# Patient Record
Sex: Male | Born: 1940
Health system: Southern US, Community
[De-identification: ages and names within clinical notes are randomized; demographics above are authoritative.]

## PROBLEM LIST (undated history)

## (undated) DIAGNOSIS — J449 Chronic obstructive pulmonary disease, unspecified: Secondary | ICD-10-CM

## (undated) DIAGNOSIS — I472 Ventricular tachycardia: Secondary | ICD-10-CM

## (undated) DIAGNOSIS — Z95810 Presence of automatic (implantable) cardiac defibrillator: Secondary | ICD-10-CM

## (undated) DIAGNOSIS — E785 Hyperlipidemia, unspecified: Secondary | ICD-10-CM

## (undated) DIAGNOSIS — E1122 Type 2 diabetes mellitus with diabetic chronic kidney disease: Secondary | ICD-10-CM

## (undated) DIAGNOSIS — I252 Old myocardial infarction: Secondary | ICD-10-CM

## (undated) DIAGNOSIS — N529 Male erectile dysfunction, unspecified: Secondary | ICD-10-CM

## (undated) DIAGNOSIS — I10 Essential (primary) hypertension: Secondary | ICD-10-CM

## (undated) DIAGNOSIS — I639 Cerebral infarction, unspecified: Secondary | ICD-10-CM

## (undated) DIAGNOSIS — I480 Paroxysmal atrial fibrillation: Secondary | ICD-10-CM

## (undated) DIAGNOSIS — I5023 Acute on chronic systolic (congestive) heart failure: Secondary | ICD-10-CM

## (undated) DIAGNOSIS — I2510 Atherosclerotic heart disease of native coronary artery without angina pectoris: Secondary | ICD-10-CM

## (undated) DIAGNOSIS — N183 Chronic kidney disease, stage 3 unspecified: Secondary | ICD-10-CM

## (undated) HISTORY — DX: Hyperlipidemia, unspecified: E78.5

## (undated) HISTORY — DX: Presence of automatic (implantable) cardiac defibrillator: Z95.810

## (undated) HISTORY — DX: Cerebral infarction, unspecified: I63.9

## (undated) HISTORY — PX: CHOLECYSTECTOMY: SHX55

## (undated) HISTORY — DX: Chronic kidney disease, stage 3 (moderate): N18.3

## (undated) HISTORY — DX: Old myocardial infarction: I25.2

## (undated) HISTORY — PX: KNEE ARTHROSCOPY: SUR90

## (undated) HISTORY — DX: Paroxysmal atrial fibrillation: I48.0

## (undated) HISTORY — PX: EYE SURGERY: SHX253

## (undated) HISTORY — DX: Chronic obstructive pulmonary disease, unspecified: J44.9

## (undated) HISTORY — PX: BYPASS GRAFT: SHX909

## (undated) HISTORY — DX: Type 2 diabetes mellitus with diabetic chronic kidney disease: E11.22

## (undated) HISTORY — DX: Ventricular tachycardia: I47.2

## (undated) HISTORY — PX: CORONARY ARTERY BYPASS GRAFT: SHX141

## (undated) HISTORY — DX: Essential (primary) hypertension: I10

## (undated) HISTORY — PX: HERNIA REPAIR: SHX51

## (undated) HISTORY — DX: Male erectile dysfunction, unspecified: N52.9

## (undated) HISTORY — DX: Atherosclerotic heart disease of native coronary artery without angina pectoris: I25.10

## (undated) HISTORY — PX: UMBILICAL HERNIA REPAIR: SHX196

## (undated) HISTORY — DX: Acute on chronic systolic (congestive) heart failure: I50.23

## (undated) HISTORY — DX: Chronic kidney disease, stage 3 unspecified: N18.30

## (undated) HISTORY — PX: OTHER SURGICAL HISTORY: SHX169

---

## 2001-12-05 ENCOUNTER — Encounter: Payer: Self-pay | Admitting: Thoracic Surgery (Cardiothoracic Vascular Surgery)

## 2001-12-09 ENCOUNTER — Encounter: Payer: Self-pay | Admitting: Thoracic Surgery (Cardiothoracic Vascular Surgery)

## 2001-12-09 ENCOUNTER — Inpatient Hospital Stay (HOSPITAL_COMMUNITY)
Admission: RE | Admit: 2001-12-09 | Discharge: 2001-12-22 | Payer: Self-pay | Admitting: Thoracic Surgery (Cardiothoracic Vascular Surgery)

## 2001-12-10 ENCOUNTER — Encounter: Payer: Self-pay | Admitting: Thoracic Surgery (Cardiothoracic Vascular Surgery)

## 2001-12-11 ENCOUNTER — Encounter: Payer: Self-pay | Admitting: Thoracic Surgery (Cardiothoracic Vascular Surgery)

## 2001-12-15 ENCOUNTER — Encounter: Payer: Self-pay | Admitting: Thoracic Surgery (Cardiothoracic Vascular Surgery)

## 2001-12-17 ENCOUNTER — Encounter: Payer: Self-pay | Admitting: Thoracic Surgery (Cardiothoracic Vascular Surgery)

## 2002-01-12 ENCOUNTER — Encounter
Admission: RE | Admit: 2002-01-12 | Discharge: 2002-01-12 | Payer: Self-pay | Admitting: Thoracic Surgery (Cardiothoracic Vascular Surgery)

## 2002-01-12 ENCOUNTER — Encounter: Payer: Self-pay | Admitting: Thoracic Surgery (Cardiothoracic Vascular Surgery)

## 2006-01-08 HISTORY — PX: CARDIAC DEFIBRILLATOR PLACEMENT: SHX171

## 2006-01-29 ENCOUNTER — Ambulatory Visit (HOSPITAL_COMMUNITY): Admission: RE | Admit: 2006-01-29 | Discharge: 2006-01-30 | Payer: Self-pay | Admitting: Orthopedic Surgery

## 2006-12-23 HISTORY — PX: COLONOSCOPY: SHX174

## 2010-10-23 ENCOUNTER — Ambulatory Visit (INDEPENDENT_AMBULATORY_CARE_PROVIDER_SITE_OTHER): Payer: Medicare HMO | Admitting: Cardiology

## 2010-10-23 ENCOUNTER — Encounter: Payer: Self-pay | Admitting: Cardiology

## 2010-10-23 VITALS — BP 132/74 | HR 63 | Ht 70.0 in | Wt 214.8 lb

## 2010-10-23 DIAGNOSIS — I5023 Acute on chronic systolic (congestive) heart failure: Secondary | ICD-10-CM

## 2010-10-23 DIAGNOSIS — I252 Old myocardial infarction: Secondary | ICD-10-CM | POA: Insufficient documentation

## 2010-10-23 DIAGNOSIS — I2510 Atherosclerotic heart disease of native coronary artery without angina pectoris: Secondary | ICD-10-CM

## 2010-10-23 DIAGNOSIS — Z95810 Presence of automatic (implantable) cardiac defibrillator: Secondary | ICD-10-CM | POA: Insufficient documentation

## 2010-10-23 DIAGNOSIS — E1121 Type 2 diabetes mellitus with diabetic nephropathy: Secondary | ICD-10-CM | POA: Insufficient documentation

## 2010-10-23 DIAGNOSIS — I509 Heart failure, unspecified: Secondary | ICD-10-CM

## 2010-10-23 DIAGNOSIS — I10 Essential (primary) hypertension: Secondary | ICD-10-CM | POA: Insufficient documentation

## 2010-10-23 DIAGNOSIS — E119 Type 2 diabetes mellitus without complications: Secondary | ICD-10-CM

## 2010-10-23 MED ORDER — LISINOPRIL-HYDROCHLOROTHIAZIDE 10-12.5 MG PO TABS: 1.0000 | ORAL_TABLET | Freq: Every day | ORAL | Status: DC

## 2010-10-23 MED ORDER — OMEPRAZOLE 20 MG PO CPDR: 20.0000 mg | DELAYED_RELEASE_CAPSULE | Freq: Every day | ORAL | Status: DC

## 2010-10-23 MED ORDER — FUROSEMIDE 40 MG PO TABS: 40.0000 mg | ORAL_TABLET | Freq: Two times a day (BID) | ORAL | Status: DC

## 2010-10-23 MED ORDER — CARVEDILOL 12.5 MG PO TABS: 12.5000 mg | ORAL_TABLET | Freq: Two times a day (BID) | ORAL | Status: DC

## 2010-10-23 NOTE — Assessment & Plan Note (Signed)
His ICD was implanted on 12/15/2007. 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 Since he is planning to have his cardiology followup here we will establish him in our ICD clinic. It appears that this is a dual-chamber device without biventricular pacing.

## 2010-10-23 NOTE — Progress Notes (Signed)
Nathaniel Bolton Date of Birth: 1940-03-31   History of Present Illness: Nathaniel Bolton is seen at the request of Dr. Tobie Poet for evaluation of increased dyspnea. He is a 70 year old white male with a complex cardiac history. He reports a history of 2 prior myocardial infarctions in 1980 and 1990. He underwent coronary bypass surgery x5 approximately 9 years ago by Dr. Ricard Dillon. He is status post ICD implant in December of 2009 for prophylaxis. He reports an ejection fraction of 25-30%. He formerly was followed by Dr. Woody Seller in [place] but has not had regular cardiac followup. His ICD has been followed by Dr. Elonda Husky in [facility]. Over the past 4-5 months he has had symptoms of increased shortness of breath with exertion. Patient really downplays these symptoms but his wife reports significant increase in shortness of breath and lower extremity edema. He has had increased difficulty doing yard work and has to stop frequently. He denies any orthopnea or PND. He denies any chest pain or palpitations. He's had no defibrillator discharges. He has checked his oxygen levels at home and they have been anywhere from 90-95%.  Current Outpatient Prescriptions on File Prior to Visit  Medication Sig Dispense Refill  . aspirin 81 MG tablet Take 81 mg by mouth daily.        . Cholecalciferol (VITAMIN D PO) Take by mouth.        Marland Kitchen glipiZIDE (GLUCOTROL) 10 MG tablet Take 10 mg by mouth 2 (two) times daily before a meal.        . insulin aspart protamine-insulin aspart (NOVOLOG 70/30) (70-30) 100 UNIT/ML injection Inject into the skin.        Marland Kitchen isosorbide mononitrate (IMDUR) 30 MG 24 hr tablet Take 1 tablet by mouth Daily.      . Omega-3 Fatty Acids (FISH OIL PO) Take by mouth.        . Saw Palmetto, Serenoa repens, (SAW PALMETTO PO) Take by mouth.        . simvastatin (ZOCOR) 40 MG tablet Take 40 mg by mouth at bedtime.        . Vitamin D, Ergocalciferol, (DRISDOL) 50000 UNITS CAPS Take 1 tablet by mouth Daily.      Marland Kitchen  VITAMIN E PO Take by mouth.        . carvedilol (COREG) 12.5 MG tablet Take 1 tablet (12.5 mg total) by mouth 2 (two) times daily.  30 tablet  11  . lisinopril-hydrochlorothiazide (PRINZIDE,ZESTORETIC) 10-12.5 MG per tablet Take 1 tablet by mouth daily.      Marland Kitchen omeprazole (PRILOSEC) 20 MG capsule Take 1 capsule (20 mg total) by mouth daily.        Allergies  Allergen Reactions  . Adhesive (Tape)     Testosterone patch adhesive    Past Medical History  Diagnosis Date  . Heart attack     x2  . Diabetes mellitus     Type 2  . Hypertension   . CAD (coronary artery disease)   . Erectile dysfunction   . Gout   . CHF (congestive heart failure)     Past Surgical History  Procedure Date  . Bypass graft     x5  . Cardiac defibrillator placement 2008  . Cholecystectomy   . Knee arthroscopy     right  . Umbilical hernia repair   . Cataract surgery     History  Smoking status  . Former Smoker  Smokeless tobacco  . Not on file  History  Alcohol Use No    Family History  Problem Relation Age of Onset  . Heart failure Mother   . Heart attack Father   . Heart disease Sister     valve replaced  . Heart attack Brother     CABG, aortic grafting    Review of Systems: The review of systems is positive for  Increased weight gain of approximately 10 pounds. He quit smoking in 1992.  He has no history of stroke or TIA. He has no history of bleeding problems. All other systems were reviewed and are negative.  Physical Exam: BP 132/74  Pulse 63  Ht 5\' 10"  (1.778 m)  Wt 214 lb 12.8 oz (97.433 kg)  BMI 30.82 kg/m2 He is an obese white male in no acute distress. He is normocephalic, atraumatic. Pupils are equal round and reactive to light and accommodation. Sclera are clear. Oropharynx is clear. Neck is supple without adenopathy or thyromegaly. He has mild jugular venous distention. Carotid upstrokes are normal without bruits. Lungs reveal mild basilar rales on the left.  Cardiovascular exam reveals a regular rate and rhythm with normal S1 and S2. There are no gallops, murmurs, or clicks. PMI is normal. He has a median sternotomy scar. Abdomen is obese, soft, nontender. He has no masses organosplenomegaly. His femoral and pedal pulses are palpable. He has 1-2+ edema left greater than right. He has a scar on his left leg from vein graft harvest. He also has a scar in his left wrist from a radial artery graft. He is alert and oriented x3. Cranial nerves II through XII are intact. He has no focal motor or sensory deficits. LABORATORY DATA: ECG demonstrates normal sinus rhythm with evidence of old inferior infarction. There is T wave inversion in the lateral leads consistent with ischemia. Prior pulmonary function studies were reported as showing moderate obstructive lung disease. Recent lab work is pending.  Assessment / Plan:

## 2010-10-23 NOTE — Assessment & Plan Note (Signed)
He has signs and symptoms of congestive heart failure with increased weight gain, edema, and dyspnea. We will increase his Lasix to 40 mg twice daily. I've recommended sodium restriction of less than 2 g per day. We will obtain an echocardiogram. We will followup on his recent laboratory data. I will have him return in 2 weeks to assess his response to diuretics and we will repeat a basic metabolic panel and BNP level at that time. He apparently has a history of elevated potassium which would negate our use of Aldactone.

## 2010-10-23 NOTE — Assessment & Plan Note (Signed)
He is status post CABG approximately 9 years ago. We will need to obtain records from his cardiac catheterization and bypass at that time. This apparently was done at [facility]. Given his recent symptoms of increased dyspnea on exertion we need to rule out anginal equivalent symptoms and we'll schedule him for a lexiscan Myoview study.

## 2010-10-23 NOTE — Patient Instructions (Signed)
You need to restrict salt in your diet.  We will increase you Lasix to 40 mg twice a day.  Continue your other medications.  We will schedule you for an echocardiogram and a nuclear stress test.  We will see you again in 2 weeks with blood work (BMET, BNP).  We will call for the results of your lab work from Dr. Tobie Poet  We will schedule follow up in our ICD clinic.

## 2010-10-30 ENCOUNTER — Encounter: Payer: Self-pay | Admitting: *Deleted

## 2010-11-02 ENCOUNTER — Ambulatory Visit (HOSPITAL_BASED_OUTPATIENT_CLINIC_OR_DEPARTMENT_OTHER): Payer: Medicare HMO | Admitting: Radiology

## 2010-11-02 ENCOUNTER — Ambulatory Visit (INDEPENDENT_AMBULATORY_CARE_PROVIDER_SITE_OTHER): Payer: Medicare HMO | Admitting: *Deleted

## 2010-11-02 ENCOUNTER — Encounter: Payer: Self-pay | Admitting: Internal Medicine

## 2010-11-02 ENCOUNTER — Ambulatory Visit (HOSPITAL_COMMUNITY): Payer: Medicare HMO | Attending: Cardiology | Admitting: Radiology

## 2010-11-02 VITALS — Ht 70.0 in | Wt 213.0 lb

## 2010-11-02 DIAGNOSIS — J449 Chronic obstructive pulmonary disease, unspecified: Secondary | ICD-10-CM | POA: Insufficient documentation

## 2010-11-02 DIAGNOSIS — I509 Heart failure, unspecified: Secondary | ICD-10-CM | POA: Insufficient documentation

## 2010-11-02 DIAGNOSIS — I2510 Atherosclerotic heart disease of native coronary artery without angina pectoris: Secondary | ICD-10-CM | POA: Insufficient documentation

## 2010-11-02 DIAGNOSIS — R0989 Other specified symptoms and signs involving the circulatory and respiratory systems: Secondary | ICD-10-CM | POA: Insufficient documentation

## 2010-11-02 DIAGNOSIS — I252 Old myocardial infarction: Secondary | ICD-10-CM | POA: Insufficient documentation

## 2010-11-02 DIAGNOSIS — R0602 Shortness of breath: Secondary | ICD-10-CM

## 2010-11-02 DIAGNOSIS — J4489 Other specified chronic obstructive pulmonary disease: Secondary | ICD-10-CM | POA: Insufficient documentation

## 2010-11-02 DIAGNOSIS — Z95810 Presence of automatic (implantable) cardiac defibrillator: Secondary | ICD-10-CM

## 2010-11-02 DIAGNOSIS — I5023 Acute on chronic systolic (congestive) heart failure: Secondary | ICD-10-CM

## 2010-11-02 DIAGNOSIS — R0609 Other forms of dyspnea: Secondary | ICD-10-CM | POA: Insufficient documentation

## 2010-11-02 DIAGNOSIS — Z87891 Personal history of nicotine dependence: Secondary | ICD-10-CM | POA: Insufficient documentation

## 2010-11-02 DIAGNOSIS — I428 Other cardiomyopathies: Secondary | ICD-10-CM

## 2010-11-02 DIAGNOSIS — I059 Rheumatic mitral valve disease, unspecified: Secondary | ICD-10-CM | POA: Insufficient documentation

## 2010-11-02 DIAGNOSIS — E119 Type 2 diabetes mellitus without complications: Secondary | ICD-10-CM | POA: Insufficient documentation

## 2010-11-02 DIAGNOSIS — I10 Essential (primary) hypertension: Secondary | ICD-10-CM | POA: Insufficient documentation

## 2010-11-02 DIAGNOSIS — I25810 Atherosclerosis of coronary artery bypass graft(s) without angina pectoris: Secondary | ICD-10-CM

## 2010-11-02 LAB — ICD DEVICE OBSERVATION
BATTERY VOLTAGE: 3.147 V
BRDY-0002RV: 40 {beats}/min
PACEART VT: 0
TOT-0006: 20091217000000
TZAT-0002SLOWVT: NEGATIVE
TZAT-0012FASTVT: 200 ms
TZAT-0018FASTVT: NEGATIVE
TZAT-0018SLOWVT: NEGATIVE
TZAT-0019FASTVT: 8 V
TZAT-0019SLOWVT: 8 V
TZAT-0020FASTVT: 1.5 ms
TZST-0001FASTVT: 5
TZST-0001SLOWVT: 3
TZST-0001SLOWVT: 4
TZST-0001SLOWVT: 5
TZST-0001SLOWVT: 6
TZST-0002FASTVT: NEGATIVE
TZST-0002FASTVT: NEGATIVE
TZST-0002SLOWVT: NEGATIVE
TZST-0002SLOWVT: NEGATIVE
VENTRICULAR PACING ICD: 0.08 pct

## 2010-11-02 MED ORDER — TECHNETIUM TC 99M TETROFOSMIN IV KIT
33.0000 | PACK | Freq: Once | INTRAVENOUS | Status: AC | PRN
  Administered 2010-11-02: 33 via INTRAVENOUS

## 2010-11-02 MED ORDER — TECHNETIUM TC 99M TETROFOSMIN IV KIT
11.0000 | PACK | Freq: Once | INTRAVENOUS | Status: AC | PRN
  Administered 2010-11-02: 11 via INTRAVENOUS

## 2010-11-02 MED ORDER — REGADENOSON 0.4 MG/5ML IV SOLN
0.4000 mg | Freq: Once | INTRAVENOUS | Status: AC
  Administered 2010-11-02: 0.4 mg via INTRAVENOUS

## 2010-11-02 NOTE — Progress Notes (Signed)
[place] [address] [phone]  Cardiology Nuclear Med Study  ADANTE HUSSER is a 70 y.o. male [id number] February 07, 1940   Nuclear Med Background Indication for Stress Test:  Evaluation for Ischemia and Graft Patency History: 12/03 CABGx5, 12/09 Defibrillator: ICM, '03 Heart Catheterization: EF 25% Severe 2V Dz and '80, '90 Myocardial Infarction Cardiac Risk Factors: Family History - CAD, History of Smoking, Hypertension and IDDM Type 2  Symptoms:  DOE, Fatigue with Exertion and SOB   Nuclear Pre-Procedure Caffeine/Decaff Intake:  None NPO After: 8:00pm   Lungs:  clear IV 0.9% NS with Angio Cath:  22g  IV Site: R Wrist  IV Started by:  Eliezer Lofts, EMT-P  Chest Size (in):  43 Cup Size: n/a  Height: 5\' 10"  (1.778 m)  Weight:  213 lb (96.616 kg)  BMI:  Body mass index is 30.56 kg/(m^2). Tech Comments:  Coreg held this am, per patient.    Nuclear Med Study 1 or 2 day study: 1 day  Stress Test Type:  Carlton Adam  Reading MD: Jenkins Rouge, MD  Order Authorizing Provider:  P.Jordan  Resting Radionuclide: Technetium 4m Tetrofosmin  Resting Radionuclide Dose: 11.0 mCi   Stress Radionuclide:  Technetium 39m Tetrofosmin  Stress Radionuclide Dose: 33.0 mCi           Stress Protocol Rest HR: 54 Stress HR: 70  Rest BP: 93/63 Stress BP: 108/70  Exercise Time (min): n/a METS: n/a   Predicted Max HR: 152 bpm % Max HR: 46.05 bpm Rate Pressure Product: 7560   Dose of Adenosine (mg):  n/a Dose of Lexiscan: 0.4 mg  Dose of Atropine (mg): n/a Dose of Dobutamine: n/a mcg/kg/min (at max HR)  Stress Test Technologist: Perrin Maltese, EMT-P  Nuclear Technologist:  Charlton Amor, CNMT     Rest Procedure:  Myocardial perfusion imaging was performed at rest 45 minutes following the intravenous administration of Technetium 27m Tetrofosmin. Rest ECG: NSR  Stress Procedure:  The patient received IV Lexiscan 0.4 mg over  15-seconds.  Technetium 40m Tetrofosmin injected at 30-seconds.  There were no significant changes, sob, fatigue, and rare pvcs with Lexiscan.  Quantitative spect images were obtained after a 45 minute delay. Stress ECG: No significant change from baseline ECG  QPS Raw Data Images:  Patient motion noted. Stress Images:  There is decreased uptake in the inferior wall. Rest Images:  There is decreased uptake in the inferior wall. Subtraction (SDS):  There is a fixed defect that is most consistent with a previous infarction. Transient Ischemic Dilatation (Normal <1.22):  1.10 Lung/Heart Ratio (Normal <0.45):  0.40  Quantitative Gated Spect Images QGS EDV:  206 ml QGS ESV:  151 ml QGS cine images:  Inferior and lateral wall hypokinesis QGS EF: 27%  Impression Exercise Capacity:  Lexiscan with no exercise. BP Response:  Normal blood pressure response. Clinical Symptoms:  There is dyspnea. ECG Impression:  No significant ST segment change suggestive of ischemia. Comparison with Prior Nuclear Study: No images to compare  Overall Impression:  Large inferior and lateral wall infarcts from apex to base.  No ischeia  EF 27%     Jenkins Rouge

## 2010-11-02 NOTE — Progress Notes (Signed)
icd check in clinic  

## 2010-11-06 ENCOUNTER — Ambulatory Visit (INDEPENDENT_AMBULATORY_CARE_PROVIDER_SITE_OTHER): Payer: Medicare HMO | Admitting: Nurse Practitioner

## 2010-11-06 ENCOUNTER — Other Ambulatory Visit (INDEPENDENT_AMBULATORY_CARE_PROVIDER_SITE_OTHER): Payer: Medicare HMO | Admitting: *Deleted

## 2010-11-06 ENCOUNTER — Encounter: Payer: Self-pay | Admitting: Nurse Practitioner

## 2010-11-06 DIAGNOSIS — R0609 Other forms of dyspnea: Secondary | ICD-10-CM

## 2010-11-06 DIAGNOSIS — I5022 Chronic systolic (congestive) heart failure: Secondary | ICD-10-CM | POA: Insufficient documentation

## 2010-11-06 DIAGNOSIS — I2510 Atherosclerotic heart disease of native coronary artery without angina pectoris: Secondary | ICD-10-CM

## 2010-11-06 DIAGNOSIS — I5020 Unspecified systolic (congestive) heart failure: Secondary | ICD-10-CM

## 2010-11-06 DIAGNOSIS — I509 Heart failure, unspecified: Secondary | ICD-10-CM

## 2010-11-06 DIAGNOSIS — R0989 Other specified symptoms and signs involving the circulatory and respiratory systems: Secondary | ICD-10-CM

## 2010-11-06 LAB — BASIC METABOLIC PANEL
BUN: 37 mg/dL — ABNORMAL HIGH (ref 6–23)
Chloride: 97 mEq/L (ref 96–112)
Potassium: 3.8 mEq/L (ref 3.5–5.1)
Sodium: 135 mEq/L (ref 135–145)

## 2010-11-06 MED ORDER — LISINOPRIL 10 MG PO TABS: 10.0000 mg | ORAL_TABLET | Freq: Two times a day (BID) | ORAL | Status: DC

## 2010-11-06 NOTE — Assessment & Plan Note (Signed)
His Myoview shows large inferior and lateral wall infarcts from the apex to the base. No ischemia. EF is 27%. Echo shows no valvular disease but with EF of 25 to 30%. Symptoms are improved with diuresis.

## 2010-11-06 NOTE — Assessment & Plan Note (Signed)
Has an EF in the 25 to 30% range. I have stopped the Lisinopril Hct and placed him on Lisinopril 10 mg BID. We will be checking BMET and BNP today. I have left him on his Lasix BID. I will see him back in 3 months. Will try to continue with uptitration of his medicines. He has apparently had hyperkalemia in the past which may limit trying aldactone. He has also been using some potassium supplements as well. He is encouraged to weigh daily and continue with salt restriction. Patient is agreeable to this plan and will call if any problems develop in the interim.

## 2010-11-06 NOTE — Progress Notes (Signed)
Nathaniel Bolton Date of Birth: [birth year] Medical Record [id number]  History of Present Illness: Nathaniel Bolton is seen today for a follow up visit. He is seen for Dr. Martinique. He has a complex medical history with an ischemia cardiomyopathy, EF of 27%. He has his ICD in place. He has been placed in the device clinic here. He has had his myoview and echo updated. EF remains low. No ischemia on the myoview but with significant scar.   His Lasix was increased at his last visit. His wife says this has helped a lot. He tends to downplay his symptoms. She notes less shortness of breath and less edema. He has complained of some cramps and has used some potassium. He remains very active with doing yard work. No ICD shocks. No chest pain. He is trying to watch his salt.   Current Outpatient Prescriptions on File Prior to Visit  Medication Sig Dispense Refill  . aspirin 81 MG tablet Take 81 mg by mouth daily.        . carvedilol (COREG) 12.5 MG tablet Take 1 tablet (12.5 mg total) by mouth 2 (two) times daily.  30 tablet  11  . furosemide (LASIX) 40 MG tablet Take 1 tablet (40 mg total) by mouth 2 (two) times daily.  60 tablet  11  . glipiZIDE (GLUCOTROL) 10 MG tablet Take 10 mg by mouth 2 (two) times daily before a meal.        . insulin aspart protamine-insulin aspart (NOVOLOG 70/30) (70-30) 100 UNIT/ML injection Inject into the skin.        Marland Kitchen isosorbide mononitrate (IMDUR) 30 MG 24 hr tablet Take 1 tablet by mouth Daily.      . Multiple Vitamin (MULTIVITAMIN) tablet Take 1 tablet by mouth daily.        . Omega-3 Fatty Acids (FISH OIL PO) Take by mouth.        Marland Kitchen omeprazole (PRILOSEC) 20 MG capsule Take 1 capsule (20 mg total) by mouth daily.      . Potassium 99 MG TABS Take 1 tablet by mouth daily as needed.        . Saw Palmetto, Serenoa repens, (SAW PALMETTO PO) Take by mouth.        . simvastatin (ZOCOR) 80 MG tablet Take 80 mg by mouth at bedtime.        . Vitamin D, Ergocalciferol, (DRISDOL)  50000 UNITS CAPS Take 1 tablet by mouth every 7 (seven) days.       Marland Kitchen VITAMIN E PO Take by mouth.          Allergies  Allergen Reactions  . Adhesive (Tape)     Testosterone patch adhesive    Past Medical History  Diagnosis Date  . Myocardial infarction, old     x2 in [place]. S/P CABG approx 9 years ago per Dr. Ricard Dillon  . Diabetes mellitus     Type 2  . Hypertension   . CAD (coronary artery disease)   . Erectile dysfunction   . Gout   . Systolic CHF, acute on chronic     EF is 27% per myoview and echo October 2012  . ICD (implantable cardiac defibrillator) in place   . Hyperlipidemia   . Obstructive lung disease     Moderate per prior PFT's    Past Surgical History  Procedure Date  . Bypass graft     x5  . Cardiac defibrillator placement 2008  . Cholecystectomy   .  Knee arthroscopy     right  . Umbilical hernia repair   . Cataract surgery     History  Smoking status  . Former Smoker  Smokeless tobacco  . Not on file    History  Alcohol Use No    Family History  Problem Relation Age of Onset  . Heart failure Mother   . Heart attack Father   . Heart disease Sister     valve replaced  . Heart attack Brother     CABG, aortic grafting    Review of Systems: The review of systems is positive for edema but it is improving No orthopnea or PND.  He will apparently be getting an insulin pump in the near future. All other systems were reviewed and are negative.  Physical Exam: BP 122/68  Pulse 68  Ht 5\' 10"  (1.778 m)  Wt 213 lb 1.9 oz (96.671 kg)  BMI 30.58 kg/m2 Patient is very pleasant and in no acute distress. Skin is warm and dry. Color is normal.  HEENT is unremarkable. Normocephalic/atraumatic. PERRL. Sclera are nonicteric. Neck is supple. No masses. No JVD. Lungs are clear. Cardiac exam shows a regular rate and rhythm. No S3. Abdomen is obese but soft. Extremities are with just trace edema. Gait and ROM are intact. No gross neurologic deficits  noted.   LABORATORY DATA: BMET and BNP are pending.   Assessment / Plan:

## 2010-11-06 NOTE — Patient Instructions (Signed)
Stop your Lisinipril HCT.  Start Lisinopril 10 mg two times a day.  Stay on your other medicines.  Watch your salt.  Weigh every day.   We will check your labs today.  I will see you in about 3 weeks. We will check lab that day. You do not need to fast.

## 2010-11-06 NOTE — Progress Notes (Signed)
lm

## 2010-11-07 ENCOUNTER — Telehealth: Payer: Self-pay | Admitting: Cardiology

## 2010-11-07 NOTE — Telephone Encounter (Signed)
Pt and wife were notified of echo results.

## 2010-11-07 NOTE — Telephone Encounter (Signed)
Or [phone], pt's wife calling re echo results

## 2010-11-13 ENCOUNTER — Telehealth: Payer: Self-pay | Admitting: *Deleted

## 2010-11-13 NOTE — Telephone Encounter (Signed)
Notified of stress test results. Will send to Dr. Tobie Poet

## 2010-11-13 NOTE — Telephone Encounter (Signed)
Notified of Echo results. Will send copy to Dr. Tobie Poet

## 2010-11-13 NOTE — Telephone Encounter (Signed)
Notified of lab results. Will send to Dr. Tobie Poet

## 2010-11-27 ENCOUNTER — Ambulatory Visit (INDEPENDENT_AMBULATORY_CARE_PROVIDER_SITE_OTHER): Payer: Medicare HMO | Admitting: Nurse Practitioner

## 2010-11-27 ENCOUNTER — Encounter: Payer: Self-pay | Admitting: Nurse Practitioner

## 2010-11-27 DIAGNOSIS — I10 Essential (primary) hypertension: Secondary | ICD-10-CM

## 2010-11-27 DIAGNOSIS — I5020 Unspecified systolic (congestive) heart failure: Secondary | ICD-10-CM

## 2010-11-27 DIAGNOSIS — I2510 Atherosclerotic heart disease of native coronary artery without angina pectoris: Secondary | ICD-10-CM

## 2010-11-27 DIAGNOSIS — J069 Acute upper respiratory infection, unspecified: Secondary | ICD-10-CM | POA: Insufficient documentation

## 2010-11-27 LAB — BASIC METABOLIC PANEL
BUN: 40 mg/dL — ABNORMAL HIGH (ref 6–23)
CO2: 28 mEq/L (ref 19–32)
Calcium: 8.8 mg/dL (ref 8.4–10.5)
Chloride: 101 mEq/L (ref 96–112)
Creatinine, Ser: 1.5 mg/dL (ref 0.4–1.5)
GFR: 48.56 mL/min — ABNORMAL LOW (ref >60.00)
Glucose, Bld: 112 mg/dL — ABNORMAL HIGH (ref 70–99)
Potassium: 4.2 mEq/L (ref 3.5–5.1)
Sodium: 137 mEq/L (ref 135–145)

## 2010-11-27 NOTE — Assessment & Plan Note (Signed)
Blood pressure is good. No change in his medicines at this time.

## 2010-11-27 NOTE — Assessment & Plan Note (Signed)
EF is 27% per myoview. He is on ACE/beta blocker and diuretic. Encouraged him to weigh every day and to take his medicines as prescribed. We will recheck a BMET today. Will see what his potassium is today. If on the low side I think we could give him a trial of Aldactone and monitor closely. He is currently not symptomatic and looks compensated at this time. We will see him back in about 6 weeks. Patient is agreeable to this plan and will call if any problems develop in the interim.

## 2010-11-27 NOTE — Progress Notes (Signed)
Nathaniel Bolton Date of Birth: [birth year] Medical Record [id number]  History of Present Illness: [name] is seen back today for a 3 week check. He is seen for Dr. Martinique. He is here with his wife. She tells most of the history. He has an ischemic cardiomyopathy with an EF of 27%. ICD is in place. We have stopped his HCTZ and increased his Lisinopril. There has been concern in the past for hyperkalemia which may make trying to add aldactone difficult.   He has had a cold. It is improving. Some yellow sputum. Initially had chills. No fever. Says he is getting better. No chest pain. No dizziness. Wife notes that he missed several of his Lasix tablets. He has had some swelling. Weights at home are questionable. He has difficulty seeing the readout. He had lab last week and was told that his potassium was low and that he may need a prescription. He is using only the over the counter supplement at this time. He takes that if he has cramps.   Current Outpatient Prescriptions on File Prior to Visit  Medication Sig Dispense Refill  . aspirin 81 MG tablet Take 81 mg by mouth daily.        . carvedilol (COREG) 12.5 MG tablet Take 1 tablet (12.5 mg total) by mouth 2 (two) times daily.  30 tablet  11  . furosemide (LASIX) 40 MG tablet Take 1 tablet (40 mg total) by mouth 2 (two) times daily.  60 tablet  11  . glipiZIDE (GLUCOTROL) 10 MG tablet Take 10 mg by mouth 2 (two) times daily before a meal.        . insulin aspart protamine-insulin aspart (NOVOLOG 70/30) (70-30) 100 UNIT/ML injection Inject into the skin.        Marland Kitchen isosorbide mononitrate (IMDUR) 30 MG 24 hr tablet Take 1 tablet by mouth Daily.      Marland Kitchen lisinopril (PRINIVIL,ZESTRIL) 10 MG tablet Take 1 tablet (10 mg total) by mouth 2 (two) times daily.  60 tablet  11  . Multiple Vitamin (MULTIVITAMIN) tablet Take 1 tablet by mouth daily.        . Omega-3 Fatty Acids (FISH OIL PO) Take by mouth.        Marland Kitchen omeprazole (PRILOSEC) 20 MG capsule Take 1  capsule (20 mg total) by mouth daily.      . Potassium 99 MG TABS Take 1 tablet by mouth daily as needed.        . Saw Palmetto, Serenoa repens, (SAW PALMETTO PO) Take by mouth.        . simvastatin (ZOCOR) 80 MG tablet Take 80 mg by mouth at bedtime.        . Vitamin D, Ergocalciferol, (DRISDOL) 50000 UNITS CAPS Take 1 tablet by mouth every 7 (seven) days.       Marland Kitchen VITAMIN E PO Take by mouth.          Allergies  Allergen Reactions  . Adhesive (Tape)     Testosterone patch adhesive    Past Medical History  Diagnosis Date  . Myocardial infarction, old     x2 in [place]. S/P CABG approx 9 years ago per Dr. Ricard Dillon  . Diabetes mellitus     Type 2  . Hypertension   . CAD (coronary artery disease)   . Erectile dysfunction   . Gout   . Systolic CHF, acute on chronic     EF is 27% per myoview and echo  October 2012  . ICD (implantable cardiac defibrillator) in place   . Hyperlipidemia   . Obstructive lung disease     Moderate per prior PFT's    Past Surgical History  Procedure Date  . Bypass graft     x5  . Cardiac defibrillator placement 2008  . Cholecystectomy   . Knee arthroscopy     right  . Umbilical hernia repair   . Cataract surgery     History  Smoking status  . Former Smoker  Smokeless tobacco  . Not on file    History  Alcohol Use No    Family History  Problem Relation Age of Onset  . Heart failure Mother   . Heart attack Father   . Heart disease Sister     valve replaced  . Heart attack Brother     CABG, aortic grafting    Review of Systems: The review of systems is positive for URI that is improving. He is using Mucinex.  All other systems were reviewed and are negative.  Physical Exam: BP 100/52  Pulse 76  Ht 5\' 10"  (1.778 m)  Wt 215 lb 12.8 oz (97.886 kg)  BMI 30.96 kg/m2 Patient is very pleasant and in no acute distress. Skin is warm and dry. Color is normal.  HEENT is unremarkable. Normocephalic/atraumatic. PERRL. Sclera are  nonicteric. Neck is supple. No masses. No JVD. Lungs are clear. Cardiac exam shows a regular rate and rhythm. Abdomen is soft. Extremities are with just trace edema. Gait and ROM are intact. No gross neurologic deficits noted.   LABORATORY DATA: BMET is pending   Assessment / Plan:

## 2010-11-27 NOTE — Assessment & Plan Note (Signed)
This seems to be getting better. He may use plain Mucinex or Coricidin products.

## 2010-11-27 NOTE — Assessment & Plan Note (Signed)
Myoview showed large inferior and lateral wall infarcts from the apex to the base. No ischemia. EF is 27%. Currently without symptoms.

## 2010-11-27 NOTE — Patient Instructions (Signed)
You may use plain Mucinex or the Coricidin products for your cold.  Lets check your potassium level again today. That will tell us if we can start another medicine for your heart failure.  Continue with your current medicines. Weigh yourself each morning and record. Take extra dose of diuretic for weight gain of 3 pounds in 24 hours.   Limit sodium intake. Goal is to have less than 2000 mg (2gm) of salt per day.  Call the [facility] [place] office at [phone] if you have any questions, problems or concerns.

## 2010-12-05 ENCOUNTER — Encounter: Payer: Self-pay | Admitting: Cardiology

## 2011-01-03 ENCOUNTER — Telehealth: Payer: Self-pay | Admitting: Cardiology

## 2011-01-03 NOTE — Telephone Encounter (Signed)
New Problem:   PAtient's wife is calling today because he is retaining fluid, his legs are swollen, his stomach is swelling, he is blowing air out of his mouth, seems lethargic and is taking excessive naps.  He was told by Truitt Merle to take an extra fluid pill a day and he will not listen to his wife about taking his medication.  She is also concerned about his potassium pills.

## 2011-01-05 NOTE — Telephone Encounter (Signed)
Have tried multiple times on 01/03/11, 01/04/11, and today 01/05/11 to get in touch w/Nathaniel Bolton. The phone number has been "busy". Have even tried home # [phone] multiple times and line still busy.

## 2011-01-17 ENCOUNTER — Ambulatory Visit: Payer: Medicare HMO | Admitting: Cardiology

## 2011-02-08 ENCOUNTER — Encounter: Payer: Self-pay | Admitting: Internal Medicine

## 2011-02-08 ENCOUNTER — Ambulatory Visit (INDEPENDENT_AMBULATORY_CARE_PROVIDER_SITE_OTHER): Payer: Medicare HMO | Admitting: Internal Medicine

## 2011-02-08 DIAGNOSIS — I255 Ischemic cardiomyopathy: Secondary | ICD-10-CM | POA: Insufficient documentation

## 2011-02-08 DIAGNOSIS — I2510 Atherosclerotic heart disease of native coronary artery without angina pectoris: Secondary | ICD-10-CM

## 2011-02-08 DIAGNOSIS — I10 Essential (primary) hypertension: Secondary | ICD-10-CM

## 2011-02-08 DIAGNOSIS — I428 Other cardiomyopathies: Secondary | ICD-10-CM

## 2011-02-08 DIAGNOSIS — I5020 Unspecified systolic (congestive) heart failure: Secondary | ICD-10-CM

## 2011-02-08 LAB — ICD DEVICE OBSERVATION
BATTERY VOLTAGE: 3.1278 V
CHARGE TIME: 10.089 s
DEV-0020ICD: NEGATIVE
PACEART VT: 0
RV LEAD AMPLITUDE: 6 mv
RV LEAD IMPEDENCE ICD: 342 Ohm
TOT-0002: 0
TOT-0006: 20091217000000
TZAT-0001SLOWVT: 1
TZAT-0002FASTVT: NEGATIVE
TZAT-0002SLOWVT: NEGATIVE
TZAT-0012SLOWVT: 200 ms
TZAT-0019FASTVT: 8 V
TZAT-0020SLOWVT: 1.5 ms
TZON-0003SLOWVT: 360 ms
TZST-0001FASTVT: 2
TZST-0001FASTVT: 4
TZST-0001FASTVT: 5
TZST-0001FASTVT: 6
TZST-0001SLOWVT: 3
TZST-0001SLOWVT: 4
TZST-0001SLOWVT: 5
TZST-0002FASTVT: NEGATIVE
TZST-0002SLOWVT: NEGATIVE
TZST-0002SLOWVT: NEGATIVE
TZST-0002SLOWVT: NEGATIVE

## 2011-02-08 NOTE — Assessment & Plan Note (Signed)
Stable No change required today  

## 2011-02-08 NOTE — Assessment & Plan Note (Signed)
No ischemic symptoms No changes today 

## 2011-02-08 NOTE — Assessment & Plan Note (Addendum)
Normal ICD function See Claudia Desanctis Art report No changes today   Carelink transmissions every 3 months I will see again in 1 year

## 2011-02-08 NOTE — Patient Instructions (Signed)
Your physician wants you to follow-up in: 12 months with Dr Vallery Ridge will receive a reminder letter in the mail two months in advance. If you don't receive a letter, please call our office to schedule the follow-up appointment.   Remote monitoring is used to monitor your Pacemaker of ICD from home. This monitoring reduces the number of office visits required to check your device to one time per year. It allows [place] to keep an eye on the functioning of your device to ensure it is working properly. You are scheduled for a device check from home on 05/10/11. You may send your transmission at any time that day. If you have a wireless device, the transmission will be sent automatically. After your physician reviews your transmission, you will receive a postcard with your next transmission date.

## 2011-02-08 NOTE — Progress Notes (Signed)
[name], MD, MD:PCP Primary Cardiologist:  Dr [place]  Nathaniel Bolton is a 71 y.o. male with a h/o ischemic CM sp ICD (MDT) by Dr Elonda Husky at [facility] for primary prevention of sudden death  who presents today to establish care in the Electrophysiology device clinic.   The patient reports doing very well since having his ICD implanted and remains very active despite his age.  He denies every receiving ICD shock therapy.  Today, he  denies symptoms of palpitations, chest pain, shortness of breath, orthopnea, PND, lower extremity edema, dizziness, presyncope, syncope, or neurologic sequela.  The patientis tolerating medications without difficulties and is otherwise without complaint today.   Past Medical History  Diagnosis Date  . Myocardial infarction, old     x2 in [place]. S/P CABG approx 9 years ago per Dr. Ricard Dillon  . Diabetes mellitus     Type 2  . Hypertension   . CAD (coronary artery disease)   . Erectile dysfunction   . Gout   . Systolic CHF, acute on chronic     EF is 27% per myoview and echo October 2012  . ICD (implantable cardiac defibrillator) in place   . Hyperlipidemia   . Obstructive lung disease     Moderate per prior PFT's   Past Surgical History  Procedure Date  . Bypass graft     x5  . Cardiac defibrillator placement 2008    by Dr Elonda Husky at [facility]  . Cholecystectomy   . Knee arthroscopy     right  . Umbilical hernia repair   . Cataract surgery     History   Social History  . Marital Status: Married    Spouse Name: N/A    Number of Children: 4  . Years of Education: N/A   Occupational History  . heavy Radio producer    Social History Main Topics  . Smoking status: Former Research scientist (life sciences)  . Smokeless tobacco: Not on file  . Alcohol Use: No  . Drug Use: No  . Sexually Active:    Other Topics Concern  . Not on file   Social History Narrative  . No narrative on file    Family History  Problem Relation  Age of Onset  . Heart failure Mother   . Heart attack Father   . Heart disease Sister     valve replaced  . Heart attack Brother     CABG, aortic grafting    Allergies  Allergen Reactions  . Adhesive (Tape)     Testosterone patch adhesive    Current Outpatient Prescriptions  Medication Sig Dispense Refill  . Ascorbic Acid (VITAMIN C) 1000 MG tablet Take 1,000 mg by mouth daily.      Marland Kitchen aspirin 81 MG tablet Take 81 mg by mouth daily.        . carvedilol (COREG) 12.5 MG tablet Take 1 tablet (12.5 mg total) by mouth 2 (two) times daily.  30 tablet  11  . furosemide (LASIX) 40 MG tablet Take 1 tablet (40 mg total) by mouth 2 (two) times daily.  60 tablet  11  . glipiZIDE (GLUCOTROL) 10 MG tablet Take 10 mg by mouth 2 (two) times daily before a meal.        . insulin aspart protamine-insulin aspart (NOVOLOG 70/30) (70-30) 100 UNIT/ML injection Inject into the skin.        Marland Kitchen isosorbide mononitrate (IMDUR) 30 MG 24 hr tablet Take 1 tablet by  mouth Daily.      Marland Kitchen lisinopril (PRINIVIL,ZESTRIL) 10 MG tablet Take 1 tablet (10 mg total) by mouth 2 (two) times daily.  60 tablet  11  . Multiple Vitamin (MULTIVITAMIN) tablet Take 1 tablet by mouth daily.        . Omega-3 Fatty Acids (FISH OIL PO) Take 1 tablet by mouth daily.       . Potassium 99 MG TABS Take 1 tablet by mouth daily as needed.        . Saw Palmetto, Serenoa repens, (SAW PALMETTO PO) Take 1 tablet by mouth daily.       . simvastatin (ZOCOR) 80 MG tablet Take 80 mg by mouth at bedtime.        . Vitamin D, Ergocalciferol, (DRISDOL) 50000 UNITS CAPS Take 1 tablet by mouth every 7 (seven) days.       Marland Kitchen VITAMIN E PO Take 1 tablet by mouth daily.         ROS- all systems are reviewed and negative except as per HPI  Physical Exam: Filed Vitals:   02/08/11 0825  BP: 124/65  Pulse: 61  Height: 5\' 10"  (1.778 m)  Weight: 213 lb (96.616 kg)    GEN- The patient is well appearing, alert and oriented x 3 today.   Head- normocephalic,  atraumatic Eyes-  Sclera clear, conjunctiva pink Ears- hearing intact Oropharynx- clear Neck- supple, no JVP Lymph- no cervical lymphadenopathy Lungs- Clear to ausculation bilaterally, normal work of breathing Chest- ICD pocket is well healed Heart- Regular rate and rhythm, no murmurs, rubs or gallops, PMI not laterally displaced GI- soft, NT, ND, + BS Extremities- no clubbing, cyanosis, or edema MS- no significant deformity or atrophy Skin- no rash or lesion Psych- euthymic mood, full affect Neuro- strength and sensation are intact  ICD interrogation- reviewed in detail today,  See PACEART report  Assessment and Plan:

## 2011-03-06 ENCOUNTER — Ambulatory Visit: Payer: Medicare HMO | Admitting: Cardiology

## 2011-03-12 ENCOUNTER — Other Ambulatory Visit (HOSPITAL_COMMUNITY): Payer: Medicare HMO

## 2011-03-21 ENCOUNTER — Encounter: Payer: Self-pay | Admitting: Cardiology

## 2011-03-29 ENCOUNTER — Ambulatory Visit (INDEPENDENT_AMBULATORY_CARE_PROVIDER_SITE_OTHER): Payer: Medicare HMO | Admitting: Cardiology

## 2011-03-29 ENCOUNTER — Encounter: Payer: Self-pay | Admitting: Cardiology

## 2011-03-29 VITALS — BP 120/60 | HR 70 | Ht 71.0 in | Wt 210.0 lb

## 2011-03-29 DIAGNOSIS — I2510 Atherosclerotic heart disease of native coronary artery without angina pectoris: Secondary | ICD-10-CM

## 2011-03-29 DIAGNOSIS — I639 Cerebral infarction, unspecified: Secondary | ICD-10-CM | POA: Insufficient documentation

## 2011-03-29 DIAGNOSIS — I2589 Other forms of chronic ischemic heart disease: Secondary | ICD-10-CM

## 2011-03-29 DIAGNOSIS — E119 Type 2 diabetes mellitus without complications: Secondary | ICD-10-CM

## 2011-03-29 DIAGNOSIS — I509 Heart failure, unspecified: Secondary | ICD-10-CM

## 2011-03-29 DIAGNOSIS — I255 Ischemic cardiomyopathy: Secondary | ICD-10-CM

## 2011-03-29 DIAGNOSIS — I6350 Cerebral infarction due to unspecified occlusion or stenosis of unspecified cerebral artery: Secondary | ICD-10-CM

## 2011-03-29 DIAGNOSIS — I5022 Chronic systolic (congestive) heart failure: Secondary | ICD-10-CM

## 2011-03-29 NOTE — Progress Notes (Signed)
Nathaniel Bolton Date of Birth: 25-Feb-1940 Medical Record [id number]  History of Present Illness: Nathaniel Bolton is seen back today for a followup visit.  He is here with his wife. 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 He was evaluated in [place] and carotid Dopplers apparently demonstrated 50% bilateral disease in the carotids. Plavix was added to his medical regimen. He does feel that since we increased his Lasix dose his breathing has been doing much better. His lower extremity edema has resolved. He denies any chest pain. He reports that his recent blood work showed a low HDL cholesterol he was started on niacin. He did have followup ICD evaluation with Dr. Rayann Heman in January.  Current Outpatient Prescriptions on File Prior to Visit  Medication Sig Dispense Refill  . Ascorbic Acid (VITAMIN C) 1000 MG tablet Take 1,000 mg by mouth daily.      Marland Kitchen aspirin 81 MG tablet Take 81 mg by mouth daily.        . carvedilol (COREG) 12.5 MG tablet Take 1 tablet (12.5 mg total) by mouth 2 (two) times daily.  30 tablet  11  . furosemide (LASIX) 40 MG tablet Take 1 tablet (40 mg total) by mouth 2 (two) times daily.  60 tablet  11  . glipiZIDE (GLUCOTROL) 10 MG tablet Take 10 mg by mouth 2 (two) times daily before a meal.        . insulin aspart protamine-insulin aspart (NOVOLOG 70/30) (70-30) 100 UNIT/ML injection Inject into the skin.        Marland Kitchen isosorbide mononitrate (IMDUR) 30 MG 24 hr tablet Take 1 tablet by mouth Daily.      Marland Kitchen lisinopril (PRINIVIL,ZESTRIL) 10 MG tablet Take 1 tablet (10 mg total) by mouth 2 (two) times daily.  60 tablet  11  . Multiple Vitamin (MULTIVITAMIN) tablet Take 1 tablet by mouth daily.        . Omega-3 Fatty Acids (FISH OIL PO) Take 1 tablet by mouth daily.       . Potassium 99 MG TABS Take 1 tablet by mouth daily as needed.        . Saw Palmetto, Serenoa repens, (SAW PALMETTO PO) Take 1 tablet by mouth daily.       .  simvastatin (ZOCOR) 80 MG tablet Take 80 mg by mouth at bedtime.        . Vitamin D, Ergocalciferol, (DRISDOL) 50000 UNITS CAPS Take 1 tablet by mouth every 7 (seven) days.       Marland Kitchen VITAMIN E PO Take 1 tablet by mouth daily.         Allergies  Allergen Reactions  . Adhesive (Tape)     Testosterone patch adhesive    Past Medical History  Diagnosis Date  . Myocardial infarction, old     x2 in [place]. S/P CABG approx 9 years ago per Dr. Ricard Dillon  . Diabetes mellitus     Type 2  . Hypertension   . CAD (coronary artery disease)   . Erectile dysfunction   . Gout   . Systolic CHF, acute on chronic     EF is 27% per myoview and echo October 2012  . ICD (implantable cardiac defibrillator) in place   . Hyperlipidemia   . Obstructive lung disease     Moderate per prior PFT's    Past Surgical History  Procedure Date  . Bypass graft  x5  . Cardiac defibrillator placement 2008    by Dr Elonda Husky at [facility]  . Cholecystectomy   . Knee arthroscopy     right  . Umbilical hernia repair   . Cataract surgery     History  Smoking status  . Former Smoker  Smokeless tobacco  . Not on file    History  Alcohol Use No    Family History  Problem Relation Age of Onset  . Heart failure Mother   . Heart attack Father   . Heart disease Sister     valve replaced  . Heart attack Brother     CABG, aortic grafting    Review of Systems: The review of systems is positive CVA 4 weeks ago.  All other systems were reviewed and are negative.  Physical Exam: BP 120/60  Pulse 70  Ht 5\' 11"  (1.803 m)  Wt 95.255 kg (210 lb)  BMI 29.29 kg/m2 Patient is very pleasant and in no acute distress. Skin is warm and dry. Color is normal.  HEENT is unremarkable. Normocephalic/atraumatic. PERRL. Sclera are nonicteric. Neck is supple. No masses. No JVD. Lungs are clear. Cardiac exam shows a regular rate and rhythm. There is no gallop or murmur. Abdomen is soft. Extremities are without  edema. Gait and ROM are intact. No gross neurologic deficits noted.   LABORATORY DATA:    Assessment / Plan:

## 2011-03-29 NOTE — Patient Instructions (Signed)
Continue your current medications.  Avoid salt.  I will see you again in 6 months.  We will get a copy of your lab work from Dr. Tobie Poet.

## 2011-03-29 NOTE — Assessment & Plan Note (Signed)
He has no clinical symptoms of angina. His nuclear stress test in October showed no evidence of ischemia with an old infarct. We'll continue with his risk factor modification.

## 2011-03-29 NOTE — Assessment & Plan Note (Signed)
Both nuclear stress test and echocardiogram confirmed an ejection fraction of 25-30%. He has evidence of an old inferior and lateral infarction.

## 2011-03-29 NOTE — Assessment & Plan Note (Signed)
He appears to be well compensated on his current Lasix dose. I reinforced the need for sodium restriction. He will remain on carvedilol and lisinopril.

## 2011-05-10 ENCOUNTER — Encounter: Payer: Medicare HMO | Admitting: *Deleted

## 2011-05-15 ENCOUNTER — Encounter: Payer: Self-pay | Admitting: *Deleted

## 2011-06-07 ENCOUNTER — Ambulatory Visit (INDEPENDENT_AMBULATORY_CARE_PROVIDER_SITE_OTHER): Payer: Medicare HMO | Admitting: *Deleted

## 2011-06-07 ENCOUNTER — Encounter: Payer: Self-pay | Admitting: Internal Medicine

## 2011-06-07 DIAGNOSIS — I5022 Chronic systolic (congestive) heart failure: Secondary | ICD-10-CM

## 2011-06-07 DIAGNOSIS — I2589 Other forms of chronic ischemic heart disease: Secondary | ICD-10-CM

## 2011-06-07 DIAGNOSIS — I255 Ischemic cardiomyopathy: Secondary | ICD-10-CM

## 2011-06-07 LAB — ICD DEVICE OBSERVATION
BRDY-0002RV: 40 {beats}/min
DEV-0020ICD: NEGATIVE
FVT: 0
PACEART VT: 0
TOT-0002: 0
TOT-0006: 20091217000000
TZAT-0001SLOWVT: 1
TZAT-0002FASTVT: NEGATIVE
TZAT-0002SLOWVT: NEGATIVE
TZAT-0012SLOWVT: 200 ms
TZAT-0018FASTVT: NEGATIVE
TZAT-0018SLOWVT: NEGATIVE
TZAT-0019FASTVT: 8 V
TZAT-0019SLOWVT: 8 V
TZAT-0020FASTVT: 1.5 ms
TZON-0004VSLOWVT: 20
TZST-0001FASTVT: 5
TZST-0001FASTVT: 6
TZST-0001SLOWVT: 4
TZST-0001SLOWVT: 5
TZST-0001SLOWVT: 6
TZST-0002FASTVT: NEGATIVE
TZST-0002FASTVT: NEGATIVE
TZST-0002SLOWVT: NEGATIVE
TZST-0002SLOWVT: NEGATIVE
VENTRICULAR PACING ICD: 0.03 pct

## 2011-06-07 NOTE — Progress Notes (Signed)
ICD check with ICM 

## 2011-09-05 ENCOUNTER — Encounter: Payer: Self-pay | Admitting: Internal Medicine

## 2011-09-05 ENCOUNTER — Ambulatory Visit (INDEPENDENT_AMBULATORY_CARE_PROVIDER_SITE_OTHER): Payer: Medicare HMO | Admitting: *Deleted

## 2011-09-05 DIAGNOSIS — I255 Ischemic cardiomyopathy: Secondary | ICD-10-CM

## 2011-09-05 DIAGNOSIS — I5022 Chronic systolic (congestive) heart failure: Secondary | ICD-10-CM

## 2011-09-05 DIAGNOSIS — I2589 Other forms of chronic ischemic heart disease: Secondary | ICD-10-CM

## 2011-09-05 LAB — ICD DEVICE OBSERVATION
BATTERY VOLTAGE: 3.1142 V
BRDY-0002RV: 40 {beats}/min
CHARGE TIME: 10.38 s
FVT: 0
RV LEAD AMPLITUDE: 6.25 mv
RV LEAD IMPEDENCE ICD: 342 Ohm
RV LEAD THRESHOLD: 0.875 V
TZAT-0001FASTVT: 1
TZAT-0001SLOWVT: 1
TZAT-0002FASTVT: NEGATIVE
TZAT-0002SLOWVT: NEGATIVE
TZAT-0012FASTVT: 200 ms
TZAT-0020SLOWVT: 1.5 ms
TZON-0003SLOWVT: 360 ms
TZST-0001FASTVT: 4
TZST-0001FASTVT: 5
TZST-0001SLOWVT: 3
TZST-0001SLOWVT: 5
TZST-0002FASTVT: NEGATIVE
TZST-0002FASTVT: NEGATIVE
TZST-0002FASTVT: NEGATIVE
TZST-0002SLOWVT: NEGATIVE
VF: 0

## 2011-09-05 NOTE — Progress Notes (Signed)
ICD check with ICM 

## 2011-09-27 ENCOUNTER — Encounter: Payer: Self-pay | Admitting: Cardiology

## 2011-10-09 ENCOUNTER — Ambulatory Visit (INDEPENDENT_AMBULATORY_CARE_PROVIDER_SITE_OTHER): Payer: Medicare HMO | Admitting: Cardiology

## 2011-10-09 ENCOUNTER — Encounter: Payer: Self-pay | Admitting: Cardiology

## 2011-10-09 VITALS — BP 132/64 | HR 78 | Ht 69.0 in | Wt 217.8 lb

## 2011-10-09 DIAGNOSIS — I5022 Chronic systolic (congestive) heart failure: Secondary | ICD-10-CM

## 2011-10-09 DIAGNOSIS — I2589 Other forms of chronic ischemic heart disease: Secondary | ICD-10-CM

## 2011-10-09 DIAGNOSIS — I252 Old myocardial infarction: Secondary | ICD-10-CM

## 2011-10-09 DIAGNOSIS — I2510 Atherosclerotic heart disease of native coronary artery without angina pectoris: Secondary | ICD-10-CM

## 2011-10-09 DIAGNOSIS — I255 Ischemic cardiomyopathy: Secondary | ICD-10-CM

## 2011-10-09 DIAGNOSIS — Z95810 Presence of automatic (implantable) cardiac defibrillator: Secondary | ICD-10-CM

## 2011-10-09 MED ORDER — SPIRONOLACTONE 25 MG PO TABS: 25.0000 mg | ORAL_TABLET | Freq: Every day | ORAL | Status: DC

## 2011-10-09 NOTE — Patient Instructions (Signed)
Stop taking isosorbide and potassium.  Continue the reduced dose of lisinopril.  We will start aldactone 25 mg daily.  Dr. Tobie Poet needs to check your renal function and potassium in 2-4 weeks.  I will see you again in 3 months.

## 2011-10-09 NOTE — Progress Notes (Signed)
Nathaniel Bolton Date of Birth: November 11, 1940 Medical Record [id number]  History of Present Illness: Mr. Grady is seen back today for a followup visit.  He has a history of congestive heart failure with ejection fraction of 27%. He also has a history of coronary disease with remote coronary bypass surgery. Recently he complains that his blood pressure has been dropping particularly when he bends over. He has occasional dizziness. This past week he has  significant increase in lower extremity swelling and abdominal girth. Yesterday his lisinopril was reduced. He was given an extra diuretic and has had a fairly good diuresis. He denies any palpitations. He's had no orthopnea or PND.  Current Outpatient Prescriptions on File Prior to Visit  Medication Sig Dispense Refill  . Ascorbic Acid (VITAMIN C) 1000 MG tablet Take 1,000 mg by mouth daily.      Marland Kitchen aspirin 325 MG tablet Take 325 mg by mouth daily.      . carvedilol (COREG) 12.5 MG tablet Take 6.25 mg by mouth 2 (two) times daily.      . furosemide (LASIX) 40 MG tablet Take 40 mg by mouth daily.      Marland Kitchen glipiZIDE (GLUCOTROL) 10 MG tablet Take 10 mg by mouth 2 (two) times daily before a meal.        . insulin aspart protamine-insulin aspart (NOVOLOG 70/30) (70-30) 100 UNIT/ML injection Inject 40 Units into the skin 2 (two) times daily with a meal. 40units in the am 38 in the pm      . isosorbide mononitrate (IMDUR) 30 MG 24 hr tablet Take 1 tablet by mouth Daily.      Marland Kitchen lisinopril (PRINIVIL,ZESTRIL) 10 MG tablet Take 1 tablet (10 mg total) by mouth 2 (two) times daily.  60 tablet  11  . niacin (NIASPAN) 500 MG CR tablet Take 500 mg by mouth at bedtime.      . Omega-3 Fatty Acids (FISH OIL PO) Take 1 tablet by mouth 2 (two) times daily.       . Potassium 99 MG TABS Take 1 tablet by mouth daily as needed.        . Saw Palmetto, Serenoa repens, (SAW PALMETTO PO) Take 1 tablet by mouth daily.       . sertraline (ZOLOFT) 50 MG tablet Take 50 mg by  mouth daily.      . simvastatin (ZOCOR) 80 MG tablet Take 80 mg by mouth at bedtime.        . Vitamin D, Ergocalciferol, (DRISDOL) 50000 UNITS CAPS Take 1 tablet by mouth every 7 (seven) days.       Marland Kitchen VITAMIN E PO Take 1 tablet by mouth daily.       Marland Kitchen DISCONTD: aspirin 81 MG tablet Take 81 mg by mouth daily.       Marland Kitchen spironolactone (ALDACTONE) 25 MG tablet Take 1 tablet (25 mg total) by mouth daily.  90 tablet  3    Allergies  Allergen Reactions  . Adhesive (Tape)     Testosterone patch adhesive    Past Medical History  Diagnosis Date  . Myocardial infarction, old     x2 in [place]. S/P CABG approx 9 years ago per Dr. Ricard Dillon  . Diabetes mellitus     Type 2  . Hypertension   . CAD (coronary artery disease)   . Erectile dysfunction   . Gout   . Systolic CHF, acute on chronic     EF is 27% per  myoview and echo October 2012  . ICD (implantable cardiac defibrillator) in place   . Hyperlipidemia   . Obstructive lung disease     Moderate per prior PFT's    Past Surgical History  Procedure Date  . Bypass graft     x5  . Cardiac defibrillator placement 2008    by Dr Elonda Husky at [facility]  . Cholecystectomy   . Knee arthroscopy     right  . Umbilical hernia repair   . Cataract surgery     History  Smoking status  . Former Smoker  Smokeless tobacco  . Not on file    History  Alcohol Use No    Family History  Problem Relation Age of Onset  . Heart failure Mother   . Heart attack Father   . Heart disease Sister     valve replaced  . Heart attack Brother     CABG, aortic grafting    Review of Systems: The review of systems is positive for increased weight and edema. Some low blood pressure readings.  All other systems were reviewed and are negative.  Physical Exam: BP 132/64  Pulse 78  Ht 5\' 9"  (1.753 m)  Wt 217 lb 12.8 oz (98.793 kg)  BMI 32.16 kg/m2  SpO2 97% Patient is very pleasant and in no acute distress. Skin is warm and dry. Color is  normal.   Normocephalic/atraumatic. PERRL. Sclera are nonicteric. Neck is supple. No masses. Mild JVD. Lungs are clear. Cardiac exam shows a regular rate and rhythm. There is no gallop or murmur. Abdomen is soft, obese, and nontender. Extremities reveal 2+ pretibial edema. Gait and ROM are intact. No gross neurologic deficits noted.   LABORATORY DATA:  ECG today demonstrates normal sinus rhythm with an old inferior infarction. There ST-T wave changes consistent with lateral ischemia. This is unchanged from one year prior.  Assessment / Plan: 1. Acute on chronic systolic CHF. Patient has significant weight gain and increase in edema. I have recommended continuing with his Lasix. We will add Aldactone 25 mg per day. Plan on repeating a basic metabolic panel in 2-4 weeks with his primary care. We will stop his potassium supplement now. He may take an extra Lasix as needed for increased swelling. He is on a reduced dose of lisinopril. We will continue with his carvedilol dose. Continue sodium restriction.  2. Hypotension. This has resolved with reduction in his lisinopril dose. I recommended stopping his isosorbide since I do not feel this is as important in treating his congestive heart failure.  3. Status post CVA.  4. Coronary disease with remote myocardial infarctions in 1980 and 1990. Status post CABG. Patient is without anginal symptoms. Myoview study in October of 2012 showed evidence of an old infarct without ischemia.  5. Status post ICD implant.

## 2011-10-27 ENCOUNTER — Other Ambulatory Visit: Payer: Self-pay | Admitting: Cardiology

## 2011-10-31 ENCOUNTER — Ambulatory Visit: Payer: Medicare HMO | Admitting: Cardiology

## 2011-11-20 ENCOUNTER — Encounter: Payer: Self-pay | Admitting: *Deleted

## 2011-11-27 ENCOUNTER — Telehealth: Payer: Self-pay | Admitting: Cardiology

## 2011-11-27 NOTE — Telephone Encounter (Signed)
Patient called spoke to wife.She stated patient has had increase swelling in feet since last Wednesday 11/21/11.States she called PCP and lasix was increased to 40 mg twice a day for 5 days.States has not seen much improvement.No sob. States patient has appointment tomorrow 11/28/11 to get ICD checked wants to see Dr.Jordan too.Will check with Dr.Jordan and call her back.

## 2011-11-27 NOTE — Telephone Encounter (Signed)
Patient's wife called was told spoke with Dr.Jordan he advised schedule appointment tomorrow 11/28/11 with him.Appointment scheduled with Dr.Jordan 11/28/11 at 4:15 pm.

## 2011-11-27 NOTE — Telephone Encounter (Signed)
Pt having leg swelling, has defib ck tomorrow, wants to see [place] while here, pls call

## 2011-11-28 ENCOUNTER — Ambulatory Visit (INDEPENDENT_AMBULATORY_CARE_PROVIDER_SITE_OTHER): Payer: Medicare HMO | Admitting: *Deleted

## 2011-11-28 ENCOUNTER — Ambulatory Visit (INDEPENDENT_AMBULATORY_CARE_PROVIDER_SITE_OTHER): Payer: Medicare HMO | Admitting: Cardiology

## 2011-11-28 ENCOUNTER — Encounter: Payer: Self-pay | Admitting: Cardiology

## 2011-11-28 ENCOUNTER — Ambulatory Visit: Payer: Medicare HMO | Admitting: Cardiology

## 2011-11-28 VITALS — BP 126/62 | HR 58 | Ht 70.0 in | Wt 221.0 lb

## 2011-11-28 DIAGNOSIS — I2589 Other forms of chronic ischemic heart disease: Secondary | ICD-10-CM

## 2011-11-28 DIAGNOSIS — I255 Ischemic cardiomyopathy: Secondary | ICD-10-CM

## 2011-11-28 DIAGNOSIS — I5022 Chronic systolic (congestive) heart failure: Secondary | ICD-10-CM

## 2011-11-28 DIAGNOSIS — I2510 Atherosclerotic heart disease of native coronary artery without angina pectoris: Secondary | ICD-10-CM

## 2011-11-28 DIAGNOSIS — I5023 Acute on chronic systolic (congestive) heart failure: Secondary | ICD-10-CM

## 2011-11-28 LAB — ICD DEVICE OBSERVATION
CHARGE TIME: 10.32 s
DEV-0020ICD: NEGATIVE
RV LEAD AMPLITUDE: 8 mv
RV LEAD IMPEDENCE ICD: 342 Ohm
RV LEAD THRESHOLD: 1.25 V
TOT-0001: 1
TOT-0002: 0
TOT-0006: 20091217000000
TZAT-0001SLOWVT: 1
TZAT-0002FASTVT: NEGATIVE
TZAT-0002SLOWVT: NEGATIVE
TZAT-0012FASTVT: 200 ms
TZAT-0019FASTVT: 8 V
TZAT-0019SLOWVT: 8 V
TZAT-0020FASTVT: 1.5 ms
TZON-0004VSLOWVT: 32
TZON-0005SLOWVT: 12
TZST-0001FASTVT: 3
TZST-0001FASTVT: 5
TZST-0001SLOWVT: 3
TZST-0001SLOWVT: 4
TZST-0001SLOWVT: 5
TZST-0002FASTVT: NEGATIVE
TZST-0002FASTVT: NEGATIVE
TZST-0002FASTVT: NEGATIVE
TZST-0002FASTVT: NEGATIVE
TZST-0002SLOWVT: NEGATIVE
TZST-0002SLOWVT: NEGATIVE

## 2011-11-28 MED ORDER — FUROSEMIDE 40 MG PO TABS: 40.0000 mg | ORAL_TABLET | Freq: Two times a day (BID) | ORAL | Status: DC

## 2011-11-28 NOTE — Patient Instructions (Signed)
Increase lasix to 40 mg twice a day.  Continue your other medication  Restrict your salt intake.  I will see you back in 3 weeks with lab work

## 2011-11-28 NOTE — Progress Notes (Signed)
ICD check with ICM 

## 2011-11-28 NOTE — Progress Notes (Signed)
Nathaniel Bolton Date of Birth: [birth year] Medical Record [id number]  History of Present Illness: Nathaniel Bolton is seen back today for followup of congestive heart failure.  He has a history of congestive heart failure with ejection fraction of 27%. He also has a history of coronary disease with remote coronary bypass surgery. On his last visit we added Aldactone to his medical regimen. He has continued to have lower extremity swelling and abdominal swelling. His weight at home has fluctuated between 214 and 216 pounds. He took extra Lasix at 80 mg daily for 5 days. He notes a good diuresis after taking his medication but then really doesn't diurese much the rest of the day. He has no increase shortness or breath or chest pain. His wife states he is following a low-sodium diet. He also report recent lab work with his primary care showed normal potassium level and renal function.  Current Outpatient Prescriptions on File Prior to Visit  Medication Sig Dispense Refill  . Ascorbic Acid (VITAMIN C) 1000 MG tablet Take 1,000 mg by mouth daily.      Marland Kitchen aspirin 325 MG tablet Take 325 mg by mouth daily.      . carvedilol (COREG) 12.5 MG tablet Take 6.25 mg by mouth 2 (two) times daily.      Marland Kitchen glipiZIDE (GLUCOTROL) 10 MG tablet Take 10 mg by mouth 2 (two) times daily before a meal.        . insulin aspart protamine-insulin aspart (NOVOLOG 70/30) (70-30) 100 UNIT/ML injection Inject 40 Units into the skin 2 (two) times daily with a meal. 40units in the am 38 in the pm      . isosorbide mononitrate (IMDUR) 30 MG 24 hr tablet Take 1 tablet by mouth Daily.      . niacin (NIASPAN) 500 MG CR tablet Take 500 mg by mouth at bedtime.      . NON FORMULARY Needles, syringes with needles, glucose reagent test strips      . Omega-3 Fatty Acids (FISH OIL PO) Take 1 tablet by mouth 2 (two) times daily.       Marland Kitchen omeprazole (PRILOSEC) 20 MG capsule Take 20 mg by mouth daily.      . Potassium 99 MG TABS Take 1 tablet by  mouth daily as needed. ( ON HOLD )      . Saw Palmetto, Serenoa repens, (SAW PALMETTO PO) Take 1 tablet by mouth daily.       . sertraline (ZOLOFT) 50 MG tablet Take 25 mg by mouth daily.       . simvastatin (ZOCOR) 80 MG tablet Take 80 mg by mouth at bedtime.        Marland Kitchen spironolactone (ALDACTONE) 25 MG tablet Take 1 tablet (25 mg total) by mouth daily.  90 tablet  3  . Vitamin D, Ergocalciferol, (DRISDOL) 50000 UNITS CAPS Take 1 tablet by mouth every 7 (seven) days.       Marland Kitchen VITAMIN E PO Take 1 tablet by mouth daily.       . [DISCONTINUED] furosemide (LASIX) 40 MG tablet Take 40 mg by mouth daily.      . [DISCONTINUED] furosemide (LASIX) 40 MG tablet Take 1 tablet (40 mg total) by mouth as directed.  60 tablet  3  . [DISCONTINUED] lisinopril (PRINIVIL,ZESTRIL) 10 MG tablet Take 1 tablet (10 mg total) by mouth 2 (two) times daily.  60 tablet  11    Allergies  Allergen Reactions  . Adhesive (Tape)  Testosterone patch adhesive    Past Medical History  Diagnosis Date  . Myocardial infarction, old     x2 in [place]. S/P CABG approx 9 years ago per Dr. Ricard Dillon  . Diabetes mellitus     Type 2  . Hypertension   . CAD (coronary artery disease)   . Erectile dysfunction   . Gout   . Systolic CHF, acute on chronic     EF is 27% per myoview and echo October 2012  . ICD (implantable cardiac defibrillator) in place   . Hyperlipidemia   . Obstructive lung disease     Moderate per prior PFT's    Past Surgical History  Procedure Date  . Bypass graft     x5  . Cardiac defibrillator placement 2008    by Dr Elonda Husky at [facility]  . Cholecystectomy   . Knee arthroscopy     right  . Umbilical hernia repair   . Cataract surgery     History  Smoking status  . Former Smoker  Smokeless tobacco  . Not on file    History  Alcohol Use No    Family History  Problem Relation Age of Onset  . Heart failure Mother   . Heart attack Father   . Heart disease Sister     valve  replaced  . Heart attack Brother     CABG, aortic grafting    Review of Systems: The review of systems is positive for increased weight and edema. Since stopping his isosorbide and reducing his lisinopril dose his episodes of hypotension have been less. All other systems were reviewed and are negative.  Physical Exam: BP 126/62  Pulse 58  Ht 5\' 10"  (1.778 m)  Wt 221 lb (100.245 kg)  BMI 31.71 kg/m2 Patient is very pleasant and in no acute distress. Skin is warm and dry. Color is normal.   Normocephalic/atraumatic. PERRL. Sclera are nonicteric. Neck is supple. No masses. Mild JVD. Lungs are clear. Cardiac exam shows a regular rate and rhythm. There is no gallop or murmur. Abdomen is soft, obese, and nontender. Extremities reveal 2+ pretibial edema. Gait and ROM are intact. No gross neurologic deficits noted.   LABORATORY DATA:    Assessment / Plan: 1. Acute on chronic systolic CHF.  He really has not responded much to the addition of Aldactone. I recommended increasing his Lasix to 40 mg twice a day. We will have him followup in 3 weeks and check a basic metabolic panel and BNP level at that time. He needs to continue with sodium and fluid restriction.  2. Hypotension. Improved.  3. Status post CVA.  4. Coronary disease with remote myocardial infarctions in 1980 and 1990. Status post CABG. Patient is without anginal symptoms. Myoview study in October of 2012 showed evidence of an old infarct without ischemia.  5. Status post ICD implant.

## 2011-12-04 ENCOUNTER — Encounter: Payer: Self-pay | Admitting: *Deleted

## 2011-12-19 ENCOUNTER — Other Ambulatory Visit: Payer: Self-pay | Admitting: *Deleted

## 2011-12-19 DIAGNOSIS — I509 Heart failure, unspecified: Secondary | ICD-10-CM

## 2011-12-20 ENCOUNTER — Other Ambulatory Visit (INDEPENDENT_AMBULATORY_CARE_PROVIDER_SITE_OTHER): Payer: Medicare HMO

## 2011-12-20 DIAGNOSIS — I509 Heart failure, unspecified: Secondary | ICD-10-CM

## 2011-12-20 LAB — BASIC METABOLIC PANEL
CO2: 27 mEq/L (ref 19–32)
Chloride: 99 mEq/L (ref 96–112)
Potassium: 4.6 mEq/L (ref 3.5–5.1)
Sodium: 134 mEq/L — ABNORMAL LOW (ref 135–145)

## 2011-12-20 LAB — BRAIN NATRIURETIC PEPTIDE: Pro B Natriuretic peptide (BNP): 63 pg/mL (ref 0.0–100.0)

## 2011-12-21 ENCOUNTER — Encounter: Payer: Self-pay | Admitting: Internal Medicine

## 2012-01-15 ENCOUNTER — Encounter: Payer: Self-pay | Admitting: Cardiology

## 2012-01-15 ENCOUNTER — Ambulatory Visit (INDEPENDENT_AMBULATORY_CARE_PROVIDER_SITE_OTHER): Payer: Medicare HMO | Admitting: Cardiology

## 2012-01-15 VITALS — BP 132/70 | HR 69 | Ht 70.0 in | Wt 211.0 lb

## 2012-01-15 DIAGNOSIS — R0602 Shortness of breath: Secondary | ICD-10-CM

## 2012-01-15 DIAGNOSIS — I5023 Acute on chronic systolic (congestive) heart failure: Secondary | ICD-10-CM

## 2012-01-15 DIAGNOSIS — I2589 Other forms of chronic ischemic heart disease: Secondary | ICD-10-CM

## 2012-01-15 DIAGNOSIS — I2510 Atherosclerotic heart disease of native coronary artery without angina pectoris: Secondary | ICD-10-CM

## 2012-01-15 DIAGNOSIS — I255 Ischemic cardiomyopathy: Secondary | ICD-10-CM

## 2012-01-15 DIAGNOSIS — I10 Essential (primary) hypertension: Secondary | ICD-10-CM

## 2012-01-15 LAB — BASIC METABOLIC PANEL
CO2: 28 mEq/L (ref 19–32)
Calcium: 9.2 mg/dL (ref 8.4–10.5)
Chloride: 101 mEq/L (ref 96–112)
Glucose, Bld: 203 mg/dL — ABNORMAL HIGH (ref 70–99)
Potassium: 4.2 mEq/L (ref 3.5–5.1)
Sodium: 138 mEq/L (ref 135–145)

## 2012-01-15 LAB — BRAIN NATRIURETIC PEPTIDE: Pro B Natriuretic peptide (BNP): 73 pg/mL (ref 0.0–100.0)

## 2012-01-15 NOTE — Progress Notes (Signed)
[name] Date of Birth: 08-28-1940 Medical Record [id number]  History of Present Illness: Mr. Gronau is seen back today for followup of congestive heart failure.  He has a history of congestive heart failure with ejection fraction of 27%. He also has a history of coronary disease with remote coronary bypass surgery. His last visit we increased his Lasix to 40 mg twice a day. He did diurese very well with this and his weight today is down 10 pounds. He does report that on his last lab work with Dr. Tobie Poet his renal function was worse and his lisinopril was discontinued. He is back to taking 40 mg of Lasix a day now.  Current Outpatient Prescriptions on File Prior to Visit  Medication Sig Dispense Refill  . Ascorbic Acid (VITAMIN C) 1000 MG tablet Take 1,000 mg by mouth daily.      Marland Kitchen aspirin 325 MG tablet Take 325 mg by mouth daily.      . carvedilol (COREG) 12.5 MG tablet Take 6.25 mg by mouth 2 (two) times daily.      . furosemide (LASIX) 40 MG tablet Take 1 tablet (40 mg total) by mouth 2 (two) times daily.  60 tablet  11  . glipiZIDE (GLUCOTROL) 10 MG tablet Take 10 mg by mouth 2 (two) times daily before a meal.        . insulin aspart protamine-insulin aspart (NOVOLOG 70/30) (70-30) 100 UNIT/ML injection Inject 40 Units into the skin 2 (two) times daily with a meal. 40units in the am 38 in the pm      . isosorbide mononitrate (IMDUR) 30 MG 24 hr tablet Take 1 tablet by mouth Daily.      . niacin (NIASPAN) 500 MG CR tablet Take 500 mg by mouth at bedtime.      . NON FORMULARY Needles, syringes with needles, glucose reagent test strips      . Omega-3 Fatty Acids (FISH OIL PO) Take 1 tablet by mouth 2 (two) times daily.       Marland Kitchen omeprazole (PRILOSEC) 20 MG capsule Take 20 mg by mouth daily.      . Potassium 99 MG TABS Take 1 tablet by mouth daily as needed. ( ON HOLD )      . Saw Palmetto, Serenoa repens, (SAW PALMETTO PO) Take 1 tablet by mouth daily.       . sertraline (ZOLOFT) 50  MG tablet Take 25 mg by mouth daily.       . simvastatin (ZOCOR) 80 MG tablet Take 80 mg by mouth at bedtime.        Marland Kitchen spironolactone (ALDACTONE) 25 MG tablet Take 1 tablet (25 mg total) by mouth daily.  90 tablet  3  . Vitamin D, Ergocalciferol, (DRISDOL) 50000 UNITS CAPS Take 1 tablet by mouth every 7 (seven) days.       Marland Kitchen VITAMIN E PO Take 1 tablet by mouth daily.       Marland Kitchen lisinopril (PRINIVIL,ZESTRIL) 10 MG tablet Take 10 mg by mouth 2 (two) times daily.        Allergies  Allergen Reactions  . Adhesive (Tape)     Testosterone patch adhesive    Past Medical History  Diagnosis Date  . Myocardial infarction, old     x2 in [place]. S/P CABG approx 9 years ago per Dr. Ricard Dillon  . Diabetes mellitus     Type 2  . Hypertension   . CAD (coronary artery disease)   .  Erectile dysfunction   . Gout   . Systolic CHF, acute on chronic     EF is 27% per myoview and echo October 2012  . ICD (implantable cardiac defibrillator) in place   . Hyperlipidemia   . Obstructive lung disease     Moderate per prior PFT's    Past Surgical History  Procedure Date  . Bypass graft     x5  . Cardiac defibrillator placement 2008    by Dr Elonda Husky at [facility]  . Cholecystectomy   . Knee arthroscopy     right  . Umbilical hernia repair   . Cataract surgery     History  Smoking status  . Former Smoker  Smokeless tobacco  . Not on file    History  Alcohol Use No    Family History  Problem Relation Age of Onset  . Heart failure Mother   . Heart attack Father   . Heart disease Sister     valve replaced  . Heart attack Brother     CABG, aortic grafting    Review of Systems: The review of systems is as noted in history of present illness. All other systems were reviewed and are negative.  Physical Exam: BP 132/70  Pulse 69  Ht 5\' 10"  (1.778 m)  Wt 211 lb (95.709 kg)  BMI 30.28 kg/m2  SpO2 91% Patient is very pleasant and in no acute distress. Skin is warm and dry.  Color is normal.   Normocephalic/atraumatic. PERRL. Sclera are nonicteric. Neck is supple. No masses. Mild JVD. Lungs are clear. Cardiac exam shows a regular rate and rhythm. There is no gallop or murmur. Abdomen is soft, obese, and nontender. Extremities reveal trace pretibial edema. Gait and ROM are intact. No gross neurologic deficits noted.   LABORATORY DATA:    Assessment / Plan: 1. Acute on chronic systolic CHF.  He had an excellent response to increased diuretics. His edema has resolved. He is close to being euvolemic today. We discussed taking an extra Lasix if his weight goes over 210 pounds. I will obtain a copy of his most recent lab work from Dr. Tobie Poet. We will reassess his renal function and BNP levels today. If his renal function is an issue I would favor continuing with lisinopril instead of Aldactone but we may need to start back at a lower dose depending on his creatinine.  2. Hypotension. Improved.  3. Status post CVA.  4. Coronary disease with remote myocardial infarctions in 1980 and 1990. Status post CABG. Patient is without anginal symptoms. Myoview study in October of 2012 showed evidence of an old infarct without ischemia.  5. Status post ICD implant.

## 2012-01-15 NOTE — Patient Instructions (Signed)
We will check blood work today and then make recommendations about your lisinopril and aldactone.  Continue sodium restriction.   Continue lasix 40 mg daily with an extra dose as needed for weight greater than 210 lbs.  I will see you in 2 months.

## 2012-01-16 ENCOUNTER — Other Ambulatory Visit: Payer: Self-pay

## 2012-01-16 ENCOUNTER — Telehealth: Payer: Self-pay | Admitting: Cardiology

## 2012-01-16 DIAGNOSIS — I10 Essential (primary) hypertension: Secondary | ICD-10-CM

## 2012-01-16 MED ORDER — LISINOPRIL 10 MG PO TABS: 10.0000 mg | ORAL_TABLET | Freq: Every day | ORAL | Status: DC

## 2012-01-16 NOTE — Telephone Encounter (Signed)
Pt's wife rtn call to cheryl re test results

## 2012-01-16 NOTE — Telephone Encounter (Signed)
Patient called spoke to wife lab results given.

## 2012-01-16 NOTE — Telephone Encounter (Signed)
F/U   Returning call back to nurse.   

## 2012-02-07 ENCOUNTER — Encounter: Payer: Medicare HMO | Admitting: Internal Medicine

## 2012-02-18 ENCOUNTER — Other Ambulatory Visit: Payer: Medicare HMO

## 2012-02-29 ENCOUNTER — Encounter: Payer: Self-pay | Admitting: Internal Medicine

## 2012-02-29 ENCOUNTER — Ambulatory Visit (INDEPENDENT_AMBULATORY_CARE_PROVIDER_SITE_OTHER): Payer: Medicare HMO | Admitting: Internal Medicine

## 2012-02-29 VITALS — BP 120/69 | HR 57 | Ht 70.0 in | Wt 214.8 lb

## 2012-02-29 DIAGNOSIS — I5023 Acute on chronic systolic (congestive) heart failure: Secondary | ICD-10-CM

## 2012-02-29 DIAGNOSIS — I10 Essential (primary) hypertension: Secondary | ICD-10-CM

## 2012-02-29 DIAGNOSIS — I2589 Other forms of chronic ischemic heart disease: Secondary | ICD-10-CM

## 2012-02-29 DIAGNOSIS — I255 Ischemic cardiomyopathy: Secondary | ICD-10-CM

## 2012-02-29 DIAGNOSIS — Z95810 Presence of automatic (implantable) cardiac defibrillator: Secondary | ICD-10-CM

## 2012-02-29 DIAGNOSIS — I2510 Atherosclerotic heart disease of native coronary artery without angina pectoris: Secondary | ICD-10-CM

## 2012-02-29 LAB — ICD DEVICE OBSERVATION
RV LEAD AMPLITUDE: 7.8 mv
RV LEAD THRESHOLD: 1.125 V
TZAT-0001FASTVT: 1
TZAT-0012FASTVT: 200 ms
TZAT-0019SLOWVT: 8 V
TZAT-0020SLOWVT: 1.5 ms
TZON-0003SLOWVT: 360 ms
TZON-0003VSLOWVT: 400 ms
TZON-0004SLOWVT: 32
TZON-0005SLOWVT: 12
TZST-0001FASTVT: 2
TZST-0001FASTVT: 3
TZST-0001FASTVT: 4
TZST-0001SLOWVT: 2
TZST-0001SLOWVT: 3
TZST-0002FASTVT: NEGATIVE
TZST-0002FASTVT: NEGATIVE
TZST-0002FASTVT: NEGATIVE
TZST-0002SLOWVT: NEGATIVE
TZST-0002SLOWVT: NEGATIVE
TZST-0002SLOWVT: NEGATIVE

## 2012-02-29 NOTE — Assessment & Plan Note (Signed)
Doing well without symptoms of ischemia or CHF Normal ICD function See Pace Art report No changes today

## 2012-02-29 NOTE — Patient Instructions (Addendum)
Your physician wants you to follow-up in: 12 months with Dr Vallery Ridge will receive a reminder letter in the mail two months in advance. If you don't receive a letter, please call our office to schedule the follow-up appointment.    Remote monitoring is used to monitor your Pacemaker of ICD from home. This monitoring reduces the number of office visits required to check your device to one time per year. It allows [place] to keep an eye on the functioning of your device to ensure it is working properly. You are scheduled for a device check from home on 05/26/2012. You may send your transmission at any time that day. If you have a wireless device, the transmission will be sent automatically. After your physician reviews your transmission, you will receive a postcard with your next transmission date.

## 2012-02-29 NOTE — Assessment & Plan Note (Signed)
Stable No change required today  

## 2012-02-29 NOTE — Progress Notes (Signed)
Nathaniel Brome, MD:PCP Primary Cardiologist:  Dr [place]  Nathaniel Bolton is a 72 y.o. male with a h/o ischemic CM sp ICD (MDT) by Dr Elonda Husky at [facility] for primary prevention of sudden death  who presents today for follow-up in the Electrophysiology device clinic.   The patient reports doing very well since having his ICD implanted and remains very active despite his age. Today, he  denies symptoms of palpitations, chest pain, shortness of breath, orthopnea, PND, lower extremity edema, dizziness, presyncope, syncope, or neurologic sequela.  The patientis tolerating medications without difficulties and is otherwise without complaint today.   Past Medical History  Diagnosis Date  . Myocardial infarction, old     x2 in [place]. S/P CABG approx 9 years ago per Dr. Ricard Dillon  . Diabetes mellitus     Type 2  . Hypertension   . CAD (coronary artery disease)   . Erectile dysfunction   . Gout   . Systolic CHF, acute on chronic     EF is 27% per myoview and echo October 2012  . ICD (implantable cardiac defibrillator) in place   . Hyperlipidemia   . Obstructive lung disease     Moderate per prior PFT's   Past Surgical History  Procedure Laterality Date  . Bypass graft      x5  . Cardiac defibrillator placement  2008    by Dr Elonda Husky at [facility]  . Cholecystectomy    . Knee arthroscopy      right  . Umbilical hernia repair    . Cataract surgery      History   Social History  . Marital Status: Married    Spouse Name: N/A    Number of Children: 4  . Years of Education: N/A   Occupational History  . heavy Radio producer    Social History Main Topics  . Smoking status: Former Research scientist (life sciences)  . Smokeless tobacco: Not on file  . Alcohol Use: No  . Drug Use: No  . Sexually Active:    Other Topics Concern  . Not on file   Social History Narrative  . No narrative on file    Family History  Problem Relation Age of Onset  . Heart failure Mother    . Heart attack Father   . Heart disease Sister     valve replaced  . Heart attack Brother     CABG, aortic grafting    Allergies  Allergen Reactions  . Adhesive (Tape)     Testosterone patch adhesive    Current Outpatient Prescriptions  Medication Sig Dispense Refill  . Ascorbic Acid (VITAMIN C) 1000 MG tablet Take 1,000 mg by mouth daily.      Marland Kitchen aspirin 325 MG tablet Take 325 mg by mouth daily.      . carvedilol (COREG) 12.5 MG tablet Take 6.25 mg by mouth 2 (two) times daily.      . furosemide (LASIX) 40 MG tablet Take 40 mg by mouth daily as needed.      Marland Kitchen glipiZIDE (GLUCOTROL) 10 MG tablet Take 10 mg by mouth 2 (two) times daily before a meal.        . insulin aspart protamine-insulin aspart (NOVOLOG 70/30) (70-30) 100 UNIT/ML injection Inject into the skin. 25 units only as needed      . lisinopril (PRINIVIL,ZESTRIL) 10 MG tablet Take 1 tablet (10 mg total) by mouth daily.  30 tablet  6  . NON FORMULARY Needles,  syringes with needles, glucose reagent test strips      . Omega-3 Fatty Acids (FISH OIL PO) Take 1 tablet by mouth 2 (two) times daily.       Marland Kitchen omeprazole (PRILOSEC) 20 MG capsule Take 20 mg by mouth daily.      . Saw Palmetto, Serenoa repens, (SAW PALMETTO PO) Take 1 tablet by mouth daily.       . sertraline (ZOLOFT) 50 MG tablet Take 50 mg by mouth daily.       . simvastatin (ZOCOR) 80 MG tablet Take 80 mg by mouth at bedtime.        . Vitamin D, Ergocalciferol, (DRISDOL) 50000 UNITS CAPS Take 1 tablet by mouth every 7 (seven) days.       Marland Kitchen VITAMIN E PO Take 1 tablet by mouth daily.       . Potassium 99 MG TABS Take 1 tablet by mouth daily as needed. ( ON HOLD )       No current facility-administered medications for this visit.    Physical Exam: Filed Vitals:   02/29/12 1025  BP: 120/69  Pulse: 57  Height: 5\' 10"  (1.778 m)  Weight: 214 lb 12.8 oz (97.433 kg)    GEN- The patient is well appearing, alert and oriented x 3 today.   Head- normocephalic,  atraumatic Eyes-  Sclera clear, conjunctiva pink Ears- hearing intact Oropharynx- clear Neck- supple, no JVP Lymph- no cervical lymphadenopathy Lungs- Clear to ausculation bilaterally, normal work of breathing Chest- ICD pocket is well healed Heart- Regular rate and rhythm, no murmurs, rubs or gallops, PMI not laterally displaced GI- soft, NT, ND, + BS Extremities- no clubbing, cyanosis, or edema  ICD interrogation- reviewed in detail today,  See PACEART report  Assessment and Plan:

## 2012-03-24 ENCOUNTER — Ambulatory Visit: Payer: Medicare HMO | Admitting: Cardiology

## 2012-05-26 ENCOUNTER — Encounter: Payer: Medicare HMO | Admitting: *Deleted

## 2012-05-29 ENCOUNTER — Encounter: Payer: Self-pay | Admitting: Cardiology

## 2012-05-29 ENCOUNTER — Ambulatory Visit (INDEPENDENT_AMBULATORY_CARE_PROVIDER_SITE_OTHER): Payer: Medicare HMO | Admitting: Cardiology

## 2012-05-29 VITALS — BP 124/62 | HR 71 | Ht 70.0 in | Wt 213.4 lb

## 2012-05-29 DIAGNOSIS — I255 Ischemic cardiomyopathy: Secondary | ICD-10-CM

## 2012-05-29 DIAGNOSIS — I2589 Other forms of chronic ischemic heart disease: Secondary | ICD-10-CM

## 2012-05-29 DIAGNOSIS — I5022 Chronic systolic (congestive) heart failure: Secondary | ICD-10-CM

## 2012-05-29 DIAGNOSIS — I10 Essential (primary) hypertension: Secondary | ICD-10-CM

## 2012-05-29 DIAGNOSIS — I2510 Atherosclerotic heart disease of native coronary artery without angina pectoris: Secondary | ICD-10-CM

## 2012-05-29 NOTE — Patient Instructions (Addendum)
We will get a copy of your lab work from Dr. Tobie Poet.  Continue your current therapy and keep up with exercise.  I will see you in 4 months.

## 2012-05-29 NOTE — Progress Notes (Signed)
Nathaniel Bolton Date of Birth: 04-26-1940 Medical Record [id number]  History of Present Illness: [name] is seen back today for followup of congestive heart failure.  He has a history of congestive heart failure with ejection fraction of 27%. He also has a history of coronary disease with remote coronary bypass surgery. He reports that he had a severe gouty attack 4 weeks ago. Along with this he had increased fluid retention and his Lasix was increased to 80 mg daily. His swelling has gone down. His weight is actually down a pound compared to January. He denies any current shortness of breath or chest pain. His gouty symptoms have resolved. He reports that he had lab work in March and his renal function was okay.  Current Outpatient Prescriptions on File Prior to Visit  Medication Sig Dispense Refill  . Ascorbic Acid (VITAMIN C) 1000 MG tablet Take 1,000 mg by mouth daily.      Marland Kitchen aspirin 325 MG tablet Take 325 mg by mouth daily.      . carvedilol (COREG) 12.5 MG tablet Take 6.25 mg by mouth 2 (two) times daily.      . furosemide (LASIX) 40 MG tablet Take 80 mg by mouth daily.       Marland Kitchen glipiZIDE (GLUCOTROL) 10 MG tablet Take 10 mg by mouth 2 (two) times daily before a meal.        . insulin aspart protamine-insulin aspart (NOVOLOG 70/30) (70-30) 100 UNIT/ML injection Inject into the skin. 25 units only as needed      . NON FORMULARY Needles, syringes with needles, glucose reagent test strips      . Omega-3 Fatty Acids (FISH OIL PO) Take 1 tablet by mouth 2 (two) times daily.       Marland Kitchen omeprazole (PRILOSEC) 20 MG capsule Take 20 mg by mouth daily.      . Saw Palmetto, Serenoa repens, (SAW PALMETTO PO) Take 1 tablet by mouth daily.       . sertraline (ZOLOFT) 50 MG tablet Take 50 mg by mouth daily.       . simvastatin (ZOCOR) 80 MG tablet Take 80 mg by mouth at bedtime.        Marland Kitchen VITAMIN E PO Take 1 tablet by mouth daily.        No current facility-administered medications on file prior to  visit.    Allergies  Allergen Reactions  . Adhesive (Tape)     Testosterone patch adhesive    Past Medical History  Diagnosis Date  . Myocardial infarction, old     x2 in [place]. S/P CABG approx 9 years ago per Dr. Ricard Dillon  . Diabetes mellitus     Type 2  . Hypertension   . CAD (coronary artery disease)   . Erectile dysfunction   . Gout   . Systolic CHF, acute on chronic     EF is 27% per myoview and echo October 2012  . ICD (implantable cardiac defibrillator) in place   . Hyperlipidemia   . Obstructive lung disease     Moderate per prior PFT's    Past Surgical History  Procedure Laterality Date  . Bypass graft      x5  . Cardiac defibrillator placement  2008    by Dr Elonda Husky at [facility]  . Cholecystectomy    . Knee arthroscopy      right  . Umbilical hernia repair    . Cataract surgery  History  Smoking status  . Former Smoker  Smokeless tobacco  . Not on file    History  Alcohol Use No    Family History  Problem Relation Age of Onset  . Heart failure Mother   . Heart attack Father   . Heart disease Sister     valve replaced  . Heart attack Brother     CABG, aortic grafting    Review of Systems: The review of systems is as noted in history of present illness. All other systems were reviewed and are negative.  Physical Exam: BP 124/62  Pulse 71  Ht 5\' 10"  (1.778 m)  Wt 213 lb 6.4 oz (96.798 kg)  BMI 30.62 kg/m2  SpO2 95% Patient is very pleasant and in no acute distress. Skin is warm and dry. Color is normal.   Normocephalic/atraumatic. PERRL. Sclera are nonicteric. Neck is supple. No masses. Mild JVD. Lungs are clear. Cardiac exam shows a regular rate and rhythm. There is no gallop or murmur. Abdomen is soft, obese, and nontender. Extremities reveal trace pretibial edema. Gait and ROM are intact. No gross neurologic deficits noted.   LABORATORY DATA:    Assessment / Plan: 1. Acute on chronic systolic CHF.  He is doing  well on his current diuretic dose. He is  euvolemic today. . I will obtain a copy of his most recent lab work from Dr. Tobie Poet. Continue carvedilol, lisinopril, and Aldactone.  2. Hypotension. Improved.  3. Status post CVA.  4. Coronary disease with remote myocardial infarctions in 1980 and 1990. Status post CABG. Patient is without anginal symptoms. Myoview study in October of 2012 showed evidence of an old infarct without ischemia.  5. Status post ICD implant. ICD check in February of this year was satisfactory.

## 2012-05-30 ENCOUNTER — Encounter: Payer: Self-pay | Admitting: *Deleted

## 2012-12-12 ENCOUNTER — Other Ambulatory Visit: Payer: Self-pay

## 2012-12-12 MED ORDER — FUROSEMIDE 40 MG PO TABS: 80.0000 mg | ORAL_TABLET | Freq: Every day | ORAL | Status: DC

## 2013-01-27 ENCOUNTER — Encounter: Payer: Self-pay | Admitting: Internal Medicine

## 2013-02-19 ENCOUNTER — Ambulatory Visit (INDEPENDENT_AMBULATORY_CARE_PROVIDER_SITE_OTHER): Payer: Medicare HMO | Admitting: Cardiology

## 2013-02-19 ENCOUNTER — Encounter: Payer: Self-pay | Admitting: Cardiology

## 2013-02-19 VITALS — BP 148/76 | HR 77 | Ht 70.0 in | Wt 197.4 lb

## 2013-02-19 DIAGNOSIS — I5022 Chronic systolic (congestive) heart failure: Secondary | ICD-10-CM

## 2013-02-19 DIAGNOSIS — I255 Ischemic cardiomyopathy: Secondary | ICD-10-CM

## 2013-02-19 DIAGNOSIS — I2589 Other forms of chronic ischemic heart disease: Secondary | ICD-10-CM

## 2013-02-19 DIAGNOSIS — I2510 Atherosclerotic heart disease of native coronary artery without angina pectoris: Secondary | ICD-10-CM

## 2013-02-19 DIAGNOSIS — I10 Essential (primary) hypertension: Secondary | ICD-10-CM

## 2013-02-19 MED ORDER — LISINOPRIL 10 MG PO TABS: 10.0000 mg | ORAL_TABLET | Freq: Every day | ORAL | Status: DC

## 2013-02-19 NOTE — Patient Instructions (Addendum)
We will refill lisinopril  Continue your current therapy  I will see you in 6 months

## 2013-02-19 NOTE — Progress Notes (Signed)
Nathaniel Bolton Date of Birth: 03-10-1940 Medical Record [id number]  History of Present Illness: Nathaniel Bolton is seen back today for followup of congestive heart failure.  He has a history of congestive heart failure with ejection fraction of 27%. He also has a history of coronary disease with remote coronary bypass surgery. Since his last visit he has done well. No chest pain, SOB, or edema. Weight is down. 16 lbs. He states he changed his diet and was started on metformin. Sugars are better. Still having gouty attacks. Apparently intolerant of allopurinol due to rash. Aldactone was discontinued due to CKD. His last ICD check was one year ago.  Current Outpatient Prescriptions on File Prior to Visit  Medication Sig Dispense Refill  . Ascorbic Acid (VITAMIN C) 1000 MG tablet Take 1,000 mg by mouth daily.      Marland Kitchen aspirin 325 MG tablet Take 325 mg by mouth daily.      . carvedilol (COREG) 12.5 MG tablet Take 6.25 mg by mouth 2 (two) times daily.      . furosemide (LASIX) 40 MG tablet Take 2 tablets (80 mg total) by mouth daily.  30 tablet  3  . glipiZIDE (GLUCOTROL) 10 MG tablet Take 10 mg by mouth 2 (two) times daily before a meal.        . insulin aspart protamine-insulin aspart (NOVOLOG 70/30) (70-30) 100 UNIT/ML injection Inject into the skin. 25 units only as needed      . NON FORMULARY Needles, syringes with needles, glucose reagent test strips      . Omega-3 Fatty Acids (FISH OIL PO) Take 1 tablet by mouth 2 (two) times daily.       Marland Kitchen omeprazole (PRILOSEC) 20 MG capsule Take 40 mg by mouth daily.       . Saw Palmetto, Serenoa repens, (SAW PALMETTO PO) Take 1 tablet by mouth daily.       . sertraline (ZOLOFT) 50 MG tablet Take 50 mg by mouth daily.       . simvastatin (ZOCOR) 80 MG tablet Take 80 mg by mouth at bedtime.        Marland Kitchen VITAMIN E PO Take 1 tablet by mouth daily.        No current facility-administered medications on file prior to visit.    Allergies  Allergen Reactions   . Adhesive [Tape]     Testosterone patch adhesive    Past Medical History  Diagnosis Date  . Myocardial infarction, old     x2 in [place]. S/P CABG approx 9 years ago per Dr. Ricard Dillon  . Diabetes mellitus     Type 2  . Hypertension   . CAD (coronary artery disease)   . Erectile dysfunction   . Gout   . Systolic CHF, acute on chronic     EF is 27% per myoview and echo October 2012  . ICD (implantable cardiac defibrillator) in place   . Hyperlipidemia   . Obstructive lung disease     Moderate per prior PFT's    Past Surgical History  Procedure Laterality Date  . Bypass graft      x5  . Cardiac defibrillator placement  2008    by Dr Elonda Husky at [facility]  . Cholecystectomy    . Knee arthroscopy      right  . Umbilical hernia repair    . Cataract surgery      History  Smoking status  . Former Smoker  Smokeless  tobacco  . Not on file    History  Alcohol Use No    Family History  Problem Relation Age of Onset  . Heart failure Mother   . Heart attack Father   . Heart disease Sister     valve replaced  . Heart attack Brother     CABG, aortic grafting    Review of Systems: The review of systems is as noted in history of present illness. All other systems were reviewed and are negative.  Physical Exam: BP 148/76  Pulse 77  Ht 5\' 10"  (1.778 m)  Wt 197 lb 6.4 oz (89.54 kg)  BMI 28.32 kg/m2 Patient is very pleasant and in no acute distress. Skin is warm and dry. Color is normal.   Normocephalic/atraumatic. PERRL. Sclera are nonicteric. Neck is supple. No masses. Mild JVD. Lungs are clear. Cardiac exam shows a regular rate and rhythm. There is no gallop or murmur. Abdomen is soft, obese, and nontender. Extremities reveal trace pretibial edema. Gait and ROM are intact. No gross neurologic deficits noted.   LABORATORY DATA:  Ecg: NSR NSIVCD, old inferior infarct.  Assessment / Plan: 1. Chronic systolic CHF.  He is doing well on his current diuretic  dose. He is  euvolemic today. . I will obtain a copy of his most recent lab work from Dr. Tobie Poet. Continue carvedilol, lisinopril. Aldactone held to to renal issues.  2.  Status post CVA.  3. Coronary disease with remote myocardial infarctions in 1980 and 1990. Status post CABG. Patient is without anginal symptoms. Myoview study in October of 2012 showed evidence of an old infarct without ischemia. He is asymptomatic.  4. Status post ICD implant. ICD check due in February. No remote check since last year. Does not have a land phone line. Follow up will need to be addressed by device clinic.

## 2013-03-02 ENCOUNTER — Encounter: Payer: Commercial Managed Care - HMO | Admitting: Internal Medicine

## 2013-03-04 ENCOUNTER — Ambulatory Visit (INDEPENDENT_AMBULATORY_CARE_PROVIDER_SITE_OTHER): Payer: Commercial Managed Care - HMO | Admitting: Cardiology

## 2013-03-04 ENCOUNTER — Encounter: Payer: Self-pay | Admitting: Internal Medicine

## 2013-03-04 ENCOUNTER — Encounter: Payer: Self-pay | Admitting: Cardiology

## 2013-03-04 VITALS — BP 110/68 | HR 60 | Ht 70.0 in | Wt 199.8 lb

## 2013-03-04 DIAGNOSIS — Z95810 Presence of automatic (implantable) cardiac defibrillator: Secondary | ICD-10-CM

## 2013-03-04 DIAGNOSIS — I255 Ischemic cardiomyopathy: Secondary | ICD-10-CM

## 2013-03-04 DIAGNOSIS — I2589 Other forms of chronic ischemic heart disease: Secondary | ICD-10-CM

## 2013-03-04 DIAGNOSIS — I5022 Chronic systolic (congestive) heart failure: Secondary | ICD-10-CM

## 2013-03-04 DIAGNOSIS — I472 Ventricular tachycardia: Secondary | ICD-10-CM

## 2013-03-04 DIAGNOSIS — I4729 Other ventricular tachycardia: Secondary | ICD-10-CM

## 2013-03-04 LAB — MDC_IDC_ENUM_SESS_TYPE_INCLINIC
Battery Voltage: 3.06 V
Brady Statistic RV Percent Paced: 0.2 %
Date Time Interrogation Session: 20150225140955
HIGH POWER IMPEDANCE MEASURED VALUE: 47 Ohm
HighPow Impedance: 66 Ohm
Lead Channel Sensing Intrinsic Amplitude: 7.4 mV
Lead Channel Setting Pacing Amplitude: 2.5 V
Lead Channel Setting Sensing Sensitivity: 0.3 mV
MDC IDC MSMT LEADCHNL RV IMPEDANCE VALUE: 418 Ohm
MDC IDC MSMT LEADCHNL RV PACING THRESHOLD AMPLITUDE: 1 V
MDC IDC MSMT LEADCHNL RV PACING THRESHOLD PULSEWIDTH: 0.4 ms
MDC IDC SET LEADCHNL RV PACING PULSEWIDTH: 0.4 ms
MDC IDC SET ZONE DETECTION INTERVAL: 300 ms
Zone Setting Detection Interval: 360 ms
Zone Setting Detection Interval: 400 ms

## 2013-03-04 NOTE — Patient Instructions (Signed)
Your physician recommends that you schedule a follow-up appointment in: [place]  Your physician recommends that you continue on your current medications as directed. Please refer to the Current Medication list given to you today.

## 2013-03-04 NOTE — Progress Notes (Signed)
Patient ID: Nathaniel Bolton MRN: [id number], DOB/AGE: Feb 19, 1940   Date of Visit: 03/04/2013  Primary Physician: Nathaniel Brome, MD Primary Cardiologist: [place], MD Reason for Visit: EP/device follow-up  History of Present Illness  Nathaniel Bolton is a 73 y.o. male with an ischemic CM s/p ICD implant for primary prevention of SCD who presents today for routine electrophysiology followup. Since last being seen in our clinic, he reports he is doing well and has no complaints. He is feeling well as he is being more active and losing weight with diet and exercise / walking dogs daily. He denies chest pain or shortness of breath. He denies palpitations, dizziness, near syncope or syncope. He denies LE swelling, orthopnea, PND or recent weight gain. He is compliant and tolerating medications without difficulty.  Past Medical History Past Medical History  Diagnosis Date  . Myocardial infarction, old     x2 in [place]. S/P CABG approx 9 years ago per Nathaniel Bolton  . Diabetes mellitus     Type 2  . Hypertension   . CAD (coronary artery disease)   . Erectile dysfunction   . Gout   . Systolic CHF, acute on chronic     EF is 27% per myoview and echo October 2012  . ICD (implantable cardiac defibrillator) in place   . Hyperlipidemia   . Obstructive lung disease     Moderate per prior PFT's    Past Surgical History Past Surgical History  Procedure Laterality Date  . Bypass graft      x5  . Cardiac defibrillator placement  2008    by Nathaniel Bolton at [facility]  . Cholecystectomy    . Knee arthroscopy      right  . Umbilical hernia repair    . Cataract surgery      Allergies/Intolerances Allergies  Allergen Reactions  . Adhesive [Tape]     Testosterone patch adhesive    Current Home Medications Current Outpatient Prescriptions  Medication Sig Dispense Refill  . Ascorbic Acid (VITAMIN C) 1000 MG tablet Take 1,000 mg by mouth daily.      Marland Kitchen aspirin 325 MG tablet Take 325  mg by mouth daily.      . carvedilol (COREG) 12.5 MG tablet Take 6.25 mg by mouth 2 (two) times daily.      . furosemide (LASIX) 40 MG tablet Take 2 tablets (80 mg total) by mouth daily.  30 tablet  3  . glipiZIDE (GLUCOTROL) 10 MG tablet Take 10 mg by mouth 2 (two) times daily before a meal.        . insulin aspart protamine-insulin aspart (NOVOLOG 70/30) (70-30) 100 UNIT/ML injection Inject into the skin. 25 units only as needed      . lisinopril (PRINIVIL,ZESTRIL) 10 MG tablet Take 1 tablet (10 mg total) by mouth daily.  30 tablet  6  . metFORMIN (GLUCOPHAGE) 500 MG tablet 1,000 mg.       . NON FORMULARY Needles, syringes with needles, glucose reagent test strips      . Omega-3 Fatty Acids (FISH OIL PO) Take 2 tablets by mouth 2 (two) times daily.       Marland Kitchen omeprazole (PRILOSEC) 20 MG capsule Take 40 mg by mouth daily.       . Saw Palmetto, Serenoa repens, (SAW PALMETTO PO) Take 1 tablet by mouth daily.       . sertraline (ZOLOFT) 50 MG tablet Take 50 mg by mouth daily.       Marland Kitchen  simvastatin (ZOCOR) 80 MG tablet Take 80 mg by mouth at bedtime.        Marland Kitchen VITAMIN E PO Take 1 tablet by mouth daily.        No current facility-administered medications for this visit.    Social History History   Social History  . Marital Status: Married    Spouse Name: N/A    Number of Children: 4  . Years of Education: N/A   Occupational History  . heavy Radio producer    Social History Main Topics  . Smoking status: Former Research scientist (life sciences)  . Smokeless tobacco: Not on file  . Alcohol Use: No  . Drug Use: No  . Sexual Activity:    Other Topics Concern  . Not on file   Social History Narrative  . No narrative on file     Review of Systems General: No chills, fever, night sweats or weight changes Cardiovascular: No chest pain, dyspnea on exertion, edema, orthopnea, palpitations, paroxysmal nocturnal dyspnea Dermatological: No rash, lesions or masses Respiratory: No cough, dyspnea Urologic: No  hematuria, dysuria Abdominal: No nausea, vomiting, diarrhea, bright red blood per rectum, melena, or hematemesis Neurologic: No visual changes, weakness, changes in mental status All other systems reviewed and are otherwise negative except as noted above.  Physical Exam Vitals: Blood pressure 110/68, pulse 60, height 5\' 10"  (1.778 m), weight 199 lb 12.8 oz (90.629 kg).  General: Well developed, well appearing 73 y.o. male in no acute distress. HEENT: Normocephalic, atraumatic. EOMs intact. Sclera nonicteric. Oropharynx clear.  Neck: Supple without bruits. No JVD. Lungs: Respirations regular and unlabored, CTA bilaterally. No wheezes, rales or rhonchi. Heart: RRR. S1, S2 present. No murmurs, rub, S3 or S4. Abdomen: Soft, non-distended.  Extremities: No clubbing, cyanosis or edema. PT/Radials 2+ and equal bilaterally. Psych: Normal affect. Neuro: Alert and oriented X 3. Moves all extremities spontaneously.   Diagnostics Device interrogation today - Normal device function. Threshold and sensing consistent with previous device measurements. Impedance trends stable over time. 1 VT-monitored episode recorded 06/17/2012, 2 minutes in duration, EGM reviewed and consistent with monomorphic VT with CL 350 msec, asymptomatic. Reviewed with Nathaniel Bolton in clinic. Histogram distribution appropriate for patient and level of activity. No changes made this session. Device programmed at appropriate safety margins. Device programmed to optimize intrinsic conduction. Pt re-enrolled in remote follow-up and ICM monitoring.   Assessment and Plan  1. Ischemic cardiomyopathy s/p ICD implant - normal device function - 1 VT-monitored episode as described above; asymptomatic; reviewed with Nathaniel Bolton in clinic today who recommended no programming changes at this time - continue BB - re-enrolled for remote ICD follow-up every 3 months in addition to [facility] clinic - return for follow-up with Nathaniel Bolton in one year  2.  Chronic systolic HF - stable; euvolemic  - OptiVol reviewed and stable - continue medical therapy - enrolled for remote ICM clinic  Signed, Ileene Hutchinson, PA-C 03/04/2013, 2:12 PM

## 2013-06-05 ENCOUNTER — Ambulatory Visit (INDEPENDENT_AMBULATORY_CARE_PROVIDER_SITE_OTHER): Payer: Commercial Managed Care - HMO | Admitting: *Deleted

## 2013-06-05 DIAGNOSIS — I2589 Other forms of chronic ischemic heart disease: Secondary | ICD-10-CM | POA: Diagnosis not present

## 2013-06-05 DIAGNOSIS — I255 Ischemic cardiomyopathy: Secondary | ICD-10-CM

## 2013-06-05 DIAGNOSIS — I5022 Chronic systolic (congestive) heart failure: Secondary | ICD-10-CM | POA: Diagnosis not present

## 2013-06-05 LAB — MDC_IDC_ENUM_SESS_TYPE_INCLINIC
Battery Voltage: 3.01 V
Brady Statistic RV Percent Paced: 0.22 %
HIGH POWER IMPEDANCE MEASURED VALUE: 47 Ohm
HighPow Impedance: 67 Ohm
Lead Channel Impedance Value: 342 Ohm
Lead Channel Pacing Threshold Amplitude: 1 V
Lead Channel Sensing Intrinsic Amplitude: 3.75 mV
Lead Channel Setting Pacing Amplitude: 2.5 V
Lead Channel Setting Pacing Pulse Width: 0.4 ms
Lead Channel Setting Sensing Sensitivity: 0.3 mV
MDC IDC MSMT LEADCHNL RV PACING THRESHOLD PULSEWIDTH: 0.4 ms
MDC IDC MSMT LEADCHNL RV SENSING INTR AMPL: 4.5 mV
MDC IDC SESS DTM: 20150529121619
Zone Setting Detection Interval: 300 ms
Zone Setting Detection Interval: 360 ms
Zone Setting Detection Interval: 400 ms

## 2013-06-05 NOTE — Progress Notes (Signed)
ICD check in clinic. Normal device function. Thresholds and sensing consistent with previous device measurements. Impedance trends stable over time. No evidence of any ventricular arrhythmias.  Histogram distribution appropriate for patient and level of activity. No changes made this session.  Optivol and thoracic impedance abnormal 4/21-4/25.   Device programmed at appropriate safety margins. Device programmed to optimize intrinsic conduction.  Pt enrolled in remote follow-up. Plan to check device every 3 months remotely and in office annually. Patient education completed including shock plan. Alert tones/vibration demonstrated for patient.  [facility] 09/07/13.

## 2013-06-24 ENCOUNTER — Encounter: Payer: Self-pay | Admitting: Internal Medicine

## 2013-08-26 ENCOUNTER — Encounter: Payer: Self-pay | Admitting: Cardiology

## 2013-08-26 ENCOUNTER — Ambulatory Visit (INDEPENDENT_AMBULATORY_CARE_PROVIDER_SITE_OTHER): Payer: Commercial Managed Care - HMO | Admitting: Cardiology

## 2013-08-26 VITALS — BP 114/56 | HR 65 | Ht 70.0 in | Wt 204.0 lb

## 2013-08-26 DIAGNOSIS — I2510 Atherosclerotic heart disease of native coronary artery without angina pectoris: Secondary | ICD-10-CM

## 2013-08-26 DIAGNOSIS — Z95810 Presence of automatic (implantable) cardiac defibrillator: Secondary | ICD-10-CM | POA: Diagnosis not present

## 2013-08-26 DIAGNOSIS — I252 Old myocardial infarction: Secondary | ICD-10-CM

## 2013-08-26 DIAGNOSIS — I2589 Other forms of chronic ischemic heart disease: Secondary | ICD-10-CM

## 2013-08-26 DIAGNOSIS — I5022 Chronic systolic (congestive) heart failure: Secondary | ICD-10-CM | POA: Diagnosis not present

## 2013-08-26 DIAGNOSIS — I255 Ischemic cardiomyopathy: Secondary | ICD-10-CM

## 2013-08-26 NOTE — Patient Instructions (Signed)
Reduce salt intake.  Limit carbohydrates- Sweets, potatoes, rice, pasta, and bread.  Take an extra furosemide 40 mg in the afternoon for 3-4 days then resume 80 mg daily.  I will get a copy of your lab work from Dr. Manson Allan.  I will see you in 6 months.

## 2013-08-27 NOTE — Progress Notes (Signed)
Nathaniel Bolton Date of Birth: [birth year] Medical Record [id number]  History of Present Illness: [name] is seen back today for followup of congestive heart failure.  He has a history of congestive heart failure with ejection fraction of 27%. He also has a history of coronary disease with remote coronary bypass surgery. Since his last he reports his kidney function has worsened. He complains of increased leg edema and weight gain of 7 lbs noted.  Aldactone was discontinued due to CKD. Metformin was stopped due to renal function and he is now on insulin.  Current Outpatient Prescriptions on File Prior to Visit  Medication Sig Dispense Refill  . Ascorbic Acid (VITAMIN C) 1000 MG tablet Take 1,000 mg by mouth daily.      Marland Kitchen aspirin 325 MG tablet Take 325 mg by mouth daily.      . carvedilol (COREG) 12.5 MG tablet Take 6.25 mg by mouth 2 (two) times daily.      . furosemide (LASIX) 40 MG tablet Take 2 tablets (80 mg total) by mouth daily.  30 tablet  3  . glipiZIDE (GLUCOTROL) 10 MG tablet Take 10 mg by mouth 2 (two) times daily before a meal.        . insulin aspart protamine-insulin aspart (NOVOLOG 70/30) (70-30) 100 UNIT/ML injection Inject into the skin. 25 units only as needed      . lisinopril (PRINIVIL,ZESTRIL) 10 MG tablet Take 1 tablet (10 mg total) by mouth daily.  30 tablet  6  . NON FORMULARY Needles, syringes with needles, glucose reagent test strips      . Omega-3 Fatty Acids (FISH OIL PO) Take 2 tablets by mouth 2 (two) times daily.       Marland Kitchen omeprazole (PRILOSEC) 20 MG capsule Take 40 mg by mouth daily.       . Saw Palmetto, Serenoa repens, (SAW PALMETTO PO) Take 1 tablet by mouth daily.       . sertraline (ZOLOFT) 50 MG tablet Take 50 mg by mouth daily.       . simvastatin (ZOCOR) 80 MG tablet Take 80 mg by mouth at bedtime.        Marland Kitchen VITAMIN E PO Take 1 tablet by mouth daily.        No current facility-administered medications on file prior to visit.    Allergies   Allergen Reactions  . Adhesive [Tape]     Testosterone patch adhesive    Past Medical History  Diagnosis Date  . Myocardial infarction, old     x2 in [place]. S/P CABG approx 9 years ago per Dr. Ricard Dillon  . Diabetes mellitus     Type 2  . Hypertension   . CAD (coronary artery disease)   . Erectile dysfunction   . Gout   . Systolic CHF, acute on chronic     EF is 27% per myoview and echo October 2012  . ICD (implantable cardiac defibrillator) in place   . Hyperlipidemia   . Obstructive lung disease     Moderate per prior PFT's    Past Surgical History  Procedure Laterality Date  . Bypass graft      x5  . Cardiac defibrillator placement  2008    by Dr Elonda Husky at [facility]  . Cholecystectomy    . Knee arthroscopy      right  . Umbilical hernia repair    . Cataract surgery      History  Smoking status  .  Former Smoker  Smokeless tobacco  . Not on file    History  Alcohol Use No    Family History  Problem Relation Age of Onset  . Heart failure Mother   . Heart attack Father   . Heart disease Sister     valve replaced  . Heart attack Brother     CABG, aortic grafting    Review of Systems: The review of systems is as noted in history of present illness. All other systems were reviewed and are negative.  Physical Exam: BP 114/56  Pulse 65  Ht 5\' 10"  (1.778 m)  Wt 204 lb (92.534 kg)  BMI 29.27 kg/m2 Patient is very pleasant and in no acute distress. Skin is warm and dry. Color is normal.   Normocephalic/atraumatic. PERRL. Sclera are nonicteric. Neck is supple. No masses. Mild JVD. Lungs are clear. Cardiac exam shows a regular rate and rhythm. There is no gallop or murmur. Abdomen is soft, obese, and nontender. Extremities reveal 1+ pretibial edema. Gait and ROM are intact. No gross neurologic deficits noted.   LABORATORY DATA:    Assessment / Plan: 1. Chronic systolic CHF.  Weight is up and he has increased edema. I will obtain a copy of  his most recent lab work from Dr. Manson Allan. Continue carvedilol, lisinopril. Aldactone held to to renal issues. Will increase lasix to 80 mg in the morning and 40 mg in the afternoon until he returns to dry weight then 80 mg daily. Stressed importance of sodium restriction.  2.  Status post CVA.  3. Coronary disease with remote myocardial infarctions in 1980 and 1990. Status post CABG. Patient is without anginal symptoms. Myoview study in October of 2012 showed evidence of an old infarct without ischemia. He is asymptomatic.  4. Status post ICD implant. ICD check in February satisfactory.  5. CKD.  6. DM now on insulin- reviewed recommendations for low Carb diet.

## 2013-09-07 ENCOUNTER — Encounter: Payer: Commercial Managed Care - HMO | Admitting: *Deleted

## 2013-09-07 ENCOUNTER — Telehealth: Payer: Self-pay | Admitting: Cardiology

## 2013-09-07 NOTE — Telephone Encounter (Signed)
Spoke with pt wife and she stated that due to some health issues for her she would rather her husband be checked in office. Pt wife agreed to 9-10 at 11:30 appt with device clinic.

## 2013-09-17 ENCOUNTER — Ambulatory Visit (INDEPENDENT_AMBULATORY_CARE_PROVIDER_SITE_OTHER): Payer: Commercial Managed Care - HMO | Admitting: *Deleted

## 2013-09-17 DIAGNOSIS — I255 Ischemic cardiomyopathy: Secondary | ICD-10-CM

## 2013-09-17 DIAGNOSIS — I2589 Other forms of chronic ischemic heart disease: Secondary | ICD-10-CM

## 2013-09-17 DIAGNOSIS — I5022 Chronic systolic (congestive) heart failure: Secondary | ICD-10-CM

## 2013-09-17 LAB — MDC_IDC_ENUM_SESS_TYPE_INCLINIC
HIGH POWER IMPEDANCE MEASURED VALUE: 42 Ohm
HIGH POWER IMPEDANCE MEASURED VALUE: 59 Ohm
Lead Channel Impedance Value: 380 Ohm
Lead Channel Pacing Threshold Amplitude: 1.125 V
Lead Channel Pacing Threshold Pulse Width: 0.4 ms
Lead Channel Sensing Intrinsic Amplitude: 4.625 mV
Lead Channel Setting Pacing Amplitude: 2.5 V
Lead Channel Setting Sensing Sensitivity: 0.3 mV
MDC IDC MSMT BATTERY VOLTAGE: 3.03 V
MDC IDC MSMT LEADCHNL RV SENSING INTR AMPL: 6.5 mV
MDC IDC SESS DTM: 20150910113502
MDC IDC SET LEADCHNL RV PACING PULSEWIDTH: 0.4 ms
MDC IDC SET ZONE DETECTION INTERVAL: 360 ms
MDC IDC SET ZONE DETECTION INTERVAL: 400 ms
MDC IDC STAT BRADY RV PERCENT PACED: 0.22 %
Zone Setting Detection Interval: 300 ms

## 2013-09-17 NOTE — Progress Notes (Signed)
ICD check in clinic. Normal device function. Thresholds and sensing consistent with previous device measurements. Impedance trends stable over time. No evidence of any ventricular arrhythmias. Optivol and thoracic impedance abnormal 8/18 ongoing.  The patient has been seen by Laverna Peace, NP for medication management of his diuretics.   Histogram distribution appropriate for patient and level of activity. No changes made this session. Device programmed at appropriate safety margins. Device programmed to optimize intrinsic conduction. Patient education completed including shock plan. Alert tones/vibration demonstrated for patient.  ROV in [place]

## 2013-09-23 ENCOUNTER — Encounter: Payer: Self-pay | Admitting: Internal Medicine

## 2013-12-17 ENCOUNTER — Ambulatory Visit (INDEPENDENT_AMBULATORY_CARE_PROVIDER_SITE_OTHER): Payer: Commercial Managed Care - HMO | Admitting: *Deleted

## 2013-12-17 DIAGNOSIS — I5022 Chronic systolic (congestive) heart failure: Secondary | ICD-10-CM

## 2013-12-17 DIAGNOSIS — Z95810 Presence of automatic (implantable) cardiac defibrillator: Secondary | ICD-10-CM

## 2013-12-17 DIAGNOSIS — I255 Ischemic cardiomyopathy: Secondary | ICD-10-CM

## 2013-12-17 LAB — MDC_IDC_ENUM_SESS_TYPE_INCLINIC
Battery Voltage: 2.98 V
Date Time Interrogation Session: 20151210093129
HIGH POWER IMPEDANCE MEASURED VALUE: 59 Ohm
HighPow Impedance: 47 Ohm
Lead Channel Impedance Value: 342 Ohm
Lead Channel Pacing Threshold Amplitude: 1 V
Lead Channel Pacing Threshold Pulse Width: 0.4 ms
Lead Channel Setting Pacing Amplitude: 2.5 V
Lead Channel Setting Pacing Pulse Width: 0.4 ms
Lead Channel Setting Sensing Sensitivity: 0.3 mV
MDC IDC MSMT LEADCHNL RV SENSING INTR AMPL: 3.75 mV
MDC IDC MSMT LEADCHNL RV SENSING INTR AMPL: 4.875 mV
MDC IDC STAT BRADY RV PERCENT PACED: 0.44 %
Zone Setting Detection Interval: 300 ms
Zone Setting Detection Interval: 360 ms
Zone Setting Detection Interval: 400 ms

## 2013-12-17 NOTE — Progress Notes (Signed)
ICD check in clinic. Normal device function. Threshold and sensing consistent with previous device measurements. Impedance trends stable over time. No evidence of any ventricular arrhythmias. Histogram distribution appropriate for patient and level of activity. OptiVol up since 10/28/13. No changes made this session. Device programmed at appropriate safety margins. Device programmed to optimize intrinsic conduction. Battery @2 .98V (ERI=2.63V). Alert tones demonstrated for patient, pt knows to call clinic if heard. ROV w/ Dr. Rayann Heman 03/22/14.

## 2014-01-18 DIAGNOSIS — M7021 Olecranon bursitis, right elbow: Secondary | ICD-10-CM | POA: Diagnosis not present

## 2014-02-01 DIAGNOSIS — I509 Heart failure, unspecified: Secondary | ICD-10-CM | POA: Diagnosis not present

## 2014-02-01 DIAGNOSIS — I10 Essential (primary) hypertension: Secondary | ICD-10-CM | POA: Diagnosis not present

## 2014-02-01 DIAGNOSIS — E1129 Type 2 diabetes mellitus with other diabetic kidney complication: Secondary | ICD-10-CM | POA: Diagnosis not present

## 2014-02-01 DIAGNOSIS — M1000 Idiopathic gout, unspecified site: Secondary | ICD-10-CM | POA: Diagnosis not present

## 2014-02-01 DIAGNOSIS — N189 Chronic kidney disease, unspecified: Secondary | ICD-10-CM | POA: Diagnosis not present

## 2014-02-01 DIAGNOSIS — Z6828 Body mass index (BMI) 28.0-28.9, adult: Secondary | ICD-10-CM | POA: Diagnosis not present

## 2014-02-01 DIAGNOSIS — E785 Hyperlipidemia, unspecified: Secondary | ICD-10-CM | POA: Diagnosis not present

## 2014-02-12 DIAGNOSIS — E119 Type 2 diabetes mellitus without complications: Secondary | ICD-10-CM | POA: Diagnosis not present

## 2014-02-12 DIAGNOSIS — E780 Pure hypercholesterolemia: Secondary | ICD-10-CM | POA: Diagnosis not present

## 2014-02-12 DIAGNOSIS — Z0189 Encounter for other specified special examinations: Secondary | ICD-10-CM | POA: Diagnosis not present

## 2014-02-12 DIAGNOSIS — Z7982 Long term (current) use of aspirin: Secondary | ICD-10-CM | POA: Diagnosis not present

## 2014-02-12 DIAGNOSIS — Z01818 Encounter for other preprocedural examination: Secondary | ICD-10-CM | POA: Diagnosis not present

## 2014-02-12 DIAGNOSIS — I10 Essential (primary) hypertension: Secondary | ICD-10-CM | POA: Diagnosis not present

## 2014-02-12 DIAGNOSIS — R079 Chest pain, unspecified: Secondary | ICD-10-CM | POA: Diagnosis not present

## 2014-02-12 DIAGNOSIS — Z0000 Encounter for general adult medical examination without abnormal findings: Secondary | ICD-10-CM | POA: Diagnosis not present

## 2014-02-12 DIAGNOSIS — Z794 Long term (current) use of insulin: Secondary | ICD-10-CM | POA: Diagnosis not present

## 2014-02-16 ENCOUNTER — Encounter: Payer: Self-pay | Admitting: Internal Medicine

## 2014-02-17 ENCOUNTER — Encounter: Payer: Self-pay | Admitting: Cardiology

## 2014-03-19 ENCOUNTER — Ambulatory Visit: Payer: Commercial Managed Care - HMO | Admitting: Cardiology

## 2014-03-22 ENCOUNTER — Ambulatory Visit (INDEPENDENT_AMBULATORY_CARE_PROVIDER_SITE_OTHER): Payer: Commercial Managed Care - HMO | Admitting: Internal Medicine

## 2014-03-22 ENCOUNTER — Other Ambulatory Visit: Payer: Self-pay

## 2014-03-22 ENCOUNTER — Encounter: Payer: Self-pay | Admitting: Internal Medicine

## 2014-03-22 VITALS — BP 124/62 | HR 63 | Ht 70.0 in | Wt 204.8 lb

## 2014-03-22 DIAGNOSIS — I5022 Chronic systolic (congestive) heart failure: Secondary | ICD-10-CM

## 2014-03-22 DIAGNOSIS — Z95810 Presence of automatic (implantable) cardiac defibrillator: Secondary | ICD-10-CM

## 2014-03-22 DIAGNOSIS — I255 Ischemic cardiomyopathy: Secondary | ICD-10-CM | POA: Diagnosis not present

## 2014-03-22 LAB — MDC_IDC_ENUM_SESS_TYPE_INCLINIC
Brady Statistic RV Percent Paced: 0.4 %
Date Time Interrogation Session: 20160314104435
HIGH POWER IMPEDANCE MEASURED VALUE: 43 Ohm
HighPow Impedance: 60 Ohm
Lead Channel Pacing Threshold Amplitude: 1 V
Lead Channel Sensing Intrinsic Amplitude: 3.375 mV
Lead Channel Sensing Intrinsic Amplitude: 6.75 mV
Lead Channel Setting Pacing Pulse Width: 0.4 ms
Lead Channel Setting Sensing Sensitivity: 0.3 mV
MDC IDC MSMT BATTERY VOLTAGE: 3.01 V
MDC IDC MSMT LEADCHNL RV IMPEDANCE VALUE: 342 Ohm
MDC IDC MSMT LEADCHNL RV PACING THRESHOLD PULSEWIDTH: 0.4 ms
MDC IDC SET LEADCHNL RV PACING AMPLITUDE: 2.5 V
MDC IDC SET ZONE DETECTION INTERVAL: 400 ms
Zone Setting Detection Interval: 300 ms
Zone Setting Detection Interval: 360 ms

## 2014-03-22 LAB — BASIC METABOLIC PANEL
BUN: 33 mg/dL — AB (ref 6–23)
CO2: 33 mEq/L — ABNORMAL HIGH (ref 19–32)
CREATININE: 1.56 mg/dL — AB (ref 0.40–1.50)
Calcium: 9.1 mg/dL (ref 8.4–10.5)
Chloride: 99 mEq/L (ref 96–112)
GFR: 46.68 mL/min — ABNORMAL LOW (ref >60.00)
Glucose, Bld: 322 mg/dL — ABNORMAL HIGH (ref 70–99)
Potassium: 4.5 mEq/L (ref 3.5–5.1)
Sodium: 135 mEq/L (ref 135–145)

## 2014-03-22 LAB — BRAIN NATRIURETIC PEPTIDE: Pro B Natriuretic peptide (BNP): 79 pg/mL (ref 0.0–100.0)

## 2014-03-22 NOTE — Progress Notes (Signed)
Electrophysiology Office Note   Date:  03/22/2014   ID:  Nathaniel Bolton, DOB 1940-01-31, MRN [id number]  PCP:  Laverna Peace, NP  Cardiologist:  Dr Martinique Primary Electrophysiologist: Thompson Grayer, MD    Chief Complaint  Patient presents with  . Follow-up    shortness of breath     History of Present Illness: Nathaniel Bolton is a 74 y.o. male who presents today for electrophysiology evaluation.   He is doing reasonably well.  He remains active in his yard without limitation.  He has SOB with moderate activity but feels that this is mostly controlled.  He denies CP or edema.  Today, he denies symptoms of palpitations, claudication, dizziness, presyncope, syncope, bleeding, or neurologic sequela. The patient is tolerating medications without difficulties and is otherwise without complaint today.    Past Medical History  Diagnosis Date  . Myocardial infarction, old     x2 in [place]. S/P CABG approx 9 years ago per Dr. Ricard Dillon  . Diabetes mellitus     Type 2  . Hypertension   . CAD (coronary artery disease)   . Erectile dysfunction   . Gout   . Systolic CHF, acute on chronic     EF is 27% per myoview and echo October 2012  . ICD (implantable cardiac defibrillator) in place   . Hyperlipidemia   . Obstructive lung disease     Moderate per prior PFT's   Past Surgical History  Procedure Laterality Date  . Bypass graft      x5  . Cardiac defibrillator placement  2008    by Dr Elonda Husky at [facility]  . Cholecystectomy    . Knee arthroscopy      right  . Umbilical hernia repair    . Cataract surgery       Current Outpatient Prescriptions  Medication Sig Dispense Refill  . Ascorbic Acid (VITAMIN C) 1000 MG tablet Take 1,000 mg by mouth daily.    Marland Kitchen aspirin 325 MG tablet Take 325 mg by mouth daily.    . carvedilol (COREG) 12.5 MG tablet Take 6.25 mg by mouth 2 (two) times daily.    . furosemide (LASIX) 40 MG tablet Take 80 mg by mouth daily. Can take 1 EXTRA  tablet by mouth daily as needed for swelling in his feet    . glipiZIDE (GLUCOTROL) 10 MG tablet Take 10 mg by mouth 2 (two) times daily before a meal.      . insulin aspart protamine-insulin aspart (NOVOLOG 70/30) (70-30) 100 UNIT/ML injection Inject 20 Units into the skin 2 (two) times daily with a meal.     . lisinopril (PRINIVIL,ZESTRIL) 10 MG tablet Take 1 tablet (10 mg total) by mouth daily. 30 tablet 6  . NITROSTAT 0.4 MG SL tablet Place 0.4 mg under the tongue every 5 (five) minutes as needed for chest pain (MAX 3 TABLETS).     . NON FORMULARY Needles, syringes with needles, glucose reagent test strips    . Omega-3 Fatty Acids (FISH OIL PO) Take 2 tablets by mouth 2 (two) times daily.     Marland Kitchen omeprazole (PRILOSEC) 20 MG capsule Take 40 mg by mouth daily.     . Saw Palmetto, Serenoa repens, (SAW PALMETTO PO) Take 1 tablet by mouth daily.     . sertraline (ZOLOFT) 50 MG tablet Take 50 mg by mouth daily.     . simvastatin (ZOCOR) 80 MG tablet Take 80 mg by mouth at bedtime.      Marland Kitchen  VITAMIN E PO Take 1 tablet by mouth daily.      No current facility-administered medications for this visit.    Allergies:   Adhesive   Social History:  The patient  reports that he has quit smoking. He does not have any smokeless tobacco history on file. He reports that he does not drink alcohol or use illicit drugs.   Family History:  The patient's family history includes Heart attack in his brother and father; Heart disease in his sister; Heart failure in his mother.    ROS:  Please see the history of present illness.   All other systems are reviewed and negative.    PHYSICAL EXAM: VS:  BP 124/62 mmHg  Pulse 63  Ht 5\' 10"  (1.778 m)  Wt 204 lb 12.8 oz (92.897 kg)  BMI 29.39 kg/m2 , BMI Body mass index is 29.39 kg/(m^2). GEN: Well nourished, well developed, in no acute distress HEENT: normal Neck: no JVD, carotid bruits, or masses Cardiac: RRR; no murmurs, rubs, or gallops,no edema  Respiratory:   clear to auscultation bilaterally, normal work of breathing GI: soft, nontender, nondistended, + BS MS: no deformity or atrophy Skin: warm and dry, device pocket is well healed Neuro:  Strength and sensation are intact Psych: euthymic mood, full affect  EKG:  EKG is ordered today. The ekg ordered today shows sinus rhythm, IVCD (QRS 128 msec), nonspecific St/T changes are similar to 2/15  Device interrogation is reviewed today in detail.  See PaceArt for details.   Recent Labs: No results found for requested labs within last 365 days.    Lipid Panel  No results found for: CHOL, TRIG, HDL, CHOLHDL, VLDL, LDLCALC, LDLDIRECT   Wt Readings from Last 3 Encounters:  03/22/14 204 lb 12.8 oz (92.897 kg)  08/26/13 204 lb (92.534 kg)  03/04/13 199 lb 12.8 oz (90.629 kg)      Other studies Reviewed: Additional studies/ records that were reviewed today include: Dr Morrison Old notes   ASSESSMENT AND PLAN:  1.  Ischemic CM/ chronic systolic dysfunction Normal ICD function See Pace Art report No changes today optivol is elevated Will enroll in ICM clinic with [place] BMET, BNP today which will be helpful for optivol management going forward 2 gram sodium diet advised  2. CRI No recent labs here Will obtain bmet today  3. HTN Stable No change required today  4. Obesity Weight loss is advised/ regular exercise encouraged    Current medicines are reviewed at length with the patient today.   The patient does not have concerns regarding his medicines.  The following changes were made today:  none   Follow-up: carelink, follow-up with Dr Martinique as scheduled, return to see EP NP in 1 year  Signed, Thompson Grayer, MD  03/22/2014 10:12 AM     [facility] [place] [address] [phone] (office) [phone] (fax)

## 2014-03-22 NOTE — Addendum Note (Signed)
Addended by: Janan Halter F on: 03/22/2014 10:53 AM   Modules accepted: Orders

## 2014-03-22 NOTE — Patient Instructions (Signed)
Remote monitoring is used to monitor your Pacemaker or ICD from home. This monitoring reduces the number of office visits required to check your device to one time per year. It allows [place] to keep an eye on the functioning of your device to ensure it is working properly. You are scheduled for a device check from home on 06/21/14. You may send your transmission at any time that day. If you have a wireless device, the transmission will be sent automatically. After your physician reviews your transmission, you will receive a postcard with your next transmission date.  Your physician wants you to follow-up in: 12 months with Chanetta Marshall, NP You will receive a reminder letter in the mail two months in advance. If you don't receive a letter, please call our office to schedule the follow-up appointment.  Your physician recommends that you return for lab work today: BMP/BNP  Low-Sodium Eating Plan Sodium raises blood pressure and causes water to be held in the body. Getting less sodium from food will help lower your blood pressure, reduce any swelling, and protect your heart, liver, and kidneys. We get sodium by adding salt (sodium chloride) to food. Most of our sodium comes from canned, boxed, and frozen foods. Restaurant foods, fast foods, and pizza are also very high in sodium. Even if you take medicine to lower your blood pressure or to reduce fluid in your body, getting less sodium from your food is important. WHAT IS MY PLAN? Most people should limit their sodium intake to 2,300 mg a day. Your health care provider recommends that you limit your sodium intake to 2 grams a day.  WHAT DO I NEED TO KNOW ABOUT THIS EATING PLAN? For the low-sodium eating plan, you will follow these general guidelines:  Choose foods with a % Daily Value for sodium of less than 5% (as listed on the food label).   Use salt-free seasonings or herbs instead of table salt or sea salt.   Check with your health care provider or  pharmacist before using salt substitutes.   Eat fresh foods.  Eat more vegetables and fruits.  Limit canned vegetables. If you do use them, rinse them well to decrease the sodium.   Limit cheese to 1 oz (28 g) per day.   Eat lower-sodium products, often labeled as "lower sodium" or "no salt added."  Avoid foods that contain monosodium glutamate (MSG). MSG is sometimes added to [place] food and some canned foods.  Check food labels (Nutrition Facts labels) on foods to learn how much sodium is in one serving.  Eat more home-cooked food and less restaurant, buffet, and fast food.  When eating at a restaurant, ask that your food be prepared with less salt or none, if possible.  HOW DO I READ FOOD LABELS FOR SODIUM INFORMATION? The Nutrition Facts label lists the amount of sodium in one serving of the food. If you eat more than one serving, you must multiply the listed amount of sodium by the number of servings. Food labels may also identify foods as:  Sodium free--Less than 5 mg in a serving.  Very low sodium--35 mg or less in a serving.  Low sodium--140 mg or less in a serving.  Light in sodium--50% less sodium in a serving. For example, if a food that usually has 300 mg of sodium is changed to become light in sodium, it will have 150 mg of sodium.  Reduced sodium--25% less sodium in a serving. For example, if a food that  usually has 400 mg of sodium is changed to reduced sodium, it will have 300 mg of sodium. WHAT FOODS CAN I EAT? Grains Low-sodium cereals, including oats, puffed wheat and rice, and shredded wheat cereals. Low-sodium crackers. Unsalted rice and pasta. Lower-sodium bread.  Vegetables Frozen or fresh vegetables. Low-sodium or reduced-sodium canned vegetables. Low-sodium or reduced-sodium tomato sauce and paste. Low-sodium or reduced-sodium tomato and vegetable juices.  Fruits Fresh, frozen, and canned fruit. Fruit juice.  Meat and Other Protein  Products Low-sodium canned tuna and salmon. Fresh or frozen meat, poultry, seafood, and fish. Lamb. Unsalted nuts. Dried beans, peas, and lentils without added salt. Unsalted canned beans. Homemade soups without salt. Eggs.  Dairy Milk. Soy milk. Ricotta cheese. Low-sodium or reduced-sodium cheeses. Yogurt.  Condiments Fresh and dried herbs and spices. Salt-free seasonings. Onion and garlic powders. Low-sodium varieties of mustard and ketchup. Lemon juice.  Fats and Oils Reduced-sodium salad dressings. Unsalted butter.  Other Unsalted popcorn and pretzels.  The items listed above may not be a complete list of recommended foods or beverages. Contact your dietitian for more options. WHAT FOODS ARE NOT RECOMMENDED? Grains Instant hot cereals. Bread stuffing, pancake, and biscuit mixes. Croutons. Seasoned rice or pasta mixes. Noodle soup cups. Boxed or frozen macaroni and cheese. Self-rising flour. Regular salted crackers. Vegetables Regular canned vegetables. Regular canned tomato sauce and paste. Regular tomato and vegetable juices. Frozen vegetables in sauces. Salted french fries. Olives. Angie Fava. Relishes. Sauerkraut. Salsa. Meat and Other Protein Products Salted, canned, smoked, spiced, or pickled meats, seafood, or fish. Bacon, ham, sausage, hot dogs, corned beef, chipped beef, and packaged luncheon meats. Salt pork. Jerky. Pickled herring. Anchovies, regular canned tuna, and sardines. Salted nuts. Dairy Processed cheese and cheese spreads. Cheese curds. Blue cheese and cottage cheese. Buttermilk.  Condiments Onion and garlic salt, seasoned salt, table salt, and sea salt. Canned and packaged gravies. Worcestershire sauce. Tartar sauce. Barbecue sauce. Teriyaki sauce. Soy sauce, including reduced sodium. Steak sauce. Fish sauce. Oyster sauce. Cocktail sauce. Horseradish. Regular ketchup and mustard. Meat flavorings and tenderizers. Bouillon cubes. Hot sauce. Tabasco sauce.  Marinades. Taco seasonings. Relishes. Fats and Oils Regular salad dressings. Salted butter. Margarine. Ghee. Bacon fat.  Other Potato and tortilla chips. Corn chips and puffs. Salted popcorn and pretzels. Canned or dried soups. Pizza. Frozen entrees and pot pies.  The items listed above may not be a complete list of foods and beverages to avoid. Contact your dietitian for more information. Document Released: 06/16/2001 Document Revised: 12/30/2012 Document Reviewed: 10/29/2012 [facility] Patient Information 2015 [place], [place]. This information is not intended to replace advice given to you by your health care provider. Make sure you discuss any questions you have with your health care provider.

## 2014-03-23 DIAGNOSIS — N189 Chronic kidney disease, unspecified: Secondary | ICD-10-CM | POA: Diagnosis not present

## 2014-03-23 DIAGNOSIS — E1129 Type 2 diabetes mellitus with other diabetic kidney complication: Secondary | ICD-10-CM | POA: Diagnosis not present

## 2014-03-23 DIAGNOSIS — I509 Heart failure, unspecified: Secondary | ICD-10-CM | POA: Diagnosis not present

## 2014-03-29 DIAGNOSIS — M109 Gout, unspecified: Secondary | ICD-10-CM | POA: Diagnosis not present

## 2014-03-29 DIAGNOSIS — Z6830 Body mass index (BMI) 30.0-30.9, adult: Secondary | ICD-10-CM | POA: Diagnosis not present

## 2014-03-30 ENCOUNTER — Encounter: Payer: Self-pay | Admitting: Internal Medicine

## 2014-04-19 DIAGNOSIS — L03119 Cellulitis of unspecified part of limb: Secondary | ICD-10-CM | POA: Diagnosis not present

## 2014-04-19 DIAGNOSIS — M109 Gout, unspecified: Secondary | ICD-10-CM | POA: Diagnosis not present

## 2014-04-19 DIAGNOSIS — N189 Chronic kidney disease, unspecified: Secondary | ICD-10-CM | POA: Diagnosis not present

## 2014-04-19 DIAGNOSIS — Z6829 Body mass index (BMI) 29.0-29.9, adult: Secondary | ICD-10-CM | POA: Diagnosis not present

## 2014-04-20 ENCOUNTER — Encounter: Payer: Self-pay | Admitting: Cardiology

## 2014-04-20 ENCOUNTER — Ambulatory Visit (INDEPENDENT_AMBULATORY_CARE_PROVIDER_SITE_OTHER): Payer: Commercial Managed Care - HMO | Admitting: Cardiology

## 2014-04-20 VITALS — BP 120/54 | HR 62 | Ht 70.0 in | Wt 207.6 lb

## 2014-04-20 DIAGNOSIS — Z95810 Presence of automatic (implantable) cardiac defibrillator: Secondary | ICD-10-CM

## 2014-04-20 DIAGNOSIS — E1122 Type 2 diabetes mellitus with diabetic chronic kidney disease: Secondary | ICD-10-CM | POA: Diagnosis not present

## 2014-04-20 DIAGNOSIS — I25810 Atherosclerosis of coronary artery bypass graft(s) without angina pectoris: Secondary | ICD-10-CM

## 2014-04-20 DIAGNOSIS — I252 Old myocardial infarction: Secondary | ICD-10-CM

## 2014-04-20 DIAGNOSIS — N189 Chronic kidney disease, unspecified: Secondary | ICD-10-CM

## 2014-04-20 DIAGNOSIS — I5022 Chronic systolic (congestive) heart failure: Secondary | ICD-10-CM | POA: Diagnosis not present

## 2014-04-20 NOTE — Patient Instructions (Signed)
Continue your current therapy  You are clear for your eye surgery.  I will see you in 6 months

## 2014-04-20 NOTE — Progress Notes (Signed)
Nathaniel Bolton Date of Birth: 12-25-1940 Medical Record [id number]  History of Present Illness: Nathaniel Bolton is seen back today for followup of congestive heart failure.  He has a history of congestive heart failure with ejection fraction of 27%. He also has a history of coronary disease with remote coronary bypass surgery. He reports he is doing very well from a cardiac standpoint. No increase edema and weight is stable. No chest pain, dyspnea, or palpitations. Recent severe gout flair with infection. Now on colchicine, steroids, and doxycycline. Since last visit he did have cataract surgery and is planning to have eyebrow surgery. Recently seen in device clinic and ICD showed no therapies delivered.   Current Outpatient Prescriptions on File Prior to Visit  Medication Sig Dispense Refill  . Ascorbic Acid (VITAMIN C) 1000 MG tablet Take 1,000 mg by mouth daily.    Marland Kitchen aspirin 325 MG tablet Take 325 mg by mouth daily.    . carvedilol (COREG) 12.5 MG tablet Take 6.25 mg by mouth 2 (two) times daily.    . furosemide (LASIX) 40 MG tablet Take 80 mg by mouth daily. Can take 1 EXTRA tablet by mouth daily as needed for swelling in his feet    . glipiZIDE (GLUCOTROL) 10 MG tablet Take 10 mg by mouth 2 (two) times daily before a meal.      . insulin aspart protamine-insulin aspart (NOVOLOG 70/30) (70-30) 100 UNIT/ML injection Inject 20 Units into the skin 2 (two) times daily with a meal.     . lisinopril (PRINIVIL,ZESTRIL) 10 MG tablet Take 1 tablet (10 mg total) by mouth daily. 30 tablet 6  . NITROSTAT 0.4 MG SL tablet Place 0.4 mg under the tongue every 5 (five) minutes as needed for chest pain (MAX 3 TABLETS).     . NON FORMULARY Needles, syringes with needles, glucose reagent test strips    . Omega-3 Fatty Acids (FISH OIL PO) Take 2 tablets by mouth 2 (two) times daily.     Marland Kitchen omeprazole (PRILOSEC) 20 MG capsule Take 40 mg by mouth daily.     . Saw Palmetto, Serenoa repens, (SAW PALMETTO PO) Take  1 tablet by mouth daily.     . sertraline (ZOLOFT) 50 MG tablet Take 50 mg by mouth daily.     . simvastatin (ZOCOR) 80 MG tablet Take 80 mg by mouth at bedtime.      Marland Kitchen VITAMIN E PO Take 1 tablet by mouth daily.      No current facility-administered medications on file prior to visit.    Allergies  Allergen Reactions  . Adhesive [Tape] Itching and Rash    Testosterone patch adhesive    Past Medical History  Diagnosis Date  . Myocardial infarction, old     x2 in [place]. S/P CABG approx 9 years ago per Dr. Ricard Dillon  . Diabetes mellitus     Type 2  . Hypertension   . CAD (coronary artery disease)   . Erectile dysfunction   . Gout   . Systolic CHF, acute on chronic     EF is 27% per myoview and echo October 2012  . ICD (implantable cardiac defibrillator) in place   . Hyperlipidemia   . Obstructive lung disease     Moderate per prior PFT's  . CKD stage 3 due to type 2 diabetes mellitus     Past Surgical History  Procedure Laterality Date  . Bypass graft      x5  .  Cardiac defibrillator placement  2008    by Dr Elonda [facility]ky at [facility]  . Cholecystectomy    . Knee arthroscopy      right  . Umbilical hernia repair    . Cataract surgery      History  Smoking status  . Former Smoker  Smokeless tobacco  . Not on file    History  Alcohol Use No    Family History  Problem Relation Age of Onset  . Heart failure Mother   . Heart attack Father   . Heart disease Sister     valve replaced  . Heart attack Brother     CABG, aortic grafting    Review of Systems: The review of systems is as noted in history of present illness. All other systems were reviewed and are negative.  Physical Exam: BP 120/54 mmHg  Pulse 62  Ht 5\' 10"  (1.778 m)  Wt 207 lb 9.6 oz (94.167 kg)  BMI 29.79 kg/m2 Patient is very pleasant and in no acute distress. Skin is warm and dry. Color is normal.   Normocephalic/atraumatic. PERRL. Sclera are nonicteric. Neck is supple. No masses.  Mild JVD. Lungs are clear. Cardiac exam shows a regular rate and rhythm. There is no gallop or murmur. Abdomen is soft, obese, and nontender. Extremities reveal tr pretibial edema. Gait and ROM are intact. No gross neurologic deficits noted.   LABORATORY DATA:  Lab Results  Component Value Date   GLUCOSE 322* 03/22/2014   NA 135 03/22/2014   K 4.5 03/22/2014   CL 99 03/22/2014   CREATININE 1.56* 03/22/2014   BUN 33* 03/22/2014   CO2 33* 03/22/2014   BNP_ 79  Assessment / Plan: 1. Chronic systolic CHF.  Well compensated on current therapy. Continue carvedilol, lisinopril. Aldactone held to to renal issues. Continue current lasix dose. Recent BNP normal.  Stressed importance of sodium restriction.  2.  Status post CVA.  3. Coronary disease with remote myocardial infarctions in 1980 and 1990. Status post CABG. Patient is without anginal symptoms. Myoview study in October of 2012 showed evidence of an old infarct without ischemia. He is asymptomatic.  4. Status post ICD implant. ICD check in March satisfactory.  5. CKD stage 3.  6. DM now on insulin- reviewed recommendations for low Carb diet.  7. Gout per primary care.

## 2014-04-26 DIAGNOSIS — Z6829 Body mass index (BMI) 29.0-29.9, adult: Secondary | ICD-10-CM | POA: Diagnosis not present

## 2014-04-26 DIAGNOSIS — L03119 Cellulitis of unspecified part of limb: Secondary | ICD-10-CM | POA: Diagnosis not present

## 2014-04-26 DIAGNOSIS — N189 Chronic kidney disease, unspecified: Secondary | ICD-10-CM | POA: Diagnosis not present

## 2014-04-26 DIAGNOSIS — M109 Gout, unspecified: Secondary | ICD-10-CM | POA: Diagnosis not present

## 2014-05-06 DIAGNOSIS — M109 Gout, unspecified: Secondary | ICD-10-CM | POA: Diagnosis not present

## 2014-05-06 DIAGNOSIS — Z9181 History of falling: Secondary | ICD-10-CM | POA: Diagnosis not present

## 2014-05-06 DIAGNOSIS — L03119 Cellulitis of unspecified part of limb: Secondary | ICD-10-CM | POA: Diagnosis not present

## 2014-05-06 DIAGNOSIS — Z6829 Body mass index (BMI) 29.0-29.9, adult: Secondary | ICD-10-CM | POA: Diagnosis not present

## 2014-06-04 DIAGNOSIS — E785 Hyperlipidemia, unspecified: Secondary | ICD-10-CM | POA: Diagnosis not present

## 2014-06-04 DIAGNOSIS — M1000 Idiopathic gout, unspecified site: Secondary | ICD-10-CM | POA: Diagnosis not present

## 2014-06-04 DIAGNOSIS — N189 Chronic kidney disease, unspecified: Secondary | ICD-10-CM | POA: Diagnosis not present

## 2014-06-04 DIAGNOSIS — I509 Heart failure, unspecified: Secondary | ICD-10-CM | POA: Diagnosis not present

## 2014-06-04 DIAGNOSIS — E1129 Type 2 diabetes mellitus with other diabetic kidney complication: Secondary | ICD-10-CM | POA: Diagnosis not present

## 2014-06-04 DIAGNOSIS — Z6828 Body mass index (BMI) 28.0-28.9, adult: Secondary | ICD-10-CM | POA: Diagnosis not present

## 2014-06-04 DIAGNOSIS — I10 Essential (primary) hypertension: Secondary | ICD-10-CM | POA: Diagnosis not present

## 2014-06-04 DIAGNOSIS — M7021 Olecranon bursitis, right elbow: Secondary | ICD-10-CM | POA: Diagnosis not present

## 2014-06-15 DIAGNOSIS — M7021 Olecranon bursitis, right elbow: Secondary | ICD-10-CM | POA: Diagnosis not present

## 2014-06-21 ENCOUNTER — Telehealth: Payer: Self-pay | Admitting: Cardiology

## 2014-06-21 ENCOUNTER — Encounter: Payer: Commercial Managed Care - HMO | Admitting: *Deleted

## 2014-06-21 NOTE — Telephone Encounter (Signed)
Confirmed remote transmission w/ pt wife.   

## 2014-06-22 ENCOUNTER — Encounter: Payer: Self-pay | Admitting: Cardiology

## 2014-07-13 DIAGNOSIS — Z6829 Body mass index (BMI) 29.0-29.9, adult: Secondary | ICD-10-CM | POA: Diagnosis not present

## 2014-07-13 DIAGNOSIS — M109 Gout, unspecified: Secondary | ICD-10-CM | POA: Diagnosis not present

## 2014-07-22 ENCOUNTER — Telehealth: Payer: Self-pay | Admitting: Cardiology

## 2014-07-22 ENCOUNTER — Ambulatory Visit (INDEPENDENT_AMBULATORY_CARE_PROVIDER_SITE_OTHER): Payer: Commercial Managed Care - HMO | Admitting: *Deleted

## 2014-07-22 ENCOUNTER — Encounter: Payer: Self-pay | Admitting: *Deleted

## 2014-07-22 DIAGNOSIS — I5022 Chronic systolic (congestive) heart failure: Secondary | ICD-10-CM

## 2014-07-22 DIAGNOSIS — I255 Ischemic cardiomyopathy: Secondary | ICD-10-CM | POA: Diagnosis not present

## 2014-07-22 DIAGNOSIS — Z95810 Presence of automatic (implantable) cardiac defibrillator: Secondary | ICD-10-CM

## 2014-07-22 NOTE — Progress Notes (Signed)
EPIC Encounter for ICM Monitoring  Patient Name: Nathaniel Bolton is a 74 y.o. male Date: 07/22/2014 Primary Care Physican: Laverna Peace, NP Primary Cardiologist: [place] Electrophysiologist: Allred Dry Weight: 188 lbs       In the past month, have you:  1. Gained more than 2 pounds in a day or more than 5 pounds in a week? no  2. Had changes in your medications (with verification of current medications)? no  3. Had more shortness of breath than is usual for you? no  4. Limited your activity because of shortness of breath? no  5. Not been able to sleep because of shortness of breath? no  6. Had increased swelling in your feet or ankles? no  7. Had symptoms of dehydration (dizziness, dry mouth, increased thirst, decreased urine output) no  8. Had changes in sodium restriction? no  9. Been compliant with medication? Yes   ICM trend:   Follow-up plan: ICM clinic phone appointment: 08/23/14. This is my first ICM encounter with the patient. I spoke with his wife today as he was unavailable. She reports that he is doing well from a cardiac status. Impedence is at baseline for the patient. No changes made today.   Copy of note sent to patient's primary care physician, primary cardiologist, and device following physician.  Alvis Lemmings, RN, BSN 07/22/2014 4:30 PM

## 2014-07-22 NOTE — Telephone Encounter (Signed)
Confirmed remote transmission w/ pt wife.   

## 2014-07-22 NOTE — Addendum Note (Signed)
Addended by: Alvis Lemmings C on: 07/22/2014 04:33 PM   Modules accepted: Level of Service

## 2014-07-22 NOTE — Progress Notes (Signed)
Remote ICD transmission.   

## 2014-07-26 LAB — CUP PACEART REMOTE DEVICE CHECK
Brady Statistic RV Percent Paced: 0.19 %
Date Time Interrogation Session: 20160714175752
HIGH POWER IMPEDANCE MEASURED VALUE: 49 Ohm
HighPow Impedance: 65 Ohm
Lead Channel Pacing Threshold Amplitude: 1 V
Lead Channel Pacing Threshold Pulse Width: 0.4 ms
Lead Channel Sensing Intrinsic Amplitude: 4.5 mV
Lead Channel Sensing Intrinsic Amplitude: 4.5 mV
Lead Channel Setting Pacing Pulse Width: 0.4 ms
MDC IDC MSMT BATTERY VOLTAGE: 2.96 V
MDC IDC MSMT LEADCHNL RV IMPEDANCE VALUE: 342 Ohm
MDC IDC SET LEADCHNL RV PACING AMPLITUDE: 2.5 V
MDC IDC SET LEADCHNL RV SENSING SENSITIVITY: 0.3 mV
MDC IDC SET ZONE DETECTION INTERVAL: 300 ms
MDC IDC SET ZONE DETECTION INTERVAL: 360 ms
MDC IDC SET ZONE DETECTION INTERVAL: 400 ms

## 2014-08-09 DIAGNOSIS — I472 Ventricular tachycardia, unspecified: Secondary | ICD-10-CM

## 2014-08-09 HISTORY — DX: Ventricular tachycardia: I47.2

## 2014-08-09 HISTORY — DX: Ventricular tachycardia, unspecified: I47.20

## 2014-08-13 ENCOUNTER — Encounter: Payer: Self-pay | Admitting: Cardiology

## 2014-08-13 ENCOUNTER — Encounter: Payer: Self-pay | Admitting: Internal Medicine

## 2014-08-23 ENCOUNTER — Ambulatory Visit (INDEPENDENT_AMBULATORY_CARE_PROVIDER_SITE_OTHER): Payer: Commercial Managed Care - HMO | Admitting: *Deleted

## 2014-08-23 ENCOUNTER — Telehealth: Payer: Self-pay | Admitting: Cardiology

## 2014-08-23 DIAGNOSIS — I5022 Chronic systolic (congestive) heart failure: Secondary | ICD-10-CM | POA: Diagnosis not present

## 2014-08-23 DIAGNOSIS — Z95810 Presence of automatic (implantable) cardiac defibrillator: Secondary | ICD-10-CM

## 2014-08-23 NOTE — Telephone Encounter (Signed)
Attempted to confirm remote transmission with pt. No answer and was unable to leave a message.   

## 2014-08-25 NOTE — Progress Notes (Signed)
EPIC Encounter for ICM Monitoring  Patient Name: Nathaniel Bolton is a 74 y.o. male Date: 08/25/2014 Primary Care Physican: Laverna Peace, NP Primary Cardiologist: [place] Electrophysiologist: Allred Dry Weight: 185 lbs       In the past month, have you:  1. Gained more than 2 pounds in a day or more than 5 pounds in a week? no  2. Had changes in your medications (with verification of current medications)? no  3. Had more shortness of breath than is usual for you? no  4. Limited your activity because of shortness of breath? no  5. Not been able to sleep because of shortness of breath? no  6. Had increased swelling in your feet or ankles? no  7. Had symptoms of dehydration (dizziness, dry mouth, increased thirst, decreased urine output) no  8. Had changes in sodium restriction? no  9. Been compliant with medication? Yes   ICM trend:   Follow-up plan: ICM clinic phone appointment 09/27/2014.  Patient gave verbal permission to speak with wife.  Wife reported she completed DPR and should be on file.   No changes today.  Copy of note sent to patient's primary care physician, primary cardiologist, and device following physician.  [name], RN, CCM 08/25/2014 12:07 PM

## 2014-09-01 ENCOUNTER — Telehealth: Payer: Self-pay | Admitting: Internal Medicine

## 2014-09-01 DIAGNOSIS — I472 Ventricular tachycardia: Secondary | ICD-10-CM | POA: Diagnosis not present

## 2014-09-01 DIAGNOSIS — I252 Old myocardial infarction: Secondary | ICD-10-CM | POA: Diagnosis not present

## 2014-09-01 DIAGNOSIS — R079 Chest pain, unspecified: Secondary | ICD-10-CM | POA: Diagnosis not present

## 2014-09-01 DIAGNOSIS — I10 Essential (primary) hypertension: Secondary | ICD-10-CM | POA: Diagnosis not present

## 2014-09-01 DIAGNOSIS — E119 Type 2 diabetes mellitus without complications: Secondary | ICD-10-CM | POA: Diagnosis not present

## 2014-09-01 DIAGNOSIS — Z794 Long term (current) use of insulin: Secondary | ICD-10-CM | POA: Diagnosis not present

## 2014-09-01 DIAGNOSIS — E780 Pure hypercholesterolemia: Secondary | ICD-10-CM | POA: Diagnosis not present

## 2014-09-01 DIAGNOSIS — Z95810 Presence of automatic (implantable) cardiac defibrillator: Secondary | ICD-10-CM | POA: Diagnosis not present

## 2014-09-01 DIAGNOSIS — R072 Precordial pain: Secondary | ICD-10-CM | POA: Diagnosis not present

## 2014-09-01 NOTE — Telephone Encounter (Signed)
Called about patient receiving an ICD shock this evening. Spoke to [employer] with [employer]. Patient had VT for 27 minutes before accelerating from 175/min into the VF zone at 250. ATP did not terminate the arrhythmia and a single ICD shock restored NSR. I have recommended that Mrs. Hayes reprogram the patient's device to include a VT zone at 170/min and include 4 burst and 4 ramp pacing attempts before delivering a shock. He has been instructed to call the office for a followup.  Mikle Bosworth.D.

## 2014-09-07 ENCOUNTER — Ambulatory Visit (INDEPENDENT_AMBULATORY_CARE_PROVIDER_SITE_OTHER): Payer: Commercial Managed Care - HMO | Admitting: Internal Medicine

## 2014-09-07 ENCOUNTER — Encounter: Payer: Self-pay | Admitting: Internal Medicine

## 2014-09-07 VITALS — BP 110/60 | HR 60 | Ht 70.0 in | Wt 197.0 lb

## 2014-09-07 DIAGNOSIS — Z95810 Presence of automatic (implantable) cardiac defibrillator: Secondary | ICD-10-CM

## 2014-09-07 DIAGNOSIS — I5022 Chronic systolic (congestive) heart failure: Secondary | ICD-10-CM

## 2014-09-07 DIAGNOSIS — I472 Ventricular tachycardia, unspecified: Secondary | ICD-10-CM

## 2014-09-07 LAB — CUP PACEART INCLINIC DEVICE CHECK
Battery Voltage: 2.88 V
Brady Statistic RV Percent Paced: 0.22 %
Date Time Interrogation Session: 20160830153845
HIGH POWER IMPEDANCE MEASURED VALUE: 54 Ohm
HighPow Impedance: 42 Ohm
Lead Channel Impedance Value: 380 Ohm
Lead Channel Setting Pacing Pulse Width: 0.4 ms
MDC IDC MSMT LEADCHNL RV PACING THRESHOLD AMPLITUDE: 1.25 V
MDC IDC MSMT LEADCHNL RV PACING THRESHOLD PULSEWIDTH: 0.4 ms
MDC IDC MSMT LEADCHNL RV SENSING INTR AMPL: 4.75 mV
MDC IDC SET LEADCHNL RV PACING AMPLITUDE: 2.5 V
MDC IDC SET LEADCHNL RV SENSING SENSITIVITY: 0.3 mV
MDC IDC SET ZONE DETECTION INTERVAL: 270 ms
MDC IDC SET ZONE DETECTION INTERVAL: 350 ms
Zone Setting Detection Interval: 400 ms

## 2014-09-07 NOTE — Assessment & Plan Note (Signed)
He has had no recurrent VT. He denies other symptoms.

## 2014-09-07 NOTE — Progress Notes (Signed)
HPI Mr. Scarcella returns today for followup. He is a pleasant 74 yo man with an ICM, chronic systolic heart failure and VT. He was seen in the ED several weeks ago with VT at 175/min. He was initially below his rate detection cutoff and no therapy was delivered. The patient then accelerated into the VF zone and was treated with appropriate therapy. He feels well now. He has not had any anginal sysmptoms. No palpitations since. Today, he is stable.  Allergies  Allergen Reactions  . Adhesive [Tape] Itching and Rash    Testosterone patch adhesive     Current Outpatient Prescriptions  Medication Sig Dispense Refill  . allopurinol (ZYLOPRIM) 100 MG tablet Take 100 mg by mouth 2 (two) times daily. And as needed    . Ascorbic Acid (VITAMIN C) 1000 MG tablet Take 1,000 mg by mouth daily.    Marland Kitchen aspirin 325 MG tablet Take 325 mg by mouth daily.    . carvedilol (COREG) 12.5 MG tablet Take 6.25 mg by mouth 2 (two) times daily.    . furosemide (LASIX) 40 MG tablet Take 80 mg by mouth daily. Can take 1 EXTRA tablet by mouth daily as needed for swelling in his feet    . glipiZIDE (GLUCOTROL) 10 MG tablet Take 10 mg by mouth 2 (two) times daily before a meal.      . insulin aspart protamine-insulin aspart (NOVOLOG 70/30) (70-30) 100 UNIT/ML injection Inject 20 Units into the skin 2 (two) times daily with a meal.     . lisinopril (PRINIVIL,ZESTRIL) 10 MG tablet Take 1 tablet (10 mg total) by mouth daily. 30 tablet 6  . NITROSTAT 0.4 MG SL tablet Place 0.4 mg under the tongue every 5 (five) minutes as needed for chest pain (MAX 3 TABLETS).     . Omega-3 Fatty Acids (FISH OIL PO) Take 2 tablets by mouth 2 (two) times daily.     Marland Kitchen omeprazole (PRILOSEC) 20 MG capsule Take 40 mg by mouth daily.     . Saw Palmetto, Serenoa repens, (SAW PALMETTO PO) Take 1 tablet by mouth daily.     . sertraline (ZOLOFT) 50 MG tablet Take 50 mg by mouth daily.     . simvastatin (ZOCOR) 80 MG tablet Take 80 mg by mouth at  bedtime.      Marland Kitchen VITAMIN E PO Take 1 tablet by mouth daily.     . colchicine 0.6 MG tablet Take 0.6 mg by mouth daily.    . NON FORMULARY Needles, syringes with needles, glucose reagent test strips     No current facility-administered medications for this visit.     Past Medical History  Diagnosis Date  . Myocardial infarction, old     x2 in [place]. S/P CABG approx 9 years ago per Dr. Ricard Dillon  . Diabetes mellitus     Type 2  . Hypertension   . CAD (coronary artery disease)   . Erectile dysfunction   . Gout   . Systolic CHF, acute on chronic     EF is 27% per myoview and echo October 2012  . ICD (implantable cardiac defibrillator) in place   . Hyperlipidemia   . Obstructive lung disease     Moderate per prior PFT's  . CKD stage 3 due to type 2 diabetes mellitus     ROS:   All systems reviewed and negative except as noted in the HPI.   Past Surgical History  Procedure Laterality Date  .  Bypass graft      x5  . Cardiac defibrillator placement  2008    by Dr Elonda Husky at [facility]  . Cholecystectomy    . Knee arthroscopy      right  . Umbilical hernia repair    . Cataract surgery       Family History  Problem Relation Age of Onset  . Heart failure Mother   . Heart attack Father   . Heart disease Sister     valve replaced  . Heart attack Brother     CABG, aortic grafting     Social History   Social History  . Marital Status: Married    Spouse Name: N/A  . Number of Children: 4  . Years of Education: N/A   Occupational History  . heavy Radio producer    Social History Main Topics  . Smoking status: Former Research scientist (life sciences)  . Smokeless tobacco: Not on file  . Alcohol Use: No  . Drug Use: No  . Sexual Activity: Not on file   Other Topics Concern  . Not on file   Social History Narrative     BP 110/60 mmHg  Pulse 60  Ht 5\' 10"  (1.778 m)  Wt 197 lb (89.359 kg)  BMI 28.27 kg/m2  Physical Exam:  Well appearing 74 yo man, NAD HEENT:  Unremarkable Neck:  6 cm JVD, no thyromegally Lymphatics:  No adenopathy Back:  No CVA tenderness Lungs:  Clear with no wheezes, rales, or rhonchi.  HEART:  Regular rate r hythm, no murmurs, no rubs, no clicks Abd:  soft, positive bowel sounds, no organomegally, no rebound, no guarding Ext:  2 plus pulses, no edema, no cyanosis, no clubbing Skin:  No rashes no nodules Neuro:  CN II through XII intact, motor grossly intact  EKG -  NSR with IVCD  DEVICE  Normal device function.  See PaceArt for details.   Assess/Plan:

## 2014-09-07 NOTE — Patient Instructions (Signed)
Medication Instructions:  Your physician recommends that you continue on your current medications as directed. Please refer to the Current Medication list given to you today.   Labwork: None ordered  Testing/Procedures: None ordered  Follow-Up: Your physician wants you to follow-up in: 6 months with Dr Knox Saliva will receive a reminder letter in the mail two months in advance. If you don't receive a letter, please call our office to schedule the follow-up appointment.  Remote monitoring is used to monitor your  ICD from home. This monitoring reduces the number of office visits required to check your device to one time per year. It allows [place] to keep an eye on the functioning of your device to ensure it is working properly. You are scheduled for a device check from home on 12/07/14. You may send your transmission at any time that day. If you have a wireless device, the transmission will be sent automatically. After your physician reviews your transmission, you will receive a postcard with your next transmission date.     Any Other Special Instructions Will Be Listed Below (If Applicable).

## 2014-09-07 NOTE — Assessment & Plan Note (Signed)
His device has been reprogrammed to allow for a monitor zone. His VF zone has been increased as well.

## 2014-09-27 ENCOUNTER — Ambulatory Visit (INDEPENDENT_AMBULATORY_CARE_PROVIDER_SITE_OTHER): Payer: Commercial Managed Care - HMO

## 2014-09-27 ENCOUNTER — Telehealth: Payer: Self-pay | Admitting: Cardiology

## 2014-09-27 DIAGNOSIS — I5022 Chronic systolic (congestive) heart failure: Secondary | ICD-10-CM | POA: Diagnosis not present

## 2014-09-27 DIAGNOSIS — Z95810 Presence of automatic (implantable) cardiac defibrillator: Secondary | ICD-10-CM | POA: Diagnosis not present

## 2014-09-27 NOTE — Telephone Encounter (Signed)
Confirmed remote transmission w/ pt wife.   

## 2014-09-30 NOTE — Progress Notes (Addendum)
EPIC Encounter for ICM Monitoring  Patient Name: Nathaniel Bolton is a 74 y.o. male Date: 09/30/2014 Primary Care Physican: Laverna Peace, NP Primary Cardiologist: [place] Electrophysiologist: Allred Dry Weight: 191 lbs (185 on 08/25/2014)       In the past month, have you:  1. Gained more than 2 pounds in a day or more than 5 pounds in a week? Yes, gain of 6 pounds in the last couple of weeks.   2. Had changes in your medications (with verification of current medications)? no  3. Had more shortness of breath than is usual for you? no  4. Limited your activity because of shortness of breath? no  5. Not been able to sleep because of shortness of breath? no  6. Had increased swelling in your feet or ankles? No, but has swelling in his stomach.   7. Had symptoms of dehydration (dizziness, dry mouth, increased thirst, decreased urine output) no  8. Had changes in sodium restriction? no  9. Been compliant with medication? Yes   ICM trend: 09/28/2014   Follow-up plan: ICM clinic phone appointment on 10/07/2014.  Optivol impedance below baseline~08/25/2014 to 09/17/2014 and 09/19/2014 to 09/25/2014 back to baseline and dropping below on 09/28/2014.  Patient has been sick with cold x 3 weeks and he is starting to feel better.  Encouraged her to have him call PCP if there is no improvement over the weekend.         Patient having stomach swelling and 6 lb weight gain.  Patient's regular Furosemide dosage is 40mg  - 2 tablets (80mg ) every morning.          Advised to take additional dosage of Lasix 40mg  - 1 tablet x 2 days (9/22 & 9/23) and then resume his regular prescribed Lasix dosage of 40mg  - 2 tablets (80mg ) every morning.  Repeat Optivol 10/07/2014.         Advised would forward the information to Dr [place] and Dr Rayann Heman for review.  I will call back with any recommendations.           Copy of note sent to patient's primary care physician, primary cardiologist, and device following  physician.  [name], RN, CCM 09/30/2014 8:54 AM   Would potentially have increased lasix x 3 days. Lets see how he does when you check him again.         Thanks!            ----- Message -----     From: [name], RN     Sent: 09/30/2014  9:13 AM      To: Thompson Grayer, MD

## 2014-10-07 ENCOUNTER — Ambulatory Visit (INDEPENDENT_AMBULATORY_CARE_PROVIDER_SITE_OTHER): Payer: Commercial Managed Care - HMO

## 2014-10-07 DIAGNOSIS — I5022 Chronic systolic (congestive) heart failure: Secondary | ICD-10-CM

## 2014-10-07 DIAGNOSIS — Z95810 Presence of automatic (implantable) cardiac defibrillator: Secondary | ICD-10-CM

## 2014-10-08 ENCOUNTER — Telehealth: Payer: Self-pay

## 2014-10-08 NOTE — Telephone Encounter (Signed)
ICM transmission received 10/07/2014.  Attempted call to patient, no answer and no answering machine.  Repeat ICM transmission showing Optivol impedance slightly below baseline.

## 2014-10-12 NOTE — Progress Notes (Signed)
EPIC Encounter for ICM Monitoring  Patient Name: Nathaniel Bolton is a 74 y.o. male Date: 10/12/2014 Primary Care Physican: Laverna Peace, NP Primary Cardiologist: [place] Electrophysiologist: Allred Dry Weight: 195 lbs (was 191 lb on 9/19 and 185 lbs on 8/17)       In the past month, have you:  1. Gained more than 2 pounds in a day or more than 5 pounds in a week? Yes, continues weight gain since 8/17, which was 185 lbs, and has gained 4 pounds in last week.   2. Had changes in your medications (with verification of current medications)? no  3. Had more shortness of breath than is usual for you? no  4. Limited your activity because of shortness of breath? no  5. Not been able to sleep because of shortness of breath? no  6. Had increased swelling in your feet or ankles? No, but has swelling in stomach area  7. Had symptoms of dehydration (dizziness, dry mouth, increased thirst, decreased urine output) no  8. Had changes in sodium restriction? no  9. Been compliant with medication? Yes   ICM trend: 10/12/2014   Follow-up plan: ICM clinic phone appointment on 10/26/2014.  Spoke with wife, verbal permission given by patient.  She reported she has given patient 1 extra fluid pill for 3 days, starting 10/10/2014 through today.  He is symptomatic and experiencing stomach swelling, weight gain and fatigue since last ICM call on 09/27/2014.  Repeat transmission today (last one was 10/08/2014) since he has been taking extra Furosemide since 10/10/2014.  Wife stated urine output remains the same even with the extra Lasix 40 mg x 3 days.  Today's Optivol transmission revealed thoracic impedance continues below baseline indicating possibility of fluid retention which started 08/25/2014 and continued for most of September and today's transmission.    Last BMET on 03/22/2014 - BUN = 33 and Creatinine 1.56        Appointment scheduled by Rollene Fare at [facility] with Tenny Craw, [place] on 10/14/2014 at 1:3PM since  patient is symptomatic for weight gain of 4 pounds in last week (10 pounds in last month), stomach swelling, fatigue and has normal        urine output after taking extra dosage of Lasix for 3 days this week and 2 days last week.        Wife notified of appointment and agreed. Recommended to take prescribed amount of Lasix and can discuss with Tenny Craw, PA on Thursday if additional doses are needed.  She verbalized understanding.             Copy of note sent to patient's primary care physician, primary cardiologist, and device following physician.  [name], RN, CCM 10/12/2014 12:21 PM

## 2014-10-13 NOTE — Telephone Encounter (Signed)
Spoke with patient on 10/12/2014

## 2014-10-14 ENCOUNTER — Ambulatory Visit (INDEPENDENT_AMBULATORY_CARE_PROVIDER_SITE_OTHER): Payer: Commercial Managed Care - HMO | Admitting: Physician Assistant

## 2014-10-14 ENCOUNTER — Encounter: Payer: Self-pay | Admitting: Physician Assistant

## 2014-10-14 VITALS — BP 114/66 | HR 70 | Ht 70.0 in | Wt 201.0 lb

## 2014-10-14 DIAGNOSIS — I10 Essential (primary) hypertension: Secondary | ICD-10-CM

## 2014-10-14 DIAGNOSIS — Z95810 Presence of automatic (implantable) cardiac defibrillator: Secondary | ICD-10-CM

## 2014-10-14 DIAGNOSIS — I255 Ischemic cardiomyopathy: Secondary | ICD-10-CM

## 2014-10-14 DIAGNOSIS — Z79899 Other long term (current) drug therapy: Secondary | ICD-10-CM | POA: Diagnosis not present

## 2014-10-14 DIAGNOSIS — I5022 Chronic systolic (congestive) heart failure: Secondary | ICD-10-CM

## 2014-10-14 DIAGNOSIS — I2583 Coronary atherosclerosis due to lipid rich plaque: Secondary | ICD-10-CM

## 2014-10-14 DIAGNOSIS — I2510 Atherosclerotic heart disease of native coronary artery without angina pectoris: Secondary | ICD-10-CM

## 2014-10-14 DIAGNOSIS — I472 Ventricular tachycardia, unspecified: Secondary | ICD-10-CM

## 2014-10-14 LAB — BASIC METABOLIC PANEL
BUN: 35 mg/dL — ABNORMAL HIGH (ref 7–25)
CO2: 27 mmol/L (ref 20–31)
Calcium: 8.8 mg/dL (ref 8.6–10.3)
Chloride: 99 mmol/L (ref 98–110)
Creat: 1.39 mg/dL — ABNORMAL HIGH (ref 0.70–1.18)
GLUCOSE: 223 mg/dL — AB (ref 65–99)
Potassium: 5.2 mmol/L (ref 3.5–5.3)
Sodium: 135 mmol/L (ref 135–146)

## 2014-10-14 NOTE — Patient Instructions (Signed)
TAKE AN ADDITIONAL FUROSEMIDE (LASIX) FOR 2 MORE DAYS, THEN TAKE THE EXTRA TABLET AS NEEDED FOR INCREASED SWELLING.  Your physician recommends that you return for lab work in: [place].  Your physician recommends that you schedule a follow-up appointment in: [place], [place].

## 2014-10-14 NOTE — Progress Notes (Signed)
Patient ID: Nathaniel Bolton, male   DOB: 1940/06/09, 74 y.o.   MRN: [id number]    Date:  10/14/2014   ID:  Nathaniel Bolton, DOB Nov 22, 1940, MRN [id number]  PCP:  Laverna Peace, NP  Primary Cardiologist:  Jordan/Taylor  Chief Complaint  Patient presents with  . Follow-up    CHF Sx.--weight gain, fatigue, fluid in abdomen (2 weeks ago)  . Edema    left leg/ankle/foot off and on     History of Present Illness: Nathaniel Bolton is a 74 y.o. male with an ICM, chronic systolic heart failure and VT. He was seen in the ED several weeks ago with VT at 175/min. He was initially below his rate detection cutoff and no therapy was delivered. The patient then accelerated into the VF zone and was treated with appropriate therapy.  His last echocardiogram was October 2012 and his ejection fraction was 123XX123, grade 1 diastolic dysfunction. Left atrium is mildly to moderately dilated.    Patient is here with concerns of weight gain and lower extremity edema. He says his weight has gone up from 185 pounds to 192. Lumbar scales he was 197 and August 30 is now 201.  He's been taking extra Lasix in the afternoon/evening.  He says his urine seems more concentrated and he doesn't seem to respond as quickly to the Lasix.  He denies shortness of breath orthopnea. He does get some shortness of breath of the bends over completely and also dizziness if he stands up very quickly.  The patient currently denies nausea, vomiting, fever, chest pain, cough, congestion, abdominal pain, hematochezia, melena, claudication.  Wt Readings from Last 3 Encounters:  10/14/14 91.173 kg (201 lb)  09/07/14 89.359 kg (197 lb)  04/20/14 94.167 kg (207 lb 9.6 oz)     Past Medical History  Diagnosis Date  . Myocardial infarction, old     x2 in [place]. S/P CABG approx 9 years ago per Dr. Ricard Dillon  . Diabetes mellitus     Type 2  . Hypertension   . CAD (coronary artery disease)   . Erectile dysfunction   . Gout   . Systolic CHF,  acute on chronic (HCC)     EF is 27% per myoview and echo October 2012  . ICD (implantable cardiac defibrillator) in place   . Hyperlipidemia   . Obstructive lung disease (HCC)     Moderate per prior PFT's  . CKD stage 3 due to type 2 diabetes mellitus [facility])     Current Outpatient Prescriptions  Medication Sig Dispense Refill  . allopurinol (ZYLOPRIM) 100 MG tablet Take 100 mg by mouth daily. And as needed    . Ascorbic Acid (VITAMIN C) 1000 MG tablet Take 1,000 mg by mouth daily.    Marland Kitchen aspirin 325 MG tablet Take 325 mg by mouth daily.    . carvedilol (COREG) 12.5 MG tablet Take 6.25 mg by mouth 2 (two) times daily.    . colchicine 0.6 MG tablet Take 0.6 mg by mouth daily. Pt. Taking PRN.    . furosemide (LASIX) 40 MG tablet Take 80 mg by mouth daily. Can take 1 EXTRA tablet by mouth daily as needed for swelling in his feet    . glipiZIDE (GLUCOTROL) 10 MG tablet Take 10 mg by mouth 2 (two) times daily before a meal.      . insulin aspart protamine-insulin aspart (NOVOLOG 70/30) (70-30) 100 UNIT/ML injection Inject 20 Units into the skin 2 (two) times daily with  a meal.     . lisinopril (PRINIVIL,ZESTRIL) 10 MG tablet Take 1 tablet (10 mg total) by mouth daily. 30 tablet 6  . NITROSTAT 0.4 MG SL tablet Place 0.4 mg under the tongue every 5 (five) minutes as needed for chest pain (MAX 3 TABLETS).     . NON FORMULARY Needles, syringes with needles, glucose reagent test strips    . Omega-3 Fatty Acids (FISH OIL PO) Take 2 tablets by mouth 2 (two) times daily.     Marland Kitchen omeprazole (PRILOSEC) 20 MG capsule Take 40 mg by mouth daily.     . Saw Palmetto, Serenoa repens, (SAW PALMETTO PO) Take 1 tablet by mouth daily.     . sertraline (ZOLOFT) 50 MG tablet Take 50 mg by mouth daily.     . simvastatin (ZOCOR) 80 MG tablet Take 80 mg by mouth at bedtime.      Marland Kitchen VITAMIN E PO Take 1 tablet by mouth daily.      No current facility-administered medications for this visit.    Allergies:    Allergies    Allergen Reactions  . Adhesive [Tape] Itching and Rash    Testosterone patch adhesive    Social History:  The patient  reports that he has quit smoking. He does not have any smokeless tobacco history on file. He reports that he does not drink alcohol or use illicit drugs.   Family history:   Family History  Problem Relation Age of Onset  . Heart failure Mother   . Heart attack Father   . Heart disease Sister     valve replaced  . Heart attack Brother     CABG, aortic grafting    ROS:  Please see the history of present illness.  All other systems reviewed and negative.   PHYSICAL EXAM: VS:  BP 114/66 mmHg  Pulse 70  Ht 5\' 10"  (1.778 m)  Wt 91.173 kg (201 lb)  BMI 28.84 kg/m2 Well nourished, well developed, in no acute distress HEENT: Pupils are equal round react to light accommodation extraocular movements are intact.  Neck: no JVD.  Positive hepatojugular refluxNo cervical lymphadenopathy. Cardiac: Regular rate and rhythm without murmurs rubs or gallops. Lungs:  Mild bilateral crackles no wheezing Abd: soft, nontender, positive bowel sounds all quadrants, no hepatosplenomegaly Ext: no lower extremity edema.  2+ radial and dorsalis pedis pulses. Skin: warm and dry Neuro:  Grossly normal   ASSESSMENT AND PLAN:  Problem List Items Addressed This Visit    Ventricular tachycardia ([place])   Ischemic cardiomyopathy   Hypertension   Chronic systolic heart failure (HCC)   CAD (coronary artery disease)   Automatic implantable cardioverter-defibrillator in situ    Other Visit Diagnoses    Medication management    -  Primary    Relevant Orders    Basic metabolic panel      Chronic systolic heart failure Patient does not appear to be grossly volume overloaded the edema in his lower extremities seems to have resolved. He denies any orthopnea.  I recommended he take the extra Lasix for the next 2 days and then continue once daily dosing. We'll check a basic metabolic panel  today.  Continue his daily weight monitoring and watching his sodium has been doing. I'll see him back in a month see how he is doing. Further plan pending bmet.  Continue lisinopril  Ischemic cardiomyopathy  Last echo was 2012 EF 25-30% AICD device  Essential hypertension: Blood pressures well-controlled. No changes in medications.  Coronary artery disease No complaints of angina.  Continue aspirin and beta blocker

## 2014-10-26 ENCOUNTER — Ambulatory Visit (INDEPENDENT_AMBULATORY_CARE_PROVIDER_SITE_OTHER): Payer: Commercial Managed Care - HMO | Admitting: *Deleted

## 2014-10-26 DIAGNOSIS — I5022 Chronic systolic (congestive) heart failure: Secondary | ICD-10-CM

## 2014-10-26 DIAGNOSIS — Z95810 Presence of automatic (implantable) cardiac defibrillator: Secondary | ICD-10-CM

## 2014-10-26 NOTE — Progress Notes (Addendum)
EPIC Encounter for ICM Monitoring  Patient Name: Nathaniel Bolton is a 74 y.o. male Date: 10/26/2014 Primary Care Physican: Laverna Peace, NP Primary Cardiologist: [place] Electrophysiologist: Allred Dry Weight: 192 lbs       In the past month, have you:  1. Gained more than 2 pounds in a day or more than 5 pounds in a week? no  2. Had changes in your medications (with verification of current medications)? no  3. Had more shortness of breath than is usual for you? no  4. Limited your activity because of shortness of breath? no  5. Not been able to sleep because of shortness of breath? no  6. Had increased swelling in your feet or ankles? no  7. Had symptoms of dehydration (dizziness, dry mouth, increased thirst, decreased urine output) no  8. Had changes in sodium restriction? no  9. Been compliant with medication? Yes   ICM trend:   Follow-up plan: ICM clinic phone appointment in 12/07/2014.  Optivol impedance trending closer to baseline since last transmission.  Patient followed up with Tenny Craw, PA on 10/14/2014.  He has decreased from 201 lbs to 192 lbs.  He denied any HF symptoms at this time.  No changes today and advised to call office for any HF symptoms.    Copy of note sent to patient's primary care physician, primary cardiologist, and device following physician.  [name], RN, CCM 10/26/2014 2:43 PM

## 2014-11-19 ENCOUNTER — Encounter: Payer: Self-pay | Admitting: Cardiology

## 2014-11-22 ENCOUNTER — Encounter: Payer: Self-pay | Admitting: Physician Assistant

## 2014-11-22 ENCOUNTER — Ambulatory Visit (INDEPENDENT_AMBULATORY_CARE_PROVIDER_SITE_OTHER): Payer: Commercial Managed Care - HMO | Admitting: Physician Assistant

## 2014-11-22 VITALS — BP 124/60 | HR 61 | Ht 70.0 in | Wt 201.0 lb

## 2014-11-22 DIAGNOSIS — I5022 Chronic systolic (congestive) heart failure: Secondary | ICD-10-CM | POA: Diagnosis not present

## 2014-11-22 DIAGNOSIS — I10 Essential (primary) hypertension: Secondary | ICD-10-CM

## 2014-11-22 DIAGNOSIS — I472 Ventricular tachycardia, unspecified: Secondary | ICD-10-CM

## 2014-11-22 DIAGNOSIS — I2510 Atherosclerotic heart disease of native coronary artery without angina pectoris: Secondary | ICD-10-CM | POA: Diagnosis not present

## 2014-11-22 DIAGNOSIS — Z95810 Presence of automatic (implantable) cardiac defibrillator: Secondary | ICD-10-CM | POA: Diagnosis not present

## 2014-11-22 DIAGNOSIS — I255 Ischemic cardiomyopathy: Secondary | ICD-10-CM

## 2014-11-22 DIAGNOSIS — I2583 Coronary atherosclerosis due to lipid rich plaque: Principal | ICD-10-CM

## 2014-11-22 LAB — BASIC METABOLIC PANEL
BUN: 44 mg/dL — ABNORMAL HIGH (ref 7–25)
CHLORIDE: 99 mmol/L (ref 98–110)
CO2: 27 mmol/L (ref 20–31)
Calcium: 9 mg/dL (ref 8.6–10.3)
Creat: 1.62 mg/dL — ABNORMAL HIGH (ref 0.70–1.18)
GLUCOSE: 325 mg/dL — AB (ref 65–99)
Potassium: 4.7 mmol/L (ref 3.5–5.3)
Sodium: 137 mmol/L (ref 135–146)

## 2014-11-22 NOTE — Progress Notes (Signed)
Patient ID: Nathaniel Bolton, male   DOB: [birth year], 74 y.o.   MRN: [id number]    Date:  11/22/2014   ID:  Nathaniel Bolton, DOB 1940-09-10, MRN [id number]  PCP:  Laverna Peace, NP  Primary Cardiologist:  Jordan/Taylor   Chief Complaint  Patient presents with  . Follow-up     History of Present Illness: Nathaniel Bolton is a 74 y.o. male with an ICD, chronic systolic heart failure and VT. He was seen in the ED several weeks ago with VT at 175/min. He was initially below his rate detection cutoff and no therapy was delivered. The patient then accelerated into the VF zone and was treated with appropriate therapy. His last echocardiogram was October 2012 and his ejection fraction was 123XX123, grade 1 diastolic dysfunction. Left atrium is mildly to moderately dilated.  I saw the patient back in early October with concerns of weight gain and lower extremity edema. At that time he said his weight had gone up from 185 pounds to 192.  Today his weight is 201 pounds which is exactly what he was one month ago. He denies shortness of breath, orthopnea, lower extremity edema, PND. He took an extra Lasix 3 times last week. He otherwise denies nausea, vomiting, fever, chest pain, dizziness, cough, congestion, abdominal pain, hematochezia, melena, claudication.  Wt Readings from Last 3 Encounters:  11/22/14 201 lb (91.173 kg)  10/14/14 201 lb (91.173 kg)  09/07/14 197 lb (89.359 kg)     Past Medical History  Diagnosis Date  . Myocardial infarction, old     x2 in [place]. S/P CABG approx 9 years ago per Dr. Ricard Dillon  . Diabetes mellitus     Type 2  . Hypertension   . CAD (coronary artery disease)   . Erectile dysfunction   . Gout   . Systolic CHF, acute on chronic (HCC)     EF is 27% per myoview and echo October 2012  . ICD (implantable cardiac defibrillator) in place   . Hyperlipidemia   . Obstructive lung disease (HCC)     Moderate per prior PFT's  . CKD stage 3 due to type 2  diabetes mellitus [facility])     Current Outpatient Prescriptions  Medication Sig Dispense Refill  . allopurinol (ZYLOPRIM) 300 MG tablet Take 1 tablet by mouth daily.    . Ascorbic Acid (VITAMIN C) 1000 MG tablet Take 1,000 mg by mouth daily.    Marland Kitchen aspirin 325 MG tablet Take 325 mg by mouth daily.    . carvedilol (COREG) 12.5 MG tablet Take 6.25 mg by mouth 2 (two) times daily.    . colchicine 0.6 MG tablet Take 0.6 mg by mouth daily. Pt. Taking PRN.    . furosemide (LASIX) 40 MG tablet Take 80 mg by mouth daily. Can take 1 EXTRA tablet by mouth daily as needed for swelling in his feet    . glipiZIDE (GLUCOTROL) 10 MG tablet Take 10 mg by mouth 2 (two) times daily before a meal.      . insulin aspart protamine-insulin aspart (NOVOLOG 70/30) (70-30) 100 UNIT/ML injection Inject 20 Units into the skin 2 (two) times daily with a meal.     . lisinopril (PRINIVIL,ZESTRIL) 10 MG tablet Take 1 tablet (10 mg total) by mouth daily. 30 tablet 6  . NITROSTAT 0.4 MG SL tablet Place 0.4 mg under the tongue every 5 (five) minutes as needed for chest pain (MAX 3 TABLETS).     . NON FORMULARY  Needles, syringes with needles, glucose reagent test strips    . Omega-3 Fatty Acids (FISH OIL PO) Take 2 tablets by mouth 2 (two) times daily.     Marland Kitchen omeprazole (PRILOSEC) 20 MG capsule Take 40 mg by mouth daily.     . Saw Palmetto, Serenoa repens, (SAW PALMETTO PO) Take 1 tablet by mouth daily.     . sertraline (ZOLOFT) 50 MG tablet Take 50 mg by mouth daily.     . simvastatin (ZOCOR) 80 MG tablet Take 80 mg by mouth at bedtime.      Marland Kitchen VITAMIN E PO Take 1 tablet by mouth daily.      No current facility-administered medications for this visit.    Allergies:    Allergies  Allergen Reactions  . Adhesive [Tape] Itching and Rash    Testosterone patch adhesive    Social History:  The patient  reports that he has quit smoking. He does not have any smokeless tobacco history on file. He reports that he does not drink  alcohol or use illicit drugs.   Family history:   Family History  Problem Relation Age of Onset  . Heart failure Mother   . Heart attack Father   . Heart disease Sister     valve replaced  . Heart attack Brother     CABG, aortic grafting    ROS:  Please see the history of present illness.  All other systems reviewed and negative.   PHYSICAL EXAM: VS:  BP 124/60 mmHg  Pulse 61  Ht 5\' 10"  (1.778 m)  Wt 201 lb (91.173 kg)  BMI 28.84 kg/m2 Overweight, well developed, in no acute distress HEENT: Pupils are equal round react to light accommodation extraocular movements are intact.  Neck: no JVDNo cervical lymphadenopathy. Cardiac: Regular rate and rhythm without murmurs rubs or gallops. Lungs:  clear to auscultation bilaterally, no wheezing, rhonchi or rales Abd: soft, nontender, positive bowel sounds all quadrants,  Ext: Trace lower extremity edema.  2+ radial and dorsalis pedis pulses. Skin: warm and dry Neuro:  Grossly normal  EKG:  Sinus rhythm rate she still beats per minute inferior lateral T-wave inversions which are not new.   ASSESSMENT AND PLAN:  Problem List Items Addressed This Visit    Ventricular tachycardia ([place])   Ischemic cardiomyopathy   Hypertension   Chronic systolic heart failure (HCC)   CAD (coronary artery disease) - Primary   Automatic implantable cardioverter-defibrillator in situ     Chronic systolic heart failure: Patient appears euvolemic and seems to be managing his heart failure well. Continue Lasix 80 mg daily with extra when necessary.  Continue daily weight monitoring low-sodium diet.  Check basic metabolic panel today  Ischemic cardiomyopathy  Last 2-D echocardiogram was October 2012 EF was 25-30%.  No reason to recheck it today.  AICD followed by Dr. Lovena Le  Essential hypertension Blood pressure well controlled continue current medications.  Coronary artery disease No complaints of angina. On full dose aspirin, beta blocker and  statin.  Overweight: We discussed eating a lower carb diet.  Follow-up in 6 months with Dr. Martinique or sooner if needed.

## 2014-11-22 NOTE — Patient Instructions (Addendum)
Your physician wants you to follow-up in: 6 Months with Dr Martinique You will receive a reminder letter in the mail two months in advance. If you don't receive a letter, please call our office to schedule the follow-up appointment.  Your physician recommends that you return for lab work in: Today  BMP

## 2014-12-07 ENCOUNTER — Ambulatory Visit (INDEPENDENT_AMBULATORY_CARE_PROVIDER_SITE_OTHER): Payer: Commercial Managed Care - HMO | Admitting: *Deleted

## 2014-12-07 ENCOUNTER — Telehealth: Payer: Self-pay | Admitting: Cardiology

## 2014-12-07 DIAGNOSIS — I5022 Chronic systolic (congestive) heart failure: Secondary | ICD-10-CM

## 2014-12-07 DIAGNOSIS — Z95810 Presence of automatic (implantable) cardiac defibrillator: Secondary | ICD-10-CM

## 2014-12-07 DIAGNOSIS — I255 Ischemic cardiomyopathy: Secondary | ICD-10-CM

## 2014-12-07 NOTE — Telephone Encounter (Signed)
Confirmed remote transmission w/ pt wife.   

## 2014-12-09 NOTE — Progress Notes (Addendum)
EPIC Encounter for ICM Monitoring  Patient Name: Nathaniel Bolton is a 74 y.o. male Date: 12/09/2014 Primary Care Physican: Laverna Peace, NP Primary Cardiologist: [place] Electrophysiologist: Allred Dry Weight: 196 lb       In the past month, have you:  1. Gained more than 2 pounds in a day or more than 5 pounds in a week? Yes, in the last week, gain of 4-5 lbs  2. Had changes in your medications (with verification of current medications)? no  3. Had more shortness of breath than is usual for you? no  4. Limited your activity because of shortness of breath? no  5. Not been able to sleep because of shortness of breath? no  6. Had increased swelling in your feet or ankles? no  7. Had symptoms of dehydration (dizziness, dry mouth, increased thirst, decreased urine output) no  8. Had changes in sodium restriction? no  9. Been compliant with medication? Yes   ICM trend: 12/08/2014    Follow-up plan: ICM clinic phone appointment on 01/13/2015.  Spoke with wife.  Optivol thoracic impedance below baseline 11/15/2014 to 12/07/2014 suggesting fluid retention.  She reported his symptoms were weight gain, stomach swelling and SOB.  She stated he has taken extra Furosemide with physician approval for about 2 days this week and 2-3 days last week and symptoms resolved.  Advised to notify physician if he has to take extra Furosemide dosage on routine basis such as 4 to 5 times every week so his labs can be monitored closely.  No changes today.    Last lab results on 11/22/2014 showed Creatinine 1.62 and BUN 44.  Copy of note sent to patient's primary care physician, primary cardiologist, and device following physician.  [name], RN, CCM 12/09/2014 3:12 PM   Thompson Grayer, MD   Sent: Thu December 09, 2014 4:36 PM    To: [name], RN        Message     Repeat in a week or two?   Will repeat ICM transmission on 12/23/2014 for follow up.

## 2014-12-09 NOTE — Progress Notes (Signed)
Remote ICD transmission.   

## 2014-12-16 LAB — CUP PACEART REMOTE DEVICE CHECK
Battery Voltage: 2.88 V
Brady Statistic RV Percent Paced: 0.19 %
Date Time Interrogation Session: 20161130143437
HIGH POWER IMPEDANCE MEASURED VALUE: 43 Ohm
HighPow Impedance: 58 Ohm
Implantable Lead Implant Date: 20091217
Implantable Lead Model: 6947
Lead Channel Impedance Value: 342 Ohm
Lead Channel Pacing Threshold Pulse Width: 0.4 ms
Lead Channel Sensing Intrinsic Amplitude: 5.125 mV
Lead Channel Sensing Intrinsic Amplitude: 5.125 mV
Lead Channel Setting Pacing Amplitude: 2.5 V
Lead Channel Setting Pacing Pulse Width: 0.4 ms
Lead Channel Setting Sensing Sensitivity: 0.3 mV
MDC IDC LEAD LOCATION: 753860
MDC IDC MSMT LEADCHNL RV PACING THRESHOLD AMPLITUDE: 1 V

## 2014-12-17 ENCOUNTER — Encounter: Payer: Self-pay | Admitting: Cardiology

## 2014-12-23 ENCOUNTER — Ambulatory Visit (INDEPENDENT_AMBULATORY_CARE_PROVIDER_SITE_OTHER): Payer: Commercial Managed Care - HMO

## 2014-12-23 ENCOUNTER — Telehealth: Payer: Self-pay | Admitting: Cardiology

## 2014-12-23 DIAGNOSIS — I5022 Chronic systolic (congestive) heart failure: Secondary | ICD-10-CM | POA: Diagnosis not present

## 2014-12-23 DIAGNOSIS — Z95810 Presence of automatic (implantable) cardiac defibrillator: Secondary | ICD-10-CM | POA: Diagnosis not present

## 2014-12-23 NOTE — Progress Notes (Addendum)
EPIC Encounter for ICM Monitoring  Patient Name: Nathaniel Bolton is a 74 y.o. male Date: 12/23/2014 Primary Care Physican: Laverna Peace, NP Primary Cardiologist: [place] Electrophysiologist: Allred Dry Weight: 195 lb       In the past month, have you:  1. Gained more than 2 pounds in a day or more than 5 pounds in a week? no  2. Had changes in your medications (with verification of current medications)? no  3. Had more shortness of breath than is usual for you? no  4. Limited your activity because of shortness of breath? no  5. Not been able to sleep because of shortness of breath? no  6. Had increased swelling in your feet or ankles? Yes, ankles & stomach  7. Had symptoms of dehydration (dizziness, dry mouth, increased thirst, decreased urine output) no  8. Had changes in sodium restriction? no  9. Been compliant with medication? Yes   ICM trend: 12/23/2014   Follow-up plan: ICM clinic phone appointment on 01/06/2015.  Spoke with wife.  Optivol thoracic impedance continues to be below baseline since 11/18/2014 suggesting fluid retention.  She stated he is having stomach swelling and ankle swelling.  She stated the physician has told her that he can take extra 40 mg Lasix for a couple of days and she will start this evening.  She stated she understands that Lasix can effect the kidneys and she is aware to be cautious with how much he should take.  Advised if symptoms do not improve to call back and she stated she would do so.      Advised would send for Dr Martinique and Dr Rayann Heman to review and call back with any recommendations.   Repeat transmission 01/06/2015.  Last lab results on 11/22/2014 showed Creatinine 1.62 and BUN 44  Copy of note sent to patient's primary care physician, primary cardiologist, and device following physician.  [name], RN, CCM 12/23/2014 3:36 PM   Thompson Grayer, MD   Sent: Thu December 23, 2014 4:21 PM    To: [name], RN         Message     Agree        ----- Message -----     From: [name], RN     Sent: 12/23/2014  3:56 PM      To: Thompson Grayer, MD

## 2014-12-23 NOTE — Telephone Encounter (Signed)
Confirmed remote transmission w/ pt wife.   

## 2015-01-06 ENCOUNTER — Ambulatory Visit (INDEPENDENT_AMBULATORY_CARE_PROVIDER_SITE_OTHER): Payer: Commercial Managed Care - HMO

## 2015-01-06 DIAGNOSIS — I5022 Chronic systolic (congestive) heart failure: Secondary | ICD-10-CM

## 2015-01-06 DIAGNOSIS — Z95810 Presence of automatic (implantable) cardiac defibrillator: Secondary | ICD-10-CM

## 2015-01-07 NOTE — Progress Notes (Signed)
EPIC Encounter for ICM Monitoring  Patient Name: Nathaniel Bolton is a 74 y.o. male Date: 01/07/2015 Primary Care Physican: Laverna Peace, NP Primary Cardiologist: [place] Electrophysiologist: Allred Dry Weight: 196 lb       In the past month, have you:  1. Gained more than 2 pounds in a day or more than 5 pounds in a week? no  2. Had changes in your medications (with verification of current medications)? no  3. Had more shortness of breath than is usual for you? no  4. Limited your activity because of shortness of breath? no  5. Not been able to sleep because of shortness of breath? no  6. Had increased swelling in your feet or ankles? no  7. Had symptoms of dehydration (dizziness, dry mouth, increased thirst, decreased urine output) no  8. Had changes in sodium restriction? no  9. Been compliant with medication? Yes   ICM trend: 01/06/2015 - 1 year view   ICM Trend: 3 month view   Follow-up plan: ICM clinic phone appointment 02/08/2015.  Spoke with wife.  Repeat Optivol to recheck fluid levels.  Optivol thoracic impedance trending along baseline after patient took 2 days of extra Furosemide on 12/24/2014.  She stated he is feeling well and denied any HF symptoms.  Encouraged her to call should he have any fluid symptoms. Reminded her to have patient follow low sodium diet to decrease risk of fluid retention.  No changes today.    Copy of note sent to patient's primary care physician, primary cardiologist, and device following physician.  [name], RN, CCM 01/07/2015 12:26 PM

## 2015-02-08 ENCOUNTER — Telehealth: Payer: Self-pay

## 2015-02-08 NOTE — Telephone Encounter (Signed)
Remote ICM transmission received.  Attempted call to wife, [name] and left message for return call.

## 2015-02-18 NOTE — Telephone Encounter (Signed)
Unable to reach patient for remote monthly ICM follow up.  Next remote ICM transmission scheduled for 03/09/2015 and patient letter sent with new date and to call if experiencing any symptoms.    Optivol thoracic impedance below reference line from 01/24/2015 to 02/01/2015 suggesting fluid retention.  Impedance returned to reference line 02/04/2015.

## 2015-02-21 ENCOUNTER — Ambulatory Visit (INDEPENDENT_AMBULATORY_CARE_PROVIDER_SITE_OTHER): Payer: Commercial Managed Care - HMO

## 2015-02-21 DIAGNOSIS — Z95810 Presence of automatic (implantable) cardiac defibrillator: Secondary | ICD-10-CM

## 2015-02-21 DIAGNOSIS — I5022 Chronic systolic (congestive) heart failure: Secondary | ICD-10-CM

## 2015-02-21 NOTE — Progress Notes (Signed)
EPIC Encounter for ICM Monitoring  Patient Name: Nathaniel Bolton is a 75 y.o. male Date: 02/21/2015 Primary Care Physican: Laverna Peace, NP Primary Cardiologist: [place] Electrophysiologist: Allred Dry Weight: 195 lbs       In the past month, have you:  1. Gained more than 2 pounds in a day or more than 5 pounds in a week? no  2. Had changes in your medications (with verification of current medications)? no  3. Had more shortness of breath than is usual for you? no  4. Limited your activity because of shortness of breath? no  5. Not been able to sleep because of shortness of breath? no  6. Had increased swelling in your feet or ankles? no  7. Had symptoms of dehydration (dizziness, dry mouth, increased thirst, decreased urine output) no  8. Had changes in sodium restriction? no  9. Been compliant with medication? Yes   ICM trend: 3 month view for 02/21/2015   ICM trend: 1 year view for 02/21/2015   Follow-up plan: ICM clinic phone appointment 03/09/2015.  Spoke with wife, she called to inform patient's BP dropped to 94/52 on 02/19/2015 while he was bending over to pick up something and he felt faint but did not pass out.  He felt better after sitting.   BP: 2/11 - 94/52 2/12 - 106/54  2/13 was 139/64 He has not had any further episodes of feeling faint and has not had any dizziness.  Confirmed he is taking meds as prescribed and not taking any new meds.     Optivol today showed no episodes but thoracic impedance below reference line since 02/13/2015 suggesting fluid.  He denied any fluid symptoms at this time.    Advised would send to Dr Martinique and Dr Rayann Heman for review and recommendations regarding low BP with feeling faint and possible fluid but asymptomatic for fluid retention.  Advised will call back with any recommendations.   Copy of note sent to patient's primary care physician, primary cardiologist, and device following physician.  [name], RN, CCM 02/21/2015 9:07  AM

## 2015-02-22 NOTE — Progress Notes (Signed)
Call to patient's wife. Advised Dr Martinique would like for patient to continue current medication and dosages.  She verbalized understanding.  She stated he has not had any further episodes of feeling faint.  She stated his feet are a little swollen today and she will give him an extra fluid pill today and tomorrow if needed to resolve the swelling and advised to call if the extra Furosemide does not resolve the swelling.  Repeat transmission on 03/09/2015.

## 2015-02-22 NOTE — Progress Notes (Signed)
[name], LPN  [name], RN            [name] Dr.Jordan reviewed pt's B/P readings.No change continue same medications.

## 2015-03-01 NOTE — Progress Notes (Signed)
Cardiology Office Note Date:  03/02/2015  Patient ID:  Nathaniel Bolton, Nathaniel Bolton 03/30/1940, MRN [id number] PCP:  Laverna Peace, NP  Cardiologist:  Dr. Martinique Electrophysiologist: Dr. Rayann Heman   Chief Complaint: planned 6 month f/u visit  History of Present Illness: Nathaniel Bolton is a 75 y.o. male with history of CAD/CABG, ICM with ICD, VT, DM, HTN, HLD, CRI, COPD, and CVA he states about 3 years ago last seen by cardiology service [name] Nov 2016, at that time reported using some PRN lasix for weight gain and swelling though doing well.    He comes to the office today for a planned 6 month visit, last seen by Dr. Lovena Le in August after receiving ICD therapy for VT, his device had been reprogrammed.  He is being seen today for Dr. Rayann Heman, feeling pretty well.  He denies any CP, palpitations, no dizziness, near syncope or syncope, no further shocks from his device since August.  He has some baseline SOB/DOE he attributes to  his COPD unchanged for a few years, and feels that is at his baseline.  His wife mentions that she will give him extra lasix about 2x week for ankle swelling.  The patient denies symptoms of PND or orthopnea.  He takes his lasix 40mg  BID and 2x week on average is taking 80mg  BID.    VT history: 09/01/14:   VT at 175/min. He was initially below his rate detection cutoff and no therapy was delivered. The patient then accelerated into the VF zone and was treated with appropriate therapy.ATP did not terminate the arrhythmia and a single ICD shock restored NSR. Device was re-programmed to include a VT zone at 170/min and include 4 burst and 4 ramp pacing attempts before delivering a shock.    Past Medical History  Diagnosis Date  . Myocardial infarction, old     x2 in [place]. S/P CABG approx 9 years ago per Dr. Ricard Dillon  . Diabetes mellitus     Type 2  . Hypertension   . CAD (coronary artery disease)   . Erectile dysfunction   . Gout   . Systolic CHF, acute on  chronic (HCC)     EF is 27% per myoview and echo October 2012  . ICD (implantable cardiac defibrillator) in place   . Hyperlipidemia   . Obstructive lung disease (HCC)     Moderate per prior PFT's  . CKD stage 3 due to type 2 diabetes mellitus ([place])   . Stroke [facility])     2013    Past Surgical History  Procedure Laterality Date  . Bypass graft      x5  . Cardiac defibrillator placement  2008    by Dr Elonda Husky at [facility]  . Cholecystectomy    . Knee arthroscopy      right  . Umbilical hernia repair    . Cataract surgery      Current Outpatient Prescriptions  Medication Sig Dispense Refill  . allopurinol (ZYLOPRIM) 300 MG tablet Take 1 tablet by mouth daily.    . Ascorbic Acid (VITAMIN C) 1000 MG tablet Take 1,000 mg by mouth daily.    Marland Kitchen aspirin 325 MG tablet Take 325 mg by mouth daily.    . carvedilol (COREG) 12.5 MG tablet Take 6.25 mg by mouth 2 (two) times daily.    . colchicine 0.6 MG tablet Take 0.6 mg by mouth daily. Pt. Taking PRN.    . furosemide (LASIX) 40 MG tablet Take  80 mg by mouth daily. Can take 1 EXTRA tablet by mouth daily as needed for swelling in his feet    . glipiZIDE (GLUCOTROL) 10 MG tablet Take 10 mg by mouth 2 (two) times daily before a meal.      . insulin aspart protamine-insulin aspart (NOVOLOG 70/30) (70-30) 100 UNIT/ML injection Inject 20 Units into the skin 2 (two) times daily with a meal.     . lisinopril (PRINIVIL,ZESTRIL) 10 MG tablet Take 1 tablet (10 mg total) by mouth daily. 30 tablet 6  . NITROSTAT 0.4 MG SL tablet Place 0.4 mg under the tongue every 5 (five) minutes as needed for chest pain (MAX 3 TABLETS).     . NON FORMULARY Needles, syringes with needles, glucose reagent test strips    . Omega-3 Fatty Acids (FISH OIL PO) Take 2 tablets by mouth 2 (two) times daily.     Marland Kitchen omeprazole (PRILOSEC) 20 MG capsule Take 40 mg by mouth daily.     . Saw Palmetto, Serenoa repens, (SAW PALMETTO PO) Take 1 tablet by mouth daily.     .  sertraline (ZOLOFT) 50 MG tablet Take 50 mg by mouth daily.     . simvastatin (ZOCOR) 80 MG tablet Take 80 mg by mouth at bedtime.      Marland Kitchen VITAMIN E PO Take 1 tablet by mouth daily.      No current facility-administered medications for this visit.    Allergies:   Adhesive   Social History:  The patient  reports that he has quit smoking. He does not have any smokeless tobacco history on file. He reports that he does not drink alcohol or use illicit drugs.   Family History:  The patient's family history includes Heart attack in his brother and father; Heart disease in his sister; Heart failure in his mother.  ROS:  Please see the history of present illness.   All other systems are reviewed and otherwise negative.   PHYSICAL EXAM:  VS:  BP 120/62 mmHg  Pulse 64  Ht 5\' 10"  (1.778 m)  Wt 201 lb (91.173 kg)  BMI 28.84 kg/m2 BMI: Body mass index is 28.84 kg/(m^2). Well nourished, well developed, in no acute distress HEENT: normocephalic, atraumatic Neck: no JVD, carotid bruits or masses Cardiac:  normal S1, S2; RRR; no significant murmurs, no rubs, or gallops Lungs:  clear to auscultation bilaterally, no wheezing, rhonchi or rales Abd: soft, nontender MS: no deformity or atrophy Ext: trace edema Skin: warm and dry, no rash Neuro:  No gross deficits appreciated Psych: euthymic mood, full affect  ICD site is stable, no tethering or discomfort   EKG:  Done today shows SR, APCs, IVCD ICD interrogation today: battery status is OK, normal device function, there are 4 episodes in VT monitor zone that appear irregular ?AF  11/02/15: Echocardiogram Study Conclusions  Left ventricle: Diffuse hypokinesis with inferior wall akinesis The cavity size was moderately dilated. Wall thickness was normal. Systolic function was severely reduced. The estimated ejection fraction was in the range of 25% to 30%. Doppler parameters are consistent with abnormal left ventricular relaxation  (grade 1 diastolic dysfunction). - Left atrium: The atrium was mildly to moderately dilated. - Atrial septum: No defect or patent foramen ovale was identified.  Recent Labs: 03/22/2014: Pro B Natriuretic peptide (BNP) 79.0 11/22/2014: BUN 44*; Creat 1.62*; Potassium 4.7; Sodium 137  No results found for requested labs within last 365 days.   CrCl cannot be calculated (Patient has no serum creatinine  result on file.).   Wt Readings from Last 3 Encounters:  03/02/15 201 lb (91.173 kg)  11/22/14 201 lb (91.173 kg)  10/14/14 201 lb (91.173 kg)     Other studies reviewed: Additional studies/records reviewed today include: summarized above  DEVICE information: MDT single chamber ICD implanted 12/25/07, Dr. Rayann Heman  ASSESSMENT AND PLAN:  1. ICM/ICD/VT     On BB/ACE, diuretic tx     no recurrent VT     appears compensated     continue q 82mo [facility] and ICM clinic     Using 2x week additional lasix,this regime appears to keep him compensated, his weight is stable his exam appears compensated today  2. CAD     On ASA, statin, BB     no angina  3. HTN     Appears well controlled  4. ? New PAF     No symptoms     Will f/u further  Disposition: F/u with 30 day event monitor and 6 week f/u to re-evaluate possible PAFib, his CHA2DS2Vasc score is at least 8 and if he has true PAF will need to get him on a/c, we discussed this today they are agreeable to the plan.  We will get a BMET today given routine use of extra lasix.  Current medicines are reviewed at length with the patient today.  The patient did not have any concerns regarding medicines.  Haywood Lasso, PA-C 03/02/2015 8:52 AM     [facility] [place] [address] [phone] (office)  [phone] (fax)

## 2015-03-02 ENCOUNTER — Encounter: Payer: Self-pay | Admitting: Physician Assistant

## 2015-03-02 ENCOUNTER — Ambulatory Visit (INDEPENDENT_AMBULATORY_CARE_PROVIDER_SITE_OTHER): Payer: Commercial Managed Care - HMO | Admitting: Physician Assistant

## 2015-03-02 ENCOUNTER — Ambulatory Visit (INDEPENDENT_AMBULATORY_CARE_PROVIDER_SITE_OTHER): Payer: Commercial Managed Care - HMO

## 2015-03-02 ENCOUNTER — Other Ambulatory Visit: Payer: Self-pay | Admitting: Physician Assistant

## 2015-03-02 VITALS — BP 120/62 | HR 64 | Ht 70.0 in | Wt 201.0 lb

## 2015-03-02 DIAGNOSIS — I472 Ventricular tachycardia, unspecified: Secondary | ICD-10-CM

## 2015-03-02 DIAGNOSIS — I4891 Unspecified atrial fibrillation: Secondary | ICD-10-CM | POA: Diagnosis not present

## 2015-03-02 DIAGNOSIS — I10 Essential (primary) hypertension: Secondary | ICD-10-CM | POA: Diagnosis not present

## 2015-03-02 DIAGNOSIS — N189 Chronic kidney disease, unspecified: Secondary | ICD-10-CM | POA: Diagnosis not present

## 2015-03-02 DIAGNOSIS — I255 Ischemic cardiomyopathy: Secondary | ICD-10-CM

## 2015-03-02 DIAGNOSIS — I2510 Atherosclerotic heart disease of native coronary artery without angina pectoris: Secondary | ICD-10-CM

## 2015-03-02 LAB — BASIC METABOLIC PANEL
BUN: 34 mg/dL — ABNORMAL HIGH (ref 7–25)
CALCIUM: 8.9 mg/dL (ref 8.6–10.3)
CHLORIDE: 98 mmol/L (ref 98–110)
CO2: 26 mmol/L (ref 20–31)
Creat: 1.58 mg/dL — ABNORMAL HIGH (ref 0.70–1.18)
GLUCOSE: 297 mg/dL — AB (ref 65–99)
Potassium: 4.5 mmol/L (ref 3.5–5.3)
SODIUM: 135 mmol/L (ref 135–146)

## 2015-03-02 NOTE — Progress Notes (Signed)
Spoke with patient during office visit with Tommye Standard, NP today.  Rescheduled to 04/04/2015 due to transmission was completed during office visit.   Letter sent with new date.

## 2015-03-02 NOTE — Patient Instructions (Addendum)
Medication Instructions:   Your physician recommends that you continue on your current medications as directed. Please refer to the Current Medication list given to you today.   If you need a refill on your cardiac medications before your next appointment, please call your pharmacy.  Labwork;  BMET      Testing/Procedures:  Your physician has recommended that you wear an event monitor. Event monitors are medical devices that record the heart's electrical activity. Doctors most often [place] these monitors to diagnose arrhythmias. Arrhythmias are problems with the speed or rhythm of the heartbeat. The monitor is a small, portable device. You can wear one while you do your normal daily activities. This is usually used to diagnose what is causing palpitations/syncope (passing out).   Follow-Up:  6 - 8 WEEKS WITH DR ALLRED OR WITH RENEE ON A DAY DR Rayann Heman IN THE OFFICE   Any Other Special Instructions Will Be Listed Below (If Applicable).

## 2015-03-03 ENCOUNTER — Telehealth: Payer: Self-pay | Admitting: *Deleted

## 2015-03-03 NOTE — Telephone Encounter (Signed)
-----   Message from [facility], [place] sent at 03/02/2015  4:20 PM EST ----- Please let the patient know his lab, renal function, look stable, no changes to his current medicines.  Thanks [employer]

## 2015-03-07 ENCOUNTER — Encounter: Payer: Self-pay | Admitting: Physician Assistant

## 2015-03-09 DIAGNOSIS — I480 Paroxysmal atrial fibrillation: Secondary | ICD-10-CM

## 2015-03-09 HISTORY — DX: Paroxysmal atrial fibrillation: I48.0

## 2015-03-15 ENCOUNTER — Ambulatory Visit (INDEPENDENT_AMBULATORY_CARE_PROVIDER_SITE_OTHER): Payer: Commercial Managed Care - HMO | Admitting: Cardiology

## 2015-03-15 ENCOUNTER — Encounter: Payer: Self-pay | Admitting: Cardiology

## 2015-03-15 VITALS — BP 120/62 | HR 70 | Ht 71.0 in | Wt 202.2 lb

## 2015-03-15 DIAGNOSIS — I10 Essential (primary) hypertension: Secondary | ICD-10-CM | POA: Diagnosis not present

## 2015-03-15 DIAGNOSIS — I5022 Chronic systolic (congestive) heart failure: Secondary | ICD-10-CM | POA: Diagnosis not present

## 2015-03-15 DIAGNOSIS — Z95810 Presence of automatic (implantable) cardiac defibrillator: Secondary | ICD-10-CM

## 2015-03-15 DIAGNOSIS — I472 Ventricular tachycardia, unspecified: Secondary | ICD-10-CM

## 2015-03-15 DIAGNOSIS — I2583 Coronary atherosclerosis due to lipid rich plaque: Secondary | ICD-10-CM

## 2015-03-15 DIAGNOSIS — I2510 Atherosclerotic heart disease of native coronary artery without angina pectoris: Secondary | ICD-10-CM

## 2015-03-15 NOTE — Patient Instructions (Signed)
Continue your current therapy  Take extra lasix as needed.  Restrict your salt intake.  I will see you in 6 months.

## 2015-03-15 NOTE — Progress Notes (Signed)
Nathaniel Bolton Date of Birth: 1940-06-22 Medical Record [id number]  History of Present Illness: Mr. Swierk is seen back today for followup of congestive heart failure.  He has a history of congestive heart failure with ejection fraction of 27%. He also has a history of coronary disease with remote coronary bypass surgery. He has a history of ventricular tachycardia.  On follow up today he reports he is doing  well from a cardiac standpoint. Weight is stable. He does have occasional edema and takes an extra lasix 1-2 days/week. No chest pain, dyspnea, or palpitations. In August 2016 he had a sustained episode of VT at 175 bpm. This did not exceed his rate limit and this was later adjusted. No recurrence.   Current Outpatient Prescriptions on File Prior to Visit  Medication Sig Dispense Refill  . allopurinol (ZYLOPRIM) 300 MG tablet Take 1 tablet by mouth daily.    . Ascorbic Acid (VITAMIN C) 1000 MG tablet Take 1,000 mg by mouth daily.    Marland Kitchen aspirin 325 MG tablet Take 325 mg by mouth daily.    . carvedilol (COREG) 12.5 MG tablet Take 6.25 mg by mouth 2 (two) times daily.    . colchicine 0.6 MG tablet Take 0.6 mg by mouth daily. Pt. Taking PRN.    . furosemide (LASIX) 40 MG tablet Take 80 mg by mouth daily. Can take 1 EXTRA tablet by mouth daily as needed for swelling in his feet    . glipiZIDE (GLUCOTROL) 10 MG tablet Take 10 mg by mouth 2 (two) times daily before a meal.      . insulin aspart protamine-insulin aspart (NOVOLOG 70/30) (70-30) 100 UNIT/ML injection Inject 20 Units into the skin 2 (two) times daily with a meal.     . lisinopril (PRINIVIL,ZESTRIL) 10 MG tablet Take 1 tablet (10 mg total) by mouth daily. 30 tablet 6  . NITROSTAT 0.4 MG SL tablet Place 0.4 mg under the tongue every 5 (five) minutes as needed for chest pain (MAX 3 TABLETS).     . NON FORMULARY Needles, syringes with needles, glucose reagent test strips    . Omega-3 Fatty Acids (FISH OIL PO) Take 2 tablets by  mouth 2 (two) times daily.     Marland Kitchen omeprazole (PRILOSEC) 20 MG capsule Take 40 mg by mouth daily.     . Saw Palmetto, Serenoa repens, (SAW PALMETTO PO) Take 1 tablet by mouth daily.     . sertraline (ZOLOFT) 50 MG tablet Take 50 mg by mouth daily.     . simvastatin (ZOCOR) 80 MG tablet Take 80 mg by mouth at bedtime.      Marland Kitchen VITAMIN E PO Take 1 tablet by mouth daily.      No current facility-administered medications on file prior to visit.    Allergies  Allergen Reactions  . Adhesive [Tape] Itching and Rash    Testosterone patch adhesive    Past Medical History  Diagnosis Date  . Myocardial infarction, old     x2 in [place]. S/P CABG approx 9 years ago per Dr. Ricard Dillon  . Diabetes mellitus     Type 2  . Hypertension   . CAD (coronary artery disease)   . Erectile dysfunction   . Gout   . Systolic CHF, acute on chronic (HCC)     EF is 27% per myoview and echo October 2012  . ICD (implantable cardiac defibrillator) in place   . Hyperlipidemia   . Obstructive lung disease (  HCC)     Moderate per prior PFT's  . CKD stage 3 due to type 2 diabetes mellitus ([place])   . Stroke [facility]     2013    Past Surgical History  Procedure Laterality Date  . Bypass graft      x5  . Cardiac defibrillator placement  2008    by Dr Elonda Husky at [facility]  . Cholecystectomy    . Knee arthroscopy      right  . Umbilical hernia repair    . Cataract surgery      History  Smoking status  . Former Smoker  Smokeless tobacco  . Not on file    History  Alcohol Use No    Family History  Problem Relation Age of Onset  . Heart failure Mother   . Heart attack Father   . Heart disease Sister     valve replaced  . Heart attack Brother     CABG, aortic grafting    Review of Systems: The review of systems is as noted in history of present illness. All other systems were reviewed and are negative.  Physical Exam: BP 120/62 mmHg  Pulse 70  Ht 5\' 11"  (1.803 m)  Wt 91.717 kg (202 lb  3.2 oz)  BMI 28.21 kg/m2 Patient is very pleasant and in no acute distress. Skin is warm and dry. Color is normal.   Normocephalic/atraumatic. PERRL. Sclera are nonicteric. Neck is supple. No masses. Mild JVD. Lungs are clear. Cardiac exam shows a regular rate and rhythm. There is no gallop or murmur. Abdomen is soft, obese, and nontender. Extremities reveal tr pretibial edema. Gait and ROM are intact. No gross neurologic deficits noted.   LABORATORY DATA:  Lab Results  Component Value Date   GLUCOSE 297* 03/02/2015   NA 135 03/02/2015   K 4.5 03/02/2015   CL 98 03/02/2015   CREATININE 1.58* 03/02/2015   BUN 34* 03/02/2015   CO2 26 03/02/2015     Assessment / Plan: 1. Chronic systolic CHF.  Well compensated on current therapy. Continue carvedilol, lisinopril. Not a candidate for aldactone due to renal issues. Continue current lasix dose with prn extra as needed.  Stressed importance of sodium restriction.  2.  Status post CVA.  3. Coronary disease with remote myocardial infarctions in 1980 and 1990. Status post CABG. Patient is without anginal symptoms. Myoview study in October of 2012 showed evidence of an old infarct without ischemia. He is asymptomatic.  4. Status post ICD implant. ICD check Mar 02, 1015 was satisfactory. History of VT in August without recurrence. Now wearing an event monitor per EP to rule out afib.   5. CKD stage 3.  6. DM  on insulin- reviewed recommendations for low Carb diet.

## 2015-03-24 ENCOUNTER — Encounter: Payer: Self-pay | Admitting: Internal Medicine

## 2015-04-04 ENCOUNTER — Ambulatory Visit (INDEPENDENT_AMBULATORY_CARE_PROVIDER_SITE_OTHER): Payer: Commercial Managed Care - HMO

## 2015-04-04 DIAGNOSIS — Z95810 Presence of automatic (implantable) cardiac defibrillator: Secondary | ICD-10-CM | POA: Diagnosis not present

## 2015-04-04 DIAGNOSIS — I5022 Chronic systolic (congestive) heart failure: Secondary | ICD-10-CM | POA: Diagnosis not present

## 2015-04-04 NOTE — Progress Notes (Signed)
EPIC Encounter for ICM Monitoring  Patient Name: Nathaniel Bolton is a 75 y.o. male Date: 04/04/2015 Primary Care Physican: Laverna Peace, NP Primary Cardiologist: [place] Electrophysiologist: Allred Dry Weight: unknown    In the past month, have you:  1. Gained more than 2 pounds in a day or more than 5 pounds in a week? no  2. Had changes in your medications (with verification of current medications)? no  3. Had more shortness of breath than is usual for you? no  4. Limited your activity because of shortness of breath? no  5. Not been able to sleep because of shortness of breath? no  6. Had increased swelling in your feet or ankles? no  7. Had symptoms of dehydration (dizziness, dry mouth, increased thirst, decreased urine output) no  8. Had changes in sodium restriction? no  9. Been compliant with medication? Yes   ICM trend: 3 month view for 04/04/2015   ICM trend: 1 year view for 04/04/2015   Follow-up plan: ICM clinic phone appointment on 05/05/2015.  Spoke with wife.  Thoracic impedance trending along reference line.   Wife reported patient has had a little swelling in the feet and he took extra Furosemide dosage as prescribed on 04/03/2015 and 04/04/2015.  He is doing fine now.  Education given to limit sodium intake to < 2000 mg and fluid intake to 64 oz daily.  Wife stated it is difficult to get him to stop eating salt and has put away the salt shaker.  She stated sometimes he gets up in the middle of the night to eat.   Encouraged to call for any fluid symptoms if extra Furosemide does not alleviate fluid symptoms.  No changes today.    Copy of note sent to patient's primary care physician, primary cardiologist, and device following physician.  [name], RN, CCM 04/04/2015 1:13 PM

## 2015-04-13 ENCOUNTER — Ambulatory Visit: Payer: Self-pay | Admitting: Internal Medicine

## 2015-04-13 DIAGNOSIS — J208 Acute bronchitis due to other specified organisms: Secondary | ICD-10-CM | POA: Diagnosis not present

## 2015-04-13 DIAGNOSIS — Z6828 Body mass index (BMI) 28.0-28.9, adult: Secondary | ICD-10-CM | POA: Diagnosis not present

## 2015-04-15 ENCOUNTER — Encounter: Payer: Self-pay | Admitting: Internal Medicine

## 2015-04-15 ENCOUNTER — Ambulatory Visit (INDEPENDENT_AMBULATORY_CARE_PROVIDER_SITE_OTHER): Payer: Commercial Managed Care - HMO | Admitting: Internal Medicine

## 2015-04-15 VITALS — BP 150/72 | HR 78 | Ht 71.0 in | Wt 202.6 lb

## 2015-04-15 DIAGNOSIS — I5022 Chronic systolic (congestive) heart failure: Secondary | ICD-10-CM

## 2015-04-15 DIAGNOSIS — I2583 Coronary atherosclerosis due to lipid rich plaque: Secondary | ICD-10-CM

## 2015-04-15 DIAGNOSIS — I480 Paroxysmal atrial fibrillation: Secondary | ICD-10-CM

## 2015-04-15 DIAGNOSIS — I2510 Atherosclerotic heart disease of native coronary artery without angina pectoris: Secondary | ICD-10-CM

## 2015-04-15 DIAGNOSIS — I472 Ventricular tachycardia, unspecified: Secondary | ICD-10-CM

## 2015-04-15 MED ORDER — APIXABAN 5 MG PO TABS: 5.0000 mg | ORAL_TABLET | Freq: Two times a day (BID) | ORAL | Status: DC

## 2015-04-15 NOTE — Patient Instructions (Signed)
Medication Instructions:  Your physician has recommended you make the following change in your medication:  1) Stop aspirin 2) Start Eliquis 5 mg twice daily   Labwork: None ordered   Testing/Procedures: None ordered   Follow-Up:  Your physician wants you to follow-up in: 12 months with Dr Vallery Ridge will receive a reminder letter in the mail two months in advance. If you don't receive a letter, please call our office to schedule the follow-up appointment.  Remote monitoring is used to monitor your  ICD from home. This monitoring reduces the number of office visits required to check your device to one time per year. It allows [place] to keep an eye on the functioning of your device to ensure it is working properly. You are scheduled for a device check from home on 07/15/15. You may send your transmission at any time that day. If you have a wireless device, the transmission will be sent automatically. After your physician reviews your transmission, you will receive a postcard with your next transmission date.       Any Other Special Instructions Will Be Listed Below (If Applicable).     If you need a refill on your cardiac medications before your next appointment, please call your pharmacy.

## 2015-04-16 ENCOUNTER — Encounter: Payer: Self-pay | Admitting: Internal Medicine

## 2015-04-16 DIAGNOSIS — I4891 Unspecified atrial fibrillation: Secondary | ICD-10-CM | POA: Insufficient documentation

## 2015-04-16 NOTE — Progress Notes (Signed)
Electrophysiology Office Note   Date:  04/16/2015   ID:  Nathaniel Bolton, DOB 1940-05-13, MRN [id number]  PCP:  Laverna Peace, NP  Cardiologist:  Dr Martinique Primary Electrophysiologist: Thompson Grayer, MD    Chief Complaint  Patient presents with  . Follow-up    no chest pain no SOB     History of Present Illness: Nathaniel Bolton is a 75 y.o. male who presents today for electrophysiology evaluation.   He is doing reasonably well.  He remains active in his yard without limitation.  He has SOB with moderate activity but feels that this is mostly controlled.  He denies CP or edema.  He was recently seen by Nathaniel Standard PA-C and had an event monitor placed.  This has documented atrial fibrillation.  He was asymptomatic.  He presents today for further assessment.  Today, he denies symptoms of palpitations, claudication, dizziness, presyncope, syncope, bleeding, or neurologic sequela. The patient is tolerating medications without difficulties and is otherwise without complaint today.    Past Medical History  Diagnosis Date  . Myocardial infarction, old     x2 in [place]. S/P CABG approx 9 years ago per Dr. Ricard Dillon  . Diabetes mellitus     Type 2  . Hypertension   . CAD (coronary artery disease)   . Erectile dysfunction   . Gout   . Systolic CHF, acute on chronic (HCC)     EF is 27% per myoview and echo October 2012  . ICD (implantable cardiac defibrillator) in place   . Hyperlipidemia   . Obstructive lung disease (HCC)     Moderate per prior PFT's  . CKD stage 3 due to type 2 diabetes mellitus ([place])   . Stroke [facility])     2013  . Paroxysmal atrial fibrillation ([place]) 3/17    discovered on event monitor  . Ventricular tachycardia ([place]) 08/2014    175 bpm   Past Surgical History  Procedure Laterality Date  . Bypass graft      x5  . Cardiac defibrillator placement  2008    [facility] by Dr Elonda Husky at [facility]  . Cholecystectomy    . Knee arthroscopy      right  .  Umbilical hernia repair    . Cataract surgery       Current Outpatient Prescriptions  Medication Sig Dispense Refill  . allopurinol (ZYLOPRIM) 300 MG tablet Take 1 tablet by mouth daily.    . Ascorbic Acid (VITAMIN C) 1000 MG tablet Take 1,000 mg by mouth daily.    . carvedilol (COREG) 12.5 MG tablet Take 6.25 mg by mouth 2 (two) times daily.    . colchicine 0.6 MG tablet Take 0.6 mg by mouth daily. Pt. Taking PRN.    Marland Kitchen doxycycline (VIBRAMYCIN) 100 MG capsule Take 1 capsule by mouth as directed.    . furosemide (LASIX) 40 MG tablet Take 80 mg by mouth daily. Can take 1 EXTRA tablet by mouth daily as needed for swelling in his feet    . glipiZIDE (GLUCOTROL) 10 MG tablet Take 10 mg by mouth 2 (two) times daily before a meal.      . insulin aspart protamine-insulin aspart (NOVOLOG 70/30) (70-30) 100 UNIT/ML injection Inject 20 Units into the skin 2 (two) times daily with a meal.     . lisinopril (PRINIVIL,ZESTRIL) 10 MG tablet Take 1 tablet (10 mg total) by mouth daily. 30 tablet 6  . NITROSTAT 0.4 MG SL tablet Place 0.4  mg under the tongue every 5 (five) minutes as needed for chest pain (MAX 3 TABLETS).     . NON FORMULARY Needles, syringes with needles, glucose reagent test strips    . Omega-3 Fatty Acids (FISH OIL PO) Take 2 tablets by mouth 2 (two) times daily.     Marland Kitchen omeprazole (PRILOSEC) 20 MG capsule Take 40 mg by mouth daily.     . predniSONE (DELTASONE) 10 MG tablet Take 1 tablet by mouth as directed.    . Saw Palmetto, Serenoa repens, (SAW PALMETTO PO) Take 1 tablet by mouth daily.     . sertraline (ZOLOFT) 50 MG tablet Take 50 mg by mouth daily.     . simvastatin (ZOCOR) 80 MG tablet Take 80 mg by mouth at bedtime.      Marland Kitchen VITAMIN E PO Take 1 tablet by mouth daily.     Marland Kitchen apixaban (ELIQUIS) 5 MG TABS tablet Take 1 tablet (5 mg total) by mouth 2 (two) times daily. 60 tablet 11   No current facility-administered medications for this visit.    Allergies:   Adhesive   Social  History:  The patient  reports that he has quit smoking. He does not have any smokeless tobacco history on file. He reports that he does not drink alcohol or use illicit drugs.   Family History:  The patient's family history includes Heart attack in his brother and father; Heart disease in his sister; Heart failure in his mother.    ROS:  Please see the history of present illness.   All other systems are reviewed and negative.    PHYSICAL EXAM: VS:  BP 150/72 mmHg  Pulse 78  Ht 5\' 11"  (1.803 m)  Wt 202 lb 9.6 oz (91.899 kg)  BMI 28.27 kg/m2 , BMI Body mass index is 28.27 kg/(m^2). GEN: Well nourished, well developed, in no acute distress HEENT: normal Neck: no JVD, carotid bruits, or masses Cardiac: RRR; no murmurs, rubs, or gallops,no edema  Respiratory:  clear to auscultation bilaterally, normal work of breathing GI: soft, nontender, nondistended, + BS MS: no deformity or atrophy Skin: warm and dry, device pocket is well healed Neuro:  Strength and sensation are intact Psych: euthymic mood, full affect  EKG:  EKG is ordered today. The ekg ordered today shows sinus rhythm, IVCD   Device interrogation is reviewed today in detail.  See PaceArt for details.   Recent Labs: 03/02/2015: BUN 34*; Creat 1.58*; Potassium 4.5; Sodium 135    Lipid Panel  No results found for: CHOL, TRIG, HDL, CHOLHDL, VLDL, LDLCALC, LDLDIRECT   Wt Readings from Last 3 Encounters:  04/15/15 202 lb 9.6 oz (91.899 kg)  03/15/15 202 lb 3.2 oz (91.717 kg)  03/02/15 201 lb (91.173 kg)      Other studies Reviewed: Additional studies/ records that were reviewed today include: Dr Morrison Old notes   ASSESSMENT AND PLAN:  1.  Ischemic CM/ chronic systolic dysfunction Normal ICD function See Pace Art report No changes today 2 gram sodium diet advised  2. CRI Stable No change required today  3. HTN Stable No change required today  4. Obesity Weight loss is advised/ regular exercise  encouraged  5. VT No recent episodes  6. Afib This is a new diagnosis Asymptomatic  This patients CHA2DS2-VASc Score and unadjusted Ischemic Stroke Rate (% per year) is equal to 11.2 % stroke rate/year from a score of 7  Today, I discussed Coumadin as well as novel anticoagulants including Pradaxa, Xarelto, Savaysa,  and Eliquis today as indicated for risk reduction in stroke and systemic emboli with nonvalvular atrial fibrillation.  Risks, benefits, and alternatives to each of these drugs were discussed at length today. He would like to start eliquis.  I will therefore stop asa and start eliquis 5mg  BID. He will follow-up with primary cardiologist.  Current medicines are reviewed at length with the patient today.   The patient does not have concerns regarding his medicines.  The following changes were made today:  none   Follow-up: carelink, follow-up with Dr Martinique as scheduled, return to see EP NP in 1 year  Signed, Thompson Grayer, MD    [facility] [address] [place] [address] [phone] (office) [phone] (fax)

## 2015-04-28 DIAGNOSIS — D539 Nutritional anemia, unspecified: Secondary | ICD-10-CM | POA: Diagnosis not present

## 2015-04-28 DIAGNOSIS — Z1389 Encounter for screening for other disorder: Secondary | ICD-10-CM | POA: Diagnosis not present

## 2015-04-28 DIAGNOSIS — Z139 Encounter for screening, unspecified: Secondary | ICD-10-CM | POA: Diagnosis not present

## 2015-04-28 DIAGNOSIS — Z6828 Body mass index (BMI) 28.0-28.9, adult: Secondary | ICD-10-CM | POA: Diagnosis not present

## 2015-04-28 DIAGNOSIS — M109 Gout, unspecified: Secondary | ICD-10-CM | POA: Diagnosis not present

## 2015-05-05 ENCOUNTER — Ambulatory Visit (INDEPENDENT_AMBULATORY_CARE_PROVIDER_SITE_OTHER): Payer: Commercial Managed Care - HMO

## 2015-05-05 DIAGNOSIS — I5022 Chronic systolic (congestive) heart failure: Secondary | ICD-10-CM

## 2015-05-05 DIAGNOSIS — Z95810 Presence of automatic (implantable) cardiac defibrillator: Secondary | ICD-10-CM | POA: Diagnosis not present

## 2015-05-05 NOTE — Progress Notes (Signed)
EPIC Encounter for ICM Monitoring  Patient Name: Nathaniel Bolton is a 75 y.o. male Date: 05/05/2015 Primary Care Physican: Laverna Peace, NP Primary Cardiologist: Martinique Electrophysiologist: Allred Dry Weight: unknown      In the past month, have you:  1. Gained more than 2 pounds in a day or more than 5 pounds in a week? no  2. Had changes in your medications (with verification of current medications)? no  3. Had more shortness of breath than is usual for you? no  4. Limited your activity because of shortness of breath? no  5. Not been able to sleep because of shortness of breath? no  6. Had increased swelling in your feet or ankles? no  7. Had symptoms of dehydration (dizziness, dry mouth, increased thirst, decreased urine output) no  8. Had changes in sodium restriction? no  9. Been compliant with medication? Yes  ICM trend: 3 month view for 05/05/2015  ICM trend: 1 year view for 05/05/2015   Follow-up plan: ICM clinic phone appointment 06/08/2015.  Spoke with wife, [name] (DPR)  FLUID LEVELS: Optivol thoracic impedance trending along reference line.     SYMPTOMS: None.  She stated patient is doing very well at this time.   Encouraged to call for any fluid symptoms.  EDUCATION: Limit sodium intake to < 2000 mg and fluid intake to 64 oz daily.   No changes today.     [name], RN, CCM 05/05/2015 2:16 PM

## 2015-05-17 DIAGNOSIS — D539 Nutritional anemia, unspecified: Secondary | ICD-10-CM | POA: Diagnosis not present

## 2015-05-17 DIAGNOSIS — Z1211 Encounter for screening for malignant neoplasm of colon: Secondary | ICD-10-CM | POA: Diagnosis not present

## 2015-05-24 DIAGNOSIS — D539 Nutritional anemia, unspecified: Secondary | ICD-10-CM | POA: Diagnosis not present

## 2015-06-08 ENCOUNTER — Ambulatory Visit (INDEPENDENT_AMBULATORY_CARE_PROVIDER_SITE_OTHER): Payer: Commercial Managed Care - HMO | Admitting: *Deleted

## 2015-06-08 DIAGNOSIS — Z95810 Presence of automatic (implantable) cardiac defibrillator: Secondary | ICD-10-CM | POA: Diagnosis not present

## 2015-06-08 DIAGNOSIS — I5022 Chronic systolic (congestive) heart failure: Secondary | ICD-10-CM | POA: Diagnosis not present

## 2015-06-08 DIAGNOSIS — I255 Ischemic cardiomyopathy: Secondary | ICD-10-CM | POA: Diagnosis not present

## 2015-06-08 NOTE — Progress Notes (Signed)
Remote ICD transmission.   

## 2015-06-08 NOTE — Progress Notes (Signed)
EPIC Encounter for ICM Monitoring  Patient Name: Nathaniel Bolton is a 75 y.o. male Date: 06/08/2015 Primary Care Physican: Laverna Peace, NP Primary Cardiologist: Martinique Electrophysiologist: Allred Dry Weight: unknown      In the past month, have you:  1. Gained more than 2 pounds in a day or more than 5 pounds in a week? unknown  2. Had changes in your medications (with verification of current medications)? no  3. Had more shortness of breath than is usual for you? no  4. Limited your activity because of shortness of breath? no  5. Not been able to sleep because of shortness of breath? no  6. Had increased swelling in your feet, ankles, legs or stomach area? no  7. Had symptoms of dehydration (dizziness, dry mouth, increased thirst, decreased urine output) no  8. Had changes in sodium restriction? no  9. Been compliant with medication? Yes  ICM trend: 3 month view for 06/08/2015   ICM trend: 1 year view for 06/08/2015         Follow-up plan: ICM clinic phone appointment 06/10/2015.  Spoke with wife [name] (DPR).   FLUID LEVELS:  Optivol thoracic impedance decreased 06/03/2015 to 06/08/2015 suggesting fluid accumulation.    SYMPTOMS:  Slight swelling in the feet.   Wife reported Furosemide is currently on hold x 5 days due to eye surgery is scheduled for 06/13/2015.  Advised for patient to follow strict low sodium diet and will recheck fluid levels again on 06/10/2015.    RECOMMENDATIONS:  Advised to follow strict low sodium diet and limit fluids to 2 liters a day since he is unable to take Furosemide until after eye surgery.  Will recheck fluid levels 06/10/2015.    Advised will send to PCP, Dr. Jyl Heinz and Dr. Rayann Heman for review slight leg swelling and decreased thoracic impedance.   Patient has been instructed to hold Furosemide x 5 days until eye surgery on 06/13/2015.      [name], RN, CCM 06/08/2015 12:36 PM

## 2015-06-10 ENCOUNTER — Telehealth: Payer: Self-pay

## 2015-06-10 ENCOUNTER — Ambulatory Visit (INDEPENDENT_AMBULATORY_CARE_PROVIDER_SITE_OTHER): Payer: Commercial Managed Care - HMO

## 2015-06-10 DIAGNOSIS — J208 Acute bronchitis due to other specified organisms: Secondary | ICD-10-CM | POA: Diagnosis not present

## 2015-06-10 DIAGNOSIS — Z95810 Presence of automatic (implantable) cardiac defibrillator: Secondary | ICD-10-CM

## 2015-06-10 DIAGNOSIS — J019 Acute sinusitis, unspecified: Secondary | ICD-10-CM | POA: Diagnosis not present

## 2015-06-10 DIAGNOSIS — I5022 Chronic systolic (congestive) heart failure: Secondary | ICD-10-CM

## 2015-06-10 DIAGNOSIS — Z6829 Body mass index (BMI) 29.0-29.9, adult: Secondary | ICD-10-CM | POA: Diagnosis not present

## 2015-06-10 NOTE — Progress Notes (Signed)
EPIC Encounter for ICM Monitoring  Patient Name: Nathaniel Bolton is a 75 y.o. male Date: 06/10/2015 Primary Care Physican: Laverna Peace, NP Primary Cardiologist: Martinique Electrophysiologist: Allred Dry Weight: unknown      In the past month, have you:  1. Gained more than 2 pounds in a day or more than 5 pounds in a week? N/A  2. Had changes in your medications (with verification of current medications)?  Furosemide on hold starting 06/08/2015 x 5 days due to eye surgery scheduled for 06/13/2015  3. Had more shortness of breath than is usual for you? no  4. Limited your activity because of shortness of breath? no  5. Not been able to sleep because of shortness of breath? no  6. Had increased swelling in your feet, ankles, legs or stomach area? no  7. Had symptoms of dehydration (dizziness, dry mouth, increased thirst, decreased urine output) no  8. Had changes in sodium restriction? no  9. Been compliant with medication? Yes  ICM trend: 3 month view for 06/10/2015   ICM trend: 1 year view for 06/28/2015   Follow-up plan: ICM clinic phone appointment 06/21/2015.  Attempted 3 calls to wife for recheck of fluid levels.   FLUID LEVELS:  Since 06/08/2015 remote transmission, Optivol thoracic impedance continues to be below reference line since 06/03/2015 suggesting fluid accumulation.    Labs: 03/02/2015 Creatinine 1.58, BUN 34, K+4.5, Na 135 11/22/2014 Creatinine 1.62, BUN 44, K+ 4.7, Na 137 10/14/2014 Creatinine 1.39, BUN 35, K+ 5.2, Na 135 03/22/2014 Creatinine 1.56 , BUN 33, K+4.5, NA 135  RECOMMENDATIONS:    Unable to make any changes due to could not reach today.  Copy of note sent Dr. Martinique and Dr. Rayann Heman for review of decreased thoracic impedance and Furosemide is on hold x 5 days starting 06/08/2015 due to eye surgery scheduled on 06/13/2015.    [name], RN, CCM 06/10/2015 12:09 PM

## 2015-06-10 NOTE — Telephone Encounter (Signed)
Remote ICM transmission received.  Attempted 3 calls to wife/patient and left messages for return call.

## 2015-06-21 ENCOUNTER — Telehealth: Payer: Self-pay

## 2015-06-21 ENCOUNTER — Ambulatory Visit (INDEPENDENT_AMBULATORY_CARE_PROVIDER_SITE_OTHER): Payer: Commercial Managed Care - HMO

## 2015-06-21 DIAGNOSIS — Z95810 Presence of automatic (implantable) cardiac defibrillator: Secondary | ICD-10-CM

## 2015-06-21 DIAGNOSIS — I5022 Chronic systolic (congestive) heart failure: Secondary | ICD-10-CM

## 2015-06-21 NOTE — Telephone Encounter (Signed)
Remote ICM transmission received.  Attempted call to spouse and left message for return call.

## 2015-06-21 NOTE — Progress Notes (Signed)
EPIC Encounter for ICM Monitoring  Patient Name: Nathaniel Bolton is a 75 y.o. male Date: 06/21/2015 Primary Care Physican: Laverna Peace, NP Primary Cardiologist: Martinique Electrophysiologist: Allred Dry Weight: unknown      In the past month, have you:  1. Gained more than 2 pounds in a day or more than 5 pounds in a week? N/A  2. Had changes in your medications (with verification of current medications)? N/A  3. Had more shortness of breath than is usual for you? N/A  4. Limited your activity because of shortness of breath? N/A  5. Not been able to sleep because of shortness of breath? N/A  6. Had increased swelling in your feet, ankles, legs or stomach area? N/A  7. Had symptoms of dehydration (dizziness, dry mouth, increased thirst, decreased urine output) N/A  8. Had changes in sodium restriction? N/A  9. Been compliant with medication? N/A  ICM trend: 3 month view for 06/21/2015   ICM trend: 1 year view for 06/21/2015   Follow-up plan: ICM clinic phone appointment 07/26/2015.  Attempted call to spouse, [name] and unable to reach.  Transmission reviewed.    FLUID LEVELS:  Since last ICM transmission 06/10/2015, Optivol thoracic impedance improved and trending closer to baseline which correlates with patient resuming Furosemide after having eye surygery.  Fluid index remains slightly above threshold.    [name], RN, CCM 06/21/2015 1:12 PM

## 2015-06-23 LAB — CUP PACEART REMOTE DEVICE CHECK
Battery Voltage: 2.76 V
Battery Voltage: 2.75 V
Date Time Interrogation Session: 20170531052307
Date Time Interrogation Session: 20170613082408
HighPow Impedance: 44 Ohm
HighPow Impedance: 56 Ohm
HighPow Impedance: 42 Ohm
HighPow Impedance: 53 Ohm
Implantable Lead Implant Date: 20091217
Implantable Lead Implant Date: 20091217
Implantable Lead Location: 753860
Implantable Lead Location: 753860
Implantable Lead Model: 6947
Implantable Lead Model: 6947
Lead Channel Pacing Threshold Amplitude: 1.125 V
Lead Channel Sensing Intrinsic Amplitude: 3.125 mV
Lead Channel Sensing Intrinsic Amplitude: 3.875 mV
Lead Channel Setting Pacing Pulse Width: 0.4 ms
Lead Channel Setting Pacing Pulse Width: 0.4 ms
Lead Channel Setting Sensing Sensitivity: 0.3 mV
MDC IDC MSMT LEADCHNL RV IMPEDANCE VALUE: 342 Ohm
MDC IDC MSMT LEADCHNL RV IMPEDANCE VALUE: 342 Ohm
MDC IDC MSMT LEADCHNL RV PACING THRESHOLD AMPLITUDE: 1 V
MDC IDC MSMT LEADCHNL RV PACING THRESHOLD PULSEWIDTH: 0.4 ms
MDC IDC MSMT LEADCHNL RV PACING THRESHOLD PULSEWIDTH: 0.4 ms
MDC IDC MSMT LEADCHNL RV SENSING INTR AMPL: 3.125 mV
MDC IDC MSMT LEADCHNL RV SENSING INTR AMPL: 3.875 mV
MDC IDC SET LEADCHNL RV PACING AMPLITUDE: 2.5 V
MDC IDC SET LEADCHNL RV PACING AMPLITUDE: 2.5 V
MDC IDC SET LEADCHNL RV SENSING SENSITIVITY: 0.3 mV
MDC IDC STAT BRADY RV PERCENT PACED: 0.03 %
MDC IDC STAT BRADY RV PERCENT PACED: 0.02 %

## 2015-06-29 ENCOUNTER — Encounter: Payer: Self-pay | Admitting: Cardiology

## 2015-07-14 DIAGNOSIS — S90425A Blister (nonthermal), left lesser toe(s), initial encounter: Secondary | ICD-10-CM | POA: Diagnosis not present

## 2015-07-14 DIAGNOSIS — Z6829 Body mass index (BMI) 29.0-29.9, adult: Secondary | ICD-10-CM | POA: Diagnosis not present

## 2015-07-14 DIAGNOSIS — Z9181 History of falling: Secondary | ICD-10-CM | POA: Diagnosis not present

## 2015-07-14 DIAGNOSIS — E663 Overweight: Secondary | ICD-10-CM | POA: Diagnosis not present

## 2015-07-14 DIAGNOSIS — S90424A Blister (nonthermal), right lesser toe(s), initial encounter: Secondary | ICD-10-CM | POA: Diagnosis not present

## 2015-07-15 DIAGNOSIS — E1165 Type 2 diabetes mellitus with hyperglycemia: Secondary | ICD-10-CM | POA: Diagnosis not present

## 2015-07-15 DIAGNOSIS — I081 Rheumatic disorders of both mitral and tricuspid valves: Secondary | ICD-10-CM | POA: Diagnosis not present

## 2015-07-15 DIAGNOSIS — I480 Paroxysmal atrial fibrillation: Secondary | ICD-10-CM | POA: Diagnosis not present

## 2015-07-15 DIAGNOSIS — I25118 Atherosclerotic heart disease of native coronary artery with other forms of angina pectoris: Secondary | ICD-10-CM | POA: Diagnosis not present

## 2015-07-15 DIAGNOSIS — L120 Bullous pemphigoid: Secondary | ICD-10-CM | POA: Diagnosis not present

## 2015-07-15 DIAGNOSIS — N189 Chronic kidney disease, unspecified: Secondary | ICD-10-CM | POA: Diagnosis not present

## 2015-07-15 DIAGNOSIS — I130 Hypertensive heart and chronic kidney disease with heart failure and stage 1 through stage 4 chronic kidney disease, or unspecified chronic kidney disease: Secondary | ICD-10-CM | POA: Diagnosis not present

## 2015-07-15 DIAGNOSIS — I25810 Atherosclerosis of coronary artery bypass graft(s) without angina pectoris: Secondary | ICD-10-CM | POA: Diagnosis not present

## 2015-07-15 DIAGNOSIS — I493 Ventricular premature depolarization: Secondary | ICD-10-CM | POA: Diagnosis not present

## 2015-07-15 DIAGNOSIS — R55 Syncope and collapse: Secondary | ICD-10-CM | POA: Diagnosis not present

## 2015-07-15 DIAGNOSIS — E1140 Type 2 diabetes mellitus with diabetic neuropathy, unspecified: Secondary | ICD-10-CM | POA: Diagnosis not present

## 2015-07-15 DIAGNOSIS — I472 Ventricular tachycardia, unspecified: Secondary | ICD-10-CM | POA: Insufficient documentation

## 2015-07-15 DIAGNOSIS — Z951 Presence of aortocoronary bypass graft: Secondary | ICD-10-CM | POA: Insufficient documentation

## 2015-07-15 DIAGNOSIS — I272 Other secondary pulmonary hypertension: Secondary | ICD-10-CM | POA: Diagnosis not present

## 2015-07-15 DIAGNOSIS — E1142 Type 2 diabetes mellitus with diabetic polyneuropathy: Secondary | ICD-10-CM | POA: Diagnosis not present

## 2015-07-15 DIAGNOSIS — Z95810 Presence of automatic (implantable) cardiac defibrillator: Secondary | ICD-10-CM | POA: Insufficient documentation

## 2015-07-15 DIAGNOSIS — E871 Hypo-osmolality and hyponatremia: Secondary | ICD-10-CM | POA: Diagnosis not present

## 2015-07-15 DIAGNOSIS — I2510 Atherosclerotic heart disease of native coronary artery without angina pectoris: Secondary | ICD-10-CM | POA: Diagnosis not present

## 2015-07-15 DIAGNOSIS — I499 Cardiac arrhythmia, unspecified: Secondary | ICD-10-CM | POA: Diagnosis not present

## 2015-07-15 DIAGNOSIS — E119 Type 2 diabetes mellitus without complications: Secondary | ICD-10-CM | POA: Diagnosis not present

## 2015-07-15 DIAGNOSIS — I4729 Other ventricular tachycardia: Secondary | ICD-10-CM | POA: Insufficient documentation

## 2015-07-15 DIAGNOSIS — Z794 Long term (current) use of insulin: Secondary | ICD-10-CM | POA: Diagnosis not present

## 2015-07-15 DIAGNOSIS — E13622 Other specified diabetes mellitus with other skin ulcer: Secondary | ICD-10-CM | POA: Diagnosis not present

## 2015-07-15 DIAGNOSIS — I252 Old myocardial infarction: Secondary | ICD-10-CM | POA: Diagnosis not present

## 2015-07-15 DIAGNOSIS — Z7901 Long term (current) use of anticoagulants: Secondary | ICD-10-CM | POA: Diagnosis not present

## 2015-07-15 DIAGNOSIS — M7989 Other specified soft tissue disorders: Secondary | ICD-10-CM | POA: Diagnosis not present

## 2015-07-15 DIAGNOSIS — E1169 Type 2 diabetes mellitus with other specified complication: Secondary | ICD-10-CM | POA: Diagnosis not present

## 2015-07-15 DIAGNOSIS — L0390 Cellulitis, unspecified: Secondary | ICD-10-CM | POA: Diagnosis not present

## 2015-07-15 DIAGNOSIS — I501 Left ventricular failure: Secondary | ICD-10-CM | POA: Diagnosis not present

## 2015-07-15 DIAGNOSIS — I495 Sick sinus syndrome: Secondary | ICD-10-CM | POA: Diagnosis not present

## 2015-07-15 DIAGNOSIS — E1159 Type 2 diabetes mellitus with other circulatory complications: Secondary | ICD-10-CM | POA: Diagnosis not present

## 2015-07-15 DIAGNOSIS — L97523 Non-pressure chronic ulcer of other part of left foot with necrosis of muscle: Secondary | ICD-10-CM | POA: Diagnosis not present

## 2015-07-15 DIAGNOSIS — N179 Acute kidney failure, unspecified: Secondary | ICD-10-CM | POA: Diagnosis not present

## 2015-07-15 DIAGNOSIS — T82897A Other specified complication of cardiac prosthetic devices, implants and grafts, initial encounter: Secondary | ICD-10-CM | POA: Diagnosis not present

## 2015-07-15 DIAGNOSIS — I10 Essential (primary) hypertension: Secondary | ICD-10-CM | POA: Insufficient documentation

## 2015-07-15 DIAGNOSIS — E1122 Type 2 diabetes mellitus with diabetic chronic kidney disease: Secondary | ICD-10-CM | POA: Diagnosis not present

## 2015-07-15 DIAGNOSIS — I255 Ischemic cardiomyopathy: Secondary | ICD-10-CM | POA: Diagnosis not present

## 2015-07-15 DIAGNOSIS — I25718 Atherosclerosis of autologous vein coronary artery bypass graft(s) with other forms of angina pectoris: Secondary | ICD-10-CM | POA: Diagnosis not present

## 2015-07-15 DIAGNOSIS — I214 Non-ST elevation (NSTEMI) myocardial infarction: Secondary | ICD-10-CM | POA: Diagnosis not present

## 2015-07-15 DIAGNOSIS — I5022 Chronic systolic (congestive) heart failure: Secondary | ICD-10-CM | POA: Diagnosis not present

## 2015-07-15 DIAGNOSIS — I1310 Hypertensive heart and chronic kidney disease without heart failure, with stage 1 through stage 4 chronic kidney disease, or unspecified chronic kidney disease: Secondary | ICD-10-CM | POA: Diagnosis not present

## 2015-07-15 DIAGNOSIS — L97521 Non-pressure chronic ulcer of other part of left foot limited to breakdown of skin: Secondary | ICD-10-CM | POA: Diagnosis not present

## 2015-07-15 DIAGNOSIS — D72829 Elevated white blood cell count, unspecified: Secondary | ICD-10-CM | POA: Diagnosis not present

## 2015-07-15 DIAGNOSIS — E11621 Type 2 diabetes mellitus with foot ulcer: Secondary | ICD-10-CM | POA: Diagnosis not present

## 2015-07-15 DIAGNOSIS — L97529 Non-pressure chronic ulcer of other part of left foot with unspecified severity: Secondary | ICD-10-CM | POA: Diagnosis not present

## 2015-07-15 DIAGNOSIS — E785 Hyperlipidemia, unspecified: Secondary | ICD-10-CM | POA: Insufficient documentation

## 2015-07-15 DIAGNOSIS — L03115 Cellulitis of right lower limb: Secondary | ICD-10-CM | POA: Diagnosis not present

## 2015-07-26 ENCOUNTER — Telehealth: Payer: Self-pay | Admitting: Cardiology

## 2015-07-26 NOTE — Telephone Encounter (Signed)
Attempted to confirm remote transmission with pt. No answer and was unable to leave a message.   

## 2015-07-28 DIAGNOSIS — I2510 Atherosclerotic heart disease of native coronary artery without angina pectoris: Secondary | ICD-10-CM | POA: Diagnosis not present

## 2015-07-28 DIAGNOSIS — E11622 Type 2 diabetes mellitus with other skin ulcer: Secondary | ICD-10-CM | POA: Diagnosis not present

## 2015-07-28 DIAGNOSIS — N179 Acute kidney failure, unspecified: Secondary | ICD-10-CM | POA: Diagnosis not present

## 2015-07-28 DIAGNOSIS — I255 Ischemic cardiomyopathy: Secondary | ICD-10-CM | POA: Diagnosis not present

## 2015-07-28 DIAGNOSIS — Z79899 Other long term (current) drug therapy: Secondary | ICD-10-CM | POA: Diagnosis not present

## 2015-07-28 DIAGNOSIS — E1129 Type 2 diabetes mellitus with other diabetic kidney complication: Secondary | ICD-10-CM | POA: Diagnosis not present

## 2015-07-28 DIAGNOSIS — I10 Essential (primary) hypertension: Secondary | ICD-10-CM | POA: Diagnosis not present

## 2015-07-28 DIAGNOSIS — Z6829 Body mass index (BMI) 29.0-29.9, adult: Secondary | ICD-10-CM | POA: Diagnosis not present

## 2015-07-28 DIAGNOSIS — I472 Ventricular tachycardia: Secondary | ICD-10-CM | POA: Diagnosis not present

## 2015-07-28 NOTE — Progress Notes (Signed)
No ICM remote transmission received for scheduled date 07/26/2015 and next transmission 08/05/2015.

## 2015-08-05 ENCOUNTER — Telehealth: Payer: Self-pay | Admitting: Cardiology

## 2015-08-05 DIAGNOSIS — R001 Bradycardia, unspecified: Secondary | ICD-10-CM | POA: Diagnosis not present

## 2015-08-05 NOTE — Telephone Encounter (Signed)
Confirmed remote transmission w/ pt wife.   

## 2015-08-08 NOTE — Progress Notes (Signed)
No ICM remote transmission received for 08/05/2015.  Next ICM remote transmission scheduled 09/07/2015.

## 2015-08-09 DIAGNOSIS — E1142 Type 2 diabetes mellitus with diabetic polyneuropathy: Secondary | ICD-10-CM | POA: Diagnosis not present

## 2015-08-09 DIAGNOSIS — L97521 Non-pressure chronic ulcer of other part of left foot limited to breakdown of skin: Secondary | ICD-10-CM | POA: Diagnosis not present

## 2015-08-12 DIAGNOSIS — L97521 Non-pressure chronic ulcer of other part of left foot limited to breakdown of skin: Secondary | ICD-10-CM | POA: Insufficient documentation

## 2015-08-12 DIAGNOSIS — E1142 Type 2 diabetes mellitus with diabetic polyneuropathy: Secondary | ICD-10-CM | POA: Insufficient documentation

## 2015-08-23 DIAGNOSIS — L97521 Non-pressure chronic ulcer of other part of left foot limited to breakdown of skin: Secondary | ICD-10-CM | POA: Diagnosis not present

## 2015-08-23 DIAGNOSIS — E1142 Type 2 diabetes mellitus with diabetic polyneuropathy: Secondary | ICD-10-CM | POA: Diagnosis not present

## 2015-08-29 DIAGNOSIS — E1129 Type 2 diabetes mellitus with other diabetic kidney complication: Secondary | ICD-10-CM | POA: Diagnosis not present

## 2015-08-29 DIAGNOSIS — N189 Chronic kidney disease, unspecified: Secondary | ICD-10-CM | POA: Diagnosis not present

## 2015-08-29 DIAGNOSIS — I10 Essential (primary) hypertension: Secondary | ICD-10-CM | POA: Diagnosis not present

## 2015-09-06 DIAGNOSIS — L97521 Non-pressure chronic ulcer of other part of left foot limited to breakdown of skin: Secondary | ICD-10-CM | POA: Diagnosis not present

## 2015-09-06 DIAGNOSIS — E1142 Type 2 diabetes mellitus with diabetic polyneuropathy: Secondary | ICD-10-CM | POA: Diagnosis not present

## 2015-09-07 ENCOUNTER — Ambulatory Visit: Payer: Commercial Managed Care - HMO

## 2015-09-07 ENCOUNTER — Telehealth: Payer: Self-pay

## 2015-09-07 ENCOUNTER — Ambulatory Visit (INDEPENDENT_AMBULATORY_CARE_PROVIDER_SITE_OTHER): Payer: Commercial Managed Care - HMO | Admitting: *Deleted

## 2015-09-07 DIAGNOSIS — Z95810 Presence of automatic (implantable) cardiac defibrillator: Secondary | ICD-10-CM | POA: Diagnosis not present

## 2015-09-07 DIAGNOSIS — I5022 Chronic systolic (congestive) heart failure: Secondary | ICD-10-CM

## 2015-09-07 DIAGNOSIS — I255 Ischemic cardiomyopathy: Secondary | ICD-10-CM

## 2015-09-07 LAB — CUP PACEART REMOTE DEVICE CHECK
Battery Voltage: 2.65 V
Brady Statistic RV Percent Paced: 0.25 %
HIGH POWER IMPEDANCE MEASURED VALUE: 40 Ohm
HIGH POWER IMPEDANCE MEASURED VALUE: 49 Ohm
Implantable Lead Implant Date: 20091217
Lead Channel Pacing Threshold Amplitude: 1.125 V
Lead Channel Sensing Intrinsic Amplitude: 4.625 mV
Lead Channel Sensing Intrinsic Amplitude: 4.625 mV
Lead Channel Setting Pacing Amplitude: 2.5 V
Lead Channel Setting Pacing Pulse Width: 0.4 ms
MDC IDC LEAD LOCATION: 753860
MDC IDC MSMT LEADCHNL RV IMPEDANCE VALUE: 323 Ohm
MDC IDC MSMT LEADCHNL RV PACING THRESHOLD PULSEWIDTH: 0.4 ms
MDC IDC SESS DTM: 20170830083828
MDC IDC SET LEADCHNL RV SENSING SENSITIVITY: 0.3 mV

## 2015-09-07 NOTE — Progress Notes (Signed)
EPIC Encounter for ICM Monitoring  Patient Name: Nathaniel Bolton is a 75 y.o. male Date: 09/07/2015 Primary Care Physican: Laverna Peace, NP Primary Cardiologist: Martinique Electrophysiologist: Allred Dry Weight: unknown     Spoke with wife. HF questions reviewed and patient asymptomatic.  She stated patient had hospitalization for heart attack mid July.  Med list updated.  Furosemide was decreased from 80 mg daily to 20 mg daily at time of hospital discharge.  He has followed up with Laverna Peace NP and will making appointment with Dr Martinique.  Thoracic impedance abnormal suggesting fluid accumulation since 06/08/2015 with exception of a few days at baseline.  Labs: 08/29/2015 Creatinine 1.31, BUN 19, Potassium 4.5, Sodium 141, GFR 54-62 - obtained from [name]'s office  08/05/2015 Creatinine 1.61, BUN 24, Potassium 4.5, Sodium 139, GFR 42-48 07/24/2015 Creatinine 3.31, BUN 45, Potassium 4.7, Sodium 139, GFR 18-22 07/23/2015 Creatinine 4.05, BUN 54, Potassium 4.2, Sodium 140, GFR 15-18 07/22/2015 Creatinine 4.87, BUN 58, Potassium 4.2, Sodium 136, GFR 12-14 07/21/2015 Creatinine 4.58, BUN 52, Potassium 4.4, Sodium 133, GFR 13-15 07/20/2015 Creatinine 3.45, BUN 42, Potassium 3.9, Sodium 135, GFR 18-21  07/19/2015 Creatinine 1.71, BUN 28, Potassium 3.9, Sodium 138, GFR 39-48 07/18/2015 Creatinine 1.44, BUN 26, Potassium 3.9, Sodium 140, GFR 48-58 07/17/2015 Creatinine 1.28, BUN 23, Potassium 3.5, Sodium 141, GFR 55->60  07/16/2015 Creatinine 1.35, BUN 24, Potassium 3.9, Sodium 137, GFR 55->60 07/15/2015 Creatinine 1.13, BUN 25, Potassium 3.5, Sodium 141, GFR >60 03/02/2015 Creatinine 1.58, BUN 34, Potassium 4.5, Sodium 135 11/22/2014 Creatinine 1.62, BUN 44, Potassium 4.7, Sodium 137 10/14/2014 Creatinine 1.39, BUN 35, Potassium, 5.2, Sodium 135 03/22/2014 Creatinine 1.56 , BUN 33, Potassium 4.5, Sodium 135   Recommendations:   Copy of ICM check sent to Dr Martinique and Dr Rayann Heman for review and  recommendations.   Follow-up plan: ICM clinic phone appointment on 09/14/2015.    ICM trend: 09/07/2015       [name], RN 09/07/2015 9:36 AM

## 2015-09-07 NOTE — Telephone Encounter (Signed)
Remote ICM transmission received.  Attempted call to wife and left message for return call.

## 2015-09-07 NOTE — Progress Notes (Signed)
Remote ICD transmission.   

## 2015-09-09 ENCOUNTER — Encounter: Payer: Self-pay | Admitting: Cardiology

## 2015-09-09 DIAGNOSIS — N189 Chronic kidney disease, unspecified: Secondary | ICD-10-CM | POA: Diagnosis not present

## 2015-09-09 DIAGNOSIS — I509 Heart failure, unspecified: Secondary | ICD-10-CM | POA: Diagnosis not present

## 2015-09-09 DIAGNOSIS — E1169 Type 2 diabetes mellitus with other specified complication: Secondary | ICD-10-CM | POA: Diagnosis not present

## 2015-09-09 DIAGNOSIS — E1140 Type 2 diabetes mellitus with diabetic neuropathy, unspecified: Secondary | ICD-10-CM | POA: Diagnosis not present

## 2015-09-09 DIAGNOSIS — E782 Mixed hyperlipidemia: Secondary | ICD-10-CM | POA: Diagnosis not present

## 2015-09-09 DIAGNOSIS — I10 Essential (primary) hypertension: Secondary | ICD-10-CM | POA: Diagnosis not present

## 2015-09-09 DIAGNOSIS — L97523 Non-pressure chronic ulcer of other part of left foot with necrosis of muscle: Secondary | ICD-10-CM | POA: Diagnosis not present

## 2015-09-09 DIAGNOSIS — R809 Proteinuria, unspecified: Secondary | ICD-10-CM | POA: Diagnosis not present

## 2015-09-09 DIAGNOSIS — E309 Disorder of puberty, unspecified: Secondary | ICD-10-CM | POA: Diagnosis not present

## 2015-09-09 NOTE — Progress Notes (Signed)
Reviewed with Chanetta Marshall, NP in office.  Recommended to take Furosemide 40 mg daily x 3 days only and return to prescribed dosage of 20 mg daily.  BMET and BNP in a week.

## 2015-09-09 NOTE — Progress Notes (Signed)
Spoke with wife.  Advised of recommendation from Chanetta Marshall, NP in the office.  Recommended to take Furosemide 40 mg daily x 3 days only and return to prescribed dosage of 20 mg daily.  She reported she patient can start the Furosemide increase tomorrow because he has 2 appointments this afternoon.  BMET and BNP in a week and she stated she can come to the office on 09/15/2015.  Will recheck fluid levels and next remote transmission will be 09/14/2015.   She verbalized understanding.

## 2015-09-09 NOTE — Progress Notes (Signed)
Letter  

## 2015-09-14 ENCOUNTER — Telehealth: Payer: Self-pay | Admitting: Nurse Practitioner

## 2015-09-14 ENCOUNTER — Ambulatory Visit (INDEPENDENT_AMBULATORY_CARE_PROVIDER_SITE_OTHER): Payer: Commercial Managed Care - HMO

## 2015-09-14 DIAGNOSIS — Z95810 Presence of automatic (implantable) cardiac defibrillator: Secondary | ICD-10-CM

## 2015-09-14 DIAGNOSIS — I5022 Chronic systolic (congestive) heart failure: Secondary | ICD-10-CM | POA: Diagnosis not present

## 2015-09-14 NOTE — Telephone Encounter (Signed)
Received records from [facility] for appointment on 09/29/15 with Ignacia Bayley, NP.  Records given to [employer] (medical records) for Chris's schedule on 09/29/15. lp

## 2015-09-14 NOTE — Progress Notes (Signed)
EPIC Encounter for ICM Monitoring  Patient Name: Nathaniel Bolton is a 75 y.o. male Date: 09/14/2015 Primary Care Physican: Laverna Peace, NP Primary [place] Electrophysiologist: Allred Dry Weight: 189 lb         Spoke with wife.  Heart Failure questions reviewed, pt asymptomatic.  She reported the cancer physician told patient last week he had fluid in lungs and is now resolved.     After increase in Furosemide on 09/07/2015 for 3 days, thoracic impedance returned to normal.  Labs: 08/29/2015 Creatinine 1.31, BUN 19, Potassium 4.5, Sodium 141, GFR 54-62 - obtained from [name]'s office  08/05/2015 Creatinine 1.61, BUN 24, Potassium 4.5, Sodium 139, GFR 42-48 07/24/2015 Creatinine 3.31, BUN 45, Potassium 4.7, Sodium 139, GFR 18-22 07/23/2015 Creatinine 4.05, BUN 54, Potassium 4.2, Sodium 140, GFR 15-18 07/22/2015 Creatinine 4.87, BUN 58, Potassium 4.2, Sodium 136, GFR 12-14 07/21/2015 Creatinine 4.58, BUN 52, Potassium 4.4, Sodium 133, GFR 13-15 07/20/2015 Creatinine 3.45, BUN 42, Potassium 3.9, Sodium 135, GFR 18-21  07/19/2015 Creatinine 1.71, BUN 28, Potassium 3.9, Sodium 138, GFR 39-48 07/18/2015 Creatinine 1.44, BUN 26, Potassium 3.9, Sodium 140, GFR 48-58 07/17/2015 Creatinine 1.28, BUN 23, Potassium 3.5, Sodium 141, GFR 55->60  07/16/2015 Creatinine 1.35, BUN 24, Potassium 3.9, Sodium 137, GFR 55->60 07/15/2015 Creatinine 1.13, BUN 25, Potassium 3.5, Sodium 141, GFR >60 03/02/2015 Creatinine 1.58, BUN 34, Potassium 4.5, Sodium 135 11/22/2014 Creatinine 1.62, BUN 44, Potassium 4.7, Sodium 137 10/14/2014 Creatinine 1.39, BUN 35, Potassium, 5.2, Sodium 135 03/22/2014 Creatinine 1.56 , BUN 33, Potassium 4.5, Sodium 135   Recommendations:  Labs scheduled for 08/15/2015    Follow-up plan: ICM clinic phone appointment on 09/28/2015.  Office appointment with Ignacia Bayley, PA on 09/29/2015.  Copy of ICM check sent to PA, primary cardiologist and device physician.   ICM trend:  09/14/2015       [name], RN 09/14/2015 10:17 AM

## 2015-09-15 ENCOUNTER — Other Ambulatory Visit: Payer: Commercial Managed Care - HMO | Admitting: *Deleted

## 2015-09-15 ENCOUNTER — Encounter (INDEPENDENT_AMBULATORY_CARE_PROVIDER_SITE_OTHER): Payer: Self-pay

## 2015-09-15 DIAGNOSIS — I5022 Chronic systolic (congestive) heart failure: Secondary | ICD-10-CM

## 2015-09-15 LAB — BASIC METABOLIC PANEL
BUN: 24 mg/dL (ref 7–25)
CALCIUM: 8.7 mg/dL (ref 8.6–10.3)
CO2: 27 mmol/L (ref 20–31)
Chloride: 103 mmol/L (ref 98–110)
Creat: 1.28 mg/dL — ABNORMAL HIGH (ref 0.70–1.18)
Glucose, Bld: 100 mg/dL — ABNORMAL HIGH (ref 65–99)
Potassium: 4.2 mmol/L (ref 3.5–5.3)
SODIUM: 139 mmol/L (ref 135–146)

## 2015-09-16 LAB — BRAIN NATRIURETIC PEPTIDE: BRAIN NATRIURETIC PEPTIDE: 98.3 pg/mL (ref <100)

## 2015-09-19 ENCOUNTER — Encounter: Payer: Self-pay | Admitting: Physician Assistant

## 2015-09-19 ENCOUNTER — Ambulatory Visit (INDEPENDENT_AMBULATORY_CARE_PROVIDER_SITE_OTHER): Payer: Commercial Managed Care - HMO | Admitting: Physician Assistant

## 2015-09-19 ENCOUNTER — Telehealth: Payer: Self-pay | Admitting: *Deleted

## 2015-09-19 VITALS — BP 147/74 | HR 55 | Ht 71.0 in

## 2015-09-19 DIAGNOSIS — I5022 Chronic systolic (congestive) heart failure: Secondary | ICD-10-CM

## 2015-09-19 DIAGNOSIS — I480 Paroxysmal atrial fibrillation: Secondary | ICD-10-CM

## 2015-09-19 DIAGNOSIS — I25810 Atherosclerosis of coronary artery bypass graft(s) without angina pectoris: Secondary | ICD-10-CM | POA: Diagnosis not present

## 2015-09-19 DIAGNOSIS — Z7901 Long term (current) use of anticoagulants: Secondary | ICD-10-CM | POA: Diagnosis not present

## 2015-09-19 MED ORDER — APIXABAN 5 MG PO TABS: 5.0000 mg | ORAL_TABLET | Freq: Two times a day (BID) | ORAL | 0 refills | Status: DC

## 2015-09-19 MED ORDER — APIXABAN 5 MG PO TABS: 5.0000 mg | ORAL_TABLET | Freq: Two times a day (BID) | ORAL | 6 refills | Status: DC

## 2015-09-19 NOTE — Telephone Encounter (Signed)
Left msg w/ wife on her phone requesting return call. As noted on DPR, acceptable to leave message w/ wife, no other connectable phone number on file.

## 2015-09-19 NOTE — Progress Notes (Signed)
Cardiology Office Note   Date:  09/19/2015   ID:  Nathaniel Bolton, DOB [birth year], MRN [id number]  PCP:  Nathaniel Peace, NP  Cardiologist:  Dr Nathaniel Bolton, Dr Nathaniel  Lachae Hohler, PA-C   Chief Complaint  Patient presents with  . Follow-up    Pt states edema in feet occasionally, Pt states no other Sx or concerns.    History of Present Illness: Nathaniel Bolton is a 75 y.o. male with a history of DM, CKD III, HTN, HLD, ED, OLD, atrial fib (CHA2DS2-VASc=7, DM, HTN, CAD, CHF, age x 1, CVA x 2) on Eliquis, CABG, VT, MDT ICD, ICM w/ EF 27% by MV 2012 (25-30% by echo 2012)  Recent ICD tracings w/ ?increased volume, pt called to come in.  Nathaniel Bolton presents for Management of his multiple cardiac issues.  He has not had any chest pain. He has been compliant with his medications. He is following his weight. He has noticed some lower extremity edema, but not much weight change. He does not know why the operative all would have thought he was so volume overloaded. He has had some mild lower extremity edema and a small amount of increased abdominal girth, but his weight is only up to-3 pounds.  He is trying to watch his salt and stick tightly to a diabetic diet. He is doing better with the diabetic low-sodium diet now and this is helping him feel better. In general, he feels that he is stable from a cardiac standpoint, but his weight is up a little.   Past Medical History:  Diagnosis Date  . CAD (coronary artery disease)   . CKD stage 3 due to type 2 diabetes mellitus ([place])   . Diabetes mellitus    Type 2  . Erectile dysfunction   . Gout   . Hyperlipidemia   . Hypertension   . ICD (implantable cardiac defibrillator) in place   . Myocardial infarction, old    x2 in [place]. S/P CABG 2003, Dr Roxy Manns  . Obstructive lung disease (HCC)    Moderate per prior PFT's  . Paroxysmal atrial fibrillation ([place]) 3/17   discovered on event monitor  . Stroke [facility])    2013  . Systolic CHF,  acute on chronic (HCC)    EF is 27% per myoview and echo October 2012  . Ventricular tachycardia ([place]) 08/2014   175 bpm    Past Surgical History:  Procedure Laterality Date  . BYPASS GRAFT     x5  . CARDIAC DEFIBRILLATOR PLACEMENT  2008   [facility] by Dr Elonda Husky at [facility]  . cataract surgery    . CHOLECYSTECTOMY    . KNEE ARTHROSCOPY     right  . UMBILICAL HERNIA REPAIR      Current Outpatient Prescriptions  Medication Sig Dispense Refill  . allopurinol (ZYLOPRIM) 100 MG tablet Take 100 mg by mouth.    Marland Kitchen amiodarone (PACERONE) 200 MG tablet Take 200 mg by mouth.    . Ascorbic Acid (VITAMIN C) 1000 MG tablet Take 1,000 mg by mouth daily.    Marland Kitchen aspirin 81 MG chewable tablet Chew 81 mg by mouth.    Marland Kitchen atorvastatin (LIPITOR) 40 MG tablet Take 40 mg by mouth.    . carvedilol (COREG) 12.5 MG tablet Take 6.25 mg by mouth.    . furosemide (LASIX) 40 MG tablet Take 20 mg by mouth.    Marland Kitchen glipiZIDE (GLUCOTROL) 5 MG tablet Take 2.5 mg by mouth.    Marland Kitchen  insulin aspart protamine- aspart (NOVOLOG MIX 70/30) (70-30) 100 UNIT/ML injection Inject into the skin.    Marland Kitchen lisinopril (PRINIVIL,ZESTRIL) 10 MG tablet Take 1 tablet (10 mg total) by mouth daily. 30 tablet 6  . NITROSTAT 0.4 MG SL tablet Place 0.4 mg under the tongue every 5 (five) minutes as needed for chest pain (MAX 3 TABLETS).     . NON FORMULARY Needles, syringes with needles, glucose reagent test strips    . Omega-3 Fatty Acids (FISH OIL PO) Take 2 tablets by mouth 2 (two) times daily.     Marland Kitchen omeprazole (PRILOSEC) 20 MG capsule Take 40 mg by mouth.    . Saw Palmetto, Serenoa repens, (SAW PALMETTO PO) Take 1 tablet by mouth daily.     . sertraline (ZOLOFT) 50 MG tablet Take 50 mg by mouth daily.     . simvastatin (ZOCOR) 80 MG tablet Take 80 mg by mouth at bedtime.      Marland Kitchen VITAMIN E PO Take 1 tablet by mouth daily.      No current facility-administered medications for this visit.     Allergies:   Adhesive [tape]    Social  History:  The patient  reports that he has quit smoking. He does not have any smokeless tobacco history on file. He reports that he does not drink alcohol or use drugs.   Family History:  The patient's family history includes Heart attack in his brother and father; Heart disease in his sister; Heart failure in his mother.    ROS:  Please see the history of present illness. All other systems are reviewed and negative.    PHYSICAL EXAM: VS:  BP (!) 147/74   Pulse (!) 55   Ht 5\' 11"  (1.803 m)  , BMI There is no height or weight on file to calculate BMI. GEN: Well nourished, well developed, male in no acute distress  HEENT: normal for age  Neck: no JVD, mildly positive hepatojugular reflux, no carotid bruit, no masses Cardiac: RRR; soft murmur, no rubs, or gallops Respiratory: rales L base, normal work of breathing GI: soft, nontender, nondistended, + BS; abd protuberant MS: no deformity or atrophy; trace lower extremity edema; distal pulses are 2+ in all 4 extremities   Skin: warm and dry, no rash Neuro:  Strength and sensation are intact Psych: euthymic mood, full affect   EKG:  EKG is ordered today. The ECG demonstrates sinus bradycardia, heart rate 55 bpm, left bundle branch block is noted  Device interrogation: His operative all tracing has returned to baseline. He is to be ventricular sensing 99.8% of the time and ventricular pacing 0.2% of the time. There have been no episodes of VT/VF since last interrogation.  Recent Labs: 09/15/2015: Brain Natriuretic Peptide 98.3; BUN 24; Creat 1.28; Potassium 4.2; Sodium 139    Lipid Panel No results found for: CHOL, TRIG, HDL, CHOLHDL, VLDL, LDLCALC, LDLDIRECT   Wt Readings from Last 3 Encounters:  04/15/15 202 lb 9.6 oz (91.9 kg)  03/15/15 202 lb 3.2 oz (91.7 kg)  03/02/15 201 lb (91.2 kg)     Other studies Reviewed: Additional studies/ records that were reviewed today include: Hospital records, office notes and  testing.  ASSESSMENT AND PLAN:  1.  Chronic systolic CHF: His weight has not changed significantly. His compliance with dietary restrictions and medications is good. His weight is up about 3 pounds on his home scales. It is about the same on hours, I feel that he has lost some weight  and put on a small amount of fluid. The Optivol was reviewed with Dr. Sallyanne Kuster, it has normalized.  He has some very mild volume overload by exam, so we will get him to take Lasix 40 mg tomorrow instead of 20 mg. Continue to track weights and otherwise continue current dosing.  2. Atrial fibrillation: This was a new diagnosis by Dr. Rayann Bolton in April. Dr. Jackalyn Lombard note reflect his elevated CHA2DS2VASc score of 7, Eliquis planned. The patient and his wife state that they never got the prescription and they ask about this today. We will start Eliquis today. We will stop his baby aspirin since he is being started on full anticoagulation. He is not having any palpitations or other symptoms. He is on carvedilol and is tolerating this well, no dose increased because of resting bradycardia. He is to continue amiodarone as well.  3. Anticoagulation: CHA2DS2VASc score of 7, add Eliquis.   4. CAD: He is having no ongoing ischemic symptoms. He is on good medical therapy with statin, beta blocker and ACE inhibitor. We will clarify his statin. Lipids are followed by his primary care physician.  5. Hypertension: His blood pressure is up a bit in the office today, but the patient states his systolic blood pressure normally runs in the 120s at home. No med changes   Current medicines are reviewed at length with the patient today.  The patient does not have concerns regarding medicines.  The following changes have been made:  Add Eliquis, DC aspirin, clarify statin  Labs/ tests ordered today include:  No orders of the defined types were placed in this encounter.    Disposition:   FU with Dr. Martinique and Dr.  Rayann Bolton  Signed, Nathaniel Bolton, Suanne Marker, PA-C  09/19/2015 2:13 PM    [facility] Phone: [phone]; Fax: [phone]  This note was written with the assistance of speech recognition software. Please excuse any transcriptional errors.

## 2015-09-19 NOTE — Telephone Encounter (Signed)
-----   Message from Rogelia Mire, NP sent at 09/19/2015  8:27 AM EDT ----- Regarding: CHF follow-up Orie Fisherman,  Would you mind reaching out to [name] and see if he could be seen this week.  He is scheduled to see me late next week but it sounds like his ICD tracings are showing that he is putting on volume now.  It looks like Suanne Marker has a 2pm slot today.  Thanks,  Gerald Stabs ----- Message ----- From: Thompson Grayer, MD Sent: 09/19/2015   7:57 AM To: Rogelia Mire, NP, Peter M Martinique, MD, #  ICM tracing is worrisome for worsening CHF.   Has appointment with Angelica Ran 9/21.  ----- Message ----- From: [name], RN Sent: 09/09/2015  11:14 AM To: Thompson Grayer, MD

## 2015-09-19 NOTE — Patient Instructions (Addendum)
Medications:  STOP Aspirin 81 mg   START Eliquis 5 mg (1 tab) twice daily.  OK to take 1 extra Furosemide tablet tomorrow.  ---You can take 1 extra tablet daily as needed for weight gain or edema.   Follow-Up:  Please call to schedule your follow-up appointment with Dr. Rayann Heman. Should be scheduled in April 2018.  Your physician wants you to follow-up in: 6 months with Dr. Martinique. You will receive a reminder letter in the mail two months in advance. If you don't receive a letter, please call our office to schedule the follow-up appointment.  If you need a refill on your cardiac medications before your next appointment, please call your pharmacy.

## 2015-09-19 NOTE — Telephone Encounter (Signed)
As requested, patient scheduled for 2pm today w/ Rhonda Barrett to address CHF concerns. I have notified wife who acknowledge appt time/location/provider information and confirmed patient's availability for visit today.

## 2015-09-19 NOTE — Telephone Encounter (Signed)
F/u message  Pt wife requesting RN call. Please call back to discuss

## 2015-09-20 DIAGNOSIS — E1142 Type 2 diabetes mellitus with diabetic polyneuropathy: Secondary | ICD-10-CM | POA: Diagnosis not present

## 2015-09-20 DIAGNOSIS — L97521 Non-pressure chronic ulcer of other part of left foot limited to breakdown of skin: Secondary | ICD-10-CM | POA: Diagnosis not present

## 2015-09-21 NOTE — Progress Notes (Signed)
Message  Received: 2 days ago  Message Contents  Thompson Grayer, MD  [name], RN; Peter M Martinique, MD  Cc: Rogelia Mire, NP        ICM tracing is worrisome for worsening CHF.  Has appointment with C Sharolyn Douglas 9/21.

## 2015-09-26 ENCOUNTER — Encounter: Payer: Self-pay | Admitting: Nurse Practitioner

## 2015-09-28 ENCOUNTER — Telehealth: Payer: Self-pay

## 2015-09-28 NOTE — Telephone Encounter (Signed)
Call to wife for ICM remote transmission which was scheduled day before office visit.  She stated his office visit got moved up and saw the physician last week.  Advised would reschedule his next remote transmission for 10/19/2015.  She stated that would be fine and he is doing ok at this time.  Encouraged to call for any fluid symptoms and confirmed she has ICM number.

## 2015-09-29 ENCOUNTER — Ambulatory Visit: Payer: Commercial Managed Care - HMO | Admitting: Nurse Practitioner

## 2015-10-12 ENCOUNTER — Encounter: Payer: Self-pay | Admitting: Nurse Practitioner

## 2015-10-12 DIAGNOSIS — E211 Secondary hyperparathyroidism, not elsewhere classified: Secondary | ICD-10-CM | POA: Diagnosis not present

## 2015-10-12 DIAGNOSIS — E559 Vitamin D deficiency, unspecified: Secondary | ICD-10-CM | POA: Diagnosis not present

## 2015-10-12 DIAGNOSIS — D649 Anemia, unspecified: Secondary | ICD-10-CM | POA: Diagnosis not present

## 2015-10-12 DIAGNOSIS — N250 Renal osteodystrophy: Secondary | ICD-10-CM | POA: Diagnosis not present

## 2015-10-12 DIAGNOSIS — N189 Chronic kidney disease, unspecified: Secondary | ICD-10-CM | POA: Diagnosis not present

## 2015-10-12 DIAGNOSIS — E039 Hypothyroidism, unspecified: Secondary | ICD-10-CM | POA: Diagnosis not present

## 2015-10-19 ENCOUNTER — Telehealth: Payer: Self-pay | Admitting: Cardiology

## 2015-10-19 ENCOUNTER — Ambulatory Visit (INDEPENDENT_AMBULATORY_CARE_PROVIDER_SITE_OTHER): Payer: Commercial Managed Care - HMO

## 2015-10-19 DIAGNOSIS — I5022 Chronic systolic (congestive) heart failure: Secondary | ICD-10-CM

## 2015-10-19 DIAGNOSIS — Z95810 Presence of automatic (implantable) cardiac defibrillator: Secondary | ICD-10-CM

## 2015-10-19 NOTE — Progress Notes (Signed)
Obtained labs from nephrologist office and instructions with labs said patient should increase Lasix to 20 mg 1 tablet a day.    Call back to wife and explained the instructions from Nephrologist office state the Lasix should be increase to 20 mg a day instead of the 40 mg she told me.   Explained I would contact the nephrologist office for clarification of Lasix dosage.  She confirmed she has 40 mg tablets and patient has been taking 1/2 tablet = 20 mg daily.

## 2015-10-19 NOTE — Progress Notes (Signed)
EPIC Encounter for ICM Monitoring  Patient Name: Nathaniel Bolton is a 75 y.o. male Date: 10/19/2015 Primary Care Physican: Laverna Peace, NP Primary [place] Electrophysiologist: Allred Nephrologist: Dr Gifford Shave  [phone] Dry Weight: 182 lb        Spoke with wife. Heart Failure questions reviewed, pt asymptomatic   Thoracic impedance abnormal suggesting fluid accumulation.  Wife reported Nephrologist has increased Furosemide to 40 mg daily and added Vitamin D3 2000 mg daily after results of lab that was completed at that office this past week.  Labs: 10/12/2015 Creatinine 1.37, BUN 26, Potassium 4.7, Sodium 143 09/15/2015 Creatinine 1.28, BUN 24, Potassium 4.2, Sodium 139 08/29/2015 Creatinine 1.31, BUN 19, Potassium 4.5, Sodium 141, GFR 54-62 - obtained from [name]'s office  08/05/2015 Creatinine 1.61, BUN 24, Potassium 4.5, Sodium 139, GFR 42-48 07/24/2015 Creatinine 3.31, BUN 45, Potassium 4.7, Sodium 139, GFR 18-22 07/23/2015 Creatinine 4.05, BUN 54, Potassium 4.2, Sodium 140, GFR 15-18 07/22/2015 Creatinine 4.87, BUN 58, Potassium 4.2, Sodium 136, GFR 12-14 07/21/2015 Creatinine 4.58, BUN 52, Potassium 4.4, Sodium 133, GFR 13-15 07/20/2015 Creatinine 3.45, BUN 42, Potassium 3.9, Sodium 135, GFR 18-21  07/19/2015 Creatinine 1.71, BUN 28, Potassium 3.9, Sodium 138, GFR 39-48 07/18/2015 Creatinine 1.44, BUN 26, Potassium 3.9, Sodium 140, GFR 48-58 07/17/2015 Creatinine 1.28, BUN 23, Potassium 3.5, Sodium 141, GFR 55->60  07/16/2015 Creatinine 1.35, BUN 24, Potassium 3.9, Sodium 137, GFR 55->60 07/15/2015 Creatinine 1.13, BUN 25, Potassium 3.5, Sodium 141, GFR >60 03/02/2015 Creatinine 1.58, BUN 34, Potassium 4.5, Sodium135 11/22/2014 Creatinine 1.62, BUN 44, Potassium 4.7, Sodium137 10/14/2014 Creatinine 1.39, BUN 35, Potassium, 5.2, Sodium135 03/22/2014 Creatinine 1.56 , BUN 33, Potassium 4.5, Sodium135   Recommendations:  Nephrologist has made  recommendation.    Follow-up plan: ICM clinic phone appointment on 10/21/2015.  Copy of ICM check sent to primary cardiologist and device physician.   ICM trend: 10/19/2015       [name], RN 10/19/2015 2:16 PM

## 2015-10-19 NOTE — Progress Notes (Signed)
Call back to patient's wife and explained Nephrologist office did not know patient has 40 mg tablet and already taking 20 mg.  Explained nephrologist recommended he stay on the 20 mg daily.   Advised dosage can be increased to 40 mg 1 tablet a day for 2-3 days if patient is overloaded.  Patient has taken 40 mg yesterday and today which correlates with the impedance returning toward baseline today.  Advised he should take 40 mg tomorrow and then return to 20 mg tablet once a day on 10/21/2015 and this should be his permanent dosage.   Will recheck fluid levels 10/21/2015 and to follow low salt diet.  Wife unaware of the hidden salt in foods and advised to check food labels for content.  He has been eating sandwiches with deli meat.    Wife verbalized understanding of Lasix dosage.

## 2015-10-19 NOTE — Progress Notes (Signed)
Call to Dr Adegoroye's office (Nephrologist) and spoke with his nurse.  Explained my role and patient has been on Lasix 20 mg daily and the wife understood that the dosage should be increased to 40 mg daily. Nurse, Amy, discussed with physician and advised patient should be only be taking Lasix 20 mg daily.  She stated it can be adjusted to 40 mg for a few days if patient becomes fluid overloaded.  Advised I would call wife and tell her patient should remain on 20 mg a day.

## 2015-10-19 NOTE — Telephone Encounter (Signed)
Confirmed remote transmission w/ pt wife.   

## 2015-10-20 DIAGNOSIS — L97521 Non-pressure chronic ulcer of other part of left foot limited to breakdown of skin: Secondary | ICD-10-CM | POA: Diagnosis not present

## 2015-10-20 DIAGNOSIS — E1142 Type 2 diabetes mellitus with diabetic polyneuropathy: Secondary | ICD-10-CM | POA: Diagnosis not present

## 2015-10-21 ENCOUNTER — Ambulatory Visit (INDEPENDENT_AMBULATORY_CARE_PROVIDER_SITE_OTHER): Payer: Commercial Managed Care - HMO

## 2015-10-21 ENCOUNTER — Telehealth: Payer: Self-pay | Admitting: Cardiology

## 2015-10-21 DIAGNOSIS — I5022 Chronic systolic (congestive) heart failure: Secondary | ICD-10-CM

## 2015-10-21 DIAGNOSIS — Z95810 Presence of automatic (implantable) cardiac defibrillator: Secondary | ICD-10-CM

## 2015-10-21 NOTE — Telephone Encounter (Signed)
Confirmed remote transmission w/ pt wife.   

## 2015-10-21 NOTE — Progress Notes (Signed)
EPIC Encounter for ICM Monitoring  Patient Name: Nathaniel Bolton is a 75 y.o. male Date: 10/21/2015 Primary Care Physican: Laverna Peace, NP Primary [place] Electrophysiologist: Allred Nephrologist: Dr Gifford Shave  [phone] Dry Weight:    182 lb        Spoke with wife.  Heart Failure questions reviewed, pt had weight gain of 3 pounds 2 days ago but has decreased by 2 lbs  Thoracic impedance showed no change after taking 3 days of Furosemide 40 mg and continues to be abnormal suggesting fluid accumulation. Dosage has returned to Furosemide 20 mg as instructed by Nephrologist.     Labs: 10/12/2015 Creatinine 1.37, BUN 26, Potassium 4.7, Sodium 143 09/15/2015 Creatinine 1.28, BUN 24, Potassium 4.2, Sodium 139 08/29/2015 Creatinine 1.31, BUN 19, Potassium 4.5, Sodium 141, GFR 54-62 - obtained from [name]'s office  08/05/2015 Creatinine 1.61, BUN 24, Potassium 4.5, Sodium 139, GFR 42-48 07/24/2015 Creatinine 3.31, BUN 45, Potassium 4.7, Sodium 139, GFR 18-22 07/23/2015 Creatinine 4.05, BUN 54, Potassium 4.2, Sodium 140, GFR 15-18 07/22/2015 Creatinine 4.87, BUN 58, Potassium 4.2, Sodium 136, GFR 12-14 07/21/2015 Creatinine 4.58, BUN 52, Potassium 4.4, Sodium 133, GFR 13-15 07/20/2015 Creatinine 3.45, BUN 42, Potassium 3.9, Sodium 135, GFR 18-21  07/19/2015 Creatinine 1.71, BUN 28, Potassium 3.9, Sodium 138, GFR 39-48 07/18/2015 Creatinine 1.44, BUN 26, Potassium 3.9, Sodium 140, GFR 48-58 07/17/2015 Creatinine 1.28, BUN 23, Potassium 3.5, Sodium 141, GFR 55->60  07/16/2015 Creatinine 1.35, BUN 24, Potassium 3.9, Sodium 137, GFR 55->60 07/15/2015 Creatinine 1.13, BUN 25, Potassium 3.5, Sodium 141, GFR >60 03/02/2015 Creatinine 1.58, BUN 34, Potassium 4.5, Sodium135 11/22/2014 Creatinine 1.62, BUN 44, Potassium 4.7, Sodium137 10/14/2014 Creatinine 1.39, BUN 35, Potassium, 5.2, Sodium135 03/22/2014 Creatinine 1.56 , BUN 33, Potassium 4.5,  Sodium135   Recommendations:  Continue to monitor. Advised to limit salt intake to 2000 mg daily.  Encouraged to call for any additional weight gain or other fluid symptoms.    Follow-up plan: ICM clinic phone appointment on 10/26/2015 to recheck fluid levels.  Copy of ICM check sent to primary cardiologist and device physician for review and will call back for any recommendations.   ICM trend: 10/21/2015       [name], RN 10/21/2015 1:37 PM

## 2015-10-26 ENCOUNTER — Telehealth: Payer: Self-pay | Admitting: Cardiology

## 2015-10-26 ENCOUNTER — Telehealth: Payer: Self-pay

## 2015-10-26 ENCOUNTER — Ambulatory Visit (INDEPENDENT_AMBULATORY_CARE_PROVIDER_SITE_OTHER): Payer: Commercial Managed Care - HMO

## 2015-10-26 DIAGNOSIS — I5022 Chronic systolic (congestive) heart failure: Secondary | ICD-10-CM

## 2015-10-26 DIAGNOSIS — Z95810 Presence of automatic (implantable) cardiac defibrillator: Secondary | ICD-10-CM

## 2015-10-26 NOTE — Telephone Encounter (Signed)
LMOVM reminding pt to send remote transmission.   

## 2015-10-26 NOTE — Progress Notes (Signed)
Call to patients wife.  Advised I would call her tomorrow with any recommendations or an appointment if needed.  Advised if patient's condition worsens to go to the ER if needed.

## 2015-10-26 NOTE — Progress Notes (Signed)
Wife returned call.  Reviewed transmission and advised the fluid accumulation appears to have worsened since transmission on 10/21/2015.  She reported he is symptomatic because he had 3 lb weight gain, increase in shortness of breath and fatigue/tiredness have worsened.    Advised will check if Dr Martinique has recommendation or if appointment with NP/PA will be needed.    Increasing  Furosemide 40 mg to 1 whole tablet from  10/10 to 10/13 did not improve Optivol and he returned to prescribed dosage of Furosemide 40 mg 1/2 tablet daily.  Worsening of Optivol and now patient is symptomatic after resuming prescribed dosage of Furosemide 40 mg 1/2 tablet (20 mg total) daily.   Patient was asymptomatic on 10/10 and is now symptomatic today.

## 2015-10-26 NOTE — Progress Notes (Signed)
EPIC Encounter for ICM Monitoring  Patient Name: Nathaniel Bolton is a 75 y.o. male Date: 10/26/2015 Primary Care Physican: Laverna Peace, NP Primary [place] Electrophysiologist: Allred Nephrologist: Dr Gifford Shave [phone] Dry Weight:unknown        Attempted ICM call and unable to reach. Transmission reviewed.   Thoracic impedance continues to be abnormal suggesting fluid accumulation even after increase of Furosemide to 40 mg x 3 days on 10/18/2015 to 10/21/2015.  Labs: 10/12/2015 Creatinine 1.37, BUN 26, Potassium 4.7, Sodium 143 09/15/2015 Creatinine 1.28, BUN 24, Potassium 4.2, Sodium 139 08/21/2017Creatinine 1.31, BUN 19, Potassium 4.5, Sodium 141, GFR 54-62 - obtained from [name]'s office  08/05/2015 Creatinine 1.61, BUN 24, Potassium 4.5, Sodium 139, GFR 42-48 07/24/2015 Creatinine 3.31, BUN 45, Potassium 4.7, Sodium 139, GFR 18-22 07/23/2015 Creatinine 4.05, BUN 54, Potassium 4.2, Sodium 140, GFR 15-18 07/22/2015 Creatinine 4.87, BUN 58, Potassium 4.2, Sodium 136, GFR 12-14 07/21/2015 Creatinine 4.58, BUN 52, Potassium 4.4, Sodium 133, GFR 13-15 07/20/2015 Creatinine 3.45, BUN 42, Potassium 3.9, Sodium 135, GFR 18-21  07/19/2015 Creatinine 1.71, BUN 28, Potassium 3.9, Sodium 138, GFR 39-48 07/18/2015 Creatinine 1.44, BUN 26, Potassium 3.9, Sodium 140, GFR 48-58 07/17/2015 Creatinine 1.28, BUN 23, Potassium 3.5, Sodium 141, GFR 55->60  07/16/2015 Creatinine 1.35, BUN 24, Potassium 3.9, Sodium 137, GFR 55->60 07/15/2015 Creatinine 1.13, BUN 25, Potassium 3.5, Sodium 141, GFR >60 03/02/2015 Creatinine 1.58, BUN 34, Potassium 4.5, Sodium135 11/22/2014 Creatinine 1.62, BUN 44, Potassium 4.7, Sodium137 10/14/2014 Creatinine 1.39, BUN 35, Potassium, 5.2, Sodium135 03/22/2014 Creatinine 1.56 , BUN 33, Potassium 4.5, Sodium135  Recommendations:   None today due to unable to reach wife.    Follow-up plan: ICM clinic phone appointment on 11/02/2015 to  recheck fluid levels.  Copy of ICM check sent to Dr Martinique and Dr Rayann Heman.   ICM trend: 10/26/2015       [name], RN 10/26/2015 12:52 PM

## 2015-10-26 NOTE — Progress Notes (Signed)
Call to scheduler and appointment is open for Dr Martinique on 10/20/2017at 10:45 am.    Call to wife and advised Dr Martinique has appointment at [facility] on 10/20/2017at 10:45 am and she agreed to appointment.

## 2015-10-26 NOTE — Telephone Encounter (Signed)
Remote ICM transmission received.  Attempted patient call and left message to return call.   

## 2015-10-27 ENCOUNTER — Encounter: Payer: Self-pay | Admitting: Cardiology

## 2015-10-27 NOTE — Progress Notes (Signed)
Received: Yesterday  Message Contents  Nathaniel M Martinique, MD  [name], RN        Yes he should see an extender in the next couple of days. He should increase lasix to 40 mg bid until seen.   Nathaniel Martinique MD, [facility]    Thompson Grayer, MD  [name], RN        If he is symptomatic, he should probably go ahead and get in to see cardiology APP.  IF asymptomatic, ok to repeat 10/25    Call to wife and advised Dr Bolton recommended he take Furosemide 40 mg today and tomorrow and she verbalized understanding.  Advised Dr Bolton will give further recommendations tomorrow at the appointment with him.

## 2015-10-27 NOTE — Progress Notes (Signed)
Nathaniel Bolton Date of Birth: [birth year] Medical Record #[id number]  History of Present Illness: Nathaniel Bolton is seen back today for followup of congestive heart failure.  He has a history of congestive heart failure with ejection fraction of 27%. He also has a history of coronary disease with remote coronary bypass surgery. He has a history of ventricular tachycardia and is s/p ICD implant.  In August 2016 he had a sustained episode of VT at 175 bpm. This did not exceed his rate limit and this was later adjusted. No recurrence. He also has a history of DM, CKD III, HTN, HLD, ED, OLD, atrial fib (CHA2DS2-VASc=7, DM, HTN, CAD, CHF, age x 1, CVA x 2) on Eliquis, CABG, VT, MDT ICD, ICM w/ EF 27% by MV 2012 (25-30% by echo 2012).  He was seen last month with some increase in volume overload primarily by Optivol measurement. His diuretic was increased to 40 mg daily for a few days. More recently again developed increase volume overload by Optivol. He did note some increased dyspnea but denies increased edema or abdominal girth. Optivol consistent with fluid retention. He is seen today for management. By his scales weight has been stable. By our scales he has gained 2-3 lbs. He denies any chest pain. Notes his nephrologist stopped lisinopril in July.    Current Outpatient Prescriptions on File Prior to Visit  Medication Sig Dispense Refill  . allopurinol (ZYLOPRIM) 100 MG tablet Take 100 mg by mouth.    Marland Kitchen amiodarone (PACERONE) 200 MG tablet Take 200 mg by mouth.    Marland Kitchen apixaban (ELIQUIS) 5 MG TABS tablet Take 1 tablet (5 mg total) by mouth 2 (two) times daily. 60 tablet 0  . Ascorbic Acid (VITAMIN C) 1000 MG tablet Take 1,000 mg by mouth daily.    Marland Kitchen atorvastatin (LIPITOR) 40 MG tablet Take 40 mg by mouth.    . carvedilol (COREG) 12.5 MG tablet Take 6.25 mg by mouth.    Marland Kitchen glipiZIDE (GLUCOTROL) 5 MG tablet Take 2.5 mg by mouth.    . insulin aspart protamine- aspart (NOVOLOG MIX 70/30) (70-30) 100  UNIT/ML injection Inject into the skin.    Marland Kitchen NITROSTAT 0.4 MG SL tablet Place 0.4 mg under the tongue every 5 (five) minutes as needed for chest pain (MAX 3 TABLETS).     . NON FORMULARY Needles, syringes with needles, glucose reagent test strips    . Omega-3 Fatty Acids (FISH OIL PO) Take 2 tablets by mouth 2 (two) times daily.     Marland Kitchen omeprazole (PRILOSEC) 20 MG capsule Take 40 mg by mouth.    . Saw Palmetto, Serenoa repens, (SAW PALMETTO PO) Take 1 tablet by mouth daily.     . sertraline (ZOLOFT) 50 MG tablet Take 50 mg by mouth daily.     Marland Kitchen VITAMIN E PO Take 1 tablet by mouth daily.      No current facility-administered medications on file prior to visit.     Allergies  Allergen Reactions  . Adhesive [Tape] Itching and Rash    Testosterone patch adhesive    Past Medical History:  Diagnosis Date  . CAD (coronary artery disease)   . CKD stage 3 due to type 2 diabetes mellitus ([place])   . Diabetes mellitus    Type 2  . Erectile dysfunction   . Gout   . Hyperlipidemia   . Hypertension   . ICD (implantable cardiac defibrillator) in place   . Myocardial infarction, old  x2 in [place]. S/P CABG 2003, Dr Roxy Manns  . Obstructive lung disease (HCC)    Moderate per prior PFT's  . Paroxysmal atrial fibrillation ([place]) 3/17   discovered on event monitor  . Stroke [facility])    2013  . Systolic CHF, acute on chronic (HCC)    EF is 27% per myoview and echo October 2012  . Ventricular tachycardia ([place]) 08/2014   175 bpm    Past Surgical History:  Procedure Laterality Date  . BYPASS GRAFT     x5  . CARDIAC DEFIBRILLATOR PLACEMENT  2008   [facility] by Dr Elonda Husky [facility]  . cataract surgery    . CHOLECYSTECTOMY    . KNEE ARTHROSCOPY     right  . UMBILICAL HERNIA REPAIR      History  Smoking Status  . Former Smoker  Smokeless Tobacco  . Never Used    History  Alcohol Use No    Family History  Problem Relation Age of Onset  . Heart failure Mother   . Heart  attack Father   . Heart disease Sister     valve replaced  . Heart attack Brother     CABG, aortic grafting    Review of Systems: The review of systems is as noted in history of present illness. All other systems were reviewed and are negative.  Physical Exam: BP 138/70 (BP Location: Left Arm, Patient Position: Sitting, Cuff Size: Normal)   Pulse (!) 52   Ht 5\' 11"  (1.803 m)   Wt 188 lb 4.8 oz (85.4 kg)   SpO2 97%   BMI 26.26 kg/m  Patient is very pleasant and in no acute distress. Skin is warm and dry. Color is normal.   Normocephalic/atraumatic. PERRL. Sclera are nonicteric. Neck is supple. No masses. Mild JVD. Lungs are clear. Cardiac exam shows a regular rate and rhythm. There is no gallop or murmur. Abdomen is soft, obese, and nontender. Extremities reveal tr pretibial edema. Gait and ROM are intact. No gross neurologic deficits noted.   LABORATORY DATA:  Lab Results  Component Value Date   GLUCOSE 100 (H) 09/15/2015   NA 139 09/15/2015   K 4.2 09/15/2015   CL 103 09/15/2015   CREATININE 1.28 (H) 09/15/2015   BUN 24 09/15/2015   CO2 27 09/15/2015   Labs from [facility] nephrologist: dated 10/13/15: Hgb 12.7. BUN 26, creatinine 1.37, other chemistries normal.   BNP 09/15/15: 98.3  Assessment / Plan: 1. Acute on Chronic systolic CHF. Continue carvedilol. Not a candidate for aldactone due to renal issues. Will increase lasix dose to 40 mg daily.  Stressed importance of sodium restriction. [facility] but call never returned.   I wonder if increased Optivol measurements over the past 2 months are related to stopping his ACEi. We will have Pharm D to see to initiate Entresto and monitor renal function closely.  2.  Status post CVA.  3. Coronary disease with remote myocardial infarctions in 1980 and 1990. Status post CABG. Patient is without anginal symptoms. Myoview study in October of 2012 showed evidence of an old  infarct without ischemia. He is asymptomatic.  4. Status post ICD implant. ICD check Mar 02, 1015 was satisfactory. History of VT in August 2016 without recurrence.   5. CKD stage 3.  6. DM  on insulin- reviewed recommendations for low Carb diet.  7. Atrial fibrillation, paroxysmal. Diagnosed on EP follow up in April.  Now on Eliquis. Continue amiodarone. Rate controlled.   8. HTN

## 2015-10-28 ENCOUNTER — Ambulatory Visit (INDEPENDENT_AMBULATORY_CARE_PROVIDER_SITE_OTHER): Payer: Commercial Managed Care - HMO | Admitting: Cardiology

## 2015-10-28 ENCOUNTER — Encounter: Payer: Self-pay | Admitting: Cardiology

## 2015-10-28 VITALS — BP 138/70 | HR 52 | Ht 71.0 in | Wt 188.3 lb

## 2015-10-28 DIAGNOSIS — I10 Essential (primary) hypertension: Secondary | ICD-10-CM

## 2015-10-28 DIAGNOSIS — E1159 Type 2 diabetes mellitus with other circulatory complications: Secondary | ICD-10-CM | POA: Insufficient documentation

## 2015-10-28 DIAGNOSIS — Z951 Presence of aortocoronary bypass graft: Secondary | ICD-10-CM

## 2015-10-28 DIAGNOSIS — I2583 Coronary atherosclerosis due to lipid rich plaque: Secondary | ICD-10-CM

## 2015-10-28 DIAGNOSIS — I480 Paroxysmal atrial fibrillation: Secondary | ICD-10-CM | POA: Diagnosis not present

## 2015-10-28 DIAGNOSIS — I2510 Atherosclerotic heart disease of native coronary artery without angina pectoris: Secondary | ICD-10-CM

## 2015-10-28 DIAGNOSIS — E782 Mixed hyperlipidemia: Secondary | ICD-10-CM | POA: Insufficient documentation

## 2015-10-28 DIAGNOSIS — I5022 Chronic systolic (congestive) heart failure: Secondary | ICD-10-CM | POA: Diagnosis not present

## 2015-10-28 DIAGNOSIS — I472 Ventricular tachycardia, unspecified: Secondary | ICD-10-CM

## 2015-10-28 DIAGNOSIS — I255 Ischemic cardiomyopathy: Secondary | ICD-10-CM

## 2015-10-28 MED ORDER — FUROSEMIDE 40 MG PO TABS: 40.0000 mg | ORAL_TABLET | Freq: Every day | ORAL | 3 refills | Status: DC

## 2015-10-28 NOTE — Patient Instructions (Signed)
Increase lasix to 40 mg daily  Continue to monitor weight and swelling  Continue sodium restriction  I will touch base with your nephrologist to see if Delene Loll is an option.

## 2015-11-02 ENCOUNTER — Ambulatory Visit (INDEPENDENT_AMBULATORY_CARE_PROVIDER_SITE_OTHER): Payer: Commercial Managed Care - HMO

## 2015-11-02 DIAGNOSIS — Z95810 Presence of automatic (implantable) cardiac defibrillator: Secondary | ICD-10-CM

## 2015-11-02 DIAGNOSIS — I5022 Chronic systolic (congestive) heart failure: Secondary | ICD-10-CM | POA: Diagnosis not present

## 2015-11-02 NOTE — Progress Notes (Signed)
Received: Today  Message Contents  Peter M Martinique, MD  [name], RN        Just check him in a month   Peter Martinique MD, [facility]   Previous Messages    ----- Message -----  From: [name], RN  Sent: 11/02/2015 12:05 PM  To: Peter M Martinique, MD, [name], LPN  Subject: Optivol fluid accumulation            Hi Dr Martinique,   Leanne Lovely is still showing some fluid accumulation (see ICM note) even after you increased Furosemide 40 mg 1 tablet daily on 10/18. He has a little stomach bloating but no other symptoms.    They have an appt tomorrow to initiate Entresto.   Any further recommendations or should I just check him in a month after he starts Entresto?   Thanks,  Sharman Cheek

## 2015-11-02 NOTE — Progress Notes (Signed)
Call to wife.  Advised Dr Martinique would like to have remote transmission scheduled for a month to check fluid levels.  Advised her to call ICM number if patient has any fluid symptoms.  No changes today.

## 2015-11-02 NOTE — Progress Notes (Signed)
EPIC Encounter for ICM Monitoring  Patient Name: Nathaniel Bolton is a 75 y.o. male Date: 11/02/2015 Primary Care Physican: Laverna Peace, NP Primary Cardiologist: Martinique Electrophysiologist: Allred Nephrologist: Dr Gifford Shave [phone] Dry Weight: 182 lb       Heart Failure questions reviewed, pt has a little stomach bloating.  Thoracic impedance abnormal suggesting fluid accumulation even after increasing Furosemide to 40 mg daily by Dr Martinique on 10/26/2015.  Labs: 10/12/2015 Creatinine 1.37, BUN 26, Potassium 4.7, Sodium 143 09/15/2015 Creatinine 1.28, BUN 24, Potassium 4.2, Sodium 139 08/21/2017Creatinine 1.31, BUN 19, Potassium 4.5, Sodium 141, GFR 54-62 - obtained from [name]'s office  08/05/2015 Creatinine 1.61, BUN 24, Potassium 4.5, Sodium 139, GFR 42-48 07/24/2015 Creatinine 3.31, BUN 45, Potassium 4.7, Sodium 139, GFR 18-22 07/23/2015 Creatinine 4.05, BUN 54, Potassium 4.2, Sodium 140, GFR 15-18 07/22/2015 Creatinine 4.87, BUN 58, Potassium 4.2, Sodium 136, GFR 12-14 07/21/2015 Creatinine 4.58, BUN 52, Potassium 4.4, Sodium 133, GFR 13-15 07/20/2015 Creatinine 3.45, BUN 42, Potassium 3.9, Sodium 135, GFR 18-21  07/19/2015 Creatinine 1.71, BUN 28, Potassium 3.9, Sodium 138, GFR 39-48 07/18/2015 Creatinine 1.44, BUN 26, Potassium 3.9, Sodium 140, GFR 48-58 07/17/2015 Creatinine 1.28, BUN 23, Potassium 3.5, Sodium 141, GFR 55->60  07/16/2015 Creatinine 1.35, BUN 24, Potassium 3.9, Sodium 137, GFR 55->60 07/15/2015 Creatinine 1.13, BUN 25, Potassium 3.5, Sodium 141, GFR >60 03/02/2015 Creatinine 1.58, BUN 34, Potassium 4.5, Sodium135 11/22/2014 Creatinine 1.62, BUN 44, Potassium 4.7, Sodium137 10/14/2014 Creatinine 1.39, BUN 35, Potassium, 5.2, Sodium135 03/22/2014 Creatinine 1.56 , BUN 33, Potassium 4.5, Sodium135   Recommendations:  Copy of ICM check sent to primary cardiologist and device physician for any recommendations.     Follow-up plan: ICM clinic  phone appointment on 12/07/2015 to recheck fluid levels.    ICM trend: 11/02/2015       [name], RN 11/02/2015 11:28 AM

## 2015-11-03 ENCOUNTER — Encounter: Payer: Self-pay | Admitting: Pharmacist Clinician (PhC)/ Clinical Pharmacy Specialist

## 2015-11-03 ENCOUNTER — Ambulatory Visit (INDEPENDENT_AMBULATORY_CARE_PROVIDER_SITE_OTHER): Payer: Commercial Managed Care - HMO | Admitting: Pharmacist Clinician (PhC)/ Clinical Pharmacy Specialist

## 2015-11-03 VITALS — BP 152/76 | HR 52 | Ht 71.0 in | Wt 181.0 lb

## 2015-11-03 DIAGNOSIS — I5022 Chronic systolic (congestive) heart failure: Secondary | ICD-10-CM

## 2015-11-03 MED ORDER — SACUBITRIL-VALSARTAN 49-51 MG PO TABS: 1.0000 | ORAL_TABLET | Freq: Two times a day (BID) | ORAL | 0 refills | Status: DC

## 2015-11-03 NOTE — Patient Instructions (Signed)
Return for a a follow up appointment in November  Your blood pressure today is 152/76  Check your blood pressure at home daily and keep record of the readings.  Take your meds as follows:  Start Entresto [phone]  Bring all of your meds, your BP cuff and your record of home blood pressures to your next appointment.  Exercise as you're able, try to walk approximately 30 minutes per day.  Keep salt intake to a minimum, especially watch canned and prepared boxed foods.  Eat more fresh fruits and vegetables and fewer canned items.  Avoid eating in fast food restaurants.    HOW TO TAKE YOUR BLOOD PRESSURE: . Rest 5 minutes before taking your blood pressure. .  Don't smoke or drink caffeinated beverages for at least 30 minutes before. . Take your blood pressure before (not after) you eat. . Sit comfortably with your back supported and both feet on the floor (don't cross your legs). . Elevate your arm to heart level on a table or a desk. . Use the proper sized cuff. It should fit smoothly and snugly around your bare upper arm. There should be enough room to slip a fingertip under the cuff. The bottom edge of the cuff should be 1 inch above the crease of the elbow. . Ideally, take 3 measurements at one sitting and record the average.

## 2015-11-03 NOTE — Progress Notes (Signed)
11/03/2015 Nathaniel Bolton [birth year] [id number]   HPI:  Nathaniel Bolton is a 75 y.o. male patient of Dr Martinique, with a PMH below who presents today for [facility] start.  His EF by MY in 2012 was 27% (25-30% by echo around the same time).  Optivol measurements recently show issues with volume overload and he has been adjusting his furosemide for a few days to compensate.  Today he reports feeling well, weight stable at 181 and no SOB or edema.    Blood Pressure Goal:  140/90  (DM)  Current Medications:  Carvedilol 6.25 mg bid (cannot increase due to bradycardia)    Family Hx:  Father died from aortic dissection, paternal grandfather from CHF; brother has had CABG x 4 as well as aneurysm.    Social Hx:  No tobacco (quit in 1992); no alcohol; drinks 1 cup decaf daily, but several cups of regular coffee on Sundays  Diet:  Has improved since last hospitalization - now smaller portions, no added sodium; only chicken, [place] and fish; frozen not canned vegetables; no fried foods  Exercise:  No regular exercise, walks grandchildren (toddlers) daily and walks 200 yds to mailbox daily  Home BP readings:   Checks daily on wrist monitor, did not bring cuff or readings;  States most home readings in past 2 weeks have been [phone] systolic.    Intolerances:   none  Wt Readings from Last 3 Encounters:  11/03/15 181 lb (82.1 kg)  10/28/15 188 lb 4.8 oz (85.4 kg)  04/15/15 202 lb 9.6 oz (91.9 kg)   BP Readings from Last 3 Encounters:  11/03/15 (!) 152/76  10/28/15 138/70  09/19/15 (!) 147/74   Pulse Readings from Last 3 Encounters:  11/03/15 (!) 52  10/28/15 (!) 52  09/19/15 (!) 55    Current Outpatient Prescriptions  Medication Sig Dispense Refill  . allopurinol (ZYLOPRIM) 100 MG tablet Take 100 mg by mouth.    Marland Kitchen amiodarone (PACERONE) 200 MG tablet Take 200 mg by mouth.    Marland Kitchen apixaban (ELIQUIS) 5 MG TABS tablet Take 1 tablet (5 mg total) by mouth 2 (two) times daily. 60  tablet 0  . Ascorbic Acid (VITAMIN C) 1000 MG tablet Take 1,000 mg by mouth daily.    Marland Kitchen atorvastatin (LIPITOR) 40 MG tablet Take 40 mg by mouth.    . carvedilol (COREG) 12.5 MG tablet Take 6.25 mg by mouth.    . Cholecalciferol (VITAMIN D3) 2000 units TABS Take 1 tablet by mouth 2 (two) times daily.    . furosemide (LASIX) 40 MG tablet Take 1 tablet (40 mg total) by mouth daily. 90 tablet 3  . glipiZIDE (GLUCOTROL) 5 MG tablet Take 2.5 mg by mouth.    . insulin aspart protamine- aspart (NOVOLOG MIX 70/30) (70-30) 100 UNIT/ML injection Inject into the skin.    Marland Kitchen NITROSTAT 0.4 MG SL tablet Place 0.4 mg under the tongue every 5 (five) minutes as needed for chest pain (MAX 3 TABLETS).     . NON FORMULARY Needles, syringes with needles, glucose reagent test strips    . Omega-3 Fatty Acids (FISH OIL PO) Take 2 tablets by mouth 2 (two) times daily.     Marland Kitchen omeprazole (PRILOSEC) 20 MG capsule Take 40 mg by mouth.    . sacubitril-valsartan (ENTRESTO) 49-51 MG Take 1 tablet by mouth 2 (two) times daily. 60 tablet 0  . Saw Palmetto, Serenoa repens, (SAW PALMETTO PO) Take 1 tablet by mouth daily.     Marland Kitchen  sertraline (ZOLOFT) 50 MG tablet Take 50 mg by mouth daily.     Marland Kitchen VITAMIN E PO Take 1 tablet by mouth daily.      No current facility-administered medications for this visit.     Allergies  Allergen Reactions  . Adhesive [Tape] Itching and Rash    Testosterone patch adhesive    Past Medical History:  Diagnosis Date  . CAD (coronary artery disease)   . CKD stage 3 due to type 2 diabetes mellitus ([place])   . Diabetes mellitus    Type 2  . Erectile dysfunction   . Gout   . Hyperlipidemia   . Hypertension   . ICD (implantable cardiac defibrillator) in place   . Myocardial infarction, old    x2 in [place]. S/P CABG 2003, Dr Roxy Manns  . Obstructive lung disease (HCC)    Moderate per prior PFT's  . Paroxysmal atrial fibrillation ([place]) 3/17   discovered on event monitor  . Stroke [facility])    2013    . Systolic CHF, acute on chronic (HCC)    EF is 27% per myoview and echo October 2012  . Ventricular tachycardia ([place]) 08/2014   175 bpm    Blood pressure (!) 152/76, pulse (!) 52, height 5\' 11"  (1.803 m), weight 181 lb (82.1 kg).  Chronic systolic heart failure Patient is started today on Entresto 49/51.  Will have him repeat BMET in 2 weeks to assess kidney function and potassium levels.  Praised dietary changes and encouraged him to continue with these.  He will bring in his home BP cuff, along with home readings and daily weights   Tommy Medal PharmD CPP [place]

## 2015-11-03 NOTE — Assessment & Plan Note (Signed)
Patient is started today on Entresto 49/51.  Will have him repeat BMET in 2 weeks to assess kidney function and potassium levels.  Praised dietary changes and encouraged him to continue with these.  He will bring in his home BP cuff, along with home readings and daily weights

## 2015-11-04 ENCOUNTER — Telehealth: Payer: Self-pay

## 2015-11-04 NOTE — Telephone Encounter (Signed)
Returned wifes call.  She reported HF clinic prescribed Entresto 49/51 on 11/03/2015 but she has tried 2 pharmacies and they will not get the medication in until next week.  Provided a sample box of Entresto 49/51 and left at the front desk for pick up.  They will pick it up today.

## 2015-11-10 DIAGNOSIS — J069 Acute upper respiratory infection, unspecified: Secondary | ICD-10-CM | POA: Diagnosis not present

## 2015-11-10 DIAGNOSIS — Z6828 Body mass index (BMI) 28.0-28.9, adult: Secondary | ICD-10-CM | POA: Diagnosis not present

## 2015-11-11 ENCOUNTER — Telehealth: Payer: Self-pay

## 2015-11-11 NOTE — Telephone Encounter (Signed)
[employer] called prior authorization started for [facility].Information taken will be reviewed and will be notified 24 to 72 hours of approval.

## 2015-11-21 ENCOUNTER — Ambulatory Visit (INDEPENDENT_AMBULATORY_CARE_PROVIDER_SITE_OTHER): Payer: Commercial Managed Care - HMO

## 2015-11-21 DIAGNOSIS — I5022 Chronic systolic (congestive) heart failure: Secondary | ICD-10-CM | POA: Diagnosis not present

## 2015-11-21 DIAGNOSIS — Z95810 Presence of automatic (implantable) cardiac defibrillator: Secondary | ICD-10-CM

## 2015-11-21 DIAGNOSIS — Z6827 Body mass index (BMI) 27.0-27.9, adult: Secondary | ICD-10-CM | POA: Diagnosis not present

## 2015-11-21 DIAGNOSIS — J208 Acute bronchitis due to other specified organisms: Secondary | ICD-10-CM | POA: Diagnosis not present

## 2015-11-21 NOTE — Progress Notes (Addendum)
EPIC Encounter for ICM Monitoring  Patient Name: Nathaniel Bolton is a 75 y.o. male Date: 11/21/2015 Primary Care Physican: Laverna Peace, NP Primary Cardiologist: Martinique Electrophysiologist: Allred Dry Weight: 189 lb        Wife called to report patient is symptomatic with stomach very tight and swollen, Bronchitis, and weight gain of approximately 3 lbs in last couple of days.  Thoracic impedance continues to be abnormal suggesting fluid accumulation.  Had office visit with Dr Martinique on 11/02/2015.  Confirmed he is taking Furosemide 40 mg 1 tablet daily.  Patient started on Entresto 11/03/2015.    Labs: Labs drawn today, 11/13 but no results yet 10/12/2015 Creatinine 1.37, BUN 26, Potassium 4.7, Sodium 143 09/15/2015 Creatinine 1.28, BUN 24, Potassium 4.2, Sodium 139 08/21/2017Creatinine 1.31, BUN 19, Potassium 4.5, Sodium 141, GFR 54-62 - obtained from [name]'s office  08/05/2015 Creatinine 1.61, BUN 24, Potassium 4.5, Sodium 139, GFR 42-48 07/24/2015 Creatinine 3.31, BUN 45, Potassium 4.7, Sodium 139, GFR 18-22 07/23/2015 Creatinine 4.05, BUN 54, Potassium 4.2, Sodium 140, GFR 15-18 07/22/2015 Creatinine 4.87, BUN 58, Potassium 4.2, Sodium 136, GFR 12-14 07/21/2015 Creatinine 4.58, BUN 52, Potassium 4.4, Sodium 133, GFR 13-15 07/20/2015 Creatinine 3.45, BUN 42, Potassium 3.9, Sodium 135, GFR 18-21  07/19/2015 Creatinine 1.71, BUN 28, Potassium 3.9, Sodium 138, GFR 39-48 07/18/2015 Creatinine 1.44, BUN 26, Potassium 3.9, Sodium 140, GFR 48-58 07/17/2015 Creatinine 1.28, BUN 23, Potassium 3.5, Sodium 141, GFR 55->60  07/16/2015 Creatinine 1.35, BUN 24, Potassium 3.9, Sodium 137, GFR 55->60 07/15/2015 Creatinine 1.13, BUN 25, Potassium 3.5, Sodium 141, GFR >60 03/02/2015 Creatinine 1.58, BUN 34, Potassium 4.5, Sodium135 11/22/2014 Creatinine 1.62, BUN 44, Potassium 4.7, Sodium137 10/14/2014 Creatinine 1.39, BUN 35, Potassium, 5.2, Sodium135 03/22/2014 Creatinine 1.56 , BUN 33,  Potassium 4.5, Sodium135  Recommendations:   Copy sent to Dr Rayann Heman and Dr Martinique for recommendations.   Follow-up plan: ICM clinic phone appointment on 12/07/2015  Copy of ICM check sent to primary cardiologist and device physician.   ICM trend: 11/21/2015       [name], RN 11/21/2015 3:56 PM

## 2015-11-21 NOTE — Progress Notes (Addendum)
See note above

## 2015-11-21 NOTE — Progress Notes (Signed)
Call back to wife.  Advised that when I receive a recommendation will call her back.  Advised to use ER if symptoms worsen.  She verbalized understanding.

## 2015-11-22 MED ORDER — FUROSEMIDE 40 MG PO TABS: 40.0000 mg | ORAL_TABLET | Freq: Two times a day (BID) | ORAL | 3 refills | Status: DC

## 2015-11-22 NOTE — Addendum Note (Signed)
Addended by: [name] on: 11/22/2015 03:40 PM   Modules accepted: Orders

## 2015-11-22 NOTE — Progress Notes (Addendum)
Call to wife and advised Dr Martinique recommended to increase Furosemide 40 mg to bid.  She verbalized understanding.  Discussed holidays foods may increase the fluid accumulation and to monitor closely.  Next ICM remote transmission 12/07/2015.  Advised her to call for any changes.  New script provided.

## 2015-11-22 NOTE — Progress Notes (Signed)
Nathaniel M Martinique, MD  [name], RN        I guess we should increase lasix to bid.   Nathaniel Martinique MD, [facility]    Nathaniel Martinique MD, [facility]

## 2015-11-28 ENCOUNTER — Encounter: Payer: Self-pay | Admitting: Pharmacist

## 2015-11-28 ENCOUNTER — Telehealth: Payer: Self-pay | Admitting: Pharmacist

## 2015-11-28 ENCOUNTER — Ambulatory Visit (INDEPENDENT_AMBULATORY_CARE_PROVIDER_SITE_OTHER): Payer: Commercial Managed Care - HMO | Admitting: Pharmacist

## 2015-11-28 VITALS — BP 138/72 | HR 54

## 2015-11-28 DIAGNOSIS — I5022 Chronic systolic (congestive) heart failure: Secondary | ICD-10-CM | POA: Diagnosis not present

## 2015-11-28 MED ORDER — FUROSEMIDE 40 MG PO TABS: 40.0000 mg | ORAL_TABLET | Freq: Two times a day (BID) | ORAL | 3 refills | Status: DC

## 2015-11-28 NOTE — Telephone Encounter (Signed)
Spoke to patient's wife and gave results from Labs on 11/21/15 at [facility]. After speaking with Dr. Martinique will decrease his lasix back to 20mg  BID and keep Entresto dose at 49/51mg  BID and repeat labs with OV in Dec.   Instructed to continue to monitor weights and call with any issues.

## 2015-11-28 NOTE — Patient Instructions (Addendum)
Return for a follow up appointment in 3-4 weeks with Nathaniel Bolton, [place]  Call [phone] with any questions or concerns.   Check your blood pressure at home daily (if able) and keep record of the readings.  Take your BP meds as follows: Pending lab results,  increase Entresto dose to 97/103mg  twice daily (you may take 2 tablets of your current supply)    Bring all of your meds, your BP cuff and your record of home blood pressures to your next appointment.  Exercise as you're able, try to walk approximately 30 minutes per day.  Keep salt intake to a minimum, especially watch canned and prepared boxed foods.  Eat more fresh fruits and vegetables and fewer canned items.  Avoid eating in fast food restaurants.    HOW TO TAKE YOUR BLOOD PRESSURE: . Rest 5 minutes before taking your blood pressure. .  Don't smoke or drink caffeinated beverages for at least 30 minutes before. . Take your blood pressure before (not after) you eat. . Sit comfortably with your back supported and both feet on the floor (don't cross your legs). . Elevate your arm to heart level on a table or a desk. . Use the proper sized cuff. It should fit smoothly and snugly around your bare upper arm. There should be enough room to slip a fingertip under the cuff. The bottom edge of the cuff should be 1 inch above the crease of the elbow. . Ideally, take 3 measurements at one sitting and record the average.

## 2015-11-28 NOTE — Progress Notes (Signed)
Patient ID: Nathaniel Bolton                 DOB: May 08, 1940                      MRN: [id number]     HPI: Nathaniel Bolton is a 75 y.o. male patient of Dr. Martinique with PMH below who presents today for medication titration.  He recently saw Nathaniel Bolton, PharmD and was started on Entresto 49/51mg  BID. He also reported some fluctuation in weight at this visit and was asked to keep a weight journal and BP log.   He reports that he is feeling well today. His weight is down to 176 lbs today on Lasix 40mg  BID (he previously was at 180lbs). He states he does occasionally lose balance but this it more related to his knees rather than blood pressure upon standing.   Pt is waiting to get Entresto covered through [place] benefits. He has an appt the first week in December for coverage. We will try to supply them samples to get through to this appt.   BMET done at [facility] in [place] last week. Will call to get these results sent to our office. - Spoke with Nathaniel Bolton at [phone] and lab results were faxed on 11/28/15.   Labs received with pertinent results as follows: 11/21/15 Scr 1.61, BUN 25, Na 143, K 4.7, Cl 104   Cardiac Hx: CAD, Htn, CHF (last EF 25-30% by echo), cardiomyopathy, Afib, DM2, hx of MI and CVA, CKD, Hld, s/p CABG  Current Medications:             Carvedilol 6.25 mg bid (cannot increase due to bradycardia)    Entresto 49/51mg  BID  Lasix 40mg  BID  Family Hx:             Father died from aortic dissection, paternal grandfather from CHF; brother has had CABG x 4 as well as aneurysm.    Social Hx:             No tobacco (quit in 1992); no alcohol; drinks 1 cup decaf daily, but several cups of regular coffee on Sundays  Diet:             Has improved since last hospitalization - now smaller portions, no added sodium; only chicken, [place] and fish; frozen not canned vegetables; no fried foods  Exercise:             No regular exercise, walks grandchildren (toddlers) daily and  walks 200 yds to mailbox daily  Home BP readings:                Checks daily on wrist monitor, did not bring cuff; Home readings measure mostly in 130s/60s.    Intolerances:              none  Wt Readings from Last 3 Encounters:  11/03/15 181 lb (82.1 kg)  10/28/15 188 lb 4.8 oz (85.4 kg)  04/15/15 202 lb 9.6 oz (91.9 kg)   BP Readings from Last 3 Encounters:  11/28/15 138/72  11/03/15 (!) 152/76  10/28/15 138/70   Pulse Readings from Last 3 Encounters:  11/28/15 (!) 54  11/03/15 (!) 52  10/28/15 (!) 52    Renal function: CrCl cannot be calculated (Patient's most recent lab result is older than the maximum 21 days allowed.).  Past Medical History:  Diagnosis Date  . CAD (coronary artery disease)   . CKD stage 3  due to type 2 diabetes mellitus ([place])   . Diabetes mellitus    Type 2  . Erectile dysfunction   . Gout   . Hyperlipidemia   . Hypertension   . ICD (implantable cardiac defibrillator) in place   . Myocardial infarction, old    x2 in [place]. S/P CABG 2003, Dr Roxy Manns  . Obstructive lung disease (HCC)    Moderate per prior PFT's  . Paroxysmal atrial fibrillation ([place]) 3/17   discovered on event monitor  . Stroke [facility])    2013  . Systolic CHF, acute on chronic (HCC)    EF is 27% per myoview and echo October 2012  . Ventricular tachycardia ([place]) 08/2014   175 bpm    Current Outpatient Prescriptions on File Prior to Visit  Medication Sig Dispense Refill  . allopurinol (ZYLOPRIM) 100 MG tablet Take 100 mg by mouth.    Marland Kitchen amiodarone (PACERONE) 200 MG tablet Take 200 mg by mouth.    Marland Kitchen apixaban (ELIQUIS) 5 MG TABS tablet Take 1 tablet (5 mg total) by mouth 2 (two) times daily. 60 tablet 0  . Ascorbic Acid (VITAMIN C) 1000 MG tablet Take 1,000 mg by mouth daily.    Marland Kitchen atorvastatin (LIPITOR) 40 MG tablet Take 40 mg by mouth.    . carvedilol (COREG) 12.5 MG tablet Take 6.25 mg by mouth.    . Cholecalciferol (VITAMIN D3) 2000 units TABS Take 1 tablet by mouth  2 (two) times daily.    Marland Kitchen glipiZIDE (GLUCOTROL) 5 MG tablet Take 2.5 mg by mouth.    . insulin aspart protamine- aspart (NOVOLOG MIX 70/30) (70-30) 100 UNIT/ML injection Inject into the skin.    . NON FORMULARY Needles, syringes with needles, glucose reagent test strips    . Omega-3 Fatty Acids (FISH OIL PO) Take 2 tablets by mouth 2 (two) times daily.     Marland Kitchen omeprazole (PRILOSEC) 20 MG capsule Take 40 mg by mouth.    . Saw Palmetto, Serenoa repens, (SAW PALMETTO PO) Take 1 tablet by mouth daily.     . sertraline (ZOLOFT) 50 MG tablet Take 50 mg by mouth daily.     Marland Kitchen VITAMIN E PO Take 1 tablet by mouth daily.     Marland Kitchen NITROSTAT 0.4 MG SL tablet Place 0.4 mg under the tongue every 5 (five) minutes as needed for chest pain (MAX 3 TABLETS).      No current facility-administered medications on file prior to visit.     Allergies  Allergen Reactions  . Adhesive [Tape] Itching and Rash    Testosterone patch adhesive    Blood pressure 138/72, pulse (!) 54, SpO2 97 %.   Assessment/Plan: CHF: BMET returned with bump in kidney function. Will decrease lasix dose back to 20mg  BID as per conversation with Dr. Martinique. Will defer titration of Entresto until after recheck BMET in 2 weeks at visit with Almyra Deforest, PA. Pt wife notified of plan (see phone note from 11/28/15). She states understanding and will call if weight trends back up. Follow up with PA as scheduled and hypertension clinic as needed for additional medication management.     Thank you, Nathaniel Bolton, [facility]  11/29/2015 9:22 PM

## 2015-11-29 ENCOUNTER — Encounter: Payer: Self-pay | Admitting: Cardiology

## 2015-12-07 ENCOUNTER — Ambulatory Visit (INDEPENDENT_AMBULATORY_CARE_PROVIDER_SITE_OTHER): Payer: Commercial Managed Care - HMO | Admitting: *Deleted

## 2015-12-07 DIAGNOSIS — Z95810 Presence of automatic (implantable) cardiac defibrillator: Secondary | ICD-10-CM

## 2015-12-07 DIAGNOSIS — I255 Ischemic cardiomyopathy: Secondary | ICD-10-CM

## 2015-12-07 DIAGNOSIS — I5022 Chronic systolic (congestive) heart failure: Secondary | ICD-10-CM

## 2015-12-07 NOTE — Progress Notes (Signed)
Remote ICD transmission.   

## 2015-12-08 NOTE — Progress Notes (Signed)
EPIC Encounter for ICM Monitoring  Patient Name: Nathaniel Bolton is a 75 y.o. male Date: 12/08/2015 Primary Care Physican: Laverna Peace, NP Primary Cardiologist: Martinique Electrophysiologist: Allred Dry Weight:    174 lb       Spoke with wife. Heart Failure questions reviewed, pt asymptomatic   Thoracic impedance normal.  Per office note on 11/20, Lasix decreased back to 20mg  BID.   Labs  11/21/2015 Creatinine 1.61, BUN 25, Potassium 4.7, Sodium 143 10/12/2015 Creatinine 1.37, BUN 26, Potassium 4.7, Sodium 143 09/15/2015 Creatinine 1.28, BUN 24, Potassium 4.2, Sodium 139 08/21/2017Creatinine 1.31, BUN 19, Potassium 4.5, Sodium 141, GFR 54-62 - obtained from [name]'s office  08/05/2015 Creatinine 1.61, BUN 24, Potassium 4.5, Sodium 139, GFR 42-48 07/24/2015 Creatinine 3.31, BUN 45, Potassium 4.7, Sodium 139, GFR 18-22 07/23/2015 Creatinine 4.05, BUN 54, Potassium 4.2, Sodium 140, GFR 15-18 07/22/2015 Creatinine 4.87, BUN 58, Potassium 4.2, Sodium 136, GFR 12-14 07/21/2015 Creatinine 4.58, BUN 52, Potassium 4.4, Sodium 133, GFR 13-15 07/20/2015 Creatinine 3.45, BUN 42, Potassium 3.9, Sodium 135, GFR 18-21  07/19/2015 Creatinine 1.71, BUN 28, Potassium 3.9, Sodium 138, GFR 39-48 07/18/2015 Creatinine 1.44, BUN 26, Potassium 3.9, Sodium 140, GFR 48-58 07/17/2015 Creatinine 1.28, BUN 23, Potassium 3.5, Sodium 141, GFR 55->60  07/16/2015 Creatinine 1.35, BUN 24, Potassium 3.9, Sodium 137, GFR 55->60 07/15/2015 Creatinine 1.13, BUN 25, Potassium 3.5, Sodium 141, GFR >60 03/02/2015 Creatinine 1.58, BUN 34, Potassium 4.5, Sodium135 11/22/2014 Creatinine 1.62, BUN 44, Potassium 4.7, Sodium137 10/14/2014 Creatinine 1.39, BUN 35, Potassium, 5.2, Sodium135 03/22/2014 Creatinine 1.56 , BUN 33, Potassium 4.5, Sodium135  Recommendations:  No changes.  Reinforced low salt food choices and limiting fluid intake to < 2 liters per day. Encouraged to call for fluid symptoms.    Follow-up plan: ICM  clinic phone appointment on 01/10/2016.  Office appointment on 12/19/2015  Copy of ICM check sent to device physician.   ICM trend: 12/07/2015       [name], RN 12/08/2015 3:35 PM

## 2015-12-16 ENCOUNTER — Encounter: Payer: Self-pay | Admitting: Cardiology

## 2015-12-19 ENCOUNTER — Ambulatory Visit (INDEPENDENT_AMBULATORY_CARE_PROVIDER_SITE_OTHER): Payer: Commercial Managed Care - HMO | Admitting: Physician Assistant

## 2015-12-19 ENCOUNTER — Encounter: Payer: Self-pay | Admitting: Physician Assistant

## 2015-12-19 VITALS — BP 139/68 | HR 54 | Ht 71.0 in | Wt 177.8 lb

## 2015-12-19 DIAGNOSIS — E785 Hyperlipidemia, unspecified: Secondary | ICD-10-CM

## 2015-12-19 DIAGNOSIS — I25810 Atherosclerosis of coronary artery bypass graft(s) without angina pectoris: Secondary | ICD-10-CM

## 2015-12-19 DIAGNOSIS — N183 Chronic kidney disease, stage 3 unspecified: Secondary | ICD-10-CM

## 2015-12-19 DIAGNOSIS — I255 Ischemic cardiomyopathy: Secondary | ICD-10-CM | POA: Diagnosis not present

## 2015-12-19 DIAGNOSIS — I480 Paroxysmal atrial fibrillation: Secondary | ICD-10-CM

## 2015-12-19 DIAGNOSIS — Z79899 Other long term (current) drug therapy: Secondary | ICD-10-CM | POA: Diagnosis not present

## 2015-12-19 DIAGNOSIS — N189 Chronic kidney disease, unspecified: Secondary | ICD-10-CM | POA: Diagnosis not present

## 2015-12-19 DIAGNOSIS — I10 Essential (primary) hypertension: Secondary | ICD-10-CM

## 2015-12-19 DIAGNOSIS — I5022 Chronic systolic (congestive) heart failure: Secondary | ICD-10-CM

## 2015-12-19 DIAGNOSIS — Z794 Long term (current) use of insulin: Secondary | ICD-10-CM

## 2015-12-19 DIAGNOSIS — Z95810 Presence of automatic (implantable) cardiac defibrillator: Secondary | ICD-10-CM

## 2015-12-19 DIAGNOSIS — E118 Type 2 diabetes mellitus with unspecified complications: Secondary | ICD-10-CM

## 2015-12-19 MED ORDER — AMIODARONE HCL 200 MG PO TABS: 200.0000 mg | ORAL_TABLET | Freq: Every day | ORAL | 3 refills | Status: DC

## 2015-12-19 MED ORDER — FUROSEMIDE 20 MG PO TABS: 20.0000 mg | ORAL_TABLET | Freq: Two times a day (BID) | ORAL | 3 refills | Status: DC

## 2015-12-19 MED ORDER — SACUBITRIL-VALSARTAN 49-51 MG PO TABS: 1.0000 | ORAL_TABLET | Freq: Two times a day (BID) | ORAL | 11 refills | Status: DC

## 2015-12-19 NOTE — Progress Notes (Signed)
Cardiology Office Note    Date:  12/19/2015   ID:  Nathaniel Bolton, DOB November 24, 1940, MRN [id number]  PCP:  Laverna Peace, NP  Cardiologist:  Dr. Martinique  Chief Complaint  Patient presents with  . Follow-up    seen for Dr. Martinique, initiation of entresto and followup on renal function    History of Present Illness:  Nathaniel Bolton is a 75 y.o. male with PMH of CAD s/p remote CABG, ICM with baseline EF 27% by [facility] 2012, h/o VT s/p MDT ICD, HTN, DM, CKD III, HLD, CVA, ED, obstructive lung disease, atrial fibrillation (CHA2DS2-VASc=7, DM, HTN, CAD, CHF, age x 1, CVA x 2) on Eliquis. In August 2016, he had sustained episode of VT at 175 bpm, this did not exceed his devices rate limit and this was later adjusted. He was seen in September for volume overload primarily by Optivol measurement, his diuretic was increased to 40 mg daily for a few days. More recently, he developed volume overload again by Optivol. He was last seen on 10/28/2015 for acute on chronic systolic heart failure, he is not a candidate for Aldactone due to renal issues. His nephrologist took him off of ACE inhibitor in July. His Lasix was permanently increased to 40 mg daily. He was set up with our clinical pharmacist to initiate [facility] therapy. Based on recent note by [facility] PharmD, due to bumps in the renal function, his Lasix was adjusted to 20 mg twice a day. Further titration of Entresto was delayed until after recheck BMET.   Patient presents today for cardiology office visit. He has not had any acute issues recently. He continued to be concerned with his kidney function. He is accompanied by his wife and 2 grandchildren. He denies any recent lower extremity edema, orthopnea or paroxysmal nocturnal dyspnea. He has been compliant with sodium and fluid restriction and daily weight. After his diuretic was decreased to 20 mg twice a day, he has been tolerating the new dose without any sign of fluid reaccumulation. Interestingly,  he has been taking twice a day dosing of amiodarone, per wife, it is 100 mg amiodarone twice a day, however I am unaware of any amiodarone coming in and 100 mg tablet. I have asked her to recheck at home. He should be taking 200 mg amiodarone daily. He is also rate controlled on carvedilol 6.25 mg twice a day. His heart rate remained bradycardic in the 50s consistent with prior office visit. He denies any recurrent palpitation like those he experienced with previous atrial fibrillation episodes. As far as Delene Loll, he is on the 49-51 mg dosage. I will continue him on the current dose. We will obtain a basic metabolic panel today, if his renal function does improve, we will consider up titrating Entresto dosage.   Past Medical History:  Diagnosis Date  . CAD (coronary artery disease)   . CKD stage 3 due to type 2 diabetes mellitus ([place])   . Diabetes mellitus    Type 2  . Erectile dysfunction   . Gout   . Hyperlipidemia   . Hypertension   . ICD (implantable cardiac defibrillator) in place   . Myocardial infarction, old    x2 in [place]. S/P CABG 2003, Dr Roxy Manns  . Obstructive lung disease (HCC)    Moderate per prior PFT's  . Paroxysmal atrial fibrillation ([place]) 3/17   discovered on event monitor  . Stroke [facility])    2013  . Systolic CHF, acute on chronic (HCC)  EF is 27% per myoview and echo October 2012  . Ventricular tachycardia ([place]) 08/2014   175 bpm    Past Surgical History:  Procedure Laterality Date  . BYPASS GRAFT     x5  . CARDIAC DEFIBRILLATOR PLACEMENT  2008   [facility] by Dr Elonda Husky at [facility]  . cataract surgery    . CHOLECYSTECTOMY    . KNEE ARTHROSCOPY     right  . UMBILICAL HERNIA REPAIR      Current Medications: Outpatient Medications Prior to Visit  Medication Sig Dispense Refill  . allopurinol (ZYLOPRIM) 100 MG tablet Take 100 mg by mouth.    Marland Kitchen apixaban (ELIQUIS) 5 MG TABS tablet Take 1 tablet (5 mg total) by mouth 2 (two) times daily. 60  tablet 0  . Ascorbic Acid (VITAMIN C) 1000 MG tablet Take 1,000 mg by mouth daily.    Marland Kitchen atorvastatin (LIPITOR) 40 MG tablet Take 40 mg by mouth daily at 6 PM.     . Cholecalciferol (VITAMIN D3) 2000 units TABS Take 1 tablet by mouth 2 (two) times daily.    Marland Kitchen glipiZIDE (GLUCOTROL) 5 MG tablet Take 2.5 mg by mouth.    . insulin aspart protamine- aspart (NOVOLOG MIX 70/30) (70-30) 100 UNIT/ML injection Inject into the skin.    Marland Kitchen NITROSTAT 0.4 MG SL tablet Place 0.4 mg under the tongue every 5 (five) minutes as needed for chest pain (MAX 3 TABLETS).     . NON FORMULARY Needles, syringes with needles, glucose reagent test strips    . Omega-3 Fatty Acids (FISH OIL PO) Take 2 tablets by mouth 2 (two) times daily.     Marland Kitchen omeprazole (PRILOSEC) 20 MG capsule Take 40 mg by mouth.    . Saw Palmetto, Serenoa repens, (SAW PALMETTO PO) Take 1 tablet by mouth daily.     . sertraline (ZOLOFT) 50 MG tablet Take 50 mg by mouth daily.     Marland Kitchen VITAMIN E PO Take 1 tablet by mouth daily.     Marland Kitchen amiodarone (PACERONE) 200 MG tablet Take 200 mg by mouth 2 (two) times daily.     . furosemide (LASIX) 40 MG tablet Take 1 tablet (40 mg total) by mouth 2 (two) times daily. 90 tablet 3  . sacubitril-valsartan (ENTRESTO) 97-103 MG Take 1 tablet by mouth 2 (two) times daily.    . carvedilol (COREG) 12.5 MG tablet Take 6.25 mg by mouth.     No facility-administered medications prior to visit.      Allergies:   Adhesive [tape]   Social History   Social History  . Marital status: Married    Spouse name: N/A  . Number of children: 4  . Years of education: N/A   Occupational History  . heavy Radio producer    Social History Main Topics  . Smoking status: Former Research scientist (life sciences)  . Smokeless tobacco: Never Used  . Alcohol use No  . Drug use: No  . Sexual activity: Not Asked   Other Topics Concern  . None   Social History Narrative  . None     Family History:  The patient's family history includes Heart attack in his  brother and father; Heart disease in his sister; Heart failure in his mother.   ROS:   Please see the history of present illness.    ROS All other systems reviewed and are negative.   PHYSICAL EXAM:   VS:  BP 139/68   Pulse (!) 54   Ht 5'  11" (1.803 m)   Wt 177 lb 12.8 oz (80.6 kg)   BMI 24.80 kg/m    GEN: Well nourished, well developed, in no acute distress  HEENT: normal  Neck: no JVD, carotid bruits, or masses Cardiac: RRR; no murmurs, rubs, or gallops,no edema  Respiratory:  clear to auscultation bilaterally, normal work of breathing GI: soft, nontender, nondistended, + BS MS: no deformity or atrophy  Skin: warm and dry, no rash Neuro:  Alert and Oriented x 3, Strength and sensation are intact Psych: euthymic mood, full affect  Wt Readings from Last 3 Encounters:  12/19/15 177 lb 12.8 oz (80.6 kg)  11/03/15 181 lb (82.1 kg)  10/28/15 188 lb 4.8 oz (85.4 kg)      Studies/Labs Reviewed:   EKG:  EKG is not ordered today.    Recent Labs: 09/15/2015: Brain Natriuretic Peptide 98.3; BUN 24; Creat 1.28; Potassium 4.2; Sodium 139   Lipid Panel No results found for: CHOL, TRIG, HDL, CHOLHDL, VLDL, LDLCALC, LDLDIRECT  Additional studies/ records that were reviewed today include:   Echo 11/02/2010 LV EF: 25% -  30%  - Left ventricle: Diffuse hypokinesis with inferior wall akinesis The cavity size was moderately dilated. Wall thickness was normal. Systolic function was severely reduced. The estimated ejection fraction was in the range of 25% to 30%. Doppler parameters are consistent with abnormal left ventricular relaxation (grade 1 diastolic dysfunction). - Left atrium: The atrium was mildly to moderately dilated. - Atrial septum: No defect or patent foramen ovale was identified.   ASSESSMENT:    1. Medication management   2. Chronic systolic heart failure ([place])   3. Coronary artery disease involving coronary bypass graft of native heart without  angina pectoris   4. Cardiomyopathy, ischemic   5. PAF (paroxysmal atrial fibrillation) ([place])   6. ICD (implantable cardioverter-defibrillator) in place   7. Essential hypertension   8. Hyperlipidemia, unspecified hyperlipidemia type   9. Controlled type 2 diabetes mellitus with complication, with long-term current use of insulin ([place])   10. CKD (chronic kidney disease), stage III      PLAN:  In order of problems listed above:  1. ICM with baseline EF 27%: Stable at this time, no sign of heart failure.  2. CAD s/p CABG: No obvious angina recently.  3. PAF on amiodarone and eliquis: : Continue on amiodarone for both CT and PAF. Unclear dosage of amiodarone, per wife, he is on 100 mg twice a day of amiodarone, however I am not familiar with amiodarone that come in in 100 mg tablet.  4. H/o VT s/p ICD: No recent ICD discharge.  5. HTN: Blood pressure well controlled on current medication.  6. HLD: On Lipitor 40 mg daily.  7. DM II: He is on insulin and glipizide.   8. Acute on CKD stage III: following initiation of Entresto, he he did have worsening renal function, his Lasix was cut back however he is tolerating the current lower dose without any sign of fluid accumulation. His diuretic was cut back to 20 mg twice a day instead. He is Delene Loll is currently on 49-51 mg. I will obtain basic metabolic panel today, if his renal function improve, we can consider up titrating Entresto dose.    Medication Adjustments/Labs and Tests Ordered: Current medicines are reviewed at length with the patient today.  Concerns regarding medicines are outlined above.  Medication changes, Labs and Tests ordered today are listed in the Patient Instructions below. Patient Instructions  CONFIRM YOU AR  TAKING AMIODARONE 200 MG ONE TABLET DAILY ENTRESTO 49/51 MG  ONE TABLET TWICE  A DAY , IF DIFFERENT CALL OFFICE BACK .   LASIX 20 MG ONE TABLET TWICE A DAY.   BMP TODAY - AT [facility]   KEEP APPOINTMENT  WITH DR Martinique FOR Mar 05 2016.   If you need a refill on your cardiac medications before your next appointment, please call your pharmacy.     Hilbert Corrigan, [place]  12/19/2015 11:14 PM    [facility] [address], [place], [place]  [address] Phone: [phone]; Fax: [phone]

## 2015-12-19 NOTE — Patient Instructions (Addendum)
CONFIRM YOU AR TAKING AMIODARONE 200 MG ONE TABLET DAILY ENTRESTO 49/51 MG  ONE TABLET TWICE  A DAY , IF DIFFERENT CALL OFFICE BACK .   LASIX 20 MG ONE TABLET TWICE A DAY.   BMP TODAY - AT [facility]   KEEP APPOINTMENT WITH DR Martinique FOR Mar 05 2016.   If you need a refill on your cardiac medications before your next appointment, please call your pharmacy.

## 2015-12-20 LAB — BASIC METABOLIC PANEL
BUN / CREAT RATIO: 18 (ref 10–24)
BUN: 24 mg/dL (ref 8–27)
CHLORIDE: 97 mmol/L (ref 96–106)
CO2: 28 mmol/L (ref 18–29)
Calcium: 8.8 mg/dL (ref 8.6–10.2)
Creatinine, Ser: 1.36 mg/dL — ABNORMAL HIGH (ref 0.76–1.27)
GFR calc Af Amer: 59 mL/min/{1.73_m2} — ABNORMAL LOW (ref >59)
GFR calc non Af Amer: 51 mL/min/{1.73_m2} — ABNORMAL LOW (ref >59)
GLUCOSE: 217 mg/dL — AB (ref 65–99)
POTASSIUM: 4.7 mmol/L (ref 3.5–5.2)
SODIUM: 138 mmol/L (ref 134–144)

## 2015-12-22 ENCOUNTER — Telehealth: Payer: Self-pay | Admitting: Physician Assistant

## 2015-12-22 DIAGNOSIS — I5022 Chronic systolic (congestive) heart failure: Secondary | ICD-10-CM

## 2015-12-22 DIAGNOSIS — Z79899 Other long term (current) drug therapy: Secondary | ICD-10-CM | POA: Insufficient documentation

## 2015-12-22 MED ORDER — SACUBITRIL-VALSARTAN 97-103 MG PO TABS: 1.0000 | ORAL_TABLET | Freq: Two times a day (BID) | ORAL | 11 refills | Status: DC

## 2015-12-22 NOTE — Telephone Encounter (Signed)
New message      Pt c/o medication issue:  1. Name of Medication: entresto 2. How are you currently taking this medication (dosage and times per day)?    3. Are you having a reaction (difficulty breathing--STAT)?  no  4. What is your medication issue?  [facility] already had a presc ready to be picked up for entresto 49-51.  This am, we called in entresto 97-103.  Which one do you want them to fill?

## 2015-12-22 NOTE — Telephone Encounter (Signed)
Spoke to wife . Instruction given. E-sent medication to pharmacy and placed lab- to [facility] to be done a week after starting the new dose 97/103 of entresto.  wife states [employer] Informed patient  Medication may not be covered through [employer] ( [place] )  RN informed Wife It may take prior authorization- will inform patient when approved if needed.wife verbalized understanding.

## 2015-12-22 NOTE — Telephone Encounter (Signed)
Returning Nathaniel Bolton's call from yesterday

## 2015-12-22 NOTE — Telephone Encounter (Signed)
Aware- changing dosage .  discontinue 49/51dose of entresto   fill new dosage [place] verbalized understanding

## 2016-01-03 DIAGNOSIS — Z79899 Other long term (current) drug therapy: Secondary | ICD-10-CM | POA: Diagnosis not present

## 2016-01-03 DIAGNOSIS — I5022 Chronic systolic (congestive) heart failure: Secondary | ICD-10-CM | POA: Diagnosis not present

## 2016-01-04 ENCOUNTER — Other Ambulatory Visit: Payer: Self-pay | Admitting: *Deleted

## 2016-01-04 DIAGNOSIS — Z79899 Other long term (current) drug therapy: Secondary | ICD-10-CM

## 2016-01-04 LAB — BASIC METABOLIC PANEL
BUN/Creatinine Ratio: 15 (ref 10–24)
BUN: 23 mg/dL (ref 8–27)
CALCIUM: 9.2 mg/dL (ref 8.6–10.2)
CO2: 25 mmol/L (ref 18–29)
CREATININE: 1.52 mg/dL — AB (ref 0.76–1.27)
Chloride: 102 mmol/L (ref 96–106)
GFR, EST AFRICAN AMERICAN: 51 mL/min/{1.73_m2} — AB (ref >59)
GFR, EST NON AFRICAN AMERICAN: 44 mL/min/{1.73_m2} — AB (ref >59)
Glucose: 159 mg/dL — ABNORMAL HIGH (ref 65–99)
POTASSIUM: 4.5 mmol/L (ref 3.5–5.2)
Sodium: 144 mmol/L (ref 134–144)

## 2016-01-10 ENCOUNTER — Telehealth: Payer: Self-pay | Admitting: Cardiology

## 2016-01-10 ENCOUNTER — Ambulatory Visit (INDEPENDENT_AMBULATORY_CARE_PROVIDER_SITE_OTHER): Payer: Medicare HMO

## 2016-01-10 DIAGNOSIS — Z95810 Presence of automatic (implantable) cardiac defibrillator: Secondary | ICD-10-CM

## 2016-01-10 DIAGNOSIS — I5022 Chronic systolic (congestive) heart failure: Secondary | ICD-10-CM

## 2016-01-10 LAB — CUP PACEART REMOTE DEVICE CHECK
Brady Statistic RV Percent Paced: 0.13 %
HIGH POWER IMPEDANCE MEASURED VALUE: 42 Ohm
HighPow Impedance: 54 Ohm
Implantable Lead Location: 753860
Implantable Lead Model: 6947
Lead Channel Impedance Value: 380 Ohm
Lead Channel Pacing Threshold Amplitude: 1 V
Lead Channel Setting Pacing Amplitude: 2.5 V
Lead Channel Setting Sensing Sensitivity: 0.3 mV
MDC IDC LEAD IMPLANT DT: 20091217
MDC IDC MSMT BATTERY VOLTAGE: 2.64 V
MDC IDC MSMT LEADCHNL RV PACING THRESHOLD PULSEWIDTH: 0.4 ms
MDC IDC MSMT LEADCHNL RV SENSING INTR AMPL: 4.875 mV
MDC IDC MSMT LEADCHNL RV SENSING INTR AMPL: 4.875 mV
MDC IDC PG IMPLANT DT: 20091217
MDC IDC SESS DTM: 20171129052208
MDC IDC SET LEADCHNL RV PACING PULSEWIDTH: 0.4 ms

## 2016-01-10 NOTE — Telephone Encounter (Signed)
LMOVM reminding pt to send remote transmission.   

## 2016-01-11 DIAGNOSIS — E1140 Type 2 diabetes mellitus with diabetic neuropathy, unspecified: Secondary | ICD-10-CM | POA: Diagnosis not present

## 2016-01-11 DIAGNOSIS — N189 Chronic kidney disease, unspecified: Secondary | ICD-10-CM | POA: Diagnosis not present

## 2016-01-11 DIAGNOSIS — E782 Mixed hyperlipidemia: Secondary | ICD-10-CM | POA: Diagnosis not present

## 2016-01-11 DIAGNOSIS — L97523 Non-pressure chronic ulcer of other part of left foot with necrosis of muscle: Secondary | ICD-10-CM | POA: Diagnosis not present

## 2016-01-11 DIAGNOSIS — E1169 Type 2 diabetes mellitus with other specified complication: Secondary | ICD-10-CM | POA: Diagnosis not present

## 2016-01-11 DIAGNOSIS — I509 Heart failure, unspecified: Secondary | ICD-10-CM | POA: Diagnosis not present

## 2016-01-11 DIAGNOSIS — I10 Essential (primary) hypertension: Secondary | ICD-10-CM | POA: Diagnosis not present

## 2016-01-13 DIAGNOSIS — M79661 Pain in right lower leg: Secondary | ICD-10-CM | POA: Diagnosis not present

## 2016-01-13 DIAGNOSIS — M79604 Pain in right leg: Secondary | ICD-10-CM | POA: Diagnosis not present

## 2016-01-13 DIAGNOSIS — I70203 Unspecified atherosclerosis of native arteries of extremities, bilateral legs: Secondary | ICD-10-CM | POA: Diagnosis not present

## 2016-01-13 DIAGNOSIS — S82831A Other fracture of upper and lower end of right fibula, initial encounter for closed fracture: Secondary | ICD-10-CM | POA: Diagnosis not present

## 2016-01-13 DIAGNOSIS — R609 Edema, unspecified: Secondary | ICD-10-CM | POA: Diagnosis not present

## 2016-01-16 ENCOUNTER — Telehealth: Payer: Self-pay

## 2016-01-16 ENCOUNTER — Ambulatory Visit (INDEPENDENT_AMBULATORY_CARE_PROVIDER_SITE_OTHER): Payer: Medicare HMO

## 2016-01-16 DIAGNOSIS — Z95810 Presence of automatic (implantable) cardiac defibrillator: Secondary | ICD-10-CM | POA: Diagnosis not present

## 2016-01-16 DIAGNOSIS — I5022 Chronic systolic (congestive) heart failure: Secondary | ICD-10-CM

## 2016-01-16 NOTE — Progress Notes (Signed)
EPIC Encounter for ICM Monitoring  Patient Name: Nathaniel Bolton is a 76 y.o. male Date: 01/16/2016 Primary Care Physican: Laverna Peace, NP Primary Cardiologist: Martinique Electrophysiologist: Allred Dry Weight: unknown       Spoke with wife after reviewing 01/10/16 transmission and advised to send updated ICM transmission today.  Heart Failure questions reviewed, pt asymptomatic.  Wife reported she has not given patient Eliquis in the last 3 weeks because she thought the Entresto replaced that medication.  Explained Entresto and Eliquis are not the same type of drug and Eliquis is to help prevent blood clots.  She stated she will restart him today on the Eliquis.  She reported he broke his leg last week from a fall.  It had been doing fine up until he woke up today with it red and swollen.  Advised there is a risk for blood clot since he has not been taking Eliquis.  Advised would check if Dr Martinique will need to see patient.    Thoracic impedance abnormal suggesting fluid accumulation for the last few days.  Impedance has been abnormal since Thanksgiving with exception of a 5 days at baseline.   Labs  11/21/2015 Creatinine 1.61, BUN 25, Potassium 4.7, Sodium 143 10/12/2015 Creatinine 1.37, BUN 26, Potassium 4.7, Sodium 143 09/15/2015 Creatinine 1.28, BUN 24, Potassium 4.2, Sodium 139 08/21/2017Creatinine 1.31, BUN 19, Potassium 4.5, Sodium 141, GFR 54-62 - obtained from [name]'s office  08/05/2015 Creatinine 1.61, BUN 24, Potassium 4.5, Sodium 139, GFR 42-48 07/24/2015 Creatinine 3.31, BUN 45, Potassium 4.7, Sodium 139, GFR 18-22 07/23/2015 Creatinine 4.05, BUN 54, Potassium 4.2, Sodium 140, GFR 15-18 07/22/2015 Creatinine 4.87, BUN 58, Potassium 4.2, Sodium 136, GFR 12-14 07/21/2015 Creatinine 4.58, BUN 52, Potassium 4.4, Sodium 133, GFR 13-15 07/20/2015 Creatinine 3.45, BUN 42, Potassium 3.9, Sodium 135, GFR 18-21  07/19/2015 Creatinine 1.71, BUN 28, Potassium 3.9, Sodium 138, GFR  39-48 07/18/2015 Creatinine 1.44, BUN 26, Potassium 3.9, Sodium 140, GFR 48-58 07/17/2015 Creatinine 1.28, BUN 23, Potassium 3.5, Sodium 141, GFR 55->60  07/16/2015 Creatinine 1.35, BUN 24, Potassium 3.9, Sodium 137, GFR 55->60 07/15/2015 Creatinine 1.13, BUN 25, Potassium 3.5, Sodium 141, GFR >60 03/02/2015 Creatinine 1.58, BUN 34, Potassium 4.5, Sodium135 11/22/2014 Creatinine 1.62, BUN 44, Potassium 4.7, Sodium137 10/14/2014 Creatinine 1.39, BUN 35, Potassium, 5.2, Sodium135 03/22/2014 Creatinine 1.56 , BUN 33, Potassium 4.5, Sodium135  Recommendations:  Spoke with Dr Doug Sou nurse, [name], RN and explained patient has not been taking Eliquis x 2-3 weeks, he broke his leg last week and woke up with it swollen and red and Optivol is showing fluid accumulation.  She will discuss with Dr Martinique and advise patient/wife of any recommendations.    Follow-up plan: ICM clinic phone appointment on 01/23/2016.  Copy of ICM check sent to primary cardiologist and device physician.   3 month ICM trend : 01/16/2016   1 Year ICM trend:      [name], RN 01/16/2016 10:38 AM

## 2016-01-16 NOTE — Telephone Encounter (Signed)
Received a call from Deniece Ree.She stated patient's optivol revealed fluid over load.Patient ran out of Eliquis and missed 2 weeks of Eliquis.Patient just restarted taking again.Stated while speaking to patient's wife patient slipped on ice last Thursday and fractured right lower leg.Patient having pain and swelling in right lower leg.Top of right foot and lower leg red warm to touch.Spoke to [place] he advised patient needs to see PCP. Advised no change continue same medications.Spoke to patient's wife advised patient needs to see PCP.

## 2016-01-16 NOTE — Progress Notes (Signed)
EPIC Encounter for ICM Monitoring  Patient Name: Nathaniel Bolton is a 76 y.o. male Date: 01/16/2016 Primary Care Physican: Laverna Peace, NP Primary [place] Electrophysiologist: Allred Dry Weight:unknown              Transmission sent in late by patient.  After transmission reviewed, call to wife and advised to send updated transmission for 01/16/2016.    Thoracic impedance abnormal suggesting fluid accumulation.  Labs  11/21/2015 Creatinine 1.61, BUN 25, Potassium 4.7, Sodium 143 10/12/2015 Creatinine 1.37, BUN 26, Potassium 4.7, Sodium 143 09/15/2015 Creatinine 1.28, BUN 24, Potassium 4.2, Sodium 139 08/21/2017Creatinine 1.31, BUN 19, Potassium 4.5, Sodium 141, GFR 54-62 - obtained from [name]'s office  08/05/2015 Creatinine 1.61, BUN 24, Potassium 4.5, Sodium 139, GFR 42-48 07/24/2015 Creatinine 3.31, BUN 45, Potassium 4.7, Sodium 139, GFR 18-22 07/23/2015 Creatinine 4.05, BUN 54, Potassium 4.2, Sodium 140, GFR 15-18 07/22/2015 Creatinine 4.87, BUN 58, Potassium 4.2, Sodium 136, GFR 12-14 07/21/2015 Creatinine 4.58, BUN 52, Potassium 4.4, Sodium 133, GFR 13-15 07/20/2015 Creatinine 3.45, BUN 42, Potassium 3.9, Sodium 135, GFR 18-21  07/19/2015 Creatinine 1.71, BUN 28, Potassium 3.9, Sodium 138, GFR 39-48 07/18/2015 Creatinine 1.44, BUN 26, Potassium 3.9, Sodium 140, GFR 48-58 07/17/2015 Creatinine 1.28, BUN 23, Potassium 3.5, Sodium 141, GFR 55->60  07/16/2015 Creatinine 1.35, BUN 24, Potassium 3.9, Sodium 137, GFR 55->60 07/15/2015 Creatinine 1.13, BUN 25, Potassium 3.5, Sodium 141, GFR >60 03/02/2015 Creatinine 1.58, BUN 34, Potassium 4.5, Sodium135 11/22/2014 Creatinine 1.62, BUN 44, Potassium 4.7, Sodium137 10/14/2014 Creatinine 1.39, BUN 35, Potassium, 5.2, Sodium135 03/22/2014 Creatinine 1.56 , BUN 33, Potassium 4.5, Sodium135  Recommendations: See ICM note 01/16/2016 for updated information.    Follow-up plan: ICM clinic phone appointment on  01/16/2016  Copy of ICM check sent to device physician.   3 month ICM trend : 01/16/2016   1 Year ICM trend:      [name], RN 01/16/2016 9:01 AM

## 2016-01-17 DIAGNOSIS — M79604 Pain in right leg: Secondary | ICD-10-CM | POA: Diagnosis not present

## 2016-01-17 DIAGNOSIS — S82831D Other fracture of upper and lower end of right fibula, subsequent encounter for closed fracture with routine healing: Secondary | ICD-10-CM | POA: Diagnosis not present

## 2016-01-17 DIAGNOSIS — S82831A Other fracture of upper and lower end of right fibula, initial encounter for closed fracture: Secondary | ICD-10-CM | POA: Diagnosis not present

## 2016-01-17 NOTE — Progress Notes (Signed)
Dr Doug Sou nurse called patient/wife and Dr.Jordan advised patient needs to see PCP. Advised no change continue same medications.Spoke to patient's wife advised patient needs to see PCP.

## 2016-01-19 ENCOUNTER — Telehealth: Payer: Self-pay

## 2016-01-19 NOTE — Telephone Encounter (Signed)
Wife stated patient visited PCP yesterday and doppler showed that groin area has a blocked artery which is new.  She reported Laverna Peace, PCP was going to fax the report to Dr Doug Sou office.  She stated the swelling in his hands have decreased and thinks he has less fluid  Advised I would send message to Dr Doug Sou office with information and if the report was received.

## 2016-01-19 NOTE — Telephone Encounter (Signed)
Attempted return call to wife as voice mail requested.  No answer and left message for return call.

## 2016-01-23 ENCOUNTER — Telehealth: Payer: Self-pay | Admitting: Cardiology

## 2016-01-23 ENCOUNTER — Ambulatory Visit (INDEPENDENT_AMBULATORY_CARE_PROVIDER_SITE_OTHER): Payer: Medicare HMO

## 2016-01-23 DIAGNOSIS — I5022 Chronic systolic (congestive) heart failure: Secondary | ICD-10-CM

## 2016-01-23 DIAGNOSIS — Z95810 Presence of automatic (implantable) cardiac defibrillator: Secondary | ICD-10-CM

## 2016-01-23 NOTE — Progress Notes (Signed)
EPIC Encounter for ICM Monitoring  Patient Name: Nathaniel Bolton is a 76 y.o. male Date: 01/23/2016 Primary Care Physican: Laverna Peace, NP Primary Cardiologist: [place] Electrophysiologist: Allred Dry Weight:    unknown      Spoke with wife.  Heart Failure questions reviewed, pt asymptomatic.  She stated he feels fine and denied any fluid symptoms.  Thoracic impedance continues to be abnormal suggesting fluid accumulation and fluid index > threshold.     Labs  11/21/2015 Creatinine 1.61, BUN 25, Potassium 4.7, Sodium 143 10/12/2015 Creatinine 1.37, BUN 26, Potassium 4.7, Sodium 143 09/15/2015 Creatinine 1.28, BUN 24, Potassium 4.2, Sodium 139 08/21/2017Creatinine 1.31, BUN 19, Potassium 4.5, Sodium 141, GFR 54-62 - obtained from [name]'s office  08/05/2015 Creatinine 1.61, BUN 24, Potassium 4.5, Sodium 139, GFR 42-48 07/24/2015 Creatinine 3.31, BUN 45, Potassium 4.7, Sodium 139, GFR 18-22 07/23/2015 Creatinine 4.05, BUN 54, Potassium 4.2, Sodium 140, GFR 15-18 07/22/2015 Creatinine 4.87, BUN 58, Potassium 4.2, Sodium 136, GFR 12-14 07/21/2015 Creatinine 4.58, BUN 52, Potassium 4.4, Sodium 133, GFR 13-15 07/20/2015 Creatinine 3.45, BUN 42, Potassium 3.9, Sodium 135, GFR 18-21  07/19/2015 Creatinine 1.71, BUN 28, Potassium 3.9, Sodium 138, GFR 39-48 07/18/2015 Creatinine 1.44, BUN 26, Potassium 3.9, Sodium 140, GFR 48-58 07/17/2015 Creatinine 1.28, BUN 23, Potassium 3.5, Sodium 141, GFR 55->60  07/16/2015 Creatinine 1.35, BUN 24, Potassium 3.9, Sodium 137, GFR 55->60 07/15/2015 Creatinine 1.13, BUN 25, Potassium 3.5, Sodium 141, GFR >60 03/02/2015 Creatinine 1.58, BUN 34, Potassium 4.5, Sodium135 11/22/2014 Creatinine 1.62, BUN 44, Potassium 4.7, Sodium137 10/14/2014 Creatinine 1.39, BUN 35, Potassium, 5.2, Sodium135 03/22/2014 Creatinine 1.56 , BUN 33, Potassium 4.5, Sodium135  Recommendations:  Copy of ICM check sent to Dr Martinique and Dr Rayann Heman for review and if any  recommendations will call her back.  Encouraged to call if he develops any fluid symptoms.  Follow-up plan: ICM clinic phone appointment on 01/30/2016 to recheck fluid levels.  Office appointment with Dr Martinique 03/05/2016    3 month ICM trend: 01/23/2016   1 Year ICM trend:      [name], RN 01/23/2016 2:11 PM

## 2016-01-23 NOTE — Telephone Encounter (Signed)
Confirmed remote transmission w/ pt wife.   

## 2016-01-24 DIAGNOSIS — L97521 Non-pressure chronic ulcer of other part of left foot limited to breakdown of skin: Secondary | ICD-10-CM | POA: Diagnosis not present

## 2016-01-24 DIAGNOSIS — L600 Ingrowing nail: Secondary | ICD-10-CM | POA: Diagnosis not present

## 2016-01-24 DIAGNOSIS — E1142 Type 2 diabetes mellitus with diabetic polyneuropathy: Secondary | ICD-10-CM | POA: Diagnosis not present

## 2016-01-30 ENCOUNTER — Ambulatory Visit (INDEPENDENT_AMBULATORY_CARE_PROVIDER_SITE_OTHER): Payer: Medicare HMO

## 2016-01-30 ENCOUNTER — Telehealth: Payer: Self-pay | Admitting: Cardiology

## 2016-01-30 DIAGNOSIS — I5022 Chronic systolic (congestive) heart failure: Secondary | ICD-10-CM

## 2016-01-30 DIAGNOSIS — Z95810 Presence of automatic (implantable) cardiac defibrillator: Secondary | ICD-10-CM

## 2016-01-30 NOTE — Telephone Encounter (Signed)
Confirmed remote transmission w/ pt wife. She stated that it will probably be first thing Tuesday morning.

## 2016-01-31 NOTE — Progress Notes (Signed)
EPIC Encounter for ICM Monitoring  Patient Name: Nathaniel Bolton is a 76 y.o. male Date: 01/31/2016 Primary Care Physican: Laverna Peace, NP Primary [place] Electrophysiologist: Allred Dry Weight:186 lbs          Spoke with wife.  Heart Failure questions reviewed, pt symptomatic with abdominal swelling and weight gain of 11 pounds since prior to breaking his leg at the beginning of January.  Previously was  175 lbs.     Thoracic impedance continues to be abnormal suggesting fluid accumulation.  Labs  01/03/2016 Creatinine 1.52, BUN 23, Potassium 4.5, Sodium 144, EGFR 44-51 12/19/2015 Creatinine 1.36, BUN 24, Potassium 4.7, Sodium 138, EGFR 51-59 11/21/2015 Creatinine 1.61, BUN 25, Potassium 4.7, Sodium 143 10/12/2015 Creatinine 1.37, BUN 26, Potassium 4.7, Sodium 143 09/15/2015 Creatinine 1.28, BUN 24, Potassium 4.2, Sodium 139 08/21/2017Creatinine 1.31, BUN 19, Potassium 4.5, Sodium 141, GFR 54-62 - obtained from [name]'s office  08/05/2015 Creatinine 1.61, BUN 24, Potassium 4.5, Sodium 139, GFR 42-48 07/24/2015 Creatinine 3.31, BUN 45, Potassium 4.7, Sodium 139, GFR 18-22 07/23/2015 Creatinine 4.05, BUN 54, Potassium 4.2, Sodium 140, GFR 15-18 07/22/2015 Creatinine 4.87, BUN 58, Potassium 4.2, Sodium 136, GFR 12-14 07/21/2015 Creatinine 4.58, BUN 52, Potassium 4.4, Sodium 133, GFR 13-15 07/20/2015 Creatinine 3.45, BUN 42, Potassium 3.9, Sodium 135, GFR 18-21  07/19/2015 Creatinine 1.71, BUN 28, Potassium 3.9, Sodium 138, GFR 39-48 07/18/2015 Creatinine 1.44, BUN 26, Potassium 3.9, Sodium 140, GFR 48-58 07/17/2015 Creatinine 1.28, BUN 23, Potassium 3.5, Sodium 141, GFR 55->60  07/16/2015 Creatinine 1.35, BUN 24, Potassium 3.9, Sodium 137, GFR 55->60 07/15/2015 Creatinine 1.13, BUN 25, Potassium 3.5, Sodium 141, GFR >60 03/02/2015 Creatinine 1.58, BUN 34, Potassium 4.5, Sodium135 11/22/2014 Creatinine 1.62, BUN 44, Potassium 4.7, Sodium137 10/14/2014 Creatinine 1.39,  BUN 35, Potassium, 5.2, Sodium135 03/22/2014 Creatinine 1.56 , BUN 33, Potassium 4.5, Sodium135  Recommendations:  Copy of ICM check sent to Dr Martinique and Dr Rayann Heman for review and recommendations.     Follow-up plan: ICM clinic phone appointment on 02/07/2016 to recheck fluid levels.  3 month ICM trend: 01/30/2016   1 Year ICM trend:      [name], RN 01/31/2016 8:10 AM

## 2016-02-02 NOTE — Progress Notes (Signed)
Received: Today  Message Contents  Peter M Martinique, MD  [name], RN        Honestly, I don't think his Optivol measurements have been very helpful. Elevated readings do not correlate with change in clinical status and increased diuretics haven't really impacted this. We have been trying to optimize CHF therapy with Entresto.   Peter Martinique MD, [facility]

## 2016-02-02 NOTE — Progress Notes (Signed)
Attempted call back to wife and left message for return call.

## 2016-02-02 NOTE — Progress Notes (Signed)
Wife returned call.  She stated patient's weight has increased 6 pounds since 1/23 and current weight is 192 lbs today, abdominal bloating and leg swelling.   Weight has increased 17 pounds since end of start of January when he broke his leg.  Baseline was 175 lbs.    She stated he felt like he was suffocating Tuesday night and took extra Furosemide without physician approval.  She understands the effect of Furosemide and knows kidney function is being monitored.    She says they are reviewing food labels and follow low salt diet.  He is consistently weighing with same amount of clothes after rising in the morning.    Advised Dr Martinique would like to continue to monitor at this time and no further recommendations.  Advised to go to ER if he worsens over the weekend and will recheck fluid levels on 02/07/2016.

## 2016-02-03 ENCOUNTER — Telehealth: Payer: Self-pay | Admitting: Cardiology

## 2016-02-03 NOTE — Telephone Encounter (Signed)
Returned call to patient's wife she stated entresto 97/103 mg was $ 47 last month.Stated she went to pick up prescription yesterday price was $ 200.Spoke to pharmacist at [facility] in [place] she stated patient has to pay deductible first.Advised patient's wife she will need to call insurance to find out the monthly cost after the deductible.

## 2016-02-03 NOTE — Telephone Encounter (Signed)
New Message  Pt voiced he is wanting to speak with the nurse.  Please f/u

## 2016-02-07 ENCOUNTER — Ambulatory Visit (INDEPENDENT_AMBULATORY_CARE_PROVIDER_SITE_OTHER): Payer: Medicare HMO

## 2016-02-07 DIAGNOSIS — Z95810 Presence of automatic (implantable) cardiac defibrillator: Secondary | ICD-10-CM

## 2016-02-07 DIAGNOSIS — I5022 Chronic systolic (congestive) heart failure: Secondary | ICD-10-CM

## 2016-02-07 NOTE — Progress Notes (Signed)
EPIC Encounter for ICM Monitoring  Patient Name: Nathaniel Bolton is a 76 y.o. male Date: 02/07/2016 Primary Care Physican: Laverna Peace, NP Primary [place] Electrophysiologist: Allred Dry Weight:unknown           Spoke with wife.  Heart Failure questions reviewed, pt remains same, weight gain and abdominal bloating but no worse.   She reported patient will not be taking Entresto because they cannot afford it. Advised her to call Dr Doug Sou office to inform him that he will not be taking Entresto in case Dr Martinique wants to prescribe a different med.   Thoracic impedance abnormal suggesting fluid accumulation.  Labs  01/03/2016 Creatinine 1.52, BUN 23, Potassium 4.5, Sodium 144, EGFR 44-51 12/19/2015 Creatinine 1.36, BUN 24, Potassium 4.7, Sodium 138, EGFR 51-59 11/21/2015 Creatinine 1.61, BUN 25, Potassium 4.7, Sodium 143 10/12/2015 Creatinine 1.37, BUN 26, Potassium 4.7, Sodium 143 09/15/2015 Creatinine 1.28, BUN 24, Potassium 4.2, Sodium 139 08/21/2017Creatinine 1.31, BUN 19, Potassium 4.5, Sodium 141, GFR 54-62 - obtained from [name]'s office  08/05/2015 Creatinine 1.61, BUN 24, Potassium 4.5, Sodium 139, GFR 42-48 07/24/2015 Creatinine 3.31, BUN 45, Potassium 4.7, Sodium 139, GFR 18-22 07/23/2015 Creatinine 4.05, BUN 54, Potassium 4.2, Sodium 140, GFR 15-18 07/22/2015 Creatinine 4.87, BUN 58, Potassium 4.2, Sodium 136, GFR 12-14 07/21/2015 Creatinine 4.58, BUN 52, Potassium 4.4, Sodium 133, GFR 13-15 07/20/2015 Creatinine 3.45, BUN 42, Potassium 3.9, Sodium 135, GFR 18-21  07/19/2015 Creatinine 1.71, BUN 28, Potassium 3.9, Sodium 138, GFR 39-48 07/18/2015 Creatinine 1.44, BUN 26, Potassium 3.9, Sodium 140, GFR 48-58 07/17/2015 Creatinine 1.28, BUN 23, Potassium 3.5, Sodium 141, GFR 55->60  07/16/2015 Creatinine 1.35, BUN 24, Potassium 3.9, Sodium 137, GFR 55->60 07/15/2015 Creatinine 1.13, BUN 25, Potassium 3.5, Sodium 141, GFR >60 03/02/2015 Creatinine 1.58, BUN  34, Potassium 4.5, Sodium135 11/22/2014 Creatinine 1.62, BUN 44, Potassium 4.7, Sodium137 10/14/2014 Creatinine 1.39, BUN 35, Potassium, 5.2, Sodium135 03/22/2014 Creatinine 1.56 , BUN 33, Potassium 4.5, Sodium135  Recommendations:   No changes. Advised would send copy to Dr Martinique and Dr Rayann Heman and if any recommendations will call her back.   Follow-up plan: ICM clinic phone appointment on 03/02/2016 and office appointment with Dr Martinique 03/05/2016.  .  3 month ICM trend: 02/06/2016   1 Year ICM trend:      [name], RN 02/07/2016 1:53 PM

## 2016-02-08 ENCOUNTER — Telehealth: Payer: Self-pay | Admitting: Cardiology

## 2016-02-08 DIAGNOSIS — M79662 Pain in left lower leg: Secondary | ICD-10-CM

## 2016-02-08 DIAGNOSIS — I2510 Atherosclerotic heart disease of native coronary artery without angina pectoris: Secondary | ICD-10-CM

## 2016-02-08 DIAGNOSIS — I2583 Coronary atherosclerosis due to lipid rich plaque: Principal | ICD-10-CM

## 2016-02-08 DIAGNOSIS — M79661 Pain in right lower leg: Secondary | ICD-10-CM

## 2016-02-08 MED ORDER — LOSARTAN POTASSIUM 50 MG PO TABS: 50.0000 mg | ORAL_TABLET | Freq: Every day | ORAL | 6 refills | Status: DC

## 2016-02-08 NOTE — Telephone Encounter (Signed)
Phone busy.

## 2016-02-08 NOTE — Telephone Encounter (Signed)
New message    Patient wife calling    Pt c/o medication issue:  1. Name of Medication: sacubitril-valsartan (ENTRESTO) 97-103 MG  2. How are you currently taking this medication (dosage and times per day)? Wife thinks it's twice a day   3. Are you having a reaction (difficulty breathing--STAT)? No   4. What is your medication issue? Unable to pick this medication up      Pt c/o Shortness Of Breath: STAT if SOB developed within the last 24 hours or pt is noticeably SOB on the phone  1. Are you currently SOB (can you hear that pt is SOB on the phone)? Wife calling    2. How long have you been experiencing SOB? Yesterday & Thursday   3. Are you SOB when sitting or when up moving around? Sitting up    4. Are you currently experiencing any other symptoms? Started today

## 2016-02-08 NOTE — Telephone Encounter (Signed)
See previous 02/08/16 note.

## 2016-02-08 NOTE — Telephone Encounter (Signed)
Spoke to patient's wife.She stated Entresto too expensive.Dr.Jordan advised to stop.Start Losartan 50 mg daily.She stated he has been sob for the past 2 days.Dr.Jordan advised ok to increase lasix 40 mg twice a day for 4 days then back to normal dose.Dr.Jordan reviewed right lower ext venous doppler done at [facility] 01/13/16.He advised needs lower ext arterial dopplers scheduled.Advised our scheduler will call back tomorrow to schedule.Advised to keep appointment as planned with Dr.Jordan and call back sooner if he continues to be sob.

## 2016-02-13 DIAGNOSIS — S82831A Other fracture of upper and lower end of right fibula, initial encounter for closed fracture: Secondary | ICD-10-CM | POA: Diagnosis not present

## 2016-02-15 ENCOUNTER — Telehealth: Payer: Self-pay | Admitting: Cardiology

## 2016-02-15 NOTE — Telephone Encounter (Signed)
Records received from [facility] for apt on 03/05/16 with Dr Martinique, records placed in Dr Morrison Old schedule for 03/05/16.CN

## 2016-02-20 ENCOUNTER — Other Ambulatory Visit: Payer: Self-pay

## 2016-02-20 ENCOUNTER — Ambulatory Visit (HOSPITAL_COMMUNITY)
Admission: RE | Admit: 2016-02-20 | Discharge: 2016-02-20 | Disposition: A | Discharge status: Home / Self Care | Payer: Medicare HMO | Source: Ambulatory Visit | Attending: Internal Medicine | Admitting: Internal Medicine

## 2016-02-20 DIAGNOSIS — I340 Nonrheumatic mitral (valve) insufficiency: Secondary | ICD-10-CM | POA: Diagnosis not present

## 2016-02-20 DIAGNOSIS — Z01818 Encounter for other preprocedural examination: Secondary | ICD-10-CM

## 2016-02-20 DIAGNOSIS — Z006 Encounter for examination for normal comparison and control in clinical research program: Secondary | ICD-10-CM

## 2016-02-20 DIAGNOSIS — E119 Type 2 diabetes mellitus without complications: Secondary | ICD-10-CM | POA: Diagnosis not present

## 2016-02-20 DIAGNOSIS — I5022 Chronic systolic (congestive) heart failure: Secondary | ICD-10-CM

## 2016-02-20 DIAGNOSIS — I119 Hypertensive heart disease without heart failure: Secondary | ICD-10-CM | POA: Diagnosis not present

## 2016-02-20 NOTE — Progress Notes (Signed)
  Echocardiogram 2D Echocardiogram has been performed.  Tresa Res 02/20/2016, 1:11 PM

## 2016-02-20 NOTE — Progress Notes (Addendum)
Patient present for BeAT-HF screening visit. Patient and wife present. Patient states despite a low sodium diet and taking prescribed heart failure medications he continues to struggle with fluid build up. His wife prepares his pill box, stopped Entresto 01-16-16 as she just had samples; and they could not afford prescription.  Losartan started 01-17-16, he is tolerating well and the drug is affordable. He states the fluid sits in his chest, abd, and lower legs; it makes him short of breath and tired. He states he is unsure why he continues to have high device (optivol) readings. He and his wife read consent, no study procedures before consent signed. Consent signed, vital signs, EKG, study labs, 6 min hall-walk and echo performed today. He is wearing a walking boot on his RLE d/t a fracture in Dec/17. He states he is walking ok with boot, does get short of breath with activity. Patient states he is happy to participate in the study as he likes the idea of something different than more pills. He does verbalize that he will continue his current medication regime and will continue to take his medications as directed. He will await a call from study coordinator with lab results. Spoke to patient and wife at length about timeline of study and if randomized to device implant the next implant date is March 14th; both verbalize understanding. Very pleasant patient. Patient has appointment with Dr. Martinique on 03-05-16. Coordinator has spoke to Dr. Martinique about the study, he states this may help the patient. Will await lower extremity doppler study, but thus far patient is screening positive to be in the study. He see Dr. Trula Slade on the 26th as well.

## 2016-02-24 ENCOUNTER — Encounter: Payer: Self-pay | Admitting: Cardiology

## 2016-02-24 ENCOUNTER — Telehealth: Payer: Self-pay

## 2016-02-24 NOTE — Telephone Encounter (Signed)
Discussed symptoms with Rosaria Ferries, PA and she advised patient can add 1 extra Furosemide 20 mg on Saturday and Sunday and Monday to go back to prescribed dosage.

## 2016-02-24 NOTE — Telephone Encounter (Signed)
Received call from wife reported patient has gained 1 pound since last night, has increase in shortness of breath, and bilateral leg swelling.  She said he is not feeling well. She spoke with Desmond Dike, RN in research department and was told BNP was 374 from 02/20/2016 lab results.   Patient is not home and unable to send ICM remote transmission.

## 2016-02-24 NOTE — Telephone Encounter (Signed)
Call back to wife.  Glenetta Borg, PA recommended patient can take 1 extra Furosemide 20 mg on Saturday and Sunday and return to prescribed dosage Monday.  Advised to use ER if he worsens over the weekend.  Requested to send remote transmission on Monday, 02/27/2016 for review.  Encouraged patient to weigh and record for the next 3 mornings after getting out of bed with same amount of clothes to determine if he has lost any fluid weight.

## 2016-02-27 ENCOUNTER — Ambulatory Visit (INDEPENDENT_AMBULATORY_CARE_PROVIDER_SITE_OTHER): Payer: Medicare HMO

## 2016-02-27 DIAGNOSIS — E663 Overweight: Secondary | ICD-10-CM | POA: Diagnosis not present

## 2016-02-27 DIAGNOSIS — Z95810 Presence of automatic (implantable) cardiac defibrillator: Secondary | ICD-10-CM | POA: Diagnosis not present

## 2016-02-27 DIAGNOSIS — N189 Chronic kidney disease, unspecified: Secondary | ICD-10-CM | POA: Diagnosis not present

## 2016-02-27 DIAGNOSIS — I5022 Chronic systolic (congestive) heart failure: Secondary | ICD-10-CM | POA: Diagnosis not present

## 2016-02-27 DIAGNOSIS — E1151 Type 2 diabetes mellitus with diabetic peripheral angiopathy without gangrene: Secondary | ICD-10-CM | POA: Diagnosis not present

## 2016-02-27 DIAGNOSIS — I10 Essential (primary) hypertension: Secondary | ICD-10-CM | POA: Diagnosis not present

## 2016-02-27 DIAGNOSIS — J069 Acute upper respiratory infection, unspecified: Secondary | ICD-10-CM | POA: Diagnosis not present

## 2016-02-27 DIAGNOSIS — E785 Hyperlipidemia, unspecified: Secondary | ICD-10-CM | POA: Diagnosis not present

## 2016-02-27 NOTE — Progress Notes (Signed)
EPIC Encounter for ICM Monitoring  Patient Name: Nathaniel Bolton is a 76 y.o. male Date: 02/27/2016 Primary Care Physican: Laverna Peace, NP Primary [place] Electrophysiologist: Allred Dry Weight:unknown        Spoke with wife.  She stated patient is feeling better, decrease swelling in legs and breathing has improved.    Thoracic impedance continues to be abnormal suggesting fluid accumulation after taking 1 additional Furosemide 20 mg tablet x 2 days as recommended by Rosaria Ferries, PA and returned to prescribed dosage today.   Current prescribed dose of Furosemide 20 mg 1 tablet twice a day.   Labs  12/26/2017Creatinine 1.52, BUN 23, Potassium 4.5, Sodium 144, EGFR 44-51 12/19/2015 Creatinine 1.36, BUN 24, Potassium 4.7, Sodium 138, EGFR 51-59 11/21/2015 Creatinine 1.61, BUN 25, Potassium 4.7, Sodium 143 10/12/2015 Creatinine 1.37, BUN 26, Potassium 4.7, Sodium 143 09/15/2015 Creatinine 1.28, BUN 24, Potassium 4.2, Sodium 139 08/21/2017Creatinine 1.31, BUN 19, Potassium 4.5, Sodium 141, GFR 54-62 - obtained from [name]'s office  08/05/2015 Creatinine 1.61, BUN 24, Potassium 4.5, Sodium 139, GFR 42-48 07/24/2015 Creatinine 3.31, BUN 45, Potassium 4.7, Sodium 139, GFR 18-22 07/23/2015 Creatinine 4.05, BUN 54, Potassium 4.2, Sodium 140, GFR 15-18 07/22/2015 Creatinine 4.87, BUN 58, Potassium 4.2, Sodium 136, GFR 12-14 07/21/2015 Creatinine 4.58, BUN 52, Potassium 4.4, Sodium 133, GFR 13-15 07/20/2015 Creatinine 3.45, BUN 42, Potassium 3.9, Sodium 135, GFR 18-21  07/19/2015 Creatinine 1.71, BUN 28, Potassium 3.9, Sodium 138, GFR 39-48 07/18/2015 Creatinine 1.44, BUN 26, Potassium 3.9, Sodium 140, GFR 48-58 07/17/2015 Creatinine 1.28, BUN 23, Potassium 3.5, Sodium 141, GFR 55->60  07/16/2015 Creatinine 1.35, BUN 24, Potassium 3.9, Sodium 137, GFR 55->60 07/15/2015 Creatinine 1.13, BUN 25, Potassium 3.5, Sodium 141, GFR >60 03/02/2015 Creatinine 1.58, BUN 34,  Potassium 4.5, Sodium135 11/22/2014 Creatinine 1.62, BUN 44, Potassium 4.7, Sodium137 10/14/2014 Creatinine 1.39, BUN 35, Potassium, 5.2, Sodium135 03/22/2014 Creatinine 1.56 , BUN 33, Potassium 4.5, Sodium135  Recommendations: No changes. Reminded to limit dietary salt intake to 2000 mg/day and fluid intake to < 2 liters/day. Encouraged to call for fluid symptoms.  Follow-up plan: ICM clinic phone appointment on 03/12/2016 to recheck fluid levels.  Office appointment with Dr Martinique 03/05/2016.    Copy of ICM check sent to primary cardiologist and device physician.   3 month ICM trend: 02/27/2016   1 Year ICM trend:      [name], RN 02/27/2016 3:29 PM

## 2016-02-28 ENCOUNTER — Encounter: Payer: Self-pay | Admitting: Surgery

## 2016-02-29 ENCOUNTER — Other Ambulatory Visit: Payer: Self-pay | Admitting: Cardiology

## 2016-02-29 DIAGNOSIS — M79661 Pain in right lower leg: Secondary | ICD-10-CM

## 2016-02-29 DIAGNOSIS — I2510 Atherosclerotic heart disease of native coronary artery without angina pectoris: Secondary | ICD-10-CM

## 2016-02-29 DIAGNOSIS — I2583 Coronary atherosclerosis due to lipid rich plaque: Principal | ICD-10-CM

## 2016-02-29 DIAGNOSIS — M79662 Pain in left lower leg: Secondary | ICD-10-CM

## 2016-03-01 ENCOUNTER — Ambulatory Visit (HOSPITAL_COMMUNITY)
Admission: RE | Admit: 2016-03-01 | Discharge: 2016-03-01 | Disposition: A | Discharge status: Home / Self Care | Payer: Medicare HMO | Source: Ambulatory Visit | Attending: Cardiovascular Disease | Admitting: Cardiovascular Disease

## 2016-03-01 DIAGNOSIS — M79662 Pain in left lower leg: Secondary | ICD-10-CM | POA: Insufficient documentation

## 2016-03-01 DIAGNOSIS — I70203 Unspecified atherosclerosis of native arteries of extremities, bilateral legs: Secondary | ICD-10-CM | POA: Diagnosis not present

## 2016-03-01 DIAGNOSIS — I2510 Atherosclerotic heart disease of native coronary artery without angina pectoris: Secondary | ICD-10-CM | POA: Diagnosis not present

## 2016-03-01 DIAGNOSIS — I2583 Coronary atherosclerosis due to lipid rich plaque: Secondary | ICD-10-CM | POA: Insufficient documentation

## 2016-03-01 DIAGNOSIS — M79661 Pain in right lower leg: Secondary | ICD-10-CM | POA: Diagnosis not present

## 2016-03-03 NOTE — Progress Notes (Addendum)
Nathaniel Bolton Date of Birth: [birth year] Medical Record #[id number]  History of Present Illness: Nathaniel Bolton is seen back today for followup of congestive heart failure.  He has a history of congestive heart failure with ejection fraction of 27%. He also has a history of coronary disease with remote coronary bypass surgery. He has a history of ventricular tachycardia and is s/p ICD implant.  In August 2016 he had a sustained episode of VT at 175 bpm. This did not exceed his rate limit and this was later adjusted. No recurrence. He also has a history of DM, CKD III, HTN, HLD, ED, OLD, atrial fib (CHA2DS2-VASc=7, DM, HTN, CAD, CHF, age x 1, CVA x 2) on Eliquis, CABG, VT, MDT ICD, ICM w/ EF 27% by MV 2012 (25-30% by echo 2012). Most recent Echo shows EF 30%.   He has persistent evidence of fluid overload by Optivol. Diuretic therapy has been variable.  We attempted to start him on Entresto but he was unable to afford.  His nephrologist stopped lisinopril in July 2017. We later added losartan. He was recently enrolled in the Barostim trial.   On follow up today he reports his breathing improved when diuretic dose increased. He fell 8 weeks ago and fractured his right fibula. Wearing a boot now. Trying to watch salt intake.   Current Outpatient Prescriptions on File Prior to Visit  Medication Sig Dispense Refill  . allopurinol (ZYLOPRIM) 100 MG tablet Take 100 mg by mouth.    Marland Kitchen amiodarone (PACERONE) 200 MG tablet Take 1 tablet (200 mg total) by mouth daily. 90 tablet 3  . apixaban (ELIQUIS) 5 MG TABS tablet Take 1 tablet (5 mg total) by mouth 2 (two) times daily. 60 tablet 0  . Ascorbic Acid (VITAMIN C) 1000 MG tablet Take 1,000 mg by mouth daily.    Marland Kitchen atorvastatin (LIPITOR) 40 MG tablet Take 40 mg by mouth daily at 6 PM.     . carvedilol (COREG) 6.25 MG tablet Take 1 tablet by mouth 2 (two) times daily with a meal.     . Cholecalciferol (VITAMIN D3) 2000 units TABS Take 1 tablet by mouth 2  (two) times daily.    Marland Kitchen glipiZIDE (GLUCOTROL) 5 MG tablet Take 2.5 mg by mouth.    . insulin aspart protamine- aspart (NOVOLOG MIX 70/30) (70-30) 100 UNIT/ML injection Inject into the skin.    Marland Kitchen losartan (COZAAR) 50 MG tablet Take 1 tablet (50 mg total) by mouth daily. 30 tablet 6  . NITROSTAT 0.4 MG SL tablet Place 0.4 mg under the tongue every 5 (five) minutes as needed for chest pain (MAX 3 TABLETS).     . NON FORMULARY Needles, syringes with needles, glucose reagent test strips    . Omega-3 Fatty Acids (FISH OIL PO) Take 2 tablets by mouth 2 (two) times daily.     Marland Kitchen omeprazole (PRILOSEC) 20 MG capsule Take 40 mg by mouth.    . Saw Palmetto, Serenoa repens, (SAW PALMETTO PO) Take 1 tablet by mouth daily.     . sertraline (ZOLOFT) 50 MG tablet Take 50 mg by mouth daily.     Marland Kitchen VITAMIN E PO Take 1 tablet by mouth daily.      No current facility-administered medications on file prior to visit.     Allergies  Allergen Reactions  . Adhesive [Tape] Itching and Rash    Testosterone patch adhesive    Past Medical History:  Diagnosis Date  . CAD (coronary artery  disease)   . CKD stage 3 due to type 2 diabetes mellitus ([place])   . Diabetes mellitus    Type 2  . Erectile dysfunction   . Gout   . Hyperlipidemia   . Hypertension   . ICD (implantable cardiac defibrillator) in place   . Myocardial infarction, old    x2 in [place]. S/P CABG 2003, Dr Roxy Manns  . Obstructive lung disease (HCC)    Moderate per prior PFT's  . Paroxysmal atrial fibrillation ([place]) 3/17   discovered on event monitor  . Stroke [facility])    2013  . Systolic CHF, acute on chronic (HCC)    EF is 27% per myoview and echo October 2012  . Ventricular tachycardia ([place]) 08/2014   175 bpm    Past Surgical History:  Procedure Laterality Date  . BYPASS GRAFT     x5  . CARDIAC DEFIBRILLATOR PLACEMENT  2008   [facility] by Dr Elonda Husky at [facility]  . cataract surgery    . CHOLECYSTECTOMY    . KNEE ARTHROSCOPY      right  . UMBILICAL HERNIA REPAIR      History  Smoking Status  . Former Smoker  Smokeless Tobacco  . Never Used    History  Alcohol Use No    Family History  Problem Relation Age of Onset  . Heart failure Mother   . Heart attack Father   . Heart disease Sister     valve replaced  . Heart attack Brother     CABG, aortic grafting    Review of Systems: The review of systems is as noted in history of present illness. All other systems were reviewed and are negative.  Physical Exam: BP (!) 150/72   Pulse (!) 50   Ht 5\' 10"  (1.778 m)   Wt 181 lb (82.1 kg)   BMI 25.97 kg/m  Patient is very pleasant and in no acute distress. Skin is warm and dry. Color is normal.   Normocephalic/atraumatic. PERRL. Sclera are nonicteric. Neck is supple. No masses. Mild JVD. Lungs are clear. Cardiac exam shows a regular rate and rhythm. There is no gallop or murmur. Abdomen is soft, obese, and nontender. Extremities reveal no pretibial edema. Gait and ROM are intact. Boot on right foot. No gross neurologic deficits noted.   LABORATORY DATA:   Lab Results  Component Value Date   GLUCOSE 159 (H) 01/03/2016   NA 144 01/03/2016   K 4.5 01/03/2016   CL 102 01/03/2016   CREATININE 1.52 (H) 01/03/2016   BUN 23 01/03/2016   CO2 25 01/03/2016   Labs dated 02/27/16: cholesterol 105, triglycerides 107, HDL 28, LDL 56, BUN 27, creatinine 1.38. Remainder of CMET normal.   Labs from [facility] nephrologist: dated 10/13/15: Hgb 12.7. BUN 26, creatinine 1.37, other chemistries normal.   Echo 02/20/16: Study Conclusions  - Left ventricle: The cavity size was moderately dilated. Wall   thickness was normal. - Mitral valve: There was mild to moderate regurgitation. Valve   area by pressure half-time: 2.42 cm^2. Valve area by continuity   equation (using LVOT flow): 0.99 cm^2. - Left atrium: The atrium was severely dilated. - Right ventricle: Systolic function was moderately reduced. - Right atrium:  The atrium was moderately dilated.  ------------------------------------------------------------------- Study data:   Study status:  Routine.  Procedure:  Transthoracic echocardiography. Image quality was adequate.  Study completion: There were no complications.          Transthoracic echocardiography.  M-mode, complete 2D, spectral Doppler, and color Doppler.  Birthdate:  Patient birthdate: 09-10-1940.  Age:  Patient is 76 yr old.  Sex:  Gender: male.    BMI: 24.8 kg/m^2.  Blood pressure:     132/62  Patient status:  Outpatient.  Study date: Study date: 02/20/2016. Study time: 12:13 PM.  Location:  Echo laboratory.  -------------------------------------------------------------------  ------------------------------------------------------------------- Left ventricle:  LVEF is severely depressed at approximately 30% with seve hypokinesis of the anterior, anterolateral, inferior, inferolateral walls. The cavity size was moderately dilated. Wall thickness was normal.  ------------------------------------------------------------------- Aortic valve:   Mildly thickened, mildly calcified leaflets. Doppler:  There was no significant regurgitation.  ------------------------------------------------------------------- Mitral valve:   Structurally normal valve.   Leaflet separation was normal.  Doppler:  Transvalvular velocity was within the normal range. There was no evidence for stenosis. There was mild to moderate regurgitation.    Valve area by pressure half-time: 2.42 cm^2. Indexed valve area by pressure half-time: 1.2 cm^2/m^2. Valve area by continuity equation (using LVOT flow): 0.99 cm^2. Indexed valve area by continuity equation (using LVOT flow): 0.49 cm^2/m^2.    Mean gradient (D): 3 mm Hg. Peak gradient (D): 9 mm Hg.  ------------------------------------------------------------------- Left atrium:  The atrium was severely  dilated.  ------------------------------------------------------------------- Right ventricle:  The cavity size was normal. Systolic function was moderately reduced.  ------------------------------------------------------------------- Pulmonic valve:   Poorly visualized.  Doppler:  There was mild regurgitation.  ------------------------------------------------------------------- Tricuspid valve:   Structurally normal valve.   Leaflet separation was normal.  Doppler:  Transvalvular velocity was within the normal range. There was mild regurgitation.  ------------------------------------------------------------------- Right atrium:  The atrium was moderately dilated.  ------------------------------------------------------------------- Pericardium:  There was no pericardial effusion.  ------------------------------------------------------------------- Systemic veins: Inferior vena cava: The vessel was dilated. The respirophasic diameter changes were blunted (< 50%), consistent with elevated central venous pressure.  ------------------------------------------------------------------- Measurements   Left ventricle                           Value          Reference  LV ID, ED, PLAX chordal           (H)    56.7  mm       43 - 52  LV ID, ES, PLAX chordal           (H)    48.4  mm       23 - 38  LV fx shortening, PLAX chordal    (L)    15    %        >=29  LV PW thickness, ED                      11.8  mm       ----------  IVS/LV PW ratio, ED                      1.14           <=1.3  Stroke volume, 2D                        56    ml       ----------  Stroke volume/bsa, 2D                    28    ml/m^2   ----------  LV ejection fraction,  1-p A4C            42    %        ----------  LV end-diastolic volume, 2-p             193   ml       ----------  LV end-systolic volume, 2-p              132   ml       ----------  LV ejection fraction, 2-p                32    %         ----------  Stroke volume, 2-p                       61    ml       ----------  LV end-diastolic volume/bsa, 2-p         96    ml/m^2   ----------  LV end-systolic volume/bsa, 2-p          65    ml/m^2   ----------  Stroke volume/bsa, 2-p                   30.2  ml/m^2   ----------  LV e&', lateral                           9.28  cm/s     ----------  LV E/e&', lateral                         15.73          ----------  LV e&', medial                            3.99  cm/s     ----------  LV E/e&', medial                          36.59          ----------  LV e&', average                           6.64  cm/s     ----------  LV E/e&', average                         22             ----------  Longitudinal strain, TDI                 11    %        ----------    Ventricular septum                       Value          Reference  IVS thickness, ED                        13.5  mm       ----------    LVOT  Value          Reference  LVOT ID, S                               20    mm       ----------  LVOT area                                3.14  cm^2     ----------  LVOT peak velocity, S                    69    cm/s     ----------  LVOT mean velocity, S                    50.2  cm/s     ----------  LVOT VTI, S                              17.7  cm       ----------    Aorta                                    Value          Reference  Aortic root ID, ED                       32    mm       ----------    Left atrium                              Value          Reference  LA ID, A-P, ES                           51    mm       ----------  LA ID/bsa, A-P                    (H)    2.53  cm/m^2   <=2.2  LA volume, S                             112   ml       ----------  LA volume/bsa, S                         55.5  ml/m^2   ----------  LA volume, ES, 1-p A4C                   107   ml       ----------  LA volume/bsa, ES, 1-p A4C               53.1  ml/m^2    ----------  LA volume, ES, 1-p A2C                   115   ml       ----------  LA volume/bsa, ES, 1-p A2C  57    ml/m^2   ----------    Mitral valve                             Value          Reference  Mitral E-wave peak velocity              146   cm/s     ----------  Mitral mean velocity, D                  68.3  cm/s     ----------  Mitral deceleration time                 187   ms       150 - 230  Mitral pressure half-time                99    ms       ----------  Mitral mean gradient, D                  3     mm Hg    ----------  Mitral peak gradient, D                  9     mm Hg    ----------  Mitral valve area, PHT, DP               2.42  cm^2     ----------  Mitral valve area/bsa, PHT, DP           1.2   cm^2/m^2 ----------  Mitral valve area, LVOT                  0.99  cm^2     ----------  continuity  Mitral valve area/bsa, LVOT              0.49  cm^2/m^2 ----------  continuity  Mitral annulus VTI, D                    56.2  cm       ----------  Mitral regurg VTI, PISA                  188   cm       ----------  Mitral ERO, PISA                         0.1   cm^2     ----------  Mitral regurg volume, PISA               19    ml       ----------    Tricuspid valve                          Value          Reference  Tricuspid regurg peak velocity           243   cm/s     ----------  Tricuspid peak RV-RA gradient            24    mm Hg    ----------    Right atrium  Value          Reference  RA ID, S-I, ES, A4C               (H)    61.2  mm       34 - 49  RA area, ES, A4C                  (H)    23    cm^2     8.3 - 19.5  RA volume, ES, A/L                       70.1  ml       ----------  RA volume/bsa, ES, A/L                   34.8  ml/m^2   ----------    Right ventricle                          Value          Reference  RV s&', lateral, S                        7.97  cm/s     ----------    Pulmonic valve                            Value          Reference  Pulmonic regurg velocity, ED             130   cm/s     ----------  Pulmonic regurg gradient, ED             7     mm Hg    ----------  Legend: (L)  and  (H)  mark values outside specified reference range.  ------------------------------------------------------------------- Prepared and Electronically Authenticated by  Dorris Carnes, M.D. 2018-02-12T13:30:34  Assessment / Plan: 1. Chronic systolic CHF. EF 30%. Continue carvedilol. Not a candidate for aldactone due to renal issues. I think we need to maintain him on a higher dose of lasix. Will increase to 40 mg bid.   Stressed importance of sodium restriction. [facility] Renal function has been stable.  I suspect he does have some diuretic resistance related to his CKD. Plan to be enrolled in Barostim trial.  2.  Status post CVA.  3. Coronary disease with remote myocardial infarctions in 1980 and 1990. Status post CABG. Patient is without anginal symptoms. Myoview study in October of 2012 showed evidence of an old infarct without ischemia. He is asymptomatic.  4. Status post ICD implant. ICD check Mar 02, 1015 was satisfactory. History of VT in August 2016 without recurrence.   5. CKD stage 3.  6. DM  on insulin- reviewed recommendations for low Carb diet.  7. Atrial fibrillation, paroxysmal. Diagnosed on EP follow up in April. Now on Eliquis. Continue amiodarone. Rate controlled.   8. HTN- controlled.  Follow up in 3 months.  Addendum: I was just made aware that Nathaniel Bolton was admitted July 2017 with syncope and VT. Was loaded with amiodarone. Cardiac cath done that showed EF 20%. Patent LIMA to LAD, patent SVG to RCA and occluded SVG to OMs. This was discussed with Dr. Roxy Manns as to whether he would be  a redo surgery candidate and review of his initial op note in 2002 he felt that this was not an option. Will request films from [place] to  review and see if there are any PCI options. The patient never mentioned this evaluation on follow up here.  Tyshan Enderle Martinique MD, [facility] 03/14/2016

## 2016-03-05 ENCOUNTER — Encounter: Payer: Self-pay | Admitting: Surgery

## 2016-03-05 ENCOUNTER — Ambulatory Visit (INDEPENDENT_AMBULATORY_CARE_PROVIDER_SITE_OTHER): Payer: Medicare HMO | Admitting: Cardiology

## 2016-03-05 ENCOUNTER — Ambulatory Visit (HOSPITAL_COMMUNITY)
Admission: RE | Admit: 2016-03-05 | Discharge: 2016-03-05 | Disposition: A | Discharge status: Home / Self Care | Payer: Medicare HMO | Source: Ambulatory Visit | Attending: Surgery | Admitting: Surgery

## 2016-03-05 ENCOUNTER — Encounter: Payer: Self-pay | Admitting: Cardiology

## 2016-03-05 ENCOUNTER — Ambulatory Visit (INDEPENDENT_AMBULATORY_CARE_PROVIDER_SITE_OTHER): Payer: Medicare HMO | Admitting: Surgery

## 2016-03-05 VITALS — BP 143/73 | HR 49 | Temp 97.0°F | Resp 16 | Ht 70.0 in | Wt 187.0 lb

## 2016-03-05 VITALS — BP 150/72 | HR 50 | Ht 70.0 in | Wt 181.0 lb

## 2016-03-05 DIAGNOSIS — I5022 Chronic systolic (congestive) heart failure: Secondary | ICD-10-CM

## 2016-03-05 DIAGNOSIS — I2510 Atherosclerotic heart disease of native coronary artery without angina pectoris: Secondary | ICD-10-CM | POA: Diagnosis not present

## 2016-03-05 DIAGNOSIS — Z01818 Encounter for other preprocedural examination: Secondary | ICD-10-CM | POA: Insufficient documentation

## 2016-03-05 DIAGNOSIS — I480 Paroxysmal atrial fibrillation: Secondary | ICD-10-CM | POA: Diagnosis not present

## 2016-03-05 DIAGNOSIS — Z95810 Presence of automatic (implantable) cardiac defibrillator: Secondary | ICD-10-CM | POA: Diagnosis not present

## 2016-03-05 DIAGNOSIS — I2583 Coronary atherosclerosis due to lipid rich plaque: Secondary | ICD-10-CM

## 2016-03-05 DIAGNOSIS — Z006 Encounter for examination for normal comparison and control in clinical research program: Secondary | ICD-10-CM | POA: Insufficient documentation

## 2016-03-05 LAB — VAS US CAROTID
LCCADDIAS: -19 cm/s
LCCADSYS: -86 cm/s
LEFT ECA DIAS: -7 cm/s
LEFT VERTEBRAL DIAS: -12 cm/s
LICAPSYS: 82 cm/s
Left ICA prox dias: 16 cm/s
RCCAPDIAS: 16 cm/s
RIGHT CCA MID DIAS: 15 cm/s
RIGHT ECA DIAS: -9 cm/s
RIGHT VERTEBRAL DIAS: -14 cm/s
Right CCA prox sys: 89 cm/s

## 2016-03-05 MED ORDER — FUROSEMIDE 40 MG PO TABS: 40.0000 mg | ORAL_TABLET | Freq: Two times a day (BID) | ORAL | 3 refills | Status: DC

## 2016-03-05 NOTE — Progress Notes (Signed)
[facility] [place]  Patient name: Nathaniel Bolton MRN: [id number] DOB: 23-Dec-1940 Sex: male   REFERRING PROVIDER:    Dr. Martinique   REASON FOR CONSULT:    Barostim Eval  HISTORY OF PRESENT ILLNESS:   Nathaniel Bolton is a 76 y.o. male, who is referred for carotid sinus stimulator.  He has a history of congestive heart failure with an ejection fraction of 27%.  He has remote history of coronary artery disease.  He has AICD implant secondary to ventricular tachycardia.  He has a history of paroxysmal A. fib and is on Eliquis.  He takes a statin for hypercholesterolemia.  He is medical management hypertension.  PAST MEDICAL HISTORY    Past Medical History:  Diagnosis Date  . CAD (coronary artery disease)   . CKD stage 3 due to type 2 diabetes mellitus ([place])   . Diabetes mellitus    Type 2  . Erectile dysfunction   . Gout   . Hyperlipidemia   . Hypertension   . ICD (implantable cardiac defibrillator) in place   . Myocardial infarction, old    x2 in [place]. S/P CABG 2003, Dr Roxy Manns  . Obstructive lung disease (HCC)    Moderate per prior PFT's  . Paroxysmal atrial fibrillation ([place]) 3/17   discovered on event monitor  . Stroke [facility])    2013  . Systolic CHF, acute on chronic (HCC)    EF is 27% per myoview and echo October 2012  . Ventricular tachycardia ([place]) 08/2014   175 bpm     FAMILY HISTORY   Family History  Problem Relation Age of Onset  . Heart failure Mother   . Heart attack Father   . Heart disease Sister     valve replaced  . Heart attack Brother     CABG, aortic grafting    SOCIAL HISTORY:   Social History   Social History  . Marital status: Married    Spouse name: N/A  . Number of children: 4  . Years of education: N/A   Occupational History  . heavy Radio producer    Social History Main Topics  . Smoking status: Former Smoker    Quit date: 1991  . Smokeless tobacco:  Never Used  . Alcohol use No  . Drug use: No  . Sexual activity: Not on file   Other Topics Concern  . Not on file   Social History Narrative  . No narrative on file    ALLERGIES:    Allergies  Allergen Reactions  . Adhesive [Tape] Itching and Rash    Testosterone patch adhesive    CURRENT MEDICATIONS:    Current Outpatient Prescriptions  Medication Sig Dispense Refill  . allopurinol (ZYLOPRIM) 100 MG tablet Take 100 mg by mouth.    Marland Kitchen amiodarone (PACERONE) 200 MG tablet Take 1 tablet (200 mg total) by mouth daily. 90 tablet 3  . apixaban (ELIQUIS) 5 MG TABS tablet Take 1 tablet (5 mg total) by mouth 2 (two) times daily. 60 tablet 0  . Ascorbic Acid (VITAMIN C) 1000 MG tablet Take 1,000 mg by mouth daily.    Marland Kitchen atorvastatin (LIPITOR) 40 MG tablet Take 40 mg by mouth daily at 6 PM.     . carvedilol (COREG) 6.25 MG tablet Take 1 tablet by mouth 2 (two) times daily with a meal.     . Cholecalciferol (VITAMIN D3) 2000 units TABS Take 1 tablet by mouth 2 (two) times daily.    Marland Kitchen  furosemide (LASIX) 40 MG tablet Take 1 tablet (40 mg total) by mouth 2 (two) times daily. (Patient taking differently: Take 80 mg by mouth 2 (two) times daily. ) 180 tablet 3  . glipiZIDE (GLUCOTROL) 5 MG tablet Take 2.5 mg by mouth.    . insulin aspart protamine- aspart (NOVOLOG MIX 70/30) (70-30) 100 UNIT/ML injection Inject into the skin.    Marland Kitchen losartan (COZAAR) 50 MG tablet Take 1 tablet (50 mg total) by mouth daily. 30 tablet 6  . NITROSTAT 0.4 MG SL tablet Place 0.4 mg under the tongue every 5 (five) minutes as needed for chest pain (MAX 3 TABLETS).     . NON FORMULARY Needles, syringes with needles, glucose reagent test strips    . Omega-3 Fatty Acids (FISH OIL PO) Take 2 tablets by mouth 2 (two) times daily.     Marland Kitchen omeprazole (PRILOSEC) 20 MG capsule Take 40 mg by mouth.    . Saw Palmetto, Serenoa repens, (SAW PALMETTO PO) Take 1 tablet by mouth daily.     . sertraline (ZOLOFT) 50 MG tablet Take 50 mg  by mouth daily.     Marland Kitchen VITAMIN E PO Take 1 tablet by mouth daily.      No current facility-administered medications for this visit.     REVIEW OF SYSTEMS:   [X]  denotes positive finding, [ ]  denotes negative finding Cardiac  Comments:  Chest pain or chest pressure:    Shortness of breath upon exertion:    Short of breath when lying flat:    Irregular heart rhythm:        Vascular    Pain in calf, thigh, or hip brought on by ambulation:    Pain in feet at night that wakes you up from your sleep:     Blood clot in your veins:    Leg swelling:         Pulmonary    Oxygen at home:    Productive cough:     Wheezing:         Neurologic    Sudden weakness in arms or legs:     Sudden numbness in arms or legs:     Sudden onset of difficulty speaking or slurred speech:    Temporary loss of vision in one eye:     Problems with dizziness:         Gastrointestinal    Blood in stool:      Vomited blood:         Genitourinary    Burning when urinating:     Blood in urine:        Psychiatric    Major depression:         Hematologic    Bleeding problems:    Problems with blood clotting too easily:        Skin    Rashes or ulcers:        Constitutional    Fever or chills:     PHYSICAL EXAM:   Vitals:   03/05/16 1553 03/05/16 1556  BP: (!) 144/72 (!) 143/73  Pulse: (!) 49   Resp: 16   Temp: 97 F (36.1 C)   TempSrc: Oral   SpO2: 99%   Weight: 187 lb (84.8 kg)   Height: 5\' 10"  (1.778 m)     GENERAL: The patient is a well-nourished male, in no acute distress. The vital signs are documented above. CARDIAC: There is a regular rate and rhythm.  VASCULAR: No carotid  bruits. PULMONARY: Nonlabored respirations MUSCULOSKELETAL: There are no major deformities or cyanosis. NEUROLOGIC: No focal weakness or paresthesias are detected. SKIN: There are no ulcers or rashes noted. PSYCHIATRIC: The patient has a normal affect.  STUDIES:   Ordered and reviewed his carotid  duplex which shows less than 40% bilateral internal carotid artery stenosis I used the sono site to identified the carotid bifurcation which is in the mid neck.  ASSESSMENT and PLAN   Congestive heart failure: After reviewing the patient's carotid duplex and evaluating the bifurcation myself with the SonoSite, I feel that the patient is a good candidate for device implant on the right side.  I discussed the details of the procedure with the patient.  All his questions were answered.  He will need to be off of his Eliquis   Annamarie Major, MD [facility] [phone] Pager [phone]

## 2016-03-05 NOTE — Patient Instructions (Signed)
Increase lasix to 40 mg twice a day  Continue your other therapy  I will see you in 3 months.

## 2016-03-06 ENCOUNTER — Other Ambulatory Visit: Payer: Self-pay

## 2016-03-06 ENCOUNTER — Telehealth: Payer: Self-pay

## 2016-03-06 NOTE — Telephone Encounter (Signed)
Spoke to wife and she will relay message regarding BeAT-HF Baseline visit. Schedule for 03-07-16 10am. States she was with her husband at both Dr. Martinique and Dr. Trula Slade visits yesterday. She verbalized "we are both happy Gopal can participate in the study." Aware that potential Barostim surgery date will be Jan 9th.

## 2016-03-07 DIAGNOSIS — Z006 Encounter for examination for normal comparison and control in clinical research program: Secondary | ICD-10-CM

## 2016-03-07 NOTE — Progress Notes (Signed)
Patient present today with his wife for BeAT-HF Baseline visit. He states he was seen by Dr. Martinique on Monday and his Furosemide was increased to 40mg  twice daily. He states "my breathing is better today but my abdomen feels full of fluid." Vitals WNL's. Weight remain increased despite increasing Furosemide on Monday. He verbalized he will call Dr. Doug Sou office if heart failure symptoms worsen. Also seen by Dr. Trula Slade, BeAT-HF Study surgeon on Monday and informed his carotid arteries look good to participate in the study. Labs, study questionnaire and 6 minute hall walk completed. Patient will await call from writer after data is entered into [facility] and randomization is complete. He verbalized "I am happy to be in the study and I am hoping for a device." Reassured and reminded the medically management arm of the study is equally important and [employer] will provide a Barostim device to those who participate in the medial management arm of the study; when this study is complete.

## 2016-03-12 ENCOUNTER — Ambulatory Visit (INDEPENDENT_AMBULATORY_CARE_PROVIDER_SITE_OTHER): Payer: Medicare HMO | Admitting: *Deleted

## 2016-03-12 DIAGNOSIS — I5022 Chronic systolic (congestive) heart failure: Secondary | ICD-10-CM

## 2016-03-12 DIAGNOSIS — Z95810 Presence of automatic (implantable) cardiac defibrillator: Secondary | ICD-10-CM

## 2016-03-12 DIAGNOSIS — M25571 Pain in right ankle and joints of right foot: Secondary | ICD-10-CM | POA: Diagnosis not present

## 2016-03-12 DIAGNOSIS — I255 Ischemic cardiomyopathy: Secondary | ICD-10-CM

## 2016-03-12 DIAGNOSIS — S82831A Other fracture of upper and lower end of right fibula, initial encounter for closed fracture: Secondary | ICD-10-CM | POA: Diagnosis not present

## 2016-03-12 NOTE — Progress Notes (Signed)
Spoke with wife. She stated patient is feeling better since Dr Martinique increased Furosemide.  He is not having fluid symptoms today.  Encouraged to call for fluid symptoms and next ICM remote transmission 04/03/2016.

## 2016-03-12 NOTE — Progress Notes (Signed)
Remote ICD transmission.   

## 2016-03-12 NOTE — Progress Notes (Signed)
EPIC Encounter for ICM Monitoring  Patient Name: Nathaniel Bolton is a 76 y.o. male Date: 03/12/2016 Primary Care Physican: Laverna Peace, NP Primary [place] Electrophysiologist: [place] Nephrologist: Dr. Gifford Shave- [facility]       Attempted call to wife and unable to reach.  Left message to return call.  Transmission reviewed.    Thoracic impedance improved and just below baseline..  Prescribed dosage: Furosemide 40 mg 1 tablet bid (dose increase on 03/05/2016 by Dr Martinique)  Labs  12/26/2017Creatinine 1.52, BUN 23, Potassium 4.5, Sodium 144, EGFR 44-51 12/19/2015 Creatinine 1.36, BUN 24, Potassium 4.7, Sodium 138, EGFR 51-59 11/21/2015 Creatinine 1.61, BUN 25, Potassium 4.7, Sodium 143 10/12/2015 Creatinine 1.37, BUN 26, Potassium 4.7, Sodium 143 09/15/2015 Creatinine 1.28, BUN 24, Potassium 4.2, Sodium 139 08/21/2017Creatinine 1.31, BUN 19, Potassium 4.5, Sodium 141, GFR 54-62 - obtained from [name]'s office  08/05/2015 Creatinine 1.61, BUN 24, Potassium 4.5, Sodium 139, GFR 42-48 07/24/2015 Creatinine 3.31, BUN 45, Potassium 4.7, Sodium 139, GFR 18-22 07/23/2015 Creatinine 4.05, BUN 54, Potassium 4.2, Sodium 140, GFR 15-18 07/22/2015 Creatinine 4.87, BUN 58, Potassium 4.2, Sodium 136, GFR 12-14 07/21/2015 Creatinine 4.58, BUN 52, Potassium 4.4, Sodium 133, GFR 13-15 07/20/2015 Creatinine 3.45, BUN 42, Potassium 3.9, Sodium 135, GFR 18-21  07/19/2015 Creatinine 1.71, BUN 28, Potassium 3.9, Sodium 138, GFR 39-48 07/18/2015 Creatinine 1.44, BUN 26, Potassium 3.9, Sodium 140, GFR 48-58 07/17/2015 Creatinine 1.28, BUN 23, Potassium 3.5, Sodium 141, GFR 55->60  07/16/2015 Creatinine 1.35, BUN 24, Potassium 3.9, Sodium 137, GFR 55->60 07/15/2015 Creatinine 1.13, BUN 25, Potassium 3.5, Sodium 141, GFR >60 03/02/2015 Creatinine 1.58, BUN 34, Potassium 4.5, Sodium135 11/22/2014 Creatinine 1.62, BUN 44, Potassium 4.7, Sodium137 10/14/2014 Creatinine 1.39,  BUN 35, Potassium, 5.2, Sodium135 03/22/2014 Creatinine 1.56 , BUN 33, Potassium 4.5, Sodium135  Recommendations: NONE - Unable to reach patient   Follow-up plan: ICM clinic phone appointment on 04/03/2016.  Copy of ICM check sent to primary cardiologist and device physician.   3 month ICM trend: 03/12/2016    1 Year ICM trend:      [name], RN 03/12/2016 11:35 AM

## 2016-03-13 ENCOUNTER — Encounter: Payer: Self-pay | Admitting: Cardiology

## 2016-03-13 LAB — CUP PACEART REMOTE DEVICE CHECK
Battery Voltage: 2.62 V
Brady Statistic RV Percent Paced: 0.69 %
Date Time Interrogation Session: 20180305051708
HighPow Impedance: 37 Ohm
HighPow Impedance: 46 Ohm
Implantable Lead Location: 753860
Implantable Lead Model: 6947
Implantable Pulse Generator Implant Date: 20091217
Lead Channel Impedance Value: 323 Ohm
Lead Channel Pacing Threshold Pulse Width: 0.4 ms
Lead Channel Setting Pacing Amplitude: 2.5 V
Lead Channel Setting Pacing Pulse Width: 0.4 ms
MDC IDC LEAD IMPLANT DT: 20091217
MDC IDC MSMT LEADCHNL RV PACING THRESHOLD AMPLITUDE: 1.125 V
MDC IDC MSMT LEADCHNL RV SENSING INTR AMPL: 6.5 mV
MDC IDC MSMT LEADCHNL RV SENSING INTR AMPL: 6.5 mV
MDC IDC SET LEADCHNL RV SENSING SENSITIVITY: 0.3 mV

## 2016-03-14 ENCOUNTER — Encounter (HOSPITAL_COMMUNITY): Payer: Self-pay

## 2016-03-14 ENCOUNTER — Encounter (HOSPITAL_COMMUNITY)
Admission: RE | Admit: 2016-03-14 | Discharge: 2016-03-14 | Disposition: A | Discharge status: Home / Self Care | Payer: Medicare HMO | Source: Ambulatory Visit | Attending: Surgery | Admitting: Surgery

## 2016-03-14 DIAGNOSIS — Z01810 Encounter for preprocedural cardiovascular examination: Secondary | ICD-10-CM | POA: Insufficient documentation

## 2016-03-14 DIAGNOSIS — Z01812 Encounter for preprocedural laboratory examination: Secondary | ICD-10-CM | POA: Insufficient documentation

## 2016-03-14 LAB — CBC
HCT: 36.5 % — ABNORMAL LOW (ref 39.0–52.0)
Hemoglobin: 11.9 g/dL — ABNORMAL LOW (ref 13.0–17.0)
MCH: 32.6 pg (ref 26.0–34.0)
MCHC: 32.6 g/dL (ref 30.0–36.0)
MCV: 100 fL (ref 78.0–100.0)
PLATELETS: 204 10*3/uL (ref 150–400)
RBC: 3.65 MIL/uL — ABNORMAL LOW (ref 4.22–5.81)
RDW: 13.9 % (ref 11.5–15.5)
WBC: 7.2 10*3/uL (ref 4.0–10.5)

## 2016-03-14 LAB — TYPE AND SCREEN
ABO/RH(D): A POS
Antibody Screen: NEGATIVE

## 2016-03-14 LAB — SURGICAL PCR SCREEN
MRSA, PCR: NEGATIVE
STAPHYLOCOCCUS AUREUS: NEGATIVE

## 2016-03-14 LAB — PROTIME-INR
INR: 1.08
PROTHROMBIN TIME: 14 s (ref 11.4–15.2)

## 2016-03-14 LAB — GLUCOSE, CAPILLARY: GLUCOSE-CAPILLARY: 164 mg/dL — AB (ref 65–99)

## 2016-03-14 LAB — URINALYSIS, ROUTINE W REFLEX MICROSCOPIC
BACTERIA UA: NONE SEEN
BILIRUBIN URINE: NEGATIVE
Glucose, UA: NEGATIVE mg/dL
Hgb urine dipstick: NEGATIVE
KETONES UR: NEGATIVE mg/dL
NITRITE: NEGATIVE
PROTEIN: NEGATIVE mg/dL
RBC / HPF: NONE SEEN RBC/hpf (ref 0–5)
SQUAMOUS EPITHELIAL / LPF: NONE SEEN
Specific Gravity, Urine: 1.01 (ref 1.005–1.030)
pH: 6 (ref 5.0–8.0)

## 2016-03-14 LAB — COMPREHENSIVE METABOLIC PANEL
ALT: 20 U/L (ref 17–63)
AST: 25 U/L (ref 15–41)
Albumin: 3.5 g/dL (ref 3.5–5.0)
Alkaline Phosphatase: 103 U/L (ref 38–126)
Anion gap: 9 (ref 5–15)
BILIRUBIN TOTAL: 0.7 mg/dL (ref 0.3–1.2)
BUN: 35 mg/dL — AB (ref 6–20)
CO2: 28 mmol/L (ref 22–32)
CREATININE: 1.54 mg/dL — AB (ref 0.61–1.24)
Calcium: 9.2 mg/dL (ref 8.9–10.3)
Chloride: 103 mmol/L (ref 101–111)
GFR, EST AFRICAN AMERICAN: 50 mL/min — AB (ref >60)
GFR, EST NON AFRICAN AMERICAN: 43 mL/min — AB (ref >60)
Glucose, Bld: 170 mg/dL — ABNORMAL HIGH (ref 65–99)
POTASSIUM: 4.2 mmol/L (ref 3.5–5.1)
Sodium: 140 mmol/L (ref 135–145)
TOTAL PROTEIN: 7.1 g/dL (ref 6.5–8.1)

## 2016-03-14 LAB — APTT: aPTT: 33 seconds (ref 24–36)

## 2016-03-14 LAB — ABO/RH: ABO/RH(D): A POS

## 2016-03-14 NOTE — Pre-Procedure Instructions (Signed)
Nathaniel Bolton  03/14/2016      [facility] [address], [place] [address] [place] [address] Phone: [phone] Fax: [phone]    Your procedure is scheduled on Fri. Mar. 9 @ 0900  Report to [facility] at 700 AM.  Call this number if you have problems the morning of surgery:  [phone]   Remember:  Do not eat food or drink liquids after midnight.   Take these medicines the morning of surgery with A SIP OF WATER amiodarone (pacerone), carvedilol (coreg), omeprazole (prilosec), sertraline (zoloft).   Take Eliquis as instructed by your surgeon.  7 days prior to surgery STOP taking any Aspirin, Aleve, Naproxen, Ibuprofen, Motrin, Advil, Goody's, BC's, all herbal medications, fish oil, and all vitamins   How to Manage Your Diabetes Before and After Surgery  Why is it important to control my blood sugar before and after surgery? . Improving blood sugar levels before and after surgery helps healing and can limit problems. . A way of improving blood sugar control is eating a healthy diet by: o  Eating less sugar and carbohydrates o  Increasing activity/exercise o  Talking with your doctor about reaching your blood sugar goals . High blood sugars (greater than 180 mg/dL) can raise your risk of infections and slow your recovery, so you will need to focus on controlling your diabetes during the weeks before surgery. . Make sure that the doctor who takes care of your diabetes knows about your planned surgery including the date and location.  How do I manage my blood sugar before surgery? . Check your blood sugar at least 4 times a day, starting 2 days before surgery, to make sure that the level is not too high or low. o Check your blood sugar the morning of your surgery when you wake up and every 2 hours until you get to the Short Stay unit. . If your blood sugar is less than 70 mg/dL, you will need to treat for  low blood sugar: o Do not take insulin. o Treat a low blood sugar (less than 70 mg/dL) with  cup of clear juice (cranberry or apple), 4 glucose tablets, OR glucose gel. o Recheck blood sugar in 15 minutes after treatment (to make sure it is greater than 70 mg/dL). If your blood sugar is not greater than 70 mg/dL on recheck, call [phone] for further instructions. . Report your blood sugar to the short stay nurse when you get to Short Stay.  . If you are admitted to the hospital after surgery: o Your blood sugar will be checked by the staff and you will probably be given insulin after surgery (instead of oral diabetes medicines) to make sure you have good blood sugar levels. o The goal for blood sugar control after surgery is 80-180 mg/dL.    WHAT DO I DO ABOUT MY DIABETES MEDICATION?   Marland Kitchen Do not take oral diabetes medicines (pills) the morning of surgery. glipiZIDE (GLUCOTROL)  . THE DAY BEFORE SURGERY, take ______70%_____ of insulin aspart protamine- aspart (NOVOLOG MIX 70/30) if blood sugar is greater than 150      . tHE MORNING OF SURGERY, DO NOT  take insulin aspart protamine- aspart (NOVOLOG MIX 70/30)  . The day of surgery, do not take other diabetes injectables, including Byetta (exenatide), Bydureon (exenatide ER), Victoza (liraglutide), or Trulicity (dulaglutide).  . If your CBG is greater than 220 mg/dL, you may  take  of your sliding scale (correction) dose of insulin.     Do not wear jewelry.  Do not wear lotions, powders, or cologne, or deoderant.  Men may shave face and neck.  Do not bring valuables to the hospital.  [facility] is not responsible for any belongings or valuables.  Contacts, dentures or bridgework may not be worn into surgery.  Leave your suitcase in the car.  After surgery it may be brought to your room.  For patients admitted to the hospital, discharge time will be determined by your treatment team.  Patients discharged the day of surgery will  not be allowed to drive home.    Special instructions:  - Preparing For Surgery  Before surgery, you can play an important role. Because skin is not sterile, your skin needs to be as free of germs as possible. You can reduce the number of germs on your skin by washing with CHG (chlorahexidine gluconate) Soap before surgery.  CHG is an antiseptic cleaner which kills germs and bonds with the skin to continue killing germs even after washing.  Please do not use if you have an allergy to CHG or antibacterial soaps. If your skin becomes reddened/irritated stop using the CHG.  Do not shave (including legs and underarms) for at least 48 hours prior to first CHG shower. It is OK to shave your face.  Please follow these instructions carefully.   1. Shower the NIGHT BEFORE SURGERY and the MORNING OF SURGERY with CHG.   2. If you chose to wash your hair, wash your hair first as usual with your normal shampoo.  3. After you shampoo, rinse your hair and body thoroughly to remove the shampoo.  4. Use CHG as you would any other liquid soap. You can apply CHG directly to the skin and wash gently with a scrungie or a clean washcloth.   5. Apply the CHG Soap to your body ONLY FROM THE NECK DOWN.  Do not use on open wounds or open sores. Avoid contact with your eyes, ears, mouth and genitals (private parts). Wash genitals (private parts) with your normal soap.  6. Wash thoroughly, paying special attention to the area where your surgery will be performed.  7. Thoroughly rinse your body with warm water from the neck down.  8. DO NOT shower/wash with your normal soap after using and rinsing off the CHG Soap.  9. Pat yourself dry with a CLEAN TOWEL.   10. Wear CLEAN PAJAMAS   11. Place CLEAN SHEETS on your bed the night of your first shower and DO NOT SLEEP WITH PETS.    Day of Surgery: Do not apply any deodorants/lotions. Please wear clean clothes to the hospital/surgery center.       Please read over the following fact sheets that you were given. Pain Booklet, Coughing and Deep Breathing, MRSA Information and Surgical Site Infection Prevention

## 2016-03-14 NOTE — Progress Notes (Addendum)
PCP - [place] Cardiologist - Peter Martinique cleared for surgery 03/05/16  Chest x-ray - not needed EKG - 09/19/15 Stress Test - denies ECHO - 02/20/16 Cardiac Cath - 07/2015 - care everywhere [facility]   Fasting Blood Sugar - 75-116 Checks Blood Sugar __4___ times a day  Last dose of eliquis was march 1   Will send to anesthesia for review  Patient denies shortness of breath, fever, cough and chest pain at PAT appointment   Patient verbalized understanding of instructions that was given to them at the PAT appointment. Patient expressed that there were no further questions.  Patient was also instructed that they will need to review over the PAT instructions again at home before the surgery.

## 2016-03-14 NOTE — Progress Notes (Addendum)
Anesthesia Chart Review:  Pt is a 76 year old male scheduled for insertion of barostim on 03/16/2016 with Harold Barban, MD.   - Cardiologist is Peter Martinique, MD, last office visit 03/05/16 who is aware of upcoming procedure - EP cardiologist is Thompson Grayer, MD, last office visit 04/16/15 (1 year f/u recommended) - PCP is Laverna Peace, NP - Nephrologist is Gifford Shave, MD   PMH includes:  CHF, ischemic cardiomyopathy, ICD, PAF, CAD (MI 1980, 1990; s/p CABG), HTN, DM, hyperlipidemia, CKD (stage 3), stroke (2013), ventricular tachycardia. Former smoker. BMI 25.5  Medications include: amiodarone, eliquis, Lipitor, carvedilol, Lasix, glipizide, NovoLog mix 70/30, losartan, Prilosec  Preoperative labs reviewed.   - Cr 1.54, BUN 35, consistent with prior results - glucose 170, HbA1c pending.   EKG 09/19/15: sinus bradycardia (55 bpm). LBBB.   Carotid duplex 03/05/16: <40% B ICA stenosis  Echo 02/20/16:  - Left ventricle: LVEF is severely depressed at approximately 30% with severe hypokinesis of the anterior, anterolateral, inferior, inferolateral walls. The cavity size was moderately dilated. Wall thickness was normal. - Mitral valve: There was mild to moderate regurgitation. Valve area by pressure half-time: 2.42 cm^2. Valve area by continuity equation (using LVOT flow): 0.99 cm^2. - Left atrium: The atrium was severely dilated. - Right ventricle: Systolic function was moderately reduced. - Right atrium: The atrium was moderately dilated.  Cardiac cath 07/18/15 (care everywhere): 1. Severe calcified Distal LM and Ostial Cir lesion. 2. LIMA to Diag 3 is patent 3. SVG to Cir occluded 4. SVG to RCA patent 5. Severe LV dysfunction, LVEF 20% -- Note by Cathren Laine, MD with cardiology 08/05/15 in care everywhere indicate pt ICD fired twice and pt experienced syncope, found to be in [place] at ER at [facility], transferred to [facility]. Cath results discussed with Dr. Roxy Manns who did original CABG  for consideration of redo CABG. Pt elected medical management.   Cardiac monitor 04/05/15:  - Predominant rhythm is sinus rhythm - Baseline artifact limits interpretation - There appears to be coarse atrial fibrillation observed 03/19/15 at 1:09 am with controlled ventricular rate, though sinus with extensive artifact cannot be completely excluded. - Rare PVCs - No other arrhythmias noted  If no changes, I anticipate pt can proceed with surgery as scheduled.   Willeen Cass, FNP-BC [facility]/Anesthesiology Phone: [phone] 03/14/2016 4:42 PM

## 2016-03-15 ENCOUNTER — Encounter (HOSPITAL_COMMUNITY): Payer: Self-pay | Admitting: Anesthesiology

## 2016-03-15 LAB — HEMOGLOBIN A1C
HEMOGLOBIN A1C: 6.3 % — AB (ref 4.8–5.6)
Mean Plasma Glucose: 134 mg/dL

## 2016-03-16 ENCOUNTER — Encounter (HOSPITAL_COMMUNITY)
Admission: RE | Disposition: A | Discharge status: Home / Self Care | Payer: Self-pay | Source: Ambulatory Visit | Attending: Surgery

## 2016-03-16 ENCOUNTER — Inpatient Hospital Stay (HOSPITAL_COMMUNITY): Payer: Self-pay | Admitting: Anesthesiology

## 2016-03-16 ENCOUNTER — Inpatient Hospital Stay (HOSPITAL_COMMUNITY): Payer: Self-pay | Admitting: Emergency Medicine

## 2016-03-16 ENCOUNTER — Inpatient Hospital Stay (HOSPITAL_COMMUNITY)
Admission: RE | Admit: 2016-03-16 | Discharge: 2016-03-17 | DRG: 253 | Disposition: A | Discharge status: Home / Self Care | Payer: Self-pay | Source: Ambulatory Visit | Attending: Surgery | Admitting: Surgery

## 2016-03-16 DIAGNOSIS — I130 Hypertensive heart and chronic kidney disease with heart failure and stage 1 through stage 4 chronic kidney disease, or unspecified chronic kidney disease: Principal | ICD-10-CM | POA: Diagnosis present

## 2016-03-16 DIAGNOSIS — E1142 Type 2 diabetes mellitus with diabetic polyneuropathy: Secondary | ICD-10-CM | POA: Diagnosis present

## 2016-03-16 DIAGNOSIS — N183 Chronic kidney disease, stage 3 (moderate): Secondary | ICD-10-CM | POA: Diagnosis present

## 2016-03-16 DIAGNOSIS — Z8673 Personal history of transient ischemic attack (TIA), and cerebral infarction without residual deficits: Secondary | ICD-10-CM

## 2016-03-16 DIAGNOSIS — E7800 Pure hypercholesterolemia, unspecified: Secondary | ICD-10-CM | POA: Diagnosis present

## 2016-03-16 DIAGNOSIS — Z87891 Personal history of nicotine dependence: Secondary | ICD-10-CM

## 2016-03-16 DIAGNOSIS — E785 Hyperlipidemia, unspecified: Secondary | ICD-10-CM | POA: Diagnosis present

## 2016-03-16 DIAGNOSIS — I5022 Chronic systolic (congestive) heart failure: Secondary | ICD-10-CM

## 2016-03-16 DIAGNOSIS — I509 Heart failure, unspecified: Secondary | ICD-10-CM

## 2016-03-16 DIAGNOSIS — M109 Gout, unspecified: Secondary | ICD-10-CM | POA: Diagnosis present

## 2016-03-16 DIAGNOSIS — I2510 Atherosclerotic heart disease of native coronary artery without angina pectoris: Secondary | ICD-10-CM | POA: Diagnosis present

## 2016-03-16 DIAGNOSIS — E1122 Type 2 diabetes mellitus with diabetic chronic kidney disease: Secondary | ICD-10-CM | POA: Diagnosis present

## 2016-03-16 DIAGNOSIS — I252 Old myocardial infarction: Secondary | ICD-10-CM

## 2016-03-16 DIAGNOSIS — I480 Paroxysmal atrial fibrillation: Secondary | ICD-10-CM | POA: Diagnosis present

## 2016-03-16 DIAGNOSIS — Z794 Long term (current) use of insulin: Secondary | ICD-10-CM

## 2016-03-16 DIAGNOSIS — J449 Chronic obstructive pulmonary disease, unspecified: Secondary | ICD-10-CM | POA: Diagnosis present

## 2016-03-16 DIAGNOSIS — Z951 Presence of aortocoronary bypass graft: Secondary | ICD-10-CM

## 2016-03-16 DIAGNOSIS — Z79899 Other long term (current) drug therapy: Secondary | ICD-10-CM

## 2016-03-16 DIAGNOSIS — Z7901 Long term (current) use of anticoagulants: Secondary | ICD-10-CM

## 2016-03-16 DIAGNOSIS — E1159 Type 2 diabetes mellitus with other circulatory complications: Secondary | ICD-10-CM | POA: Diagnosis present

## 2016-03-16 DIAGNOSIS — Z95810 Presence of automatic (implantable) cardiac defibrillator: Secondary | ICD-10-CM

## 2016-03-16 LAB — GLUCOSE, CAPILLARY
GLUCOSE-CAPILLARY: 115 mg/dL — AB (ref 65–99)
GLUCOSE-CAPILLARY: 185 mg/dL — AB (ref 65–99)
Glucose-Capillary: 118 mg/dL — ABNORMAL HIGH (ref 65–99)
Glucose-Capillary: 165 mg/dL — ABNORMAL HIGH (ref 65–99)
Glucose-Capillary: 174 mg/dL — ABNORMAL HIGH (ref 65–99)

## 2016-03-16 SURGERY — INSERTION, CAROTID SINUS BAROREFLEX ACTIVATION DEVICE
Anesthesia: General | Site: Chest | Laterality: Right

## 2016-03-16 MED ORDER — LABETALOL HCL 5 MG/ML IV SOLN: 10.0000 mg | INTRAVENOUS | Status: DC | PRN

## 2016-03-16 MED ORDER — SODIUM CHLORIDE 0.9 % IR SOLN
Status: DC | PRN
  Administered 2016-03-16: 500 mL

## 2016-03-16 MED ORDER — PHENOL 1.4 % MT LIQD: 1.0000 | OROMUCOSAL | Status: DC | PRN

## 2016-03-16 MED ORDER — ASPIRIN EC 325 MG PO TBEC
325.0000 mg | DELAYED_RELEASE_TABLET | Freq: Every day | ORAL | Status: DC
  Administered 2016-03-16 – 2016-03-17 (×2): 325 mg via ORAL
  Filled 2016-03-16 (×2): qty 1

## 2016-03-16 MED ORDER — CEFUROXIME SODIUM 1.5 G IJ SOLR
1.5000 g | INTRAMUSCULAR | Status: AC
  Administered 2016-03-16: 1.5 g via INTRAVENOUS

## 2016-03-16 MED ORDER — PANTOPRAZOLE SODIUM 40 MG PO TBEC
40.0000 mg | DELAYED_RELEASE_TABLET | Freq: Every day | ORAL | Status: DC
  Administered 2016-03-17: 40 mg via ORAL
  Filled 2016-03-16: qty 1

## 2016-03-16 MED ORDER — FENTANYL CITRATE (PF) 100 MCG/2ML IJ SOLN
INTRAMUSCULAR | Status: DC | PRN
  Administered 2016-03-16: 50 ug via INTRAVENOUS

## 2016-03-16 MED ORDER — OXYCODONE HCL 5 MG PO TABS: 5.0000 mg | ORAL_TABLET | Freq: Once | ORAL | Status: DC | PRN

## 2016-03-16 MED ORDER — FENTANYL CITRATE (PF) 100 MCG/2ML IJ SOLN
INTRAMUSCULAR | Status: AC
  Filled 2016-03-16: qty 4

## 2016-03-16 MED ORDER — MIDAZOLAM HCL 2 MG/2ML IJ SOLN
INTRAMUSCULAR | Status: AC
  Filled 2016-03-16: qty 2

## 2016-03-16 MED ORDER — OXYCODONE-ACETAMINOPHEN 5-325 MG PO TABS
1.0000 | ORAL_TABLET | ORAL | Status: DC | PRN
  Administered 2016-03-16 – 2016-03-17 (×2): 1 via ORAL
  Filled 2016-03-16 (×2): qty 1

## 2016-03-16 MED ORDER — NITROGLYCERIN 0.4 MG SL SUBL: 0.4000 mg | SUBLINGUAL_TABLET | SUBLINGUAL | Status: DC | PRN

## 2016-03-16 MED ORDER — POLYETHYLENE GLYCOL 3350 17 G PO PACK: 17.0000 g | PACK | Freq: Every day | ORAL | Status: DC | PRN

## 2016-03-16 MED ORDER — FENTANYL CITRATE (PF) 100 MCG/2ML IJ SOLN
25.0000 ug | INTRAMUSCULAR | Status: DC | PRN
  Administered 2016-03-16 (×2): 50 ug via INTRAVENOUS

## 2016-03-16 MED ORDER — CHLORHEXIDINE GLUCONATE CLOTH 2 % EX PADS: 6.0000 | MEDICATED_PAD | Freq: Once | CUTANEOUS | Status: DC

## 2016-03-16 MED ORDER — LIDOCAINE 2% (20 MG/ML) 5 ML SYRINGE
INTRAMUSCULAR | Status: DC | PRN
  Administered 2016-03-16: 60 mg via INTRAVENOUS

## 2016-03-16 MED ORDER — SODIUM CHLORIDE 0.9 % IJ SOLN
INTRAMUSCULAR | Status: AC
  Filled 2016-03-16: qty 10

## 2016-03-16 MED ORDER — DEXTROSE 5 % IV SOLN
1.5000 g | Freq: Two times a day (BID) | INTRAVENOUS | Status: AC
  Administered 2016-03-16 – 2016-03-17 (×2): 1.5 g via INTRAVENOUS
  Filled 2016-03-16 (×3): qty 1.5

## 2016-03-16 MED ORDER — FUROSEMIDE 40 MG PO TABS
40.0000 mg | ORAL_TABLET | Freq: Two times a day (BID) | ORAL | Status: DC
  Administered 2016-03-16 – 2016-03-17 (×2): 40 mg via ORAL
  Filled 2016-03-16 (×2): qty 1

## 2016-03-16 MED ORDER — ETOMIDATE 2 MG/ML IV SOLN
INTRAVENOUS | Status: AC
  Filled 2016-03-16: qty 10

## 2016-03-16 MED ORDER — SODIUM CHLORIDE 0.9 % IV SOLN
INTRAVENOUS | Status: DC
  Administered 2016-03-16: 1000 mL via INTRAVENOUS

## 2016-03-16 MED ORDER — ETOMIDATE 2 MG/ML IV SOLN
INTRAVENOUS | Status: DC | PRN
  Administered 2016-03-16: 10 mg via INTRAVENOUS
  Administered 2016-03-16: 4 mg via INTRAVENOUS

## 2016-03-16 MED ORDER — ROCURONIUM BROMIDE 50 MG/5ML IV SOSY
PREFILLED_SYRINGE | INTRAVENOUS | Status: AC
  Filled 2016-03-16: qty 5

## 2016-03-16 MED ORDER — ONDANSETRON HCL 4 MG/2ML IJ SOLN
INTRAMUSCULAR | Status: AC
  Filled 2016-03-16: qty 2

## 2016-03-16 MED ORDER — MIDAZOLAM HCL 2 MG/2ML IJ SOLN
INTRAMUSCULAR | Status: DC | PRN
  Administered 2016-03-16 (×2): 1 mg via INTRAVENOUS

## 2016-03-16 MED ORDER — ACETAMINOPHEN 650 MG RE SUPP: 325.0000 mg | RECTAL | Status: DC | PRN

## 2016-03-16 MED ORDER — GLIPIZIDE 5 MG PO TABS
2.5000 mg | ORAL_TABLET | Freq: Every day | ORAL | Status: DC
  Administered 2016-03-17: 2.5 mg via ORAL
  Filled 2016-03-16: qty 1

## 2016-03-16 MED ORDER — MORPHINE SULFATE (PF) 2 MG/ML IV SOLN
2.0000 mg | INTRAVENOUS | Status: DC | PRN
  Administered 2016-03-16: 2 mg via INTRAVENOUS
  Filled 2016-03-16: qty 1

## 2016-03-16 MED ORDER — SERTRALINE HCL 50 MG PO TABS
50.0000 mg | ORAL_TABLET | Freq: Every day | ORAL | Status: DC
  Administered 2016-03-17: 50 mg via ORAL
  Filled 2016-03-16: qty 1

## 2016-03-16 MED ORDER — ARTIFICIAL TEARS OP OINT
TOPICAL_OINTMENT | OPHTHALMIC | Status: AC
  Filled 2016-03-16: qty 3.5

## 2016-03-16 MED ORDER — DEXAMETHASONE SODIUM PHOSPHATE 10 MG/ML IJ SOLN
INTRAMUSCULAR | Status: AC
  Filled 2016-03-16: qty 1

## 2016-03-16 MED ORDER — PHENYLEPHRINE HCL 10 MG/ML IJ SOLN: INTRAMUSCULAR | Status: DC | PRN

## 2016-03-16 MED ORDER — LIDOCAINE HCL (PF) 0.5 % IJ SOLN
INTRAMUSCULAR | Status: AC
  Filled 2016-03-16: qty 50

## 2016-03-16 MED ORDER — SODIUM CHLORIDE 0.9 % IV SOLN: 500.0000 mL | Freq: Once | INTRAVENOUS | Status: DC | PRN

## 2016-03-16 MED ORDER — DEXTROSE 5 % IV SOLN
INTRAVENOUS | Status: AC
  Filled 2016-03-16: qty 1.5

## 2016-03-16 MED ORDER — POTASSIUM CHLORIDE CRYS ER 20 MEQ PO TBCR: 20.0000 meq | EXTENDED_RELEASE_TABLET | Freq: Every day | ORAL | Status: DC | PRN

## 2016-03-16 MED ORDER — SODIUM CHLORIDE 0.9% FLUSH
INTRAVENOUS | Status: DC | PRN
  Administered 2016-03-16: 10 mL

## 2016-03-16 MED ORDER — ROCURONIUM BROMIDE 100 MG/10ML IV SOLN
INTRAVENOUS | Status: DC | PRN
Start: 2016-03-16 — End: 2016-03-16
  Administered 2016-03-16: 50 mg via INTRAVENOUS

## 2016-03-16 MED ORDER — BISACODYL 10 MG RE SUPP: 10.0000 mg | Freq: Every day | RECTAL | Status: DC | PRN

## 2016-03-16 MED ORDER — PHENYLEPHRINE HCL 10 MG/ML IJ SOLN
INTRAVENOUS | Status: DC | PRN
  Administered 2016-03-16: 35 ug/min via INTRAVENOUS

## 2016-03-16 MED ORDER — SODIUM CHLORIDE 0.9 % IV SOLN
0.0500 ug/kg/min | INTRAVENOUS | Status: DC
  Filled 2016-03-16: qty 5000

## 2016-03-16 MED ORDER — METOPROLOL TARTRATE 5 MG/5ML IV SOLN: 2.0000 mg | INTRAVENOUS | Status: DC | PRN

## 2016-03-16 MED ORDER — SUGAMMADEX SODIUM 200 MG/2ML IV SOLN
INTRAVENOUS | Status: AC
  Filled 2016-03-16: qty 2

## 2016-03-16 MED ORDER — OXYCODONE HCL 5 MG/5ML PO SOLN: 5.0000 mg | Freq: Once | ORAL | Status: DC | PRN

## 2016-03-16 MED ORDER — ONDANSETRON HCL 4 MG/2ML IJ SOLN
4.0000 mg | Freq: Four times a day (QID) | INTRAMUSCULAR | Status: DC | PRN
  Administered 2016-03-16: 4 mg via INTRAVENOUS
  Filled 2016-03-16: qty 2

## 2016-03-16 MED ORDER — VITAMIN D 1000 UNITS PO TABS
1000.0000 [IU] | ORAL_TABLET | Freq: Two times a day (BID) | ORAL | Status: DC
  Administered 2016-03-17: 1000 [IU] via ORAL
  Filled 2016-03-16: qty 1

## 2016-03-16 MED ORDER — BUPIVACAINE HCL 0.5 % IJ SOLN
INTRAMUSCULAR | Status: DC | PRN
  Administered 2016-03-16: 30 mL

## 2016-03-16 MED ORDER — LIDOCAINE 2% (20 MG/ML) 5 ML SYRINGE
INTRAMUSCULAR | Status: AC
  Filled 2016-03-16: qty 5

## 2016-03-16 MED ORDER — LACTATED RINGERS IV SOLN: INTRAVENOUS | Status: DC

## 2016-03-16 MED ORDER — 0.9 % SODIUM CHLORIDE (POUR BTL) OPTIME
TOPICAL | Status: DC | PRN
  Administered 2016-03-16: 1000 mL

## 2016-03-16 MED ORDER — VITAMIN C 500 MG PO TABS
1000.0000 mg | ORAL_TABLET | Freq: Every day | ORAL | Status: DC
  Administered 2016-03-16 – 2016-03-17 (×2): 1000 mg via ORAL
  Filled 2016-03-16 (×2): qty 2

## 2016-03-16 MED ORDER — LIDOCAINE HCL (PF) 1 % IJ SOLN
INTRAMUSCULAR | Status: AC
  Filled 2016-03-16: qty 30

## 2016-03-16 MED ORDER — ONDANSETRON HCL 4 MG/2ML IJ SOLN
INTRAMUSCULAR | Status: DC | PRN
  Administered 2016-03-16: 4 mg via INTRAVENOUS

## 2016-03-16 MED ORDER — MAGNESIUM SULFATE 2 GM/50ML IV SOLN: 2.0000 g | Freq: Every day | INTRAVENOUS | Status: DC | PRN

## 2016-03-16 MED ORDER — ALLOPURINOL 100 MG PO TABS
100.0000 mg | ORAL_TABLET | Freq: Every day | ORAL | Status: DC
  Administered 2016-03-16 – 2016-03-17 (×2): 100 mg via ORAL
  Filled 2016-03-16 (×2): qty 1

## 2016-03-16 MED ORDER — ACETAMINOPHEN 325 MG PO TABS: 325.0000 mg | ORAL_TABLET | ORAL | Status: DC | PRN

## 2016-03-16 MED ORDER — FENTANYL CITRATE (PF) 100 MCG/2ML IJ SOLN
INTRAMUSCULAR | Status: AC
  Filled 2016-03-16: qty 2

## 2016-03-16 MED ORDER — CARVEDILOL 6.25 MG PO TABS
6.2500 mg | ORAL_TABLET | Freq: Two times a day (BID) | ORAL | Status: DC
  Administered 2016-03-16 – 2016-03-17 (×2): 6.25 mg via ORAL
  Filled 2016-03-16 (×2): qty 1

## 2016-03-16 MED ORDER — EPHEDRINE SULFATE-NACL 50-0.9 MG/10ML-% IV SOSY
PREFILLED_SYRINGE | INTRAVENOUS | Status: DC | PRN
Start: 2016-03-16 — End: 2016-03-16
  Administered 2016-03-16: 5 mg via INTRAVENOUS

## 2016-03-16 MED ORDER — LOSARTAN POTASSIUM 50 MG PO TABS
50.0000 mg | ORAL_TABLET | Freq: Every day | ORAL | Status: DC
  Administered 2016-03-16 – 2016-03-17 (×2): 50 mg via ORAL
  Filled 2016-03-16 (×2): qty 1

## 2016-03-16 MED ORDER — ATORVASTATIN CALCIUM 40 MG PO TABS
40.0000 mg | ORAL_TABLET | Freq: Every day | ORAL | Status: DC
  Administered 2016-03-16: 40 mg via ORAL
  Filled 2016-03-16: qty 1

## 2016-03-16 MED ORDER — SODIUM CHLORIDE 0.9 % IV SOLN
0.0500 ug/kg/min | INTRAVENOUS | Status: AC
  Administered 2016-03-16: .2 ug/kg/min via INTRAVENOUS
  Filled 2016-03-16: qty 5000

## 2016-03-16 MED ORDER — VITAMIN E 180 MG (400 UNIT) PO CAPS
400.0000 [IU] | ORAL_CAPSULE | Freq: Every day | ORAL | Status: DC
  Administered 2016-03-17: 400 [IU] via ORAL
  Filled 2016-03-16: qty 1

## 2016-03-16 MED ORDER — GUAIFENESIN-DM 100-10 MG/5ML PO SYRP: 15.0000 mL | ORAL_SOLUTION | ORAL | Status: DC | PRN

## 2016-03-16 MED ORDER — SODIUM CHLORIDE 0.9 % IV SOLN
INTRAVENOUS | Status: DC
  Administered 2016-03-16: 09:00:00 via INTRAVENOUS

## 2016-03-16 MED ORDER — AMIODARONE HCL 200 MG PO TABS
200.0000 mg | ORAL_TABLET | Freq: Every day | ORAL | Status: DC
  Administered 2016-03-17: 200 mg via ORAL
  Filled 2016-03-16: qty 1

## 2016-03-16 MED ORDER — DOCUSATE SODIUM 100 MG PO CAPS
100.0000 mg | ORAL_CAPSULE | Freq: Every day | ORAL | Status: DC
  Administered 2016-03-17: 100 mg via ORAL
  Filled 2016-03-16: qty 1

## 2016-03-16 MED ORDER — HYDRALAZINE HCL 20 MG/ML IJ SOLN: 5.0000 mg | INTRAMUSCULAR | Status: DC | PRN

## 2016-03-16 MED ORDER — SUGAMMADEX SODIUM 200 MG/2ML IV SOLN
INTRAVENOUS | Status: DC | PRN
  Administered 2016-03-16: 160 mg via INTRAVENOUS

## 2016-03-16 SURGICAL SUPPLY — 42 items
ADH SKN CLS APL DERMABOND .7 (GAUZE/BANDAGES/DRESSINGS) ×1
CANISTER SUCT 3000ML PPV (MISCELLANEOUS) ×3 IMPLANT
CHLORAPREP W/TINT 10.5 ML (MISCELLANEOUS) ×2 IMPLANT
CLIP TI MEDIUM 6 (CLIP) IMPLANT
CLIP TI WIDE RED SMALL 6 (CLIP) ×4 IMPLANT
CRADLE DONUT ADULT HEAD (MISCELLANEOUS) ×3 IMPLANT
CVRx Barostim neo- Carotid Sinus Lead ×1 IMPLANT
CVRx Barostim neo- Implantable Impulse Generator ×2 IMPLANT
DERMABOND ADVANCED (GAUZE/BANDAGES/DRESSINGS) ×2
DERMABOND ADVANCED .7 DNX12 (GAUZE/BANDAGES/DRESSINGS) ×1 IMPLANT
ELECT REM PT RETURN 9FT ADLT (ELECTROSURGICAL) ×3
ELECTRODE REM PT RTRN 9FT ADLT (ELECTROSURGICAL) ×1 IMPLANT
GLOVE BIO SURGEON STRL SZ7.5 (GLOVE) ×1 IMPLANT
GLOVE BIOGEL PI IND STRL 7.5 (GLOVE) ×2 IMPLANT
GLOVE BIOGEL PI INDICATOR 7.5 (GLOVE) ×2
GLOVE SURG SS PI 7.0 STRL IVOR (GLOVE) ×6 IMPLANT
GLOVE SURG SS PI 7.5 STRL IVOR (GLOVE) ×5 IMPLANT
GOWN STRL REUS W/ TWL LRG LVL3 (GOWN DISPOSABLE) ×3 IMPLANT
GOWN STRL REUS W/ TWL XL LVL3 (GOWN DISPOSABLE) ×1 IMPLANT
GOWN STRL REUS W/TWL LRG LVL3 (GOWN DISPOSABLE) ×6
GOWN STRL REUS W/TWL XL LVL3 (GOWN DISPOSABLE) ×6
HEMOSTAT SNOW SURGICEL 2X4 (HEMOSTASIS) IMPLANT
IV CATH 18G X1.75 CATHLON (IV SOLUTION) ×3 IMPLANT
KIT BASIN OR (CUSTOM PROCEDURE TRAY) ×3 IMPLANT
KIT ROOM TURNOVER OR (KITS) ×3 IMPLANT
MARKER SKIN DUAL TIP RULER LAB (MISCELLANEOUS) ×5 IMPLANT
NDL HYPO 25GX1X1/2 BEV (NEEDLE) IMPLANT
NEEDLE HYPO 25GX1X1/2 BEV (NEEDLE) ×3 IMPLANT
NS IRRIG 1000ML POUR BTL (IV SOLUTION) ×6 IMPLANT
PACK CAROTID (CUSTOM PROCEDURE TRAY) ×3 IMPLANT
PAD ARMBOARD 7.5X6 YLW CONV (MISCELLANEOUS) ×6 IMPLANT
SUT ETHIBOND CT1 BRD #0 30IN (SUTURE) ×6 IMPLANT
SUT ETHILON 3 0 PS 1 (SUTURE) IMPLANT
SUT PROLENE 6 0 BV (SUTURE) ×24 IMPLANT
SUT SILK 0 FSL (SUTURE) ×4 IMPLANT
SUT VIC AB 3-0 SH 27 (SUTURE) ×9
SUT VIC AB 3-0 SH 27X BRD (SUTURE) ×2 IMPLANT
SUT VICRYL 4-0 PS2 18IN ABS (SUTURE) ×6 IMPLANT
SYR 5ML LL (SYRINGE) ×3 IMPLANT
SYR BULB IRRIGATION 50ML (SYRINGE) IMPLANT
SYR CONTROL 10ML LL (SYRINGE) ×2 IMPLANT
WATER STERILE IRR 1000ML POUR (IV SOLUTION) ×3 IMPLANT

## 2016-03-16 NOTE — H&P (View-Only) (Signed)
[facility]ist [facility] [place]  Patient name: Nathaniel Bolton MRN: [id number] DOB: 10/03/1940 Sex: male   REFERRING PROVIDER:    Dr. Martinique   REASON FOR CONSULT:    Barostim Eval  HISTORY OF PRESENT ILLNESS:   Nathaniel Bolton is a 76 y.o. male, who is referred for carotid sinus stimulator.  He has a history of congestive heart failure with an ejection fraction of 27%.  He has remote history of coronary artery disease.  He has AICD implant secondary to ventricular tachycardia.  He has a history of paroxysmal A. fib and is on Eliquis.  He takes a statin for hypercholesterolemia.  He is medical management hypertension.  PAST MEDICAL HISTORY    Past Medical History:  Diagnosis Date  . CAD (coronary artery disease)   . CKD stage 3 due to type 2 diabetes mellitus ([place])   . Diabetes mellitus    Type 2  . Erectile dysfunction   . Gout   . Hyperlipidemia   . Hypertension   . ICD (implantable cardiac defibrillator) in place   . Myocardial infarction, old    x2 in [place]. S/P CABG 2003, Dr Roxy Manns  . Obstructive lung disease (HCC)    Moderate per prior PFT's  . Paroxysmal atrial fibrillation ([place]) 3/17   discovered on event monitor  . Stroke [facility])    2013  . Systolic CHF, acute on chronic (HCC)    EF is 27% per myoview and echo October 2012  . Ventricular tachycardia ([place]) 08/2014   175 bpm     FAMILY HISTORY   Family History  Problem Relation Age of Onset  . Heart failure Mother   . Heart attack Father   . Heart disease Sister     valve replaced  . Heart attack Brother     CABG, aortic grafting    SOCIAL HISTORY:   Social History   Social History  . Marital status: Married    Spouse name: N/A  . Number of children: 4  . Years of education: N/A   Occupational History  . heavy Radio producer    Social History Main Topics  . Smoking status: Former Smoker    Quit date: 1991  . Smokeless tobacco:  Never Used  . Alcohol use No  . Drug use: No  . Sexual activity: Not on file   Other Topics Concern  . Not on file   Social History Narrative  . No narrative on file    ALLERGIES:    Allergies  Allergen Reactions  . Adhesive [Tape] Itching and Rash    Testosterone patch adhesive    CURRENT MEDICATIONS:    Current Outpatient Prescriptions  Medication Sig Dispense Refill  . allopurinol (ZYLOPRIM) 100 MG tablet Take 100 mg by mouth.    Marland Kitchen amiodarone (PACERONE) 200 MG tablet Take 1 tablet (200 mg total) by mouth daily. 90 tablet 3  . apixaban (ELIQUIS) 5 MG TABS tablet Take 1 tablet (5 mg total) by mouth 2 (two) times daily. 60 tablet 0  . Ascorbic Acid (VITAMIN C) 1000 MG tablet Take 1,000 mg by mouth daily.    Marland Kitchen atorvastatin (LIPITOR) 40 MG tablet Take 40 mg by mouth daily at 6 PM.     . carvedilol (COREG) 6.25 MG tablet Take 1 tablet by mouth 2 (two) times daily with a meal.     . Cholecalciferol (VITAMIN D3) 2000 units TABS Take 1 tablet by mouth 2 (two) times daily.    Marland Kitchen  furosemide (LASIX) 40 MG tablet Take 1 tablet (40 mg total) by mouth 2 (two) times daily. (Patient taking differently: Take 80 mg by mouth 2 (two) times daily. ) 180 tablet 3  . glipiZIDE (GLUCOTROL) 5 MG tablet Take 2.5 mg by mouth.    . insulin aspart protamine- aspart (NOVOLOG MIX 70/30) (70-30) 100 UNIT/ML injection Inject into the skin.    Marland Kitchen losartan (COZAAR) 50 MG tablet Take 1 tablet (50 mg total) by mouth daily. 30 tablet 6  . NITROSTAT 0.4 MG SL tablet Place 0.4 mg under the tongue every 5 (five) minutes as needed for chest pain (MAX 3 TABLETS).     . NON FORMULARY Needles, syringes with needles, glucose reagent test strips    . Omega-3 Fatty Acids (FISH OIL PO) Take 2 tablets by mouth 2 (two) times daily.     Marland Kitchen omeprazole (PRILOSEC) 20 MG capsule Take 40 mg by mouth.    . Saw Palmetto, Serenoa repens, (SAW PALMETTO PO) Take 1 tablet by mouth daily.     . sertraline (ZOLOFT) 50 MG tablet Take 50 mg  by mouth daily.     Marland Kitchen VITAMIN E PO Take 1 tablet by mouth daily.      No current facility-administered medications for this visit.     REVIEW OF SYSTEMS:   [X]  denotes positive finding, [ ]  denotes negative finding Cardiac  Comments:  Chest pain or chest pressure:    Shortness of breath upon exertion:    Short of breath when lying flat:    Irregular heart rhythm:        Vascular    Pain in calf, thigh, or hip brought on by ambulation:    Pain in feet at night that wakes you up from your sleep:     Blood clot in your veins:    Leg swelling:         Pulmonary    Oxygen at home:    Productive cough:     Wheezing:         Neurologic    Sudden weakness in arms or legs:     Sudden numbness in arms or legs:     Sudden onset of difficulty speaking or slurred speech:    Temporary loss of vision in one eye:     Problems with dizziness:         Gastrointestinal    Blood in stool:      Vomited blood:         Genitourinary    Burning when urinating:     Blood in urine:        Psychiatric    Major depression:         Hematologic    Bleeding problems:    Problems with blood clotting too easily:        Skin    Rashes or ulcers:        Constitutional    Fever or chills:     PHYSICAL EXAM:   Vitals:   03/05/16 1553 03/05/16 1556  BP: (!) 144/72 (!) 143/73  Pulse: (!) 49   Resp: 16   Temp: 97 F (36.1 C)   TempSrc: Oral   SpO2: 99%   Weight: 187 lb (84.8 kg)   Height: 5\' 10"  (1.778 m)     GENERAL: The patient is a well-nourished male, in no acute distress. The vital signs are documented above. CARDIAC: There is a regular rate and rhythm.  VASCULAR: No carotid  bruits. PULMONARY: Nonlabored respirations MUSCULOSKELETAL: There are no major deformities or cyanosis. NEUROLOGIC: No focal weakness or paresthesias are detected. SKIN: There are no ulcers or rashes noted. PSYCHIATRIC: The patient has a normal affect.  STUDIES:   Ordered and reviewed his carotid  duplex which shows less than 40% bilateral internal carotid artery stenosis I used the sono site to identified the carotid bifurcation which is in the mid neck.  ASSESSMENT and PLAN   Congestive heart failure: After reviewing the patient's carotid duplex and evaluating the bifurcation myself with the SonoSite, I feel that the patient is a good candidate for device implant on the right side.  I discussed the details of the procedure with the patient.  All his questions were answered.  He will need to be off of his Eliquis   Annamarie Major, MD [facility] [phone] Pager [phone]

## 2016-03-16 NOTE — Transfer of Care (Signed)
Immediate Anesthesia Transfer of Care Note  Patient: Nathaniel Bolton  Procedure(s) Performed: Procedure(s): INSERTION OF Acquanetta Belling (Right)  Patient Location: PACU  Anesthesia Type:General  Level of Consciousness: awake, alert  and oriented  Airway & Oxygen Therapy: Patient Spontanous Breathing and Patient connected to nasal cannula oxygen  Post-op Assessment: Report given to RN, Post -op Vital signs reviewed and stable and Patient moving all extremities  Post vital signs: Reviewed and stable  Last Vitals:  Vitals:   03/16/16 0658  BP: (!) 159/67  Pulse: (!) 54  Resp: 10  Temp: 36.6 C    Last Pain:  Vitals:   03/16/16 0658  TempSrc: Oral         Complications: No apparent anesthesia complications

## 2016-03-16 NOTE — Brief Op Note (Signed)
03/16/2016  10:52 PM  PATIENT:  Nathaniel Bolton  76 y.o. male  PRE-OPERATIVE DIAGNOSIS:  Congestive heart failure I50.9  POST-OPERATIVE DIAGNOSIS:  Congestive heart failure I50.9  PROCEDURE:  Procedure(s): INSERTION OF BAROSTIM (Right)  SURGEON:  Surgeon(s) and Role:    Serafina Mitchell, MD - Primary  PHYSICIAN ASSISTANT:   ASSISTANTS: Leontine Locket   ANESTHESIA:   general  EBL:  No intake/output data recorded.  BLOOD ADMINISTERED:none  DRAINS: none   LOCAL MEDICATIONS USED:  NONE  SPECIMEN:  No Specimen  DISPOSITION OF SPECIMEN:  N/A  COUNTS:  YES  TOURNIQUET:  * No tourniquets in log *  DICTATION: .Dragon Dictation  PLAN OF CARE: Admit to inpatient   PATIENT DISPOSITION:  PACU - hemodynamically stable.   Delay start of Pharmacological VTE agent (>24hrs) due to surgical blood loss or risk of bleeding: yes

## 2016-03-16 NOTE — Interval H&P Note (Signed)
History and Physical Interval Note:  03/16/2016 9:18 AM  Nathaniel Bolton  has presented today for surgery, with the diagnosis of Congestive heart failure I50.9  The various methods of treatment have been discussed with the patient and family. After consideration of risks, benefits and other options for treatment, the patient has consented to  Procedure(s): INSERTION OF BAROSTIM (N/A) as a surgical intervention .  The patient's history has been reviewed, patient examined, no change in status, stable for surgery.  I have reviewed the patient's chart and labs.  Questions were answered to the patient's satisfaction.     Annamarie Major

## 2016-03-16 NOTE — Anesthesia Preprocedure Evaluation (Addendum)
Anesthesia Evaluation  Patient identified by MRN, date of birth, ID band Patient awake    Reviewed: Allergy & Precautions, NPO status , Patient's Chart, lab work & pertinent test results  History of Anesthesia Complications Negative for: history of anesthetic complications  Airway Mallampati: I  TM Distance: >3 FB Neck ROM: Full    Dental  (+) Edentulous Upper, Edentulous Lower, Dental Advisory Given   Pulmonary COPD, former smoker,    Pulmonary exam normal        Cardiovascular hypertension, Pt. on medications + CAD, + Past MI, + CABG and +CHF  + Cardiac Defibrillator  Rhythm:Regular Rate:Bradycardia  ICD   Neuro/Psych  Neuromuscular disease CVA (decreased vision bilaterally), Residual Symptoms    GI/Hepatic   Endo/Other  diabetes, Type 2, Insulin Dependent  Renal/GU Renal diseaseCKD stage 3 due to DM     Musculoskeletal   Abdominal Normal abdominal exam  (+)   Peds  Hematology   Anesthesia Other Findings   Reproductive/Obstetrics                            Anesthesia Physical Anesthesia Plan  ASA: III  Anesthesia Plan: General   Post-op Pain Management:    Induction: Intravenous  Airway Management Planned: Oral ETT  Additional Equipment: Arterial line  Intra-op Plan:   Post-operative Plan: Extubation in OR  Informed Consent: I have reviewed the patients History and Physical, chart, labs and discussed the procedure including the risks, benefits and alternatives for the proposed anesthesia with the patient or authorized representative who has indicated his/her understanding and acceptance.   Dental advisory given  Plan Discussed with: Anesthesiologist and CRNA  Anesthesia Plan Comments:         Anesthesia Quick Evaluation

## 2016-03-16 NOTE — Research (Signed)
Patient tolerated surgical implant of Barostim neo without event. Wife informed that patient doing well. Will follow up with patient and wife once patient fully awakens.

## 2016-03-16 NOTE — Anesthesia Procedure Notes (Signed)
Procedure Name: Intubation Date/Time: 03/16/2016 9:50 AM Performed by: Trixie Deis A Pre-anesthesia Checklist: Patient identified, Emergency Drugs available, Suction available and Patient being monitored Patient Re-evaluated:Patient Re-evaluated prior to inductionOxygen Delivery Method: Circle System Utilized Preoxygenation: Pre-oxygenation with 100% oxygen Intubation Type: IV induction Ventilation: Mask ventilation without difficulty Laryngoscope Size: Mac and 4 Grade View: Grade I Tube type: Oral Tube size: 7.5 mm Number of attempts: 1 Airway Equipment and Method: Stylet and Oral airway Placement Confirmation: ETT inserted through vocal cords under direct vision,  positive ETCO2 and breath sounds checked- equal and bilateral Secured at: 22 cm Tube secured with: Tape (left side of mouth) Dental Injury: Teeth and Oropharynx as per pre-operative assessment  Comments: Intubation performed by Jeannine Boga paramedic student under direct supervision by CRNA and MDA. Atraumatic intubation, BBB.

## 2016-03-17 LAB — BASIC METABOLIC PANEL
Anion gap: 9 (ref 5–15)
BUN: 24 mg/dL — AB (ref 6–20)
CO2: 25 mmol/L (ref 22–32)
Calcium: 8.2 mg/dL — ABNORMAL LOW (ref 8.9–10.3)
Chloride: 102 mmol/L (ref 101–111)
Creatinine, Ser: 1.41 mg/dL — ABNORMAL HIGH (ref 0.61–1.24)
GFR calc Af Amer: 55 mL/min — ABNORMAL LOW (ref >60)
GFR calc non Af Amer: 48 mL/min — ABNORMAL LOW (ref >60)
GLUCOSE: 131 mg/dL — AB (ref 65–99)
Potassium: 3.5 mmol/L (ref 3.5–5.1)
Sodium: 136 mmol/L (ref 135–145)

## 2016-03-17 LAB — CBC
HEMATOCRIT: 30.6 % — AB (ref 39.0–52.0)
Hemoglobin: 9.9 g/dL — ABNORMAL LOW (ref 13.0–17.0)
MCH: 32.5 pg (ref 26.0–34.0)
MCHC: 32.4 g/dL (ref 30.0–36.0)
MCV: 100.3 fL — AB (ref 78.0–100.0)
PLATELETS: 175 10*3/uL (ref 150–400)
RBC: 3.05 MIL/uL — ABNORMAL LOW (ref 4.22–5.81)
RDW: 14 % (ref 11.5–15.5)
WBC: 7.2 10*3/uL (ref 4.0–10.5)

## 2016-03-17 LAB — GLUCOSE, CAPILLARY: Glucose-Capillary: 107 mg/dL — ABNORMAL HIGH (ref 65–99)

## 2016-03-17 MED ORDER — OXYCODONE-ACETAMINOPHEN 5-325 MG PO TABS: 1.0000 | ORAL_TABLET | Freq: Four times a day (QID) | ORAL | 0 refills | Status: DC | PRN

## 2016-03-17 NOTE — Progress Notes (Signed)
   VASCULAR SURGERY ASSESSMENT & PLAN:  1 Day Post-Op s/p: Insertion of Barostim  Doing well.   Home today.  SUBJECTIVE: No complaints.  PHYSICAL EXAM: Vitals:   03/17/16 0100 03/17/16 0400 03/17/16 0403 03/17/16 0404  BP: (!) 106/49 (!) 118/56 (!) 118/56   Pulse: (!) 48 (!) 52 (!) 54 (!) 53  Resp: 12 10 14 15   Temp:   98 F (36.7 C)   TempSrc:   Oral   SpO2:   95%   Weight:      Height:       Right neck incision looks fine. NEURO: intact.   LABS: Lab Results  Component Value Date   WBC 7.2 03/17/2016   HGB 9.9 (L) 03/17/2016   HCT 30.6 (L) 03/17/2016   MCV 100.3 (H) 03/17/2016   PLT 175 03/17/2016   Lab Results  Component Value Date   CREATININE 1.41 (H) 03/17/2016   Lab Results  Component Value Date   INR 1.08 03/14/2016   CBG (last 3)   Recent Labs  03/16/16 1757 03/16/16 2038 03/16/16 2330  GLUCAP 185* 165* 174*    Active Problems:   CHF (congestive heart failure) ([place])    Gae Gallop Beeper: [phone] 03/17/2016

## 2016-03-17 NOTE — Discharge Summary (Signed)
Discharge Summary    Nathaniel Bolton September 16, 1940 76 y.o. male  [id number]  Admission Date: 03/16/2016  Discharge Date: 03/17/16  Physician: Serafina Mitchell, MD  Admission Diagnosis: CHF (congestive heart failure) ([place]) [I50.9]   HPI:   This is a 76 y.o. male  who is referred for carotid sinus stimulator.  He has a history of congestive heart failure with an ejection fraction of 27%.  He has remote history of coronary artery disease.  He has AICD implant secondary to ventricular tachycardia.  He has a history of paroxysmal A. fib and is on Eliquis.  He takes a statin for hypercholesterolemia.  He is medical management hypertension.  Hospital Course:  The patient was admitted to the hospital and taken to the operating room on 03/16/2016 and underwent: Insertion of barostim.    The pt tolerated the procedure well and was transported to the PACU in good condition.   By POD 1, he is doing well and discharged home.    The remainder of the hospital course consisted of increasing mobilization and increasing intake of solids without difficulty.  CBC    Component Value Date/Time   WBC 7.2 03/17/2016 0220   RBC 3.05 (L) 03/17/2016 0220   HGB 9.9 (L) 03/17/2016 0220   HCT 30.6 (L) 03/17/2016 0220   PLT 175 03/17/2016 0220   MCV 100.3 (H) 03/17/2016 0220   MCH 32.5 03/17/2016 0220   MCHC 32.4 03/17/2016 0220   RDW 14.0 03/17/2016 0220    BMET    Component Value Date/Time   NA 136 03/17/2016 0220   NA 144 01/03/2016 0852   K 3.5 03/17/2016 0220   CL 102 03/17/2016 0220   CO2 25 03/17/2016 0220   GLUCOSE 131 (H) 03/17/2016 0220   BUN 24 (H) 03/17/2016 0220   BUN 23 01/03/2016 0852   CREATININE 1.41 (H) 03/17/2016 0220   CREATININE 1.28 (H) 09/15/2015 1251   CALCIUM 8.2 (L) 03/17/2016 0220   GFRNONAA 48 (L) 03/17/2016 0220   GFRAA 55 (L) 03/17/2016 0220      Discharge Instructions    CAROTID Sugery: Call MD for difficulty swallowing or speaking; weakness in arms or  legs that is a new symtom; severe headache.  If you have increased swelling in the neck and/or  are having difficulty breathing, CALL 911    Complete by:  As directed    Call MD for:  redness, tenderness, or signs of infection (pain, swelling, bleeding, redness, odor or green/yellow discharge around incision site)    Complete by:  As directed    Call MD for:  severe or increased pain, loss or decreased feeling  in affected limb(s)    Complete by:  As directed    Call MD for:  temperature >100.5    Complete by:  As directed    Discharge wound care:    Complete by:  As directed    Shower daily with soap and water starting 03/18/16   Driving Restrictions    Complete by:  As directed    No driving for 2 weeks   Lifting restrictions    Complete by:  As directed    No lifting for 2 weeks   Resume previous diet    Complete by:  As directed       Discharge Diagnosis:  CHF (congestive heart failure) ([place]) [I50.9]  Secondary Diagnosis: Patient Active Problem List   Diagnosis Date Noted  . CHF (congestive heart failure) ([place]) 03/16/2016  . Medication management  12/22/2015  . Mixed hyperlipidemia 10/28/2015  . Paroxysmal atrial fibrillation ([place]) 10/28/2015  . Type 2 diabetes mellitus with circulatory disorder, without long-term current use of insulin ([place]) 10/28/2015  . Diabetic polyneuropathy associated with type 2 diabetes mellitus ([place]) 08/12/2015  . Skin ulcer of toe of left foot, limited to breakdown of skin ([place]) 08/12/2015  . CAD in native artery 07/15/2015  . Cardiac defibrillator in situ 07/15/2015  . CKD (chronic kidney disease) 07/15/2015  . Essential hypertension 07/15/2015  . Hyperlipidemia 07/15/2015  . S/P CABG (coronary artery bypass graft) 07/15/2015  . Syncope 07/15/2015  . Ventricular tachycardia (paroxysmal) ([place]) 07/15/2015  . Atrial fibrillation ([place]) 04/16/2015  . Ventricular tachycardia ([place]) 09/07/2014  . CVA (cerebral infarction) 03/29/2011  . Ischemic  cardiomyopathy 02/08/2011  . URI (upper respiratory infection) 11/27/2010  . Chronic systolic heart failure ([place]) 11/06/2010  . Automatic implantable cardioverter-defibrillator in situ 10/23/2010  . CAD (coronary artery disease)   . Hypertension   . Diabetes mellitus ([place])   . Myocardial infarction, old    Past Medical History:  Diagnosis Date  . CAD (coronary artery disease)   . CKD stage 3 due to type 2 diabetes mellitus ([place])   . Diabetes mellitus    Type 2  . Erectile dysfunction   . Gout   . Hyperlipidemia   . Hypertension   . ICD (implantable cardiac defibrillator) in place   . Myocardial infarction, old    x2 in [place]. S/P CABG 2003, Dr Roxy Manns  . Obstructive lung disease (HCC)    Moderate per prior PFT's  . Paroxysmal atrial fibrillation ([place]) 3/17   discovered on event monitor  . Stroke [facility])    2013  . Systolic CHF, acute on chronic (HCC)    EF is 27% per myoview and echo October 2012  . Ventricular tachycardia ([place]) 08/2014   175 bpm     Allergies as of 03/17/2016      Reactions   Adhesive [tape] Itching, Rash   Testosterone patch adhesive      Medication List    TAKE these medications   allopurinol 100 MG tablet Commonly known as:  ZYLOPRIM Take 100 mg by mouth daily.   amiodarone 200 MG tablet Commonly known as:  PACERONE Take 1 tablet (200 mg total) by mouth daily.   apixaban 5 MG Tabs tablet Commonly known as:  ELIQUIS Take 1 tablet (5 mg total) by mouth 2 (two) times daily.   atorvastatin 40 MG tablet Commonly known as:  LIPITOR Take 40 mg by mouth daily at 6 PM.   carvedilol 6.25 MG tablet Commonly known as:  COREG Take 1 tablet by mouth 2 (two) times daily with a meal.   FISH OIL PO Take 2 tablets by mouth 2 (two) times daily.   furosemide 40 MG tablet Commonly known as:  LASIX Take 1 tablet (40 mg total) by mouth 2 (two) times daily.   glipiZIDE 5 MG tablet Commonly known as:  GLUCOTROL Take 2.5 mg by mouth daily before  breakfast.   insulin aspart protamine- aspart (70-30) 100 UNIT/ML injection Commonly known as:  NOVOLOG MIX 70/30 Inject 5-10 Units into the skin 3 (three) times daily as needed (IF blood sugar is <150).   losartan 50 MG tablet Commonly known as:  COZAAR Take 1 tablet (50 mg total) by mouth daily.   NITROSTAT 0.4 MG SL tablet Generic drug:  nitroGLYCERIN Place 0.4 mg under the tongue every 5 (five) minutes as needed for  chest pain (MAX 3 TABLETS).   NON FORMULARY Needles, syringes with needles, glucose reagent test strips   omeprazole 40 MG capsule Commonly known as:  PRILOSEC Take 40 mg by mouth daily.   oxyCODONE-acetaminophen 5-325 MG tablet Commonly known as:  PERCOCET/ROXICET Take 1 tablet by mouth every 6 (six) hours as needed for moderate pain.   SAW PALMETTO PO Take 1 tablet by mouth daily.   sertraline 50 MG tablet Commonly known as:  ZOLOFT Take 50 mg by mouth daily.   vitamin C 1000 MG tablet Take 1,000 mg by mouth daily.   Vitamin D3 2000 units Tabs Take 1 tablet by mouth 2 (two) times daily.   vitamin E 400 UNIT capsule Take 1 capsule by mouth daily.       Prescriptions given: Roxicet #8 No Refill  Instructions: 1.  No driving for 2 weeks and while taking pain medication 2.  No heavy lifting x 2 weeks 3.  Shower daily starting 03/18/16  Disposition: home  Patient's condition: is Good  Follow up: 1. Dr. Trula Slade in 2 weeks   Leontine Locket, PA-C [facility] [phone] 03/17/2016  9:23 AM

## 2016-03-17 NOTE — Progress Notes (Signed)
Discharge Note. Discharge instructions reviewed with patient and wife at the bedside. Reviewed all medications, follow up appointments, and care of the surgical site. Also reviewed stroke handouts that were given. Patient verbalized understanding. Patient and wife asked appropriate questions and is ready for discharge. Patient taken in wheelchair down to wife's car and helped into vehicle. Patient thanked staff for caring for him.

## 2016-03-18 NOTE — Op Note (Signed)
    Patient name: MAEL DELAP MRN: [id number] DOB: [birth year] Sex: male  03/16/2016 Pre-operative Diagnosis: CHF Post-operative diagnosis:  Same Surgeon:  Annamarie Major Assistants:  S. Rhyne Procedure:   Insertion of Barostim Neo device on the right side Anesthesia:  Gen Blood Loss:  See anesthesia record Specimens:  none  Findings:  Normal anatomy  Indications:  The patient has been evaluated for Barostim Neo insertion.  He was randomized to device insertion and is ghere for surgery  Procedure:  The patient was identified in the holding area and taken to [place] 10  The patient was then placed supine on the table.  General anesthesia was administered.  The patient was prepped and draped in the usual sterile fashion.  A time out was called and antibiotics were administered.  Ultrasound was used to identify the right carotid bifurcation.  This was marked.  A transverse incision was made at this level for distance of 4 cm.  Cautery was used to divide the subcutaneous tissue down to the platysma muscle which was divided with cautery.  I dissected along the medial border of the sternocleidomastoid and reflected the muscle laterally.  Using sharp dissection, the common carotid artery as well as the bifurcation was exposed.  Minimal adventitial dissection was performed.  Next, the electrode and device were connected and the electrode was placed in position A on the carotid sinus.  The appropriate testing was performed and this was found to be an appropriate site.  I initially placed sutures at 7:00 and 5:00 position.  The device was retested and found to be in a good spot.   The clip attachment was then cut.  The remaining sutures were placed at 11, 9,7,5,3, and 1:00.  Next a transverse incision was created 1 fingerbreadth below the clavicle in the clavipectoral groove.  Cautery was used to divide subcutaneous tissue down to the pectoral fascia.  A pocket was then created for the generator.   Once this was fully developed, I created a tunnel between the 2 incisions and then brought the device through the tunnel.  The strain relief loop was then attached to the common carotid artery with 2 6-0 proline sutures.  The generator pocket was irrigated with antibiotic solution as was the carotid incision.  It was connected to the electrode.  2 2-0 Ethibond sutures were used to anchor the generator.  The cord was then placed to the medial side of the generator which was tucked into its pocket.  I ensure that hemostasis was appropriate.  Once this was confirmed, the subcutaneous tissue was reapproximated with 3-0 Vicryl and the skin was closed with 4-0 Vicryl for both incisions.  There were no immediate complications    Disposition:  To PACU stable   V. Annamarie Major, M.D. [facility] [place] Office: [phone] Pager:  [phone]

## 2016-03-19 LAB — GLUCOSE, CAPILLARY: Glucose-Capillary: 161 mg/dL — ABNORMAL HIGH (ref 65–99)

## 2016-03-26 DIAGNOSIS — Z006 Encounter for examination for normal comparison and control in clinical research program: Secondary | ICD-10-CM

## 2016-03-26 NOTE — Progress Notes (Signed)
Patient present with his wife for Week 2 BeAT-HF study visit. He states "I feel really good. I had expected soreness from surgery but that is resolved. I am urinating better than before surgery and have noticed my urine is clearer." Medications reviewed with patient and his wife as she fills his pill box. Both deny any changes in heart failure medications. States "my breathing has been good, no shortness of breath since the few days before implant." Praised for good compliance with medications and low sodium diet. Barostim incision at right neck and right chest healing without problems. Patient denied redness, fevers or drainage from incisions. Verbalizes that he will call or have his wife call with any complications, questions, or concerns. He has an appointment to see Dr. Trula Slade Monday 3-26 for follow up. Device amplitude turned to 1.8 from 1 by Renita Papa, [employer] representative. Patient ambulated with writer without problem. Vital signs remain stable. Will return in 2 weeks for Visit Week 4. Very pleasant.

## 2016-03-27 NOTE — Anesthesia Postprocedure Evaluation (Addendum)
Anesthesia Post Note  Patient: CHRISTOFFER CURRIER  Procedure(s) Performed: Procedure(s) (LRB): INSERTION OF BAROSTIM (Right)  Patient location during evaluation: PACU Anesthesia Type: General Level of consciousness: awake and alert Pain management: pain level controlled Vital Signs Assessment: post-procedure vital signs reviewed and stable Respiratory status: spontaneous breathing, nonlabored ventilation, respiratory function stable and patient connected to nasal cannula oxygen Cardiovascular status: blood pressure returned to baseline and stable Postop Assessment: no signs of nausea or vomiting Anesthetic complications: no       Last Vitals:  Vitals:   03/17/16 0404 03/17/16 0720  BP:  132/62  Pulse: (!) 53 (!) 54  Resp: 15 14  Temp:  36.6 C    Last Pain:  Vitals:   03/17/16 0720  TempSrc: Oral  PainSc:                  Jessicaann Overbaugh

## 2016-04-02 ENCOUNTER — Encounter: Payer: Self-pay | Admitting: Surgery

## 2016-04-02 ENCOUNTER — Ambulatory Visit (INDEPENDENT_AMBULATORY_CARE_PROVIDER_SITE_OTHER): Payer: Self-pay | Admitting: Surgery

## 2016-04-02 VITALS — BP 136/68 | HR 55 | Temp 97.8°F | Resp 18 | Ht 70.0 in | Wt 176.0 lb

## 2016-04-02 DIAGNOSIS — I5022 Chronic systolic (congestive) heart failure: Secondary | ICD-10-CM

## 2016-04-02 NOTE — Progress Notes (Signed)
Patient name: Nathaniel Bolton MRN: [id number] DOB: 06-23-1940 Sex: male  REASON FOR VISIT:     Postop  HISTORY OF PRESENT ILLNESS:   Nathaniel Bolton is a 76 y.o. male returns today for his first postoperative visit.  On 03/17/2016, he underwent insertion of a Barostim Neo device on the right side.  His postoperative course was uncomplicated and he was discharged home the following day.  He states that he has had some stiffness in his neck but that this is getting better.  He reports that he now has the energy that he did when he was a teenager.  He has noticed a dramatic improvement.  He also states that anecdotally his  CURRENT MEDICATIONS:    Current Outpatient Prescriptions  Medication Sig Dispense Refill  . allopurinol (ZYLOPRIM) 100 MG tablet Take 100 mg by mouth daily.     Marland Kitchen amiodarone (PACERONE) 200 MG tablet Take 1 tablet (200 mg total) by mouth daily. 90 tablet 3  . apixaban (ELIQUIS) 5 MG TABS tablet Take 1 tablet (5 mg total) by mouth 2 (two) times daily. 60 tablet 0  . Ascorbic Acid (VITAMIN C) 1000 MG tablet Take 1,000 mg by mouth daily.    Marland Kitchen atorvastatin (LIPITOR) 40 MG tablet Take 40 mg by mouth daily at 6 PM.     . carvedilol (COREG) 6.25 MG tablet Take 1 tablet by mouth 2 (two) times daily with a meal.     . Cholecalciferol (VITAMIN D3) 2000 units TABS Take 1 tablet by mouth 2 (two) times daily.    . furosemide (LASIX) 40 MG tablet Take 1 tablet (40 mg total) by mouth 2 (two) times daily. 180 tablet 3  . glipiZIDE (GLUCOTROL) 5 MG tablet Take 2.5 mg by mouth daily before breakfast.     . insulin aspart protamine- aspart (NOVOLOG MIX 70/30) (70-30) 100 UNIT/ML injection Inject 5-10 Units into the skin 3 (three) times daily as needed (IF blood sugar is <150).     Marland Kitchen losartan (COZAAR) 50 MG tablet Take 1 tablet (50 mg total) by mouth daily. 30 tablet 6  . NITROSTAT 0.4 MG SL tablet Place 0.4 mg under the tongue every 5 (five) minutes as  needed for chest pain (MAX 3 TABLETS).     . NON FORMULARY Needles, syringes with needles, glucose reagent test strips    . Omega-3 Fatty Acids (FISH OIL PO) Take 2 tablets by mouth 2 (two) times daily.     Marland Kitchen omeprazole (PRILOSEC) 40 MG capsule Take 40 mg by mouth daily.     Marland Kitchen oxyCODONE-acetaminophen (PERCOCET/ROXICET) 5-325 MG tablet Take 1 tablet by mouth every 6 (six) hours as needed for moderate pain. 8 tablet 0  . Saw Palmetto, Serenoa repens, (SAW PALMETTO PO) Take 1 tablet by mouth daily.     . sertraline (ZOLOFT) 50 MG tablet Take 50 mg by mouth daily.     . vitamin E 400 UNIT capsule Take 1 capsule by mouth daily.      No current facility-administered medications for this visit.     REVIEW OF SYSTEMS:   [X]  denotes positive finding, [ ]  denotes negative finding Cardiac  Comments:  Chest pain or chest pressure: Blood sugars have been better.     Shortness of breath upon exertion:    Short of breath when lying flat:    Irregular heart rhythm:    Constitutional    Fever or chills:      PHYSICAL EXAM:   Vitals:  04/02/16 1245  BP: 136/68  Pulse: (!) 55  Resp: 18  Temp: 97.8 F (36.6 C)  TempSrc: Oral  SpO2: 99%  Weight: 176 lb (79.8 kg)  Height: 5\' 10"  (1.778 m)    GENERAL: The patient is a well-nourished male, in no acute distress. The vital signs are documented above. CARDIOVASCULAR: There is a regular rate and rhythm. PULMONARY: Non-labored respirations His incisions in the right neck and infraclavicular region are clean dry and intact.  There is noted erythema.  There has been no drainage.  STUDIES:   None   MEDICAL ISSUES:   Successful implant of Barostim Neo device.  The patient will get routine follow-up through the study protocol.  From my perspective he is doing very well at this time.  Annamarie Major, MD [facility] [phone] Pager [phone]

## 2016-04-03 ENCOUNTER — Ambulatory Visit (INDEPENDENT_AMBULATORY_CARE_PROVIDER_SITE_OTHER): Payer: Medicare HMO

## 2016-04-03 ENCOUNTER — Ambulatory Visit (INDEPENDENT_AMBULATORY_CARE_PROVIDER_SITE_OTHER): Payer: Medicare HMO | Admitting: *Deleted

## 2016-04-03 DIAGNOSIS — Z95810 Presence of automatic (implantable) cardiac defibrillator: Secondary | ICD-10-CM | POA: Diagnosis not present

## 2016-04-03 DIAGNOSIS — I5022 Chronic systolic (congestive) heart failure: Secondary | ICD-10-CM | POA: Diagnosis not present

## 2016-04-03 NOTE — Progress Notes (Signed)
Remote ICD transmission.   

## 2016-04-03 NOTE — Progress Notes (Signed)
EPIC Encounter for ICM Monitoring  Patient Name: Nathaniel Bolton is a 76 y.o. male Date: 04/03/2016 Primary Care Physican: Laverna Peace, NP Primary [place] Electrophysiologist: Allred Nephrologist: [facility]      Spoke with wife.  Heart Failure questions reviewed, pt asymptomatic.   Thoracic impedance normal since 03/26/2016.  Prescribed and confirmed dosage: Furosemide 40 mg twice daily  Labs: 03/17/2016 Creatinine 1.41, BUN 24, Potassium 3.5, Sodium 136, EGFR 48-55 03/14/2016 Creatinine 1.54, BUN 35, Potassium 4.2, Sodium 140, EGFR 43-50  01/03/2016 Creatinine 1.52, BUN 23, Potassium 4.5, Sodium 144, EGFR 44-51 12/19/2015 Creatinine 1.36, BUN 24, Potassium 4.7, Sodium 138, EGFR 51-59  09/15/2015 Creatinine 1.28, BUN 24, Potassium 4.2, Sodium 139  03/02/2015 Creatinine 1.58, BUN 34, Potassium 4.5, Sodium 135   Recommendations: No changes. Reminded to limit dietary salt intake to 2000 mg/day and fluid intake to < 2 liters/day. Encouraged to call for fluid symptoms.  Follow-up plan: ICM clinic phone appointment on 05/25/2016.  Defib office check with Dr Rayann Heman 04/30/2016 and office visit with Dr Martinique 05/28/2016  Copy of ICM check sent to primary cardiologist and device physician.   3 month ICM trend: 04/03/2016   1 Year ICM trend:      [name], RN 04/03/2016 10:19 AM

## 2016-04-04 LAB — CUP PACEART REMOTE DEVICE CHECK
Brady Statistic RV Percent Paced: 0.6 %
Date Time Interrogation Session: 20180327062608
HighPow Impedance: 39 Ohm
HighPow Impedance: 48 Ohm
Implantable Lead Implant Date: 20091217
Lead Channel Sensing Intrinsic Amplitude: 6.75 mV
Lead Channel Setting Pacing Amplitude: 2.5 V
Lead Channel Setting Sensing Sensitivity: 0.3 mV
MDC IDC LEAD LOCATION: 753860
MDC IDC MSMT BATTERY VOLTAGE: 2.62 V
MDC IDC MSMT LEADCHNL RV IMPEDANCE VALUE: 342 Ohm
MDC IDC MSMT LEADCHNL RV PACING THRESHOLD AMPLITUDE: 1.125 V
MDC IDC MSMT LEADCHNL RV PACING THRESHOLD PULSEWIDTH: 0.4 ms
MDC IDC MSMT LEADCHNL RV SENSING INTR AMPL: 6.75 mV
MDC IDC PG IMPLANT DT: 20091217
MDC IDC SET LEADCHNL RV PACING PULSEWIDTH: 0.4 ms

## 2016-04-05 DIAGNOSIS — S82831A Other fracture of upper and lower end of right fibula, initial encounter for closed fracture: Secondary | ICD-10-CM | POA: Diagnosis not present

## 2016-04-05 DIAGNOSIS — M25571 Pain in right ankle and joints of right foot: Secondary | ICD-10-CM | POA: Diagnosis not present

## 2016-04-06 ENCOUNTER — Encounter: Payer: Self-pay | Admitting: Cardiology

## 2016-04-12 DIAGNOSIS — Z006 Encounter for examination for normal comparison and control in clinical research program: Secondary | ICD-10-CM

## 2016-04-12 NOTE — Progress Notes (Signed)
Pt present today for Beat HF Research appointment week 4.  Pt continues to do well, endurance increasing.  Both right neck and right chest incisions healed.  No signs/symptoms of ACS.  Renita Papa [facility] present for device programming.  Barostim amplitude adjusted from 1.8 to 2.6.  Pt did not experience transient electrical stimulation of non-vascular tissue.  Ambulated in hallway with Study Coordinator without event.  Will return in 2 weeks.

## 2016-04-18 DIAGNOSIS — Z6825 Body mass index (BMI) 25.0-25.9, adult: Secondary | ICD-10-CM | POA: Diagnosis not present

## 2016-04-18 DIAGNOSIS — J209 Acute bronchitis, unspecified: Secondary | ICD-10-CM | POA: Diagnosis not present

## 2016-04-20 DIAGNOSIS — Z006 Encounter for examination for normal comparison and control in clinical research program: Secondary | ICD-10-CM

## 2016-04-20 NOTE — Progress Notes (Signed)
Patient's wife called and stated [facility] this [facility] and blood clot was noted in left ventricle. She states they gave her a disk of the echo, which she dropped off at Dr. Lynford Humphrey office, and assured her he was on "blood thinner." Patient feels well, other than bronchitis for which he has seen his PCP. She states they will seek medical attention if any symptoms arise.   Spoke to [name] RN, Dr. Doug Sou nurse and she states [name] did bring disk by and it is on Dr. Doug Sou desk to view Monday morning.

## 2016-04-23 ENCOUNTER — Telehealth: Payer: Self-pay | Admitting: Cardiology

## 2016-04-23 DIAGNOSIS — E663 Overweight: Secondary | ICD-10-CM | POA: Diagnosis not present

## 2016-04-23 DIAGNOSIS — J208 Acute bronchitis due to other specified organisms: Secondary | ICD-10-CM | POA: Diagnosis not present

## 2016-04-23 DIAGNOSIS — Z006 Encounter for examination for normal comparison and control in clinical research program: Secondary | ICD-10-CM

## 2016-04-23 DIAGNOSIS — Z6826 Body mass index (BMI) 26.0-26.9, adult: Secondary | ICD-10-CM | POA: Diagnosis not present

## 2016-04-23 NOTE — Telephone Encounter (Signed)
She said she left a CD for Dr Martinique to review it. She wants to know if he have reviewed it and if so what did he decide?

## 2016-04-23 NOTE — Progress Notes (Deleted)
Pt present today for Beat HF Research appointment week 4.  Pt continues to do well other than a nonproductive cough diagnosed by PCP and treated with prednisone and cough syrup.  Pt continues to await phone call from Dr. Martinique regarding the "blood clot" noted on echo by the [facility] cardiologist.  Pt asymptomatic.  Both right neck and right chest incisions healed.  No signs/symptoms of ACS.  Renita Papa [facility] present for device programming.  Barostim amplitude adjusted from 2.6 to 3.4.  Pt did not experience transient electrical stimulation of non-vascular tissue.  Ambulated in hallway with Study Coordinator without event.  Will return in 2 weeks.

## 2016-04-24 NOTE — Telephone Encounter (Signed)
Spoke to patient's wife 04/23/16.Dr.Jordan reviewed echo she brought from [place].He advised echo revealed small clot.He advised he is on appropriate treatment.Advised to continue Eliquis.

## 2016-04-30 ENCOUNTER — Ambulatory Visit (INDEPENDENT_AMBULATORY_CARE_PROVIDER_SITE_OTHER): Payer: Medicare HMO | Admitting: Internal Medicine

## 2016-04-30 ENCOUNTER — Other Ambulatory Visit: Payer: Self-pay

## 2016-04-30 VITALS — BP 130/56 | HR 53 | Ht 70.0 in | Wt 180.8 lb

## 2016-04-30 DIAGNOSIS — I472 Ventricular tachycardia, unspecified: Secondary | ICD-10-CM

## 2016-04-30 DIAGNOSIS — Z95810 Presence of automatic (implantable) cardiac defibrillator: Secondary | ICD-10-CM | POA: Diagnosis not present

## 2016-04-30 DIAGNOSIS — I480 Paroxysmal atrial fibrillation: Secondary | ICD-10-CM | POA: Diagnosis not present

## 2016-04-30 DIAGNOSIS — I5022 Chronic systolic (congestive) heart failure: Secondary | ICD-10-CM | POA: Diagnosis not present

## 2016-04-30 NOTE — Progress Notes (Signed)
Electrophysiology Office Note   Date:  04/30/2016   ID:  Nathaniel Bolton, DOB 1940/05/16, MRN [id number]  PCP:  Laverna Peace, NP  Cardiologist:  Dr Martinique Primary Electrophysiologist: Thompson Grayer, MD    Chief Complaint  Patient presents with  . Atrial Fibrillation     History of Present Illness: Nathaniel Bolton is a 76 y.o. male who presents today for electrophysiology evaluation.   He is doing very well since recent barostim device implant. Energy has significantly improved.  Today, he denies symptoms of palpitations, claudication, dizziness, presyncope, syncope, bleeding, or neurologic sequela. The patient is tolerating medications without difficulties and is otherwise without complaint today.    Past Medical History:  Diagnosis Date  . CAD (coronary artery disease)   . CKD stage 3 due to type 2 diabetes mellitus ([place])   . Diabetes mellitus    Type 2  . Erectile dysfunction   . Gout   . Hyperlipidemia   . Hypertension   . ICD (implantable cardiac defibrillator) in place   . Myocardial infarction, old    x2 in [place]. S/P CABG 2003, Dr Roxy Manns  . Obstructive lung disease (HCC)    Moderate per prior PFT's  . Paroxysmal atrial fibrillation ([place]) 3/17   discovered on event monitor  . Stroke [facility])    2013  . Systolic CHF, acute on chronic (HCC)    EF is 27% per myoview and echo October 2012  . Ventricular tachycardia ([place]) 08/2014   175 bpm   Past Surgical History:  Procedure Laterality Date  . BYPASS GRAFT     x5  . CARDIAC DEFIBRILLATOR PLACEMENT  2008   Medronic by Dr Elonda Husky at [facility]  . cataract surgery    . CHOLECYSTECTOMY    . COLONOSCOPY    . KNEE ARTHROSCOPY     right  . UMBILICAL HERNIA REPAIR       Current Outpatient Prescriptions  Medication Sig Dispense Refill  . allopurinol (ZYLOPRIM) 100 MG tablet Take 100 mg by mouth daily.     Marland Kitchen amiodarone (PACERONE) 200 MG tablet Take 1 tablet (200 mg total) by mouth daily. 90 tablet 3  .  apixaban (ELIQUIS) 5 MG TABS tablet Take 1 tablet (5 mg total) by mouth 2 (two) times daily. 60 tablet 0  . Ascorbic Acid (VITAMIN C) 1000 MG tablet Take 1,000 mg by mouth daily.    Marland Kitchen atorvastatin (LIPITOR) 40 MG tablet Take 40 mg by mouth daily at 6 PM.     . carvedilol (COREG) 6.25 MG tablet Take 1 tablet by mouth 2 (two) times daily with a meal.     . Cholecalciferol (VITAMIN D3) 2000 units TABS Take 1 tablet by mouth 2 (two) times daily.    . furosemide (LASIX) 40 MG tablet Take 1 tablet (40 mg total) by mouth 2 (two) times daily. 180 tablet 3  . glipiZIDE (GLUCOTROL) 5 MG tablet Take 2.5 mg by mouth daily before breakfast.     . insulin aspart protamine- aspart (NOVOLOG MIX 70/30) (70-30) 100 UNIT/ML injection Inject 5-10 Units into the skin 3 (three) times daily as needed (IF blood sugar is <150).     Marland Kitchen losartan (COZAAR) 50 MG tablet Take 1 tablet (50 mg total) by mouth daily. 30 tablet 6  . NITROSTAT 0.4 MG SL tablet Place 0.4 mg under the tongue every 5 (five) minutes as needed for chest pain (MAX 3 TABLETS).     . NON FORMULARY Needles,  syringes with needles, glucose reagent test strips    . Omega-3 Fatty Acids (FISH OIL PO) Take 2 tablets by mouth 2 (two) times daily.     Marland Kitchen omeprazole (PRILOSEC) 40 MG capsule Take 40 mg by mouth daily.     . Saw Palmetto, Serenoa repens, (SAW PALMETTO PO) Take 1 tablet by mouth daily.     . sertraline (ZOLOFT) 50 MG tablet Take 50 mg by mouth daily.     . vitamin E 400 UNIT capsule Take 1 capsule by mouth daily.      No current facility-administered medications for this visit.     Allergies:   Adhesive [tape]   Social History:  The patient  reports that he quit smoking about 27 years ago. He has never used smokeless tobacco. He reports that he does not drink alcohol or use drugs.   Family History:  The patient's family history includes Heart attack in his brother and father; Heart disease in his sister; Heart failure in his mother.    ROS:   Please see the history of present illness.   All other systems are reviewed and negative.    PHYSICAL EXAM: VS:  BP (!) 130/56   Pulse (!) 53   Ht 5\' 10"  (1.778 m)   Wt 180 lb 12.8 oz (82 kg)   SpO2 95%   BMI 25.94 kg/m  , BMI Body mass index is 25.94 kg/m. GEN: Well nourished, well developed, in no acute distress  HEENT: normal  Neck: no JVD, carotid bruits, or masses Cardiac: bradycardic regular rhythm,  no murmurs, rubs, or gallops,no edema  Respiratory:  clear to auscultation bilaterally, normal work of breathing GI: soft, nontender, nondistended, + BS MS: no deformity or atrophy  Skin: warm and dry, device pocket is well healed,  barostim device pocket is well healed.  There was a very small retained suture along the lateral border which I removed uneventfully today. Neuro:  Strength and sensation are intact Psych: euthymic mood, full affect  EKG:  EKG is ordered today. The ekg ordered today shows sinus bradycardia 48 bpm, IVCD (QRS 100 msec), artifact from barostim  Device interrogation is personally reviewed today in detail.  See PaceArt for details.   Recent Labs: 09/15/2015: Brain Natriuretic Peptide 98.3 03/14/2016: ALT 20 03/17/2016: BUN 24; Creatinine, Ser 1.41; Hemoglobin 9.9; Platelets 175; Potassium 3.5; Sodium 136    Lipid Panel  No results found for: CHOL, TRIG, HDL, CHOLHDL, VLDL, LDLCALC, LDLDIRECT   Wt Readings from Last 3 Encounters:  04/30/16 180 lb 12.8 oz (82 kg)  04/23/16 175 lb (79.4 kg)  04/02/16 176 lb (79.8 kg)      ASSESSMENT AND PLAN:  1.  Ischemic CM/ chronic systolic dysfunction EF 78% by echo 2/18 Normal ICD function See Pace Art report No changes today 2 gram sodium diet advised He is approaching ERI. Risks, benefits, and alternatives to ICD pulse generator replacement were discussed in detail today.  The patient understands that risks include but are not limited to bleeding, infection, pneumothorax, perforation, tamponade, vascular  damage, renal failure, MI, stroke, death, inappropriate shocks, damage to his existing leads, and lead dislodgement and wishes to proceed.  We will therefore schedule the procedure once he reaches ERI.  Would hold eliquis for 2 days prior to the procedure.  He has chronic bradycardia, though histograms reveal V rates are mostly 50s.  As he is asymptomatic, we discussed likely not adding an atrial lead at time of gen change.  2.  CRI Stable No change required today  3. HTN Stable No change required today  4.  VT No recent episodes He is clear that he would lijke to have his ICD generator replaced once ERI.  5. Afib Asymptomatic  Well controlled On eliquis   Follow-up: carelink, follow-up with Dr Martinique as scheduled I will see at generator replacement once ERI  Signed, Thompson Grayer, MD    [place] [address] [place] [address] [phone] (office) [phone] (fax)

## 2016-04-30 NOTE — Patient Instructions (Signed)
Medication Instructions:  Your physician recommends that you continue on your current medications as directed. Please refer to the Current Medication list given to you today.   Labwork: None ordered   Testing/Procedures: None ordered   Follow-Up: Remote monitoring is used to monitor your  ICD from home. This monitoring reduces the number of office visits required to check your device to one time per year. It allows [place] to keep an eye on the functioning of your device to ensure it is working properly. You are scheduled for a device check from home on 07/30/16. You may send your transmission at any time that day. If you have a wireless device, the transmission will be sent automatically. After your physician reviews your transmission, you will receive a postcard with your next transmission date.  Your physician wants you to follow-up in: 12 months with Dr Vallery Ridge will receive a reminder letter in the mail two months in advance. If you don't receive a letter, please call our office to schedule the follow-up appointment.     Any Other Special Instructions Will Be Listed Below (If Applicable).     If you need a refill on your cardiac medications before your next appointment, please call your pharmacy.

## 2016-05-07 ENCOUNTER — Telehealth: Payer: Self-pay

## 2016-05-07 DIAGNOSIS — Z1389 Encounter for screening for other disorder: Secondary | ICD-10-CM | POA: Diagnosis not present

## 2016-05-07 DIAGNOSIS — Z6826 Body mass index (BMI) 26.0-26.9, adult: Secondary | ICD-10-CM | POA: Diagnosis not present

## 2016-05-07 DIAGNOSIS — J208 Acute bronchitis due to other specified organisms: Secondary | ICD-10-CM | POA: Diagnosis not present

## 2016-05-07 DIAGNOSIS — E663 Overweight: Secondary | ICD-10-CM | POA: Diagnosis not present

## 2016-05-07 NOTE — Telephone Encounter (Signed)
FAXED NOTES TO [facility] 

## 2016-05-09 ENCOUNTER — Telehealth: Payer: Self-pay | Admitting: Cardiology

## 2016-05-09 NOTE — Telephone Encounter (Signed)
Received records from [facility] for appointment on 05/28/16 with Dr Martinique.  Records put with Dr Doug Sou schedule for 05/28/16. lp

## 2016-05-14 DIAGNOSIS — Z006 Encounter for examination for normal comparison and control in clinical research program: Secondary | ICD-10-CM

## 2016-05-15 NOTE — Progress Notes (Addendum)
Subject in for BeAT-HF Study visit 2 months. Verbalizes excellent medication and diet compliance. Denies any signs or symptoms of heart failure exacerbation since last research study visit. Weight stable. Right neck and right chest Barostim neo and lead implant sites healed well. Verbalized endurance is steadily increasing, walking greater distance without shortness of breath. No further symptoms of recent illness as he completed mediations prescribed by PCP for viral pneumonia (04-18-16) and acute bronchitis (04-27-16); received notes from PCP. Will contact study coordinator with any study related questions. Will follow up with research in 4 weeks.

## 2016-05-18 ENCOUNTER — Other Ambulatory Visit: Payer: Self-pay | Admitting: *Deleted

## 2016-05-18 MED ORDER — LOSARTAN POTASSIUM 50 MG PO TABS: 50.0000 mg | ORAL_TABLET | Freq: Every day | ORAL | 1 refills | Status: DC

## 2016-05-21 DIAGNOSIS — E782 Mixed hyperlipidemia: Secondary | ICD-10-CM | POA: Diagnosis not present

## 2016-05-21 DIAGNOSIS — E1140 Type 2 diabetes mellitus with diabetic neuropathy, unspecified: Secondary | ICD-10-CM | POA: Diagnosis not present

## 2016-05-21 DIAGNOSIS — E1169 Type 2 diabetes mellitus with other specified complication: Secondary | ICD-10-CM | POA: Diagnosis not present

## 2016-05-21 DIAGNOSIS — I509 Heart failure, unspecified: Secondary | ICD-10-CM | POA: Diagnosis not present

## 2016-05-21 DIAGNOSIS — I10 Essential (primary) hypertension: Secondary | ICD-10-CM | POA: Diagnosis not present

## 2016-05-21 DIAGNOSIS — N189 Chronic kidney disease, unspecified: Secondary | ICD-10-CM | POA: Diagnosis not present

## 2016-05-21 DIAGNOSIS — L97523 Non-pressure chronic ulcer of other part of left foot with necrosis of muscle: Secondary | ICD-10-CM | POA: Diagnosis not present

## 2016-05-25 ENCOUNTER — Ambulatory Visit (INDEPENDENT_AMBULATORY_CARE_PROVIDER_SITE_OTHER): Payer: Medicare HMO

## 2016-05-25 ENCOUNTER — Telehealth: Payer: Self-pay

## 2016-05-25 ENCOUNTER — Telehealth: Payer: Self-pay | Admitting: Cardiology

## 2016-05-25 DIAGNOSIS — Z95810 Presence of automatic (implantable) cardiac defibrillator: Secondary | ICD-10-CM | POA: Diagnosis not present

## 2016-05-25 DIAGNOSIS — I5022 Chronic systolic (congestive) heart failure: Secondary | ICD-10-CM | POA: Diagnosis not present

## 2016-05-25 NOTE — Telephone Encounter (Signed)
Received records from [facility] for appointment on 05/28/16 with Dr Martinique.  Records put with Dr Doug Sou schedule for 05/28/16. lp

## 2016-05-25 NOTE — Progress Notes (Signed)
EPIC Encounter for ICM Monitoring  Patient Name: Nathaniel Bolton is a 76 y.o. male Date: 05/25/2016 Primary Care Physican: [name], NP Primary [place] Electrophysiologist: Allred Nephrologist: Dr. Gifford Shave- [facility] Dry Weight:unknown  Battery Voltage (RRT=2.63V) 25-May-2016 Voltage 2.61 V      Attempted call to wife and unable to reach. Transmission reviewed.    Thoracic impedance is normal but has days that are abnormal suggesting fluid accumulation in last month.  Impedance shows improvement since insertion of Barostim in March.   Prescribed and confirmed dosage: Furosemide 40 mg twice daily  Labs: 03/17/2016 Creatinine 1.41, BUN 24, Potassium 3.5, Sodium 136, EGFR 48-55 03/14/2016 Creatinine 1.54, BUN 35, Potassium 4.2, Sodium 140, EGFR 43-50  01/03/2016 Creatinine 1.52, BUN 23, Potassium 4.5, Sodium 144, EGFR 44-51 12/19/2015 Creatinine 1.36, BUN 24, Potassium 4.7, Sodium 138, EGFR 51-59  09/15/2015 Creatinine 1.28, BUN 24, Potassium 4.2, Sodium 139  03/02/2015 Creatinine 1.58, BUN 34, Potassium 4.5, Sodium 135   Recommendations: NONE - Unable to reach wife  Follow-up plan: ICM clinic phone appointment on 06/25/2016.  Office appointment scheduled on 05/28/2016 with Dr Martinique.  Copy of ICM check sent to primary cardiologist and device physician.   3 month ICM trend: 05/25/2016   1 Year ICM trend:      [name], RN 05/25/2016 9:13 AM

## 2016-05-25 NOTE — Telephone Encounter (Signed)
Remote ICM transmission received.  Attempted call to wife and voice mail has not been set up yet

## 2016-05-26 NOTE — Progress Notes (Signed)
Nathaniel Bolton Date of Birth: March 02, 1940 Medical Record #[id number]  History of Present Illness: Nathaniel Bolton is seen back today for followup of congestive heart failure.  He has a history of congestive heart failure with ejection fraction of 27%. He also has a history of coronary disease with remote coronary bypass surgery. He has a history of ventricular tachycardia and is s/p ICD implant.  In August 2016 he had a sustained episode of VT at 175 bpm. This did not exceed his rate limit and this was later adjusted. No recurrence. He also has a history of DM, CKD III, HTN, HLD, ED, OLD, atrial fib (CHA2DS2-VASc=7, DM, HTN, CAD, CHF, age x 1, CVA x 2) on Eliquis, CABG, VT, MDT ICD, ICM w/ EF 27% by MV 2012 (25-30% by echo 2012). Most recent Echo here in February shows EF 30%. Echo at the [facility] in April 2018 showed EF 25-30% with inferolateral and anterolateral akinesis and apical dyskinesis. Mild to moderate MR with tethering of the posterior leaflet.   He was admitted July 2017 with syncope and VT at [facility]. Was loaded with amiodarone. Cardiac cath done that showed EF 20%. Patent LIMA to LAD, patent SVG to RCA and occluded SVG to OMs. This was discussed with Dr. Roxy Manns as to whether he would be a redo surgery candidate and review of his initial op note in 2002 he felt that this was not an option. I personally reviewed his cath films. He has a patent SVG to PDA and LIMA to a diagonal branch. The LCx has a 90% ostial stenosis. The mid LCx and the OMs are occluded. No good target for PCI.  He has persistent evidence of fluid overload by Optivol. Diuretic therapy has been variable.  We attempted to start him on Entresto but he was unable to afford.  His nephrologist stopped lisinopril in July 2017. We later added losartan. He was  enrolled in the Barostim trial and had this placed in March 2018.   On follow up today he  Notes significant improvement in exercise duration and dyspnea since Barostim  placed. He states he feels like a 76 year old on the inside. Denies any chest pain. No gout. Wife states everyone notices his color is much better.  Thoracic impedence has also improved. He was treated for viral PNA and bronchitis in April. He is approaching ERI for his ICD and is followed by Dr. Rayann Heman.   Current Outpatient Prescriptions on File Prior to Visit  Medication Sig Dispense Refill  . allopurinol (ZYLOPRIM) 100 MG tablet Take 100 mg by mouth daily.     Marland Kitchen amiodarone (PACERONE) 200 MG tablet Take 1 tablet (200 mg total) by mouth daily. 90 tablet 3  . apixaban (ELIQUIS) 5 MG TABS tablet Take 1 tablet (5 mg total) by mouth 2 (two) times daily. 60 tablet 0  . Ascorbic Acid (VITAMIN C) 1000 MG tablet Take 1,000 mg by mouth daily.    Marland Kitchen atorvastatin (LIPITOR) 40 MG tablet Take 40 mg by mouth daily at 6 PM.     . carvedilol (COREG) 6.25 MG tablet Take 1 tablet by mouth 2 (two) times daily with a meal.     . Cholecalciferol (VITAMIN D3) 2000 units TABS Take 1 tablet by mouth 2 (two) times daily.    . furosemide (LASIX) 40 MG tablet Take 1 tablet (40 mg total) by mouth 2 (two) times daily. 180 tablet 3  . glipiZIDE (GLUCOTROL) 5 MG tablet Take 2.5  mg by mouth daily before breakfast.     . insulin aspart protamine- aspart (NOVOLOG MIX 70/30) (70-30) 100 UNIT/ML injection Inject 5-10 Units into the skin 3 (three) times daily as needed (IF blood sugar is <150).     Marland Kitchen losartan (COZAAR) 50 MG tablet Take 1 tablet (50 mg total) by mouth daily. 90 tablet 1  . NITROSTAT 0.4 MG SL tablet Place 0.4 mg under the tongue every 5 (five) minutes as needed for chest pain (MAX 3 TABLETS).     . NON FORMULARY Needles, syringes with needles, glucose reagent test strips    . Omega-3 Fatty Acids (FISH OIL PO) Take 2 tablets by mouth 2 (two) times daily.     Marland Kitchen omeprazole (PRILOSEC) 40 MG capsule Take 40 mg by mouth daily.     . Saw Palmetto, Serenoa repens, (SAW PALMETTO PO) Take 1 tablet by mouth daily.     .  sertraline (ZOLOFT) 50 MG tablet Take 50 mg by mouth daily.     . vitamin E 400 UNIT capsule Take 1 capsule by mouth daily.      No current facility-administered medications on file prior to visit.     Allergies  Allergen Reactions  . Adhesive [Tape] Itching and Rash    Testosterone patch adhesive    Past Medical History:  Diagnosis Date  . CAD (coronary artery disease)   . CKD stage 3 due to type 2 diabetes mellitus ([place])   . Diabetes mellitus    Type 2  . Erectile dysfunction   . Gout   . Hyperlipidemia   . Hypertension   . ICD (implantable cardiac defibrillator) in place   . Myocardial infarction, old    x2 in [place]. S/P CABG 2003, Dr Roxy Manns  . Obstructive lung disease (HCC)    Moderate per prior PFT's  . Paroxysmal atrial fibrillation ([place]) 3/17   discovered on event monitor  . Stroke [facility])    2013  . Systolic CHF, acute on chronic (HCC)    EF is 27% per myoview and echo October 2012  . Ventricular tachycardia ([place]) 08/2014   175 bpm    Past Surgical History:  Procedure Laterality Date  . BYPASS GRAFT     x5  . CARDIAC DEFIBRILLATOR PLACEMENT  2008   Medronic by Dr Elonda Husky at [facility]  . cataract surgery    . CHOLECYSTECTOMY    . COLONOSCOPY    . KNEE ARTHROSCOPY     right  . UMBILICAL HERNIA REPAIR      History  Smoking Status  . Former Smoker  . Quit date: 1991  Smokeless Tobacco  . Never Used    History  Alcohol Use No    Family History  Problem Relation Age of Onset  . Heart failure Mother   . Heart attack Father   . Heart disease Sister        valve replaced  . Heart attack Brother        CABG, aortic grafting    Review of Systems: The review of systems is as noted in history of present illness. All other systems were reviewed and are negative.  Physical Exam: BP 120/60   Pulse (!) 50   Ht 5\' 10"  (1.778 m)   Wt 179 lb (81.2 kg)   BMI 25.68 kg/m  Patient is very pleasant and in no acute distress. Skin is warm and  dry. Color is normal.   Normocephalic/atraumatic. PERRL. Sclera are nonicteric.  Neck is supple. No masses. Mild JVD. Lungs are clear. Cardiac exam shows a regular rate and rhythm. Normal S1-2/ There is no gallop or murmur. Abdomen is soft, obese, and nontender. Extremities reveal no pretibial edema. Gait and ROM are intact. Right ankle swollen from prior fracture. No pitting edema.  No gross neurologic deficits noted.   LABORATORY DATA:   Lab Results  Component Value Date   WBC 7.2 03/17/2016   HGB 9.9 (L) 03/17/2016   HCT 30.6 (L) 03/17/2016   PLT 175 03/17/2016   GLUCOSE 131 (H) 03/17/2016   ALT 20 03/14/2016   AST 25 03/14/2016   NA 136 03/17/2016   K 3.5 03/17/2016   CL 102 03/17/2016   CREATININE 1.41 (H) 03/17/2016   BUN 24 (H) 03/17/2016   CO2 25 03/17/2016   INR 1.08 03/14/2016   HGBA1C 6.3 (H) 03/14/2016   Labs dated 02/27/16: cholesterol 105, triglycerides 107, HDL 28, LDL 56, BUN 27, creatinine 1.38. Remainder of CMET normal.   Labs from [facility] nephrologist: dated 10/13/15: Hgb 12.7. BUN 26, creatinine 1.37, other chemistries normal.   Labs from [facility] 05/15/16: Hgb 12.7. A1c 7.2&. BUN 24, creatinine 1.59. TSH 1.69. Uric acid 6.5.   Echo 02/20/16: Study Conclusions  - Left ventricle: The cavity size was moderately dilated. Wall   thickness was normal. - Mitral valve: There was mild to moderate regurgitation. Valve   area by pressure half-time: 2.42 cm^2. Valve area by continuity   equation (using LVOT flow): 0.99 cm^2. - Left atrium: The atrium was severely dilated. - Right ventricle: Systolic function was moderately reduced. - Right atrium: The atrium was moderately dilated.  Echo from [place] in April showed EF 25-30%. Using Definity there was evidence of an apical thrombus.   Assessment / Plan: 1. Chronic systolic CHF. EF 25- 30%. Clinically much improved with Barostim device. Continue carvedilol and losartan. Will continue lasix  40 mg bid.   Stressed importance of  sodium restriction. [facility] Renal function has been stable.  I suspect he does have some diuretic resistance related to his CKD.   2.  Status post CVA.  3. Coronary disease with remote myocardial infarctions in 1980 and 1990. Status post CABG. Patient is without anginal symptoms. Myoview study in October of 2012 showed evidence of an old infarct without ischemia. Cath in 2017 at [facility] noted above. Occluded SVGs to OMs. Not a candidate for surgical revascularization or PCI. He is asymptomatic. Continue medical therapy.  4. Status post ICD implant. ICD check Mar 02, 1015 was satisfactory. History of VT in August 2016 without recurrence. Approaching ERI.   5. CKD stage 3.  6. DM  on insulin- reviewed recommendations for low Carb diet.  7. Atrial fibrillation, paroxysmal. Diagnosed on EP follow up in April. Now on Eliquis. Continue amiodarone. Rate controlled.   8. HTN- controlled.  9. Apical thrombus. I suspect this is chronic. On Eliquis.   Follow up in 4 months.   Kiosha Buchan Martinique MD, [facility] 05/28/2016

## 2016-05-28 ENCOUNTER — Ambulatory Visit (INDEPENDENT_AMBULATORY_CARE_PROVIDER_SITE_OTHER): Payer: Medicare HMO | Admitting: Cardiology

## 2016-05-28 ENCOUNTER — Encounter: Payer: Self-pay | Admitting: Cardiology

## 2016-05-28 VITALS — BP 120/60 | HR 50 | Ht 70.0 in | Wt 179.0 lb

## 2016-05-28 DIAGNOSIS — I2510 Atherosclerotic heart disease of native coronary artery without angina pectoris: Secondary | ICD-10-CM

## 2016-05-28 DIAGNOSIS — I5022 Chronic systolic (congestive) heart failure: Secondary | ICD-10-CM | POA: Diagnosis not present

## 2016-05-28 DIAGNOSIS — I472 Ventricular tachycardia, unspecified: Secondary | ICD-10-CM

## 2016-05-28 DIAGNOSIS — Z95810 Presence of automatic (implantable) cardiac defibrillator: Secondary | ICD-10-CM

## 2016-05-28 DIAGNOSIS — I255 Ischemic cardiomyopathy: Secondary | ICD-10-CM | POA: Diagnosis not present

## 2016-05-28 DIAGNOSIS — N189 Chronic kidney disease, unspecified: Secondary | ICD-10-CM | POA: Diagnosis not present

## 2016-05-28 DIAGNOSIS — E1129 Type 2 diabetes mellitus with other diabetic kidney complication: Secondary | ICD-10-CM | POA: Diagnosis not present

## 2016-05-28 DIAGNOSIS — I509 Heart failure, unspecified: Secondary | ICD-10-CM | POA: Diagnosis not present

## 2016-05-28 DIAGNOSIS — I10 Essential (primary) hypertension: Secondary | ICD-10-CM | POA: Diagnosis not present

## 2016-05-28 DIAGNOSIS — I2583 Coronary atherosclerosis due to lipid rich plaque: Secondary | ICD-10-CM

## 2016-05-28 NOTE — Patient Instructions (Signed)
Continue your current therapy  I will see you again in 4 months. 

## 2016-05-29 DIAGNOSIS — M25571 Pain in right ankle and joints of right foot: Secondary | ICD-10-CM | POA: Diagnosis not present

## 2016-05-29 DIAGNOSIS — S82831A Other fracture of upper and lower end of right fibula, initial encounter for closed fracture: Secondary | ICD-10-CM | POA: Diagnosis not present

## 2016-06-07 ENCOUNTER — Ambulatory Visit: Payer: Medicare HMO | Admitting: Cardiology

## 2016-06-11 ENCOUNTER — Encounter: Payer: Self-pay | Admitting: *Deleted

## 2016-06-11 DIAGNOSIS — Z006 Encounter for examination for normal comparison and control in clinical research program: Secondary | ICD-10-CM

## 2016-06-11 NOTE — Addendum Note (Signed)
Addendum  created 06/11/16 1220 by Oleta Mouse, MD   Sign clinical note

## 2016-06-11 NOTE — Progress Notes (Signed)
Subject in for BeAT-HF Study visit 3 months. Verbalizes medication and diet compliance. Denies any signs or symptoms of heart failure exacerbation since last research study visit. Weight stable. Right neck and right chest Barostim neo and lead implant sites healed well. Verbalized endurance is steadily increasing, walking greater distance without shortness of breath. Will contact study coordinator with any study related questions. Will follow up with research for 6 month appointment.

## 2016-06-25 ENCOUNTER — Telehealth: Payer: Self-pay

## 2016-06-25 ENCOUNTER — Ambulatory Visit (INDEPENDENT_AMBULATORY_CARE_PROVIDER_SITE_OTHER): Payer: Medicare HMO

## 2016-06-25 ENCOUNTER — Telehealth: Payer: Self-pay | Admitting: Cardiology

## 2016-06-25 DIAGNOSIS — I5022 Chronic systolic (congestive) heart failure: Secondary | ICD-10-CM | POA: Diagnosis not present

## 2016-06-25 DIAGNOSIS — Z95810 Presence of automatic (implantable) cardiac defibrillator: Secondary | ICD-10-CM

## 2016-06-25 NOTE — Telephone Encounter (Signed)
Remote ICM transmission received.  Attempted patient call and left message to return call.   

## 2016-06-25 NOTE — Telephone Encounter (Signed)
Spoke w/ pt and informed him that his device has reached ERI and that a scheduler will be in contact with him to schedule an appt w/ MD / NP / PA. Pt and his wife verbalized understanding.

## 2016-06-25 NOTE — Progress Notes (Addendum)
EPIC Encounter for ICM Monitoring  Patient Name: Nathaniel Bolton is a 76 y.o. male Date: 06/25/2016 Primary Care Physican: [name], NP Primary [place] Electrophysiologist: Allred [facility]phrolog[facility] [place] (3)  RRT (25-Jun-2016): REPLACE DEVICE. Less than 3 months to EOS.   Alert: RRT, battery voltage low.   VF detection may be delayed: VF Detection Interval is faster than 300 ms (200 bpm).        Attempted call to wife and unable to reach.  Left message to return call.  Transmission reviewed.      Thoracic impedance normal.  Impedance shows improvement since insertion of Barostim in March.  Prescribed and confirmed dosage: Furosemide 40 mg 1 tablet twice a day  Labs: 03/17/2016 Creatinine 1.41, BUN 24, Potassium 3.5, Sodium 136, EGFR 48-55 03/14/2016 Creatinine 1.54, BUN 35, Potassium 4.2, Sodium 140, EGFR 43-50  01/03/2016 Creatinine 1.52, BUN 23, Potassium 4.5, Sodium 144, EGFR 44-51 12/19/2015 Creatinine 1.36, BUN 24, Potassium 4.7, Sodium 138, EGFR 51-59  09/15/2015 Creatinine 1.28, BUN 24, Potassium 4.2, Sodium 139  03/02/2015 Creatinine 1.58, BUN 34, Potassium 4.5, Sodium 135   Recommendations: NONE - Unable to reach patient   Follow-up plan: ICM clinic phone appointment on 07/26/2016.   Copy of ICM check sent to primary cardiologist and device physician.   3 month ICM trend: 06/25/2016   1 Year ICM trend:      [name], RN 06/25/2016 12:21 PM

## 2016-06-26 NOTE — Progress Notes (Signed)
Spoke with wife. She stated patient is doing well.  Office appointment scheduled for 07/12/2016 to discuss changing the battery and setting up procedure.  Transmission reviewed and next scheduled one is 07/26/2016.  Advised her that date may change depending on when the procedure gets scheduled to replace the battery. Encouraged to call for any changes.

## 2016-06-27 ENCOUNTER — Telehealth: Payer: Self-pay | Admitting: *Deleted

## 2016-06-27 NOTE — Telephone Encounter (Signed)
Wife calling concerned because ICD alert toned again this morning. I made her aware that the alert tone will sound each day at the same time until he is seen in the office to have alert turned off. I offered her an appointment for Nathaniel Bolton to have alert turned off today, she said that she was ok as Nathaniel Bolton as she knew what it was. She appreciates the information.

## 2016-07-10 ENCOUNTER — Telehealth: Payer: Self-pay | Admitting: *Deleted

## 2016-07-10 MED ORDER — APIXABAN 5 MG PO TABS: 5.0000 mg | ORAL_TABLET | Freq: Two times a day (BID) | ORAL | 0 refills | Status: DC

## 2016-07-10 NOTE — Telephone Encounter (Signed)
Patient requesting samples of ELIQUIS 5MG .  1 BOX SAMPLE GIVEN   PATIENT RECEIVED.

## 2016-07-11 NOTE — Progress Notes (Addendum)
Cardiology Office Note Date:  07/12/2016  Patient ID:  Nathaniel, Bolton 1940/12/22, MRN [id number] PCP:  [name], NP  Cardiologist:  Dr. Martinique Electrophysiologist: Dr. Rayann Heman   Chief Complaint: device is at ERI  History of Present Illness: Nathaniel Bolton is a 76 y.o. male with history of CAD/CABG, ICM with ICD, VT, DM, HTN, HLD, CRI, COPD, and CVA , PAFib, s/p Barostim implant  He comes to the office today to be seen for  Dr. Rayann Heman, last seen by him in April, at that visit doing well s/p Barostim implant  He is feeling well.  Reports since the Barostim implant he feels like he has been rejuvenated.  He denies any kind of CP, no rest SOB, occasionally will see he has gained weight/feels boated and will use an extra lasix for a day or so until back to 175-176 (AM home weight), but reports never really feels like he gets SOB, he denies any dizziness, near syncope or syncope, no symptoms of PND or orthopnea.  He has not had any bleeding or signs of bleeding.    DEVICE information:  MDT single chamber ICD implanted 12/25/07, Dr. Rayann Heman  VT history: 09/01/14:  VT at 175/min. He was initially below his rate detection cutoff and no therapy was delivered. The patient then accelerated into the VF zone and was treated with appropriate therapy.ATP did not terminate the arrhythmia and a single ICD shock restored NSR. Device was re-programmed to include a VT zone at 170/min and include 4 burst and 4 ramp pacing attempts before delivering a shock.    Past Medical History:  Diagnosis Date  . CAD (coronary artery disease)   . CKD stage 3 due to type 2 diabetes mellitus ([place])   . Diabetes mellitus    Type 2  . Erectile dysfunction   . Gout   . Hyperlipidemia   . Hypertension   . ICD (implantable cardiac defibrillator) in place   . Myocardial infarction, old    x2 in [place]. S/P CABG 2003, Dr Roxy Manns  . Obstructive lung disease (HCC)    Moderate per prior PFT's  . Paroxysmal  atrial fibrillation ([place]) 3/17   discovered on event monitor  . Stroke [facility])    2013  . Systolic CHF, acute on chronic (HCC)    EF is 27% per myoview and echo October 2012  . Ventricular tachycardia ([place]) 08/2014   175 bpm    Past Surgical History:  Procedure Laterality Date  . BYPASS GRAFT     x5  . CARDIAC DEFIBRILLATOR PLACEMENT  2008   [facility] by Dr Elonda Husky [facility]  . cataract surgery    . CHOLECYSTECTOMY    . COLONOSCOPY    . KNEE ARTHROSCOPY     right  . UMBILICAL HERNIA REPAIR      Current Outpatient Prescriptions  Medication Sig Dispense Refill  . allopurinol (ZYLOPRIM) 100 MG tablet Take 100 mg by mouth daily.     Marland Kitchen amiodarone (PACERONE) 200 MG tablet Take 1 tablet (200 mg total) by mouth daily. 90 tablet 3  . apixaban (ELIQUIS) 5 MG TABS tablet Take 1 tablet (5 mg total) by mouth 2 (two) times daily. 14 tablet 0  . Ascorbic Acid (VITAMIN C) 1000 MG tablet Take 1,000 mg by mouth daily.    Marland Kitchen atorvastatin (LIPITOR) 40 MG tablet Take 40 mg by mouth daily at 6 PM.     . carvedilol (COREG) 6.25 MG tablet  Take 1 tablet by mouth 2 (two) times daily with a meal.     . Cholecalciferol (VITAMIN D3) 2000 units TABS Take 1 tablet by mouth 2 (two) times daily.    . furosemide (LASIX) 40 MG tablet Take 1 tablet (40 mg total) by mouth 2 (two) times daily. 180 tablet 3  . glipiZIDE (GLUCOTROL) 5 MG tablet Take 2.5 mg by mouth daily before breakfast.     . insulin aspart protamine- aspart (NOVOLOG MIX 70/30) (70-30) 100 UNIT/ML injection Inject 5-10 Units into the skin 3 (three) times daily as needed (IF blood sugar is <150).     Marland Kitchen losartan (COZAAR) 50 MG tablet Take 1 tablet (50 mg total) by mouth daily. 90 tablet 1  . NITROSTAT 0.4 MG SL tablet Place 0.4 mg under the tongue every 5 (five) minutes as needed for chest pain (MAX 3 TABLETS).     . NON FORMULARY Needles, syringes with needles, glucose reagent test strips    . Omega-3 Fatty Acids (FISH OIL PO) Take 2 tablets  by mouth 2 (two) times daily.     Marland Kitchen omeprazole (PRILOSEC) 40 MG capsule Take 40 mg by mouth daily.     . Saw Palmetto, Serenoa repens, (SAW PALMETTO PO) Take 1 tablet by mouth daily.     . sertraline (ZOLOFT) 50 MG tablet Take 50 mg by mouth daily.     . vitamin E 400 UNIT capsule Take 1 capsule by mouth daily.      No current facility-administered medications for this visit.     Allergies:   Adhesive [tape]   Social History:  The patient  reports that he quit smoking about 27 years ago. He has never used smokeless tobacco. He reports that he does not drink alcohol or use drugs.   Family History:  The patient's family history includes Heart attack in his brother and father; Heart disease in his sister; Heart failure in his mother.  ROS:  Please see the history of present illness.   All other systems are reviewed and otherwise negative.   PHYSICAL EXAM:  VS:  BP 138/60   Pulse (!) 56   Ht 5\' 10"  (1.778 m)   Wt 182 lb (82.6 kg)   BMI 26.11 kg/m  BMI: Body mass index is 26.11 kg/m. Well nourished, well developed, in no acute distress  HEENT: normocephalic, atraumatic  Neck: no JVD, carotid bruits or masses Cardiac:  RRR; no significant murmurs, no rubs, or gallops Lungs:  CTA b/l, no wheezing, rhonchi or rales  Abd: soft, nontender MS: no deformity or atrophy Ext:  trace edema  Skin: warm and dry, no rash Neuro:  No gross deficits appreciated Psych: euthymic mood, full affect  ICD site (left) is stable, no tethering or discomfort Barostim site (right) is stable, no tethering or discomfort   EKG:  Done today and reviewed by myself: HR 56bpm, barostim artifact ICD interrogation today by industry and reviewed by myself: battery reached ERI 06/25/16, lead measurements stable, no arrhythmias, OptiVol is well below threshold  04/18/16: TTE (from [place]) LVEF 25-30% + WMA, anterolateral and inferolateral nearly akinetic extends to the mid apical inferior wall.   Apex is dyskinetic LA  severely dilated Mod MR  11/02/15: Echocardiogram Study Conclusions  Left ventricle: Diffuse hypokinesis with inferior wall akinesis The cavity size was moderately dilated. Wall thickness was normal. Systolic function was severely reduced. The estimated ejection fraction was in the range of 25% to 30%. Doppler parameters are consistent with abnormal  left ventricular relaxation (grade 1 diastolic dysfunction). - Left atrium: The atrium was mildly to moderately dilated. - Atrial septum: No defect or patent foramen ovale was identified.  Recent Labs: 09/15/2015: Brain Natriuretic Peptide 98.3 03/14/2016: ALT 20 03/17/2016: BUN 24; Creatinine, Ser 1.41; Hemoglobin 9.9; Platelets 175; Potassium 3.5; Sodium 136  No results found for requested labs within last 8760 hours.   CrCl cannot be calculated (Patient's most recent lab result is older than the maximum 21 days allowed.).   Wt Readings from Last 3 Encounters:  07/12/16 182 lb (82.6 kg)  06/11/16 182 lb (82.6 kg)  05/28/16 179 lb (81.2 kg)     Other studies reviewed: Additional studies/records reviewed today include: summarized above    ASSESSMENT AND PLAN:  1. ICM/ICD/VT/chronic CHF (systolic)     On BB/ACE, diuretic tx     no recurrent VT     His weight is up 3 lbs, his AM home weight was up as well.     His exam does not suggest overt fluid OL, Optivol looks good, he has already taken an extra lasix dose today given home weight this AM     continue q 73mo [facility] and ICM clinic     Continues using 2x week additional lasix,this regime appears to keep him compensated     unable to afford Entresto     Has a barostim device  Device has reached ERI.  Generator change procedure, risks/benefits were discussed with the patient today, he would like to proceed, he was instructed to hold his Eliquis for 2 days prior to the procedure  I discussed with Maggie in [employer] (covering for [facility]), no need for research/barostim  rep to be here today for ICD interrogation, Burman Nieves will arrange for research personnel and Barostim rep to be present at generator change procedure.  2. CAD     On ASA, statin, BB     no anginal symptoms  3. HTN     Appears well controlled, no changes  4. Paroxysmal AF     CHA2Ds2Vasc is at least 6, on Eliquis  Disposition: Generator change to be scheduled in the next few weeks, BMET/CBC today, 10 day wound check.  Current medicines are reviewed at length with the patient today.  The patient did not have any concerns regarding medicines.  Haywood Lasso, PA-C 07/12/2016 1:52 PM     [place] [place] [phone] (office)  [phone] (fax)

## 2016-07-12 ENCOUNTER — Encounter: Payer: Self-pay | Admitting: Physician Assistant

## 2016-07-12 ENCOUNTER — Ambulatory Visit (INDEPENDENT_AMBULATORY_CARE_PROVIDER_SITE_OTHER): Payer: Medicare HMO | Admitting: Physician Assistant

## 2016-07-12 ENCOUNTER — Encounter: Payer: Self-pay | Admitting: *Deleted

## 2016-07-12 ENCOUNTER — Encounter (INDEPENDENT_AMBULATORY_CARE_PROVIDER_SITE_OTHER): Payer: Self-pay

## 2016-07-12 VITALS — BP 138/60 | HR 56 | Ht 70.0 in | Wt 182.0 lb

## 2016-07-12 DIAGNOSIS — I4891 Unspecified atrial fibrillation: Secondary | ICD-10-CM | POA: Diagnosis not present

## 2016-07-12 DIAGNOSIS — I5022 Chronic systolic (congestive) heart failure: Secondary | ICD-10-CM | POA: Diagnosis not present

## 2016-07-12 DIAGNOSIS — I480 Paroxysmal atrial fibrillation: Secondary | ICD-10-CM | POA: Diagnosis not present

## 2016-07-12 DIAGNOSIS — I2510 Atherosclerotic heart disease of native coronary artery without angina pectoris: Secondary | ICD-10-CM

## 2016-07-12 DIAGNOSIS — I255 Ischemic cardiomyopathy: Secondary | ICD-10-CM

## 2016-07-12 DIAGNOSIS — I10 Essential (primary) hypertension: Secondary | ICD-10-CM | POA: Diagnosis not present

## 2016-07-12 NOTE — Patient Instructions (Addendum)
Medication Instructions:   Your physician recommends that you continue on your current medications as directed. Please refer to the Current Medication list given to you today.   If you need a refill on your cardiac medications before your next appointment, please call your pharmacy.  Labwork: RETURN FOR CBC BMET PT/INR ON 7- 17-2018    Testing/Procedures:  SEE LETTER FOR GEN CHANGE ON 07-31-2016    Follow-Up: AFTER 7-24- 2018  10 DAY  POST WOUND CHECK WITH PROVIDER  90 DAYS WITH DR Orland Mustard DEFIB CHK    Any Other Special Instructions Will Be Listed Below (If Applicable).

## 2016-07-19 ENCOUNTER — Telehealth: Payer: Self-pay

## 2016-07-19 NOTE — Telephone Encounter (Signed)
Returned call to patient as his wife called and left message stating that he was outside on 7-10 doing a project and tripped over his material and fell forward. No LOC. States he did not hit his head. States he put his hands up to cover his devices. No pain with ambulation. Only pain is around Barostim neo device, again states he put his hand up to protect it and feels it is bruised on the "inside." Denies any other symptoms. Did not seek medical attention the day the incident occurred.   Writer called [facility] to report incident and inquire if Barostim neo warranted device interrogation. Renita Papa states this minor trauma  would not cause malfunction. Most likely as patient states, he is sore from falling onto device.   Patient does not want to come in at current time, states he will take Tylenol as needed and will seek medical attention if needed. Very pleasant.

## 2016-07-24 ENCOUNTER — Other Ambulatory Visit: Payer: Medicare HMO

## 2016-07-25 DIAGNOSIS — L03116 Cellulitis of left lower limb: Secondary | ICD-10-CM | POA: Diagnosis not present

## 2016-07-25 DIAGNOSIS — E1142 Type 2 diabetes mellitus with diabetic polyneuropathy: Secondary | ICD-10-CM | POA: Diagnosis not present

## 2016-07-25 DIAGNOSIS — L97522 Non-pressure chronic ulcer of other part of left foot with fat layer exposed: Secondary | ICD-10-CM | POA: Diagnosis not present

## 2016-07-26 ENCOUNTER — Telehealth: Payer: Self-pay | Admitting: Cardiology

## 2016-07-26 NOTE — Telephone Encounter (Signed)
Patient wife called and stated that patient has an infection in his foot and she wants to know if this will interfere with the device change out that is scheduled for 07-31-16. Please call her back on Friday 07-27-2016. Informed her I would send MD nurse and message and have her call her back on Friday 07-27-2016. Pt wife verbalized understanding.

## 2016-07-26 NOTE — Telephone Encounter (Signed)
Spoke to Dr.Tilles about foot infection. Dr.Tilles says that right now he can only say that it's a soft tissue infection bc it's too early to tell if there is bone involvement. He said that he will be doing an xray at patient's f/u on 7/25. Dr.Tilles plans to call back with findings on 7/25. Will inform Dr.Allred of information.

## 2016-07-26 NOTE — Telephone Encounter (Signed)
Spoke to Dr.Allred about patient's infection and whether or not he needed to reschedule gen change. Dr.Allred wants to know whether or not the infection is just w/soft tissue or if there is osteo involvement.   Attempted to call patient to see if he was aware of this information-patient's phone number on file is d/c'd.  Called [facility] and left a message with Dr.Tilles' RN to call back with this information. Direct number for the device clinic given.

## 2016-07-27 NOTE — Progress Notes (Signed)
No ICM remote transmission received for 07/26/2016 and next ICM transmission scheduled for 10/02/2016 due to patient is having a generator change on 07/27/2016.

## 2016-07-27 NOTE — Telephone Encounter (Signed)
Patient's wife called and would like an update.  Advised I will forward to [facility], Dr. Jackalyn Lombard nurse.  Patient's wife is Patent attorney.

## 2016-07-27 NOTE — Telephone Encounter (Signed)
Spoke with wife and she says his ote looks much better and he has a follow up on Mon.  She says he gets these off and on due to his DM.  He has a follow up on Mon to see MD to look at progress.  We will make a decision on Mon about his procedure

## 2016-07-30 ENCOUNTER — Encounter: Payer: Self-pay | Admitting: Internal Medicine

## 2016-07-30 DIAGNOSIS — L03116 Cellulitis of left lower limb: Secondary | ICD-10-CM | POA: Diagnosis not present

## 2016-07-30 DIAGNOSIS — L97522 Non-pressure chronic ulcer of other part of left foot with fat layer exposed: Secondary | ICD-10-CM | POA: Diagnosis not present

## 2016-07-30 NOTE — Telephone Encounter (Signed)
Just left the foot doctor's office and we are going to reschedule for the end of Aug.

## 2016-07-30 NOTE — Telephone Encounter (Signed)
New Message ° ° pt wife verbalized that she is returning call for rn °

## 2016-07-30 NOTE — Telephone Encounter (Signed)
Left message for wife that I was calling to see how his foot is doing.

## 2016-07-31 NOTE — Telephone Encounter (Signed)
Dionicio Stall, RN at 07/30/2016 5:01 PM   Status: Signed    Just left the foot doctor's office and we are going to reschedule for the end of Aug.    Charlett Nose K at 07/30/2016 4:43 PM   Status: Signed     New Message   pt wife verbalized that she is returning call for rn     Generator change has been scheduled for 09/04/16

## 2016-07-31 NOTE — Telephone Encounter (Signed)
This encounter was created in error - please disregard.

## 2016-08-02 ENCOUNTER — Telehealth: Payer: Self-pay

## 2016-08-02 NOTE — Telephone Encounter (Signed)
Spoke to patient's wife to reschedule Beat HF Study visit. She states she has had really bad URI, started antibiotic today. States patient is doing well. She will call me back at the beginning of the week to schedule as she hopes to feel better. Very pleasant.

## 2016-08-06 DIAGNOSIS — E1142 Type 2 diabetes mellitus with diabetic polyneuropathy: Secondary | ICD-10-CM | POA: Diagnosis not present

## 2016-08-06 DIAGNOSIS — L03116 Cellulitis of left lower limb: Secondary | ICD-10-CM | POA: Diagnosis not present

## 2016-08-06 DIAGNOSIS — L97522 Non-pressure chronic ulcer of other part of left foot with fat layer exposed: Secondary | ICD-10-CM | POA: Diagnosis not present

## 2016-08-07 ENCOUNTER — Telehealth: Payer: Self-pay

## 2016-08-07 NOTE — Telephone Encounter (Signed)
Call to patient, he is doing well. Wife, [name] is feeling much better today. BeAT-HF Study 6 month appointment re-scheduled for Aug 20th, 8am.

## 2016-08-08 ENCOUNTER — Encounter: Payer: Self-pay | Admitting: Internal Medicine

## 2016-08-08 NOTE — Telephone Encounter (Signed)
Pt's wife called to rescheduled the ICD generator changed from 7/24 th to this month. Pt's wife was made aware that pt is scheduled for ICD generator changed on 8/23 rd at 3:00 PM. Pt and wife said that they didn't know. They would like to have implant device instructions of what time to be there, med's to or not to take and if he needs  Labs. Pt is aware that Claiborne Billings is  in the office next week. Pt verbalized understanding.

## 2016-08-08 NOTE — Telephone Encounter (Signed)
New message    Pt wife is calling about rescheduling procedure. Please call.

## 2016-08-09 NOTE — Telephone Encounter (Signed)
Lynann Bologna, RN at 08/08/2016 11:25 AM   Status: Signed    Pt's wife called to rescheduled the ICD generator changed from 7/24 th to this month. Pt's wife was made aware that pt is scheduled for ICD generator changed on 8/23 rd at 3:00 PM. Pt and wife said that they didn't know. They would like to have implant device instructions of what time to be there, med's to or not to take and if he needs  Labs. Pt is aware that Claiborne Billings is  in the office next week. Pt verbalized understanding.    Edwards, [place] P at 08/08/2016 11:03 AM   Status: Signed    New message    Pt wife is calling about rescheduling procedure. Please call.

## 2016-08-09 NOTE — Telephone Encounter (Signed)
This encounter was created in error - please disregard.

## 2016-08-13 ENCOUNTER — Ambulatory Visit: Payer: Medicare HMO

## 2016-08-15 DIAGNOSIS — L03116 Cellulitis of left lower limb: Secondary | ICD-10-CM | POA: Diagnosis not present

## 2016-08-15 DIAGNOSIS — L97522 Non-pressure chronic ulcer of other part of left foot with fat layer exposed: Secondary | ICD-10-CM | POA: Diagnosis not present

## 2016-08-16 ENCOUNTER — Telehealth: Payer: Self-pay | Admitting: Cardiology

## 2016-08-16 NOTE — Telephone Encounter (Signed)
Returned call to patient's wife.Eliquis 5 mg samples left at [employer] office front desk.

## 2016-08-16 NOTE — Telephone Encounter (Signed)
Returned call to Nathaniel Bolton and gave her the details for procedure on 08/30/16  Please arrive at [facility] at 1:30pm Do not eat or drink after 8am the morning of the procedure=--okay to have lite breakfast. Okay to take am medications the morning of the procedure--He does not take insulin daily. Will HOLD Eliquis 48 hours prior Will need someone to drive you home at discharge  She was given both wound check appointment and 3 month follow up with Dr Rayann Heman

## 2016-08-16 NOTE — Telephone Encounter (Signed)
Patient calling the office for samples of medication: ° ° °1.  What medication and dosage are you requesting samples for? °Eliquis 5 mg  °2.  Are you currently out of this medication?  °Yes ° ° ° °

## 2016-08-22 DIAGNOSIS — E1142 Type 2 diabetes mellitus with diabetic polyneuropathy: Secondary | ICD-10-CM | POA: Diagnosis not present

## 2016-08-22 DIAGNOSIS — L97521 Non-pressure chronic ulcer of other part of left foot limited to breakdown of skin: Secondary | ICD-10-CM | POA: Diagnosis not present

## 2016-08-22 DIAGNOSIS — L03116 Cellulitis of left lower limb: Secondary | ICD-10-CM | POA: Diagnosis not present

## 2016-08-22 DIAGNOSIS — E11621 Type 2 diabetes mellitus with foot ulcer: Secondary | ICD-10-CM | POA: Diagnosis not present

## 2016-08-27 DIAGNOSIS — Z006 Encounter for examination for normal comparison and control in clinical research program: Secondary | ICD-10-CM

## 2016-08-27 NOTE — Progress Notes (Signed)
Patient present for Beat 6 Month study visit. He states "I feel better than I have in years." He states he and his wife are walking daily. Verbalizes med and diet compliance. All 6 month study procedures completed without event. [facility] device representative Renita Papa present for device interrogation; device setting unchanged. Patient will return in 3 months for research visit.

## 2016-08-30 ENCOUNTER — Encounter (HOSPITAL_COMMUNITY)
Admission: RE | Disposition: A | Discharge status: Home / Self Care | Payer: Self-pay | Source: Ambulatory Visit | Attending: Internal Medicine

## 2016-08-30 ENCOUNTER — Ambulatory Visit (HOSPITAL_COMMUNITY)
Admission: RE | Admit: 2016-08-30 | Discharge: 2016-08-30 | Disposition: A | Discharge status: Home / Self Care | Payer: Medicare HMO | Source: Ambulatory Visit | Attending: Internal Medicine | Admitting: Internal Medicine

## 2016-08-30 DIAGNOSIS — I130 Hypertensive heart and chronic kidney disease with heart failure and stage 1 through stage 4 chronic kidney disease, or unspecified chronic kidney disease: Secondary | ICD-10-CM | POA: Insufficient documentation

## 2016-08-30 DIAGNOSIS — I5022 Chronic systolic (congestive) heart failure: Secondary | ICD-10-CM | POA: Insufficient documentation

## 2016-08-30 DIAGNOSIS — Z87891 Personal history of nicotine dependence: Secondary | ICD-10-CM | POA: Insufficient documentation

## 2016-08-30 DIAGNOSIS — I472 Ventricular tachycardia: Secondary | ICD-10-CM | POA: Diagnosis not present

## 2016-08-30 DIAGNOSIS — Z4502 Encounter for adjustment and management of automatic implantable cardiac defibrillator: Secondary | ICD-10-CM | POA: Diagnosis not present

## 2016-08-30 DIAGNOSIS — N183 Chronic kidney disease, stage 3 (moderate): Secondary | ICD-10-CM | POA: Diagnosis not present

## 2016-08-30 DIAGNOSIS — I509 Heart failure, unspecified: Secondary | ICD-10-CM | POA: Diagnosis not present

## 2016-08-30 DIAGNOSIS — J449 Chronic obstructive pulmonary disease, unspecified: Secondary | ICD-10-CM | POA: Diagnosis not present

## 2016-08-30 DIAGNOSIS — E785 Hyperlipidemia, unspecified: Secondary | ICD-10-CM | POA: Insufficient documentation

## 2016-08-30 DIAGNOSIS — I252 Old myocardial infarction: Secondary | ICD-10-CM | POA: Insufficient documentation

## 2016-08-30 DIAGNOSIS — I2510 Atherosclerotic heart disease of native coronary artery without angina pectoris: Secondary | ICD-10-CM | POA: Diagnosis not present

## 2016-08-30 DIAGNOSIS — Z7984 Long term (current) use of oral hypoglycemic drugs: Secondary | ICD-10-CM | POA: Insufficient documentation

## 2016-08-30 DIAGNOSIS — I255 Ischemic cardiomyopathy: Secondary | ICD-10-CM | POA: Insufficient documentation

## 2016-08-30 DIAGNOSIS — I480 Paroxysmal atrial fibrillation: Secondary | ICD-10-CM | POA: Diagnosis not present

## 2016-08-30 DIAGNOSIS — Z79899 Other long term (current) drug therapy: Secondary | ICD-10-CM | POA: Diagnosis not present

## 2016-08-30 DIAGNOSIS — Z7901 Long term (current) use of anticoagulants: Secondary | ICD-10-CM | POA: Diagnosis not present

## 2016-08-30 DIAGNOSIS — Z8673 Personal history of transient ischemic attack (TIA), and cerebral infarction without residual deficits: Secondary | ICD-10-CM | POA: Diagnosis not present

## 2016-08-30 HISTORY — PX: ICD GENERATOR CHANGEOUT: EP1231

## 2016-08-30 LAB — BASIC METABOLIC PANEL
ANION GAP: 9 (ref 5–15)
BUN: 29 mg/dL — ABNORMAL HIGH (ref 6–20)
CHLORIDE: 104 mmol/L (ref 101–111)
CO2: 26 mmol/L (ref 22–32)
CREATININE: 1.51 mg/dL — AB (ref 0.61–1.24)
Calcium: 8.9 mg/dL (ref 8.9–10.3)
GFR calc non Af Amer: 44 mL/min — ABNORMAL LOW (ref >60)
GFR, EST AFRICAN AMERICAN: 51 mL/min — AB (ref >60)
Glucose, Bld: 119 mg/dL — ABNORMAL HIGH (ref 65–99)
POTASSIUM: 3.6 mmol/L (ref 3.5–5.1)
SODIUM: 139 mmol/L (ref 135–145)

## 2016-08-30 LAB — CBC
HEMATOCRIT: 35 % — AB (ref 39.0–52.0)
HEMOGLOBIN: 11.7 g/dL — AB (ref 13.0–17.0)
MCH: 33.5 pg (ref 26.0–34.0)
MCHC: 33.4 g/dL (ref 30.0–36.0)
MCV: 100.3 fL — ABNORMAL HIGH (ref 78.0–100.0)
Platelets: 164 10*3/uL (ref 150–400)
RBC: 3.49 MIL/uL — AB (ref 4.22–5.81)
RDW: 13.7 % (ref 11.5–15.5)
WBC: 7.1 10*3/uL (ref 4.0–10.5)

## 2016-08-30 LAB — SURGICAL PCR SCREEN
MRSA, PCR: NEGATIVE
STAPHYLOCOCCUS AUREUS: NEGATIVE

## 2016-08-30 LAB — GLUCOSE, CAPILLARY: GLUCOSE-CAPILLARY: 116 mg/dL — AB (ref 65–99)

## 2016-08-30 SURGERY — ICD GENERATOR CHANGEOUT

## 2016-08-30 MED ORDER — CHLORHEXIDINE GLUCONATE 4 % EX LIQD: 60.0000 mL | Freq: Once | CUTANEOUS | Status: DC

## 2016-08-30 MED ORDER — SODIUM CHLORIDE 0.9% FLUSH: 3.0000 mL | Freq: Two times a day (BID) | INTRAVENOUS | Status: DC

## 2016-08-30 MED ORDER — LIDOCAINE HCL (PF) 1 % IJ SOLN
INTRAMUSCULAR | Status: AC
Start: 2016-08-30 — End: 2016-08-30
  Filled 2016-08-30: qty 60

## 2016-08-30 MED ORDER — SODIUM CHLORIDE 0.9 % IR SOLN
Status: AC
  Filled 2016-08-30: qty 2

## 2016-08-30 MED ORDER — CEFAZOLIN SODIUM-DEXTROSE 2-4 GM/100ML-% IV SOLN
2.0000 g | INTRAVENOUS | Status: AC
  Administered 2016-08-30: 2 g via INTRAVENOUS

## 2016-08-30 MED ORDER — FENTANYL CITRATE (PF) 100 MCG/2ML IJ SOLN
INTRAMUSCULAR | Status: AC
  Filled 2016-08-30: qty 2

## 2016-08-30 MED ORDER — MIDAZOLAM HCL 5 MG/5ML IJ SOLN
INTRAMUSCULAR | Status: DC | PRN
  Administered 2016-08-30: 2 mg via INTRAVENOUS

## 2016-08-30 MED ORDER — SODIUM CHLORIDE 0.9% FLUSH: 3.0000 mL | INTRAVENOUS | Status: DC | PRN

## 2016-08-30 MED ORDER — CEFAZOLIN SODIUM-DEXTROSE 2-4 GM/100ML-% IV SOLN
INTRAVENOUS | Status: AC
  Filled 2016-08-30: qty 100

## 2016-08-30 MED ORDER — MUPIROCIN 2 % EX OINT
TOPICAL_OINTMENT | CUTANEOUS | Status: AC
  Filled 2016-08-30: qty 22

## 2016-08-30 MED ORDER — FENTANYL CITRATE (PF) 100 MCG/2ML IJ SOLN
INTRAMUSCULAR | Status: DC | PRN
  Administered 2016-08-30: 25 ug via INTRAVENOUS

## 2016-08-30 MED ORDER — SODIUM CHLORIDE 0.9 % IV SOLN
INTRAVENOUS | Status: DC
  Administered 2016-08-30 (×2): via INTRAVENOUS

## 2016-08-30 MED ORDER — SODIUM CHLORIDE 0.9 % IR SOLN
80.0000 mg | Status: AC
  Administered 2016-08-30: 80 mg

## 2016-08-30 MED ORDER — MIDAZOLAM HCL 5 MG/5ML IJ SOLN
INTRAMUSCULAR | Status: AC
  Filled 2016-08-30: qty 5

## 2016-08-30 MED ORDER — ACETAMINOPHEN 325 MG PO TABS: 325.0000 mg | ORAL_TABLET | ORAL | Status: DC | PRN

## 2016-08-30 MED ORDER — SODIUM CHLORIDE 0.9 % IV SOLN
250.0000 mL | INTRAVENOUS | Status: DC | PRN
Start: 2016-08-30 — End: 2016-08-30

## 2016-08-30 MED ORDER — MUPIROCIN 2 % EX OINT
1.0000 "application " | TOPICAL_OINTMENT | Freq: Once | CUTANEOUS | Status: AC
  Administered 2016-08-30: 13:00:00 via TOPICAL
  Filled 2016-08-30: qty 22

## 2016-08-30 MED ORDER — ONDANSETRON HCL 4 MG/2ML IJ SOLN: 4.0000 mg | Freq: Four times a day (QID) | INTRAMUSCULAR | Status: DC | PRN

## 2016-08-30 SURGICAL SUPPLY — 5 items
CABLE SURGICAL S-101-97-12 (CABLE) ×2 IMPLANT
ICD VISIA AF VR DVAB1D1 (ICD Generator) IMPLANT
PAD DEFIB LIFELINK (PAD) ×2 IMPLANT
TRAY PACEMAKER INSERTION (PACKS) ×2 IMPLANT
VISIA AF VR DVAB1D1 (ICD Generator) ×3 IMPLANT

## 2016-08-30 NOTE — Discharge Instructions (Signed)
Resume eliquis on 09/02/16 evening dose  Pacemaker Battery Change, Care After This sheet gives you information about how to care for yourself after your procedure. Your health care provider may also give you more specific instructions. If you have problems or questions, contact your health care provider. What can I expect after the procedure? After your procedure, it is common to have:  Pain or soreness at the site where the pacemaker was inserted.  Swelling at the site where the pacemaker was inserted.  Follow these instructions at home: Incision care  Keep the incision clean and dry. ? Do not take baths, swim, or use a hot tub until your health care provider approves. ? You may shower the day after your procedure, or as directed by your health care provider. ? Pat the area dry with a clean towel. Do not rub the area. This may cause bleeding.  Follow instructions from your health care provider about how to take care of your incision. Make sure you: ? Wash your hands with soap and water before you change your bandage (dressing). If soap and water are not available, use hand sanitizer. ? Change your dressing as told by your health care provider. ? Leave stitches (sutures), skin glue, or adhesive strips in place. These skin closures may need to stay in place for 2 weeks or longer. If adhesive strip edges start to loosen and curl up, you may trim the loose edges. Do not remove adhesive strips completely unless your health care provider tells you to do that.  Check your incision area every day for signs of infection. Check for: ? More redness, swelling, or pain. ? More fluid or blood. ? Warmth. ? Pus or a bad smell. Activity  Do not lift anything that is heavier than 10 lb (4.5 kg) until your health care provider says it is okay to do so.  For the first 2 weeks, or as long as told by your health care provider: ? Avoid lifting your left arm higher than your shoulder. ? Be gentle when you  move your arms over your head. It is okay to raise your arm to comb your hair. ? Avoid strenuous exercise.  Ask your health care provider when it is okay to: ? Resume your normal activities. ? Return to work or school. ? Resume sexual activity. Eating and drinking  Eat a heart-healthy diet. This should include plenty of fresh fruits and vegetables, whole grains, low-fat dairy products, and lean protein like chicken and fish.  Limit alcohol intake to no more than 1 drink a day for non-pregnant women and 2 drinks a day for men. One drink equals 12 oz of beer, 5 oz of wine, or 1 oz of hard liquor.  Check ingredients and nutrition facts on packaged foods and beverages. Avoid the following types of food: ? Food that is high in salt (sodium). ? Food that is high in saturated fat, like full-fat dairy or red meat. ? Food that is high in trans fat, like fried food. ? Food and drinks that are high in sugar. Lifestyle  Do not use any products that contain nicotine or tobacco, such as cigarettes and e-cigarettes. If you need help quitting, ask your health care provider.  Take steps to manage and control your weight.  Get regular exercise. Aim for 150 minutes of moderate-intensity exercise (such as walking or yoga) or 75 minutes of vigorous exercise (such as running or swimming) each week.  Manage other health problems, such as diabetes  or high blood pressure. Ask your health care provider how you can manage these conditions. General instructions  Do not drive for 24 hours after your procedure if you were given a medicine to help you relax (sedative).  Take over-the-counter and prescription medicines only as told by your health care provider.  Avoid putting pressure on the area where the pacemaker was placed.  If you need an MRI after your pacemaker has been placed, be sure to tell the health care provider who orders the MRI that you have a pacemaker.  Avoid close and prolonged exposure to  electrical devices that have strong magnetic fields. These include: ? Cell phones. Avoid keeping them in a pocket near the pacemaker, and try using the ear opposite the pacemaker. ? MP3 players. ? Household appliances, like microwaves. ? Metal detectors. ? Electric generators. ? High-tension wires.  Keep all follow-up visits as directed by your health care provider. This is important. Contact a health care provider if:  You have pain at the incision site that is not relieved by over-the-counter or prescription medicines.  You have any of these around your incision site or coming from it: ? More redness, swelling, or pain. ? Fluid or blood. ? Warmth to the touch. ? Pus or a bad smell.  You have a fever.  You feel brief, occasional palpitations, light-headedness, or any symptoms that you think might be related to your heart. Get help right away if:  You experience chest pain that is different from the pain at the pacemaker site.  You develop a red streak that extends above or below the incision site.  You experience shortness of breath.  You have palpitations or an irregular heartbeat.  You have light-headedness that does not go away quickly.  You faint or have dizzy spells.  Your pulse suddenly drops or increases rapidly and does not return to normal.  You begin to gain weight and your legs and ankles swell. Summary  After your procedure, it is common to have pain, soreness, and some swelling where the pacemaker was inserted.  Make sure to keep your incision clean and dry. Follow instructions from your health care provider about how to take care of your incision.  Check your incision every day for signs of infection, such as more pain or swelling, pus or a bad smell, warmth, or leaking fluid and blood.  Avoid strenuous exercise and lifting your left arm higher than your shoulder for 2 weeks, or as long as told by your health care provider. This information is not  intended to replace advice given to you by your health care provider. Make sure you discuss any questions you have with your health care provider. Document Released: 10/15/2012 Document Revised: 11/17/2015 Document Reviewed: 11/17/2015 Elsevier Interactive Patient Education  2017 [employer].

## 2016-08-30 NOTE — H&P (Signed)
PCP:  Laverna Peace, NP           Cardiologist:  Dr Martinique Primary Electrophysiologist: Thompson Grayer, MD          Chief Complaint  Patient presents with  . Atrial Fibrillation     History of Present Illness: Nathaniel Bolton is a 76 y.o. male who presents today for ICD generator change.   He is doing very well since recent barostim device implant. Energy has significantly improved.  Today, he denies symptoms of palpitations, claudication, dizziness, presyncope, syncope, bleeding, or neurologic sequela. The patient is tolerating medications without difficulties and is otherwise without complaint today.        Past Medical History:  Diagnosis Date  . CAD (coronary artery disease)   . CKD stage 3 due to type 2 diabetes mellitus ([place])   . Diabetes mellitus    Type 2  . Erectile dysfunction   . Gout   . Hyperlipidemia   . Hypertension   . ICD (implantable cardiac defibrillator) in place   . Myocardial infarction, old    x2 in [place]. S/P CABG 2003, Dr Roxy Manns  . Obstructive lung disease (HCC)    Moderate per prior PFT's  . Paroxysmal atrial fibrillation ([place]) 3/17   discovered on event monitor  . Stroke [facility])    2013  . Systolic CHF, acute on chronic (HCC)    EF is 27% per myoview and echo October 2012  . Ventricular tachycardia ([place]) 08/2014   175 bpm        Past Surgical History:  Procedure Laterality Date  . BYPASS GRAFT     x5  . CARDIAC DEFIBRILLATOR PLACEMENT  2008   Medronic by Dr Elonda Husky at [facility]  . cataract surgery    . CHOLECYSTECTOMY    . COLONOSCOPY    . KNEE ARTHROSCOPY     right  . UMBILICAL HERNIA REPAIR             Current Outpatient Prescriptions  Medication Sig Dispense Refill  . allopurinol (ZYLOPRIM) 100 MG tablet Take 100 mg by mouth daily.     Marland Kitchen amiodarone (PACERONE) 200 MG tablet Take 1 tablet (200 mg total) by mouth daily. 90 tablet 3  . apixaban (ELIQUIS) 5 MG TABS tablet Take 1  tablet (5 mg total) by mouth 2 (two) times daily. 60 tablet 0  . Ascorbic Acid (VITAMIN C) 1000 MG tablet Take 1,000 mg by mouth daily.    Marland Kitchen atorvastatin (LIPITOR) 40 MG tablet Take 40 mg by mouth daily at 6 PM.     . carvedilol (COREG) 6.25 MG tablet Take 1 tablet by mouth 2 (two) times daily with a meal.     . Cholecalciferol (VITAMIN D3) 2000 units TABS Take 1 tablet by mouth 2 (two) times daily.    . furosemide (LASIX) 40 MG tablet Take 1 tablet (40 mg total) by mouth 2 (two) times daily. 180 tablet 3  . glipiZIDE (GLUCOTROL) 5 MG tablet Take 2.5 mg by mouth daily before breakfast.     . insulin aspart protamine- aspart (NOVOLOG MIX 70/30) (70-30) 100 UNIT/ML injection Inject 5-10 Units into the skin 3 (three) times daily as needed (IF blood sugar is <150).     Marland Kitchen losartan (COZAAR) 50 MG tablet Take 1 tablet (50 mg total) by mouth daily. 30 tablet 6  . NITROSTAT 0.4 MG SL tablet Place 0.4 mg under the tongue every 5 (five) minutes as needed for chest pain (MAX  3 TABLETS).     . NON FORMULARY Needles, syringes with needles, glucose reagent test strips    . Omega-3 Fatty Acids (FISH OIL PO) Take 2 tablets by mouth 2 (two) times daily.     Marland Kitchen omeprazole (PRILOSEC) 40 MG capsule Take 40 mg by mouth daily.     . Saw Palmetto, Serenoa repens, (SAW PALMETTO PO) Take 1 tablet by mouth daily.     . sertraline (ZOLOFT) 50 MG tablet Take 50 mg by mouth daily.     . vitamin E 400 UNIT capsule Take 1 capsule by mouth daily.      No current facility-administered medications for this visit.     Allergies:   Adhesive [tape]   Social History:  The patient  reports that he quit smoking about 27 years ago. He has never used smokeless tobacco. He reports that he does not drink alcohol or use drugs.   Family History:  The patient's family history includes Heart attack in his brother and father; Heart disease in his sister; Heart failure in his mother.    ROS:  Please see the  history of present illness.   All other systems are reviewed and negative.    PHYSICAL EXAM: Vitals:   08/30/16 1245  BP: (!) 161/74  Pulse: (!) 53  Temp: 97.8 F (36.6 C)  SpO2: 100%   GEN: Well nourished, well developed, in no acute distress  HEENT: normal  Neck: no JVD, carotid bruits, or masses Cardiac: bradycardic regular rhythm,  no murmurs, rubs, or gallops,no edema  Respiratory:  clear to auscultation bilaterally, normal work of breathing GI: soft, nontender, nondistended, + BS MS: no deformity or atrophy  Skin: warm and dry, device pocket is well healed,  barostim device pocket is well healed.  There was a very small retained suture along the lateral border which I removed uneventfully today. Neuro:  Strength and sensation are intact Psych: euthymic mood, full affect  EKG:  EKG is ordered today. The ekg ordered today shows sinus bradycardia 55 bpm, IVCD (QRS 116 msec)   Device interrogation is personally reviewed today in detail.  See PaceArt for details.    ASSESSMENT AND PLAN:  1.  Ischemic CM/ chronic systolic dysfunction ICD is at [facility] Risks, benefits, and alternatives to ICD pulse generator replacement were discussed in detail today.  The patient understands that risks include but are not limited to bleeding, infection, pneumothorax, perforation, tamponade, vascular damage, renal failure, MI, stroke, death, inappropriate shocks, damage to his existing leads, and lead dislodgement and wishes to proceed.   He is asymptomatic with sinus bradycardia.  No indication for an atrial lead today. QRS < 130 msec.  2. CRI Stable No change required today  3. HTN Stable No change required today  4.  VT No recent episodes  5. Afib Asymptomatic  Well controlled On eliquis (held since Monday)   ICD Criteria  Current LVEF:25%. Within 12 months prior to implant: Yes   Heart failure history: Yes, Class II  Cardiomyopathy history: Yes, Ischemic  Cardiomyopathy - Prior MI.  Atrial Fibrillation/Atrial Flutter: Yes, Paroxysmal.  Ventricular tachycardia history: Yes, Hemodynamic instability present. VT Type: Sustained Ventricular Tachycardia - Monomorphic.  Cardiac arrest history: No.  History of syndromes with risk of sudden death: No.  Previous ICD: Yes, Reason for ICD:  Primary prevention.  Current ICD indication: Secondary  PPM indication: No.   Class I or II Bradycardia indication present: No  Beta Blocker therapy for 3 or more months:  Yes, prescribed.   Ace Inhibitor/ARB therapy for 3 or more months: Yes, prescribed.

## 2016-08-31 ENCOUNTER — Encounter (HOSPITAL_COMMUNITY): Payer: Self-pay | Admitting: Internal Medicine

## 2016-09-05 DIAGNOSIS — L97521 Non-pressure chronic ulcer of other part of left foot limited to breakdown of skin: Secondary | ICD-10-CM | POA: Diagnosis not present

## 2016-09-05 DIAGNOSIS — E1142 Type 2 diabetes mellitus with diabetic polyneuropathy: Secondary | ICD-10-CM | POA: Diagnosis not present

## 2016-09-05 DIAGNOSIS — L03116 Cellulitis of left lower limb: Secondary | ICD-10-CM | POA: Diagnosis not present

## 2016-09-13 ENCOUNTER — Ambulatory Visit (INDEPENDENT_AMBULATORY_CARE_PROVIDER_SITE_OTHER): Payer: Medicare HMO | Admitting: *Deleted

## 2016-09-13 DIAGNOSIS — I255 Ischemic cardiomyopathy: Secondary | ICD-10-CM | POA: Diagnosis not present

## 2016-09-13 LAB — CUP PACEART INCLINIC DEVICE CHECK
Battery Remaining Longevity: 136 mo
Battery Voltage: 3.13 V
HighPow Impedance: 35 Ohm
HighPow Impedance: 54 Ohm
Implantable Lead Location: 753860
Implantable Lead Model: 6947
Implantable Pulse Generator Implant Date: 20180823
Lead Channel Impedance Value: 247 Ohm
Lead Channel Setting Pacing Amplitude: 2.75 V
Lead Channel Setting Pacing Pulse Width: 0.4 ms
Lead Channel Setting Sensing Sensitivity: 0.3 mV
MDC IDC LEAD IMPLANT DT: 20091217
MDC IDC MSMT LEADCHNL RV IMPEDANCE VALUE: 342 Ohm
MDC IDC MSMT LEADCHNL RV PACING THRESHOLD AMPLITUDE: 1.25 V
MDC IDC MSMT LEADCHNL RV PACING THRESHOLD PULSEWIDTH: 0.4 ms
MDC IDC MSMT LEADCHNL RV SENSING INTR AMPL: 7.25 mV
MDC IDC SESS DTM: 20180906114345
MDC IDC STAT BRADY RV PERCENT PACED: 1.25 %

## 2016-09-13 NOTE — Progress Notes (Signed)
Wound check appointment s/p gen change. Steri-strips removed. Wound without redness or edema. Incision edges approximated, wound well healed. Normal device function. Threshold, sensing, and impedance consistent with implant measurements. Device programmed at chronic output s/p gen change. Histogram distribution appropriate for patient and level of activity. No ventricular arrhythmias noted. Patient educated about wound care, arm mobility, lifting restrictions, shock plan. ROV with JA 12/03/16

## 2016-09-19 DIAGNOSIS — E1142 Type 2 diabetes mellitus with diabetic polyneuropathy: Secondary | ICD-10-CM | POA: Diagnosis not present

## 2016-09-19 DIAGNOSIS — L97521 Non-pressure chronic ulcer of other part of left foot limited to breakdown of skin: Secondary | ICD-10-CM | POA: Diagnosis not present

## 2016-09-19 DIAGNOSIS — L03116 Cellulitis of left lower limb: Secondary | ICD-10-CM | POA: Diagnosis not present

## 2016-09-27 ENCOUNTER — Ambulatory Visit (INDEPENDENT_AMBULATORY_CARE_PROVIDER_SITE_OTHER): Payer: Medicare HMO

## 2016-09-27 DIAGNOSIS — Z95810 Presence of automatic (implantable) cardiac defibrillator: Secondary | ICD-10-CM

## 2016-09-27 DIAGNOSIS — I5022 Chronic systolic (congestive) heart failure: Secondary | ICD-10-CM | POA: Diagnosis not present

## 2016-09-28 NOTE — Progress Notes (Signed)
EPIC Encounter for ICM Monitoring  Patient Name: Nathaniel Bolton is a 76 y.o. male Date: 09/28/2016 Primary Care Physican: [name], NP Primary [place] Electrophysiologist: Allred Nephrolog[facility] Weight:~185 lbs     Spoke with wife.  Heart Failure questions reviewed, pt asymptomatic.  Battery replacement was 08/30/2016   Thoracic impedance normal.  Prescribed dosage:  Furosemide 40 mg 1 tablet twice a day  Labs: 08/30/2016 Creatinine 1.51, BUN 29, Potassium 3.6, Sodium 139, EGFR 44-51 05/15/2016 Creatinine 1.59, BUN 24, Potassium 4.2, Sodium 141, EGFR 43 03/17/2016 Creatinine 1.41, BUN 24, Potassium 3.5, Sodium 136, EGFR 48-55 03/14/2016 Creatinine 1.54, BUN 35, Potassium 4.2, Sodium 140, EGFR 43-50  01/03/2016 Creatinine 1.52, BUN 23, Potassium 4.5, Sodium 144, EGFR 44-51 12/19/2015 Creatinine 1.36, BUN 24, Potassium 4.7, Sodium 138, EGFR 51-59  09/15/2015 Creatinine 1.28, BUN 24, Potassium 4.2, Sodium 139  03/02/2015 Creatinine 1.58, BUN 34, Potassium 4.5, Sodium 135   Recommendations: No changes.    Encouraged to call for fluid symptoms.  Follow-up plan: ICM clinic phone appointment on 10/29/2016.  Office appointment scheduled 10/15/2016 with Dr. Martinique and 12/03/2016 with Dr Rayann Heman.  Copy of ICM check sent to Dr. Rayann Heman.   3 month ICM trend: 09/28/2016   1 Year ICM trend:      [name], RN 09/28/2016 10:54 AM

## 2016-10-04 ENCOUNTER — Ambulatory Visit: Payer: Medicare HMO | Admitting: Cardiology

## 2016-10-10 NOTE — Progress Notes (Signed)
[name] Date of Birth: [birth year] Medical Record #[id number]  History of Present Illness: [name] is seen back today for followup of congestive heart failure.  He has a history of congestive heart failure with ejection fraction of 27%. He also has a history of coronary disease with remote coronary bypass surgery. He has a history of ventricular tachycardia and is s/p ICD implant.  In August 2016 he had a sustained episode of VT at 175 bpm. This did not exceed his rate limit and this was later adjusted. No recurrence. He also has a history of DM, CKD III, HTN, HLD, ED, OLD, atrial fib (CHA2DS2-VASc=7, DM, HTN, CAD, CHF, age x 1, CVA x 2) on Eliquis, CABG, VT, MDT ICD, ICM w/ EF 27% by MV 2012 (25-30% by echo 2012). Most recent Echo here in February shows EF 30%. Echo at the [facility] in April 2018 showed EF 25-30% with inferolateral and anterolateral akinesis and apical dyskinesis. Mild to moderate MR with tethering of the posterior leaflet.   He was admitted July 2017 with syncope and VT at [facility]. Was loaded with amiodarone. Cardiac cath done that showed EF 20%. Patent LIMA to LAD, patent SVG to RCA and occluded SVG to OMs. This was discussed with Dr. Roxy Manns as to whether he would be a redo surgery candidate and review of his initial op note in 2002 he felt that this was not an option. I personally reviewed his cath films. He has a patent SVG to PDA and LIMA to a diagonal branch. The LCx has a 90% ostial stenosis. The mid LCx and the OMs are occluded. No good target for PCI.  He has persistent evidence of fluid overload by Optivol. Diuretic therapy has been variable.  We attempted to start him on Entresto but he was unable to afford.  His nephrologist stopped lisinopril in July 2017. We later added losartan. He was  enrolled in the Barostim trial and had this placed in March 2018. In August 2018 he had ICD generator change out for ERI.   On follow up today he states he still feels  much better  since Barostim placed. He has been gaining weight which he attributes to eating too much.  Denies any chest pain. No gout. No increased edema. Hasn't been walking as much but has been mowing.  Thoracic impedence is stable.   Current Outpatient Prescriptions on File Prior to Visit  Medication Sig Dispense Refill  . allopurinol (ZYLOPRIM) 100 MG tablet Take 100 mg by mouth daily.     Marland Kitchen amiodarone (PACERONE) 200 MG tablet Take 1 tablet (200 mg total) by mouth daily. 90 tablet 3  . apixaban (ELIQUIS) 5 MG TABS tablet Take 1 tablet (5 mg total) by mouth 2 (two) times daily. 14 tablet 0  . Ascorbic Acid (VITAMIN C) 1000 MG tablet Take 1,000 mg by mouth daily.    Marland Kitchen atorvastatin (LIPITOR) 20 MG tablet Take 20 mg by mouth daily.    . carvedilol (COREG) 6.25 MG tablet Take 6.25 mg by mouth 2 (two) times daily with a meal.     . Cholecalciferol (VITAMIN D3) 2000 units TABS Take 2,000 Units by mouth 2 (two) times daily.     . furosemide (LASIX) 40 MG tablet Take 1 tablet (40 mg total) by mouth 2 (two) times daily. 180 tablet 3  . glipiZIDE (GLUCOTROL) 5 MG tablet Take 5 mg by mouth daily.     . insulin aspart protamine- aspart (NOVOLOG  MIX 70/30) (70-30) 100 UNIT/ML injection Inject 5-10 Units into the skin 3 (three) times daily as needed (IF blood sugar is <150).     Marland Kitchen losartan (COZAAR) 50 MG tablet Take 1 tablet (50 mg total) by mouth daily. 90 tablet 1  . NITROSTAT 0.4 MG SL tablet Place 0.4 mg under the tongue every 5 (five) minutes as needed for chest pain (MAX 3 TABLETS).     . NON FORMULARY Needles, syringes with needles, glucose reagent test strips    . Omega-3 Fatty Acids (FISH OIL PO) Take 2 tablets by mouth 2 (two) times daily.     Marland Kitchen omeprazole (PRILOSEC) 40 MG capsule Take 40 mg by mouth daily.     . Saw Palmetto, Serenoa repens, (SAW PALMETTO PO) Take 1 tablet by mouth daily.     . sertraline (ZOLOFT) 50 MG tablet Take 50 mg by mouth daily.     . vitamin E 400 UNIT capsule Take 400  Units by mouth daily.      No current facility-administered medications on file prior to visit.     Allergies  Allergen Reactions  . Adhesive [Tape] Itching and Rash    Testosterone patch adhesive    Past Medical History:  Diagnosis Date  . CAD (coronary artery disease)   . CKD stage 3 due to type 2 diabetes mellitus ([place])   . Diabetes mellitus    Type 2  . Erectile dysfunction   . Gout   . Hyperlipidemia   . Hypertension   . ICD (implantable cardiac defibrillator) in place   . Myocardial infarction, old    x2 in [place]. S/P CABG 2003, Dr Roxy Manns  . Obstructive lung disease (HCC)    Moderate per prior PFT's  . Paroxysmal atrial fibrillation ([place]) 3/17   discovered on event monitor  . Stroke [facility])    2013  . Systolic CHF, acute on chronic (HCC)    EF is 27% per myoview and echo October 2012  . Ventricular tachycardia ([place]) 08/2014   175 bpm    Past Surgical History:  Procedure Laterality Date  . BYPASS GRAFT     x5  . CARDIAC DEFIBRILLATOR PLACEMENT  2008   Medronic by Dr Elonda Husky at [facility]  . cataract surgery    . CHOLECYSTECTOMY    . COLONOSCOPY    . ICD GENERATOR CHANGEOUT N/A 08/30/2016   Procedure: ICD Generator Changeout;  Surgeon: Thompson Grayer, MD;  Location: [place] [facility];  Service: Cardiovascular;  Laterality: N/A;  . KNEE ARTHROSCOPY     right  . UMBILICAL HERNIA REPAIR      History  Smoking Status  . Former Smoker  . Quit date: 1991  Smokeless Tobacco  . Never Used    History  Alcohol Use No    Family History  Problem Relation Age of Onset  . Heart failure Mother   . Heart attack Father   . Heart disease Sister        valve replaced  . Heart attack Brother        CABG, aortic grafting    Review of Systems: The review of systems is as noted in history of present illness. All other systems were reviewed and are negative.  Physical Exam: BP (!) 108/55   Pulse (!) 49   Ht 5\' 10"  (1.778 m)   Wt 188 lb 6.4 oz (85.5  kg)   BMI 27.03 kg/m  GENERAL:  Well appearing WM in NAD  HEENT:  PERRL, EOMI, sclera are clear. Oropharynx is clear. NECK:  No jugular venous distention, carotid upstroke brisk and symmetric, no bruits, no thyromegaly or adenopathy LUNGS:  Clear to auscultation bilaterally CHEST:  Unremarkable HEART:  RRR,  PMI not displaced or sustained,S1 and S2 within normal limits, no S3, no S4: no clicks, no rubs, gr 6-7/2 systolic murmur LSB.  ABD:  Soft, nontender. BS +, no masses or bruits. No hepatomegaly, no splenomegaly EXT:  2 + pulses throughout, chronically swollen right ankle (old fracture), trace left ankle edema. SKIN:  Warm and dry.  No rashes NEURO:  Alert and oriented x 3. Cranial nerves II through XII intact. PSYCH:  Cognitively intact     LABORATORY DATA:   Lab Results  Component Value Date   WBC 7.1 08/30/2016   HGB 11.7 (L) 08/30/2016   HCT 35.0 (L) 08/30/2016   PLT 164 08/30/2016   GLUCOSE 119 (H) 08/30/2016   ALT 20 03/14/2016   AST 25 03/14/2016   NA 139 08/30/2016   K 3.6 08/30/2016   CL 104 08/30/2016   CREATININE 1.51 (H) 08/30/2016   BUN 29 (H) 08/30/2016   CO2 26 08/30/2016   INR 1.08 03/14/2016   HGBA1C 6.3 (H) 03/14/2016   Labs dated 02/27/16: cholesterol 105, triglycerides 107, HDL 28, LDL 56, BUN 27, creatinine 1.38. Remainder of CMET normal.   Labs from [facility] nephrologist: dated 10/13/15: Hgb 12.7. BUN 26, creatinine 1.37, other chemistries normal.   Labs from [facility] 05/15/16: Hgb 12.7. A1c 7.2%. BUN 24, creatinine 1.59. TSH 1.69. Uric acid 6.5.   Echo 02/20/16: Study Conclusions  - Left ventricle: The cavity size was moderately dilated. Wall   thickness was normal. - Mitral valve: There was mild to moderate regurgitation. Valve   area by pressure half-time: 2.42 cm^2. Valve area by continuity   equation (using LVOT flow): 0.99 cm^2. - Left atrium: The atrium was severely dilated. - Right ventricle: Systolic function was moderately reduced. -  Right atrium: The atrium was moderately dilated.  Echo from [place] in April showed EF 25-30%. Using Definity there was evidence of an apical thrombus.   Assessment / Plan: 1. Chronic systolic CHF. EF 25- 30%. Clinically much improved with Barostim device. Currently class 2. Continue carvedilol and losartan. Will continue lasix  40 mg bid.   Stressed importance of sodium restriction and focus on weight loss.   I suspect he does have some diuretic resistance related to his CKD.   2.  Status post CVA.  3. Coronary disease with remote myocardial infarctions in 1980 and 1990. Status post CABG. Patient is without anginal symptoms. Myoview study in October of 2012 showed evidence of an old infarct without ischemia. Cath in 2017 at [facility] noted above. Occluded SVGs to OMs. Not a candidate for surgical revascularization or PCI. He is asymptomatic. Continue medical therapy.  4. Status post ICD implant. ICD check Mar 02, 1015 was satisfactory. History of VT in August 2016 without recurrence. S/p generator change out.  5. CKD stage 3. Stable.   6. DM  on insulin- reviewed recommendations for low Carb diet. Needs to increase aerobic activity.  7. Atrial fibrillation, paroxysmal. Diagnosed on EP follow up. Now on Eliquis. Continue amiodarone. Rate controlled.   8. HTN- controlled.  9. Apical thrombus.Chronic. On Eliquis.   Follow up in 4 months.   Peter Martinique MD, [facility] 10/15/2016

## 2016-10-15 ENCOUNTER — Ambulatory Visit (INDEPENDENT_AMBULATORY_CARE_PROVIDER_SITE_OTHER): Payer: Medicare HMO | Admitting: Cardiology

## 2016-10-15 ENCOUNTER — Other Ambulatory Visit: Payer: Self-pay

## 2016-10-15 ENCOUNTER — Encounter: Payer: Self-pay | Admitting: Cardiology

## 2016-10-15 VITALS — BP 108/55 | HR 49 | Ht 70.0 in | Wt 188.4 lb

## 2016-10-15 DIAGNOSIS — I472 Ventricular tachycardia, unspecified: Secondary | ICD-10-CM

## 2016-10-15 DIAGNOSIS — I480 Paroxysmal atrial fibrillation: Secondary | ICD-10-CM

## 2016-10-15 DIAGNOSIS — I255 Ischemic cardiomyopathy: Secondary | ICD-10-CM | POA: Diagnosis not present

## 2016-10-15 DIAGNOSIS — Z95810 Presence of automatic (implantable) cardiac defibrillator: Secondary | ICD-10-CM | POA: Diagnosis not present

## 2016-10-15 DIAGNOSIS — I2510 Atherosclerotic heart disease of native coronary artery without angina pectoris: Secondary | ICD-10-CM | POA: Diagnosis not present

## 2016-10-15 DIAGNOSIS — I5022 Chronic systolic (congestive) heart failure: Secondary | ICD-10-CM | POA: Diagnosis not present

## 2016-10-15 MED ORDER — CARVEDILOL 6.25 MG PO TABS: 6.2500 mg | ORAL_TABLET | Freq: Two times a day (BID) | ORAL | 6 refills | Status: DC

## 2016-10-15 MED ORDER — ATORVASTATIN CALCIUM 20 MG PO TABS: 20.0000 mg | ORAL_TABLET | Freq: Every day | ORAL | 6 refills | Status: DC

## 2016-10-15 MED ORDER — OMEPRAZOLE 40 MG PO CPDR: 40.0000 mg | DELAYED_RELEASE_CAPSULE | Freq: Every day | ORAL | 6 refills | Status: DC

## 2016-10-15 MED ORDER — LOSARTAN POTASSIUM 50 MG PO TABS: 50.0000 mg | ORAL_TABLET | Freq: Every day | ORAL | 6 refills | Status: DC

## 2016-10-15 MED ORDER — FUROSEMIDE 40 MG PO TABS: 40.0000 mg | ORAL_TABLET | Freq: Two times a day (BID) | ORAL | 6 refills | Status: DC

## 2016-10-15 MED ORDER — APIXABAN 5 MG PO TABS: 5.0000 mg | ORAL_TABLET | Freq: Two times a day (BID) | ORAL | 6 refills | Status: AC

## 2016-10-15 MED ORDER — AMIODARONE HCL 200 MG PO TABS: 200.0000 mg | ORAL_TABLET | Freq: Every day | ORAL | 6 refills | Status: DC

## 2016-10-15 NOTE — Patient Instructions (Addendum)
  You need to lose weight with less simple carbs like rice, potatoes, pasta, and bread.   You need to increase your aerobic activity with walking  Continue your current therapy  I will see you in 4 months.

## 2016-10-22 NOTE — Progress Notes (Signed)
Pt present today for Beat HF Research appointment week 6. Pt continues to do well other than a nonproductive cough diagnosed by PCP and treated with prednisone and cough syrup.  Pt continues to await phone call from Dr. Martinique regarding the "blood clot" noted on echo by the [facility] cardiologist.  Pt asymptomatic.  Both right neck and right chest incisions healed. No signs/symptoms of ACS. Renita Papa [facility] present for device programming. Barostim amplitude adjusted from 2.6 to 3.4. Pt did not experience transient electrical stimulation of non-vascular tissue. Ambulated in hallway with Study Coordinator without event. Will return in 2 weeks.

## 2016-10-29 ENCOUNTER — Ambulatory Visit (INDEPENDENT_AMBULATORY_CARE_PROVIDER_SITE_OTHER): Payer: Medicare HMO

## 2016-10-29 DIAGNOSIS — I5022 Chronic systolic (congestive) heart failure: Secondary | ICD-10-CM

## 2016-10-29 DIAGNOSIS — Z95810 Presence of automatic (implantable) cardiac defibrillator: Secondary | ICD-10-CM | POA: Diagnosis not present

## 2016-10-30 NOTE — Progress Notes (Signed)
EPIC Encounter for ICM Monitoring  Patient Name: Nathaniel Bolton is a 76 y.o. male Date: 10/30/2016 Primary Care Physican: [name], NP Primary [place] Electrophysiologist: Allred Nephrologist: [facility] Dry Weight:188 lbs         Spoke with wife.  Heart Failure questions reviewed, pt asymptomatic.   Thoracic impedance normal.  Prescribed dosage: Furosemide 40 mg 1 tablet twice a day  Labs: 08/30/2016 Creatinine 1.51, BUN 29, Potassium 3.6, Sodium 139, EGFR 44-51 05/15/2016 Creatinine 1.59, BUN 24, Potassium 4.2, Sodium 141, EGFR 43 03/17/2016 Creatinine 1.41, BUN 24, Potassium 3.5, Sodium 136, EGFR 48-55 03/14/2016 Creatinine 1.54, BUN 35, Potassium 4.2, Sodium 140, EGFR 43-50  01/03/2016 Creatinine 1.52, BUN 23, Potassium 4.5, Sodium 144, EGFR 44-51 12/19/2015 Creatinine 1.36, BUN 24, Potassium 4.7, Sodium 138, EGFR 51-59  09/15/2015 Creatinine 1.28, BUN 24, Potassium 4.2, Sodium 139  03/02/2015 Creatinine 1.58, BUN 34, Potassium 4.5, Sodium 135   Recommendations: No changes.   Encouraged to call for fluid symptoms.  Follow-up plan: ICM clinic phone appointment on 01/03/2017.  Office appointment scheduled 12/03/2016 with Dr. Rayann Heman.  Copy of ICM check sent to Dr. Rayann Heman.   3 month ICM trend: 10/29/2016   1 Year ICM trend:      [name], RN 10/30/2016 7:57 AM

## 2016-11-02 ENCOUNTER — Encounter: Payer: Medicare HMO | Admitting: Internal Medicine

## 2016-11-20 VITALS — HR 78

## 2016-11-20 DIAGNOSIS — Z006 Encounter for examination for normal comparison and control in clinical research program: Secondary | ICD-10-CM

## 2016-11-20 NOTE — Progress Notes (Signed)
Patient present for BeAT-HF Study Month 9 visit. States he is feeling great. Had recent ICD battery/generator change-out. States he followed up with Dr. Martinique and received a great cardiology report. He states he continues to improve since his Barostim implant. All aspects of visit completed without event. Patient will return for research visit in 3 months.

## 2016-11-22 DIAGNOSIS — I10 Essential (primary) hypertension: Secondary | ICD-10-CM | POA: Diagnosis not present

## 2016-11-22 DIAGNOSIS — I509 Heart failure, unspecified: Secondary | ICD-10-CM | POA: Diagnosis not present

## 2016-11-22 DIAGNOSIS — E1169 Type 2 diabetes mellitus with other specified complication: Secondary | ICD-10-CM | POA: Diagnosis not present

## 2016-11-22 DIAGNOSIS — N189 Chronic kidney disease, unspecified: Secondary | ICD-10-CM | POA: Diagnosis not present

## 2016-11-22 DIAGNOSIS — E782 Mixed hyperlipidemia: Secondary | ICD-10-CM | POA: Diagnosis not present

## 2016-12-03 ENCOUNTER — Encounter (INDEPENDENT_AMBULATORY_CARE_PROVIDER_SITE_OTHER): Payer: Self-pay

## 2016-12-03 ENCOUNTER — Encounter: Payer: Self-pay | Admitting: Internal Medicine

## 2016-12-03 ENCOUNTER — Ambulatory Visit (INDEPENDENT_AMBULATORY_CARE_PROVIDER_SITE_OTHER): Payer: Medicare HMO | Admitting: Internal Medicine

## 2016-12-03 VITALS — BP 112/62 | HR 51 | Ht 70.0 in | Wt 193.2 lb

## 2016-12-03 DIAGNOSIS — I255 Ischemic cardiomyopathy: Secondary | ICD-10-CM

## 2016-12-03 DIAGNOSIS — I5022 Chronic systolic (congestive) heart failure: Secondary | ICD-10-CM | POA: Diagnosis not present

## 2016-12-03 DIAGNOSIS — I472 Ventricular tachycardia, unspecified: Secondary | ICD-10-CM

## 2016-12-03 DIAGNOSIS — I10 Essential (primary) hypertension: Secondary | ICD-10-CM

## 2016-12-03 DIAGNOSIS — Z95810 Presence of automatic (implantable) cardiac defibrillator: Secondary | ICD-10-CM | POA: Diagnosis not present

## 2016-12-03 LAB — CUP PACEART INCLINIC DEVICE CHECK
Battery Voltage: 3.12 V
Brady Statistic RV Percent Paced: 1.2 %
HIGH POWER IMPEDANCE MEASURED VALUE: 38 Ohm
HighPow Impedance: 55 Ohm
Implantable Lead Implant Date: 20091217
Implantable Lead Model: 6947
Lead Channel Impedance Value: 285 Ohm
Lead Channel Impedance Value: 342 Ohm
Lead Channel Sensing Intrinsic Amplitude: 7.75 mV
Lead Channel Sensing Intrinsic Amplitude: 8 mV
Lead Channel Setting Pacing Pulse Width: 0.4 ms
Lead Channel Setting Sensing Sensitivity: 0.3 mV
MDC IDC LEAD LOCATION: 753860
MDC IDC MSMT BATTERY REMAINING LONGEVITY: 135 mo
MDC IDC MSMT LEADCHNL RV PACING THRESHOLD AMPLITUDE: 1 V
MDC IDC MSMT LEADCHNL RV PACING THRESHOLD PULSEWIDTH: 0.4 ms
MDC IDC PG IMPLANT DT: 20180823
MDC IDC SESS DTM: 20181126113306
MDC IDC SET LEADCHNL RV PACING AMPLITUDE: 2.5 V

## 2016-12-03 NOTE — Progress Notes (Signed)
PCP: [name], NP Primary Cardiologist:  Dr Martinique Primary EP: Dr Cherlyn Cushing is a 76 y.o. male who presents today for routine electrophysiology followup.  Since his ICD generator change in August, the patient reports doing very well.  Today, he denies symptoms of palpitations, chest pain, shortness of breath,  lower extremity edema, dizziness, presyncope, syncope, or ICD shocks.  The patient is otherwise without complaint today.   Past Medical History:  Diagnosis Date  . CAD (coronary artery disease)   . CKD stage 3 due to type 2 diabetes mellitus ([place])   . Diabetes mellitus    Type 2  . Erectile dysfunction   . Gout   . Hyperlipidemia   . Hypertension   . ICD (implantable cardiac defibrillator) in place   . Myocardial infarction, old    x2 in [place]. S/P CABG 2003, Dr Roxy Manns  . Obstructive lung disease (HCC)    Moderate per prior PFT's  . Paroxysmal atrial fibrillation ([place]) 3/17   discovered on event monitor  . Stroke [facility])    2013  . Systolic CHF, acute on chronic (HCC)    EF is 27% per myoview and echo October 2012  . Ventricular tachycardia ([place]) 08/2014   175 bpm   Past Surgical History:  Procedure Laterality Date  . BYPASS GRAFT     x5  . CARDIAC DEFIBRILLATOR PLACEMENT  2008   Medronic by Dr Elonda Husky at [facility]  . cataract surgery    . CHOLECYSTECTOMY    . COLONOSCOPY    . ICD GENERATOR CHANGEOUT N/A 08/30/2016   Procedure: ICD Generator Changeout;  Surgeon: Thompson Grayer, MD;  Location: [facility];  Service: Cardiovascular;  Laterality: N/A;  . KNEE ARTHROSCOPY     right  . UMBILICAL HERNIA REPAIR      ROS- all systems are reviewed and negative except as per HPI above  Current Outpatient Medications  Medication Sig Dispense Refill  . amiodarone (PACERONE) 200 MG tablet Take 1 tablet (200 mg total) by mouth daily. 30 tablet 6  . apixaban (ELIQUIS) 5 MG TABS tablet Take 1 tablet (5 mg total) by mouth 2 (two) times  daily. 60 tablet 6  . Ascorbic Acid (VITAMIN C) 1000 MG tablet Take 1,000 mg by mouth daily.    Marland Kitchen atorvastatin (LIPITOR) 20 MG tablet Take 1 tablet (20 mg total) by mouth daily. 30 tablet 6  . carvedilol (COREG) 6.25 MG tablet Take 1 tablet (6.25 mg total) by mouth 2 (two) times daily with a meal. 60 tablet 6  . Cholecalciferol (VITAMIN D3) 2000 units TABS Take 2,000 Units by mouth 2 (two) times daily.     . furosemide (LASIX) 40 MG tablet Take 1 tablet (40 mg total) by mouth 2 (two) times daily. 60 tablet 6  . glipiZIDE (GLUCOTROL) 5 MG tablet Take 5 mg by mouth daily.     . insulin aspart protamine- aspart (NOVOLOG MIX 70/30) (70-30) 100 UNIT/ML injection Inject 5-10 Units into the skin 3 (three) times daily as needed (IF blood sugar is <150).     Marland Kitchen losartan (COZAAR) 50 MG tablet Take 1 tablet (50 mg total) by mouth daily. 30 tablet 6  . NITROSTAT 0.4 MG SL tablet Place 0.4 mg under the tongue every 5 (five) minutes as needed for chest pain (MAX 3 TABLETS).     . NON FORMULARY Needles, syringes with needles, glucose reagent test strips    . Omega-3  Fatty Acids (FISH OIL PO) Take 2 tablets by mouth 2 (two) times daily.     Marland Kitchen omeprazole (PRILOSEC) 40 MG capsule Take 1 capsule (40 mg total) by mouth daily. 30 capsule 6  . Saw Palmetto, Serenoa repens, (SAW PALMETTO PO) Take 1 tablet by mouth daily.     . sertraline (ZOLOFT) 100 MG tablet Take 100 mg by mouth daily.    . vitamin E 400 UNIT capsule Take 400 Units by mouth daily.     Marland Kitchen allopurinol (ZYLOPRIM) 100 MG tablet Take 100 mg by mouth daily.      No current facility-administered medications for this visit.     Physical Exam: Vitals:   12/03/16 1112  BP: 112/62  Pulse: (!) 51  SpO2: 95%  Weight: 193 lb 3.2 oz (87.6 kg)  Height: 5\' 10"  (1.778 m)    GEN- The patient is well appearing, alert and oriented x 3 today.   Head- normocephalic, atraumatic Eyes-  Sclera clear, conjunctiva pink Ears- hearing intact Oropharynx- clear Lungs-  Clear to ausculation bilaterally, normal work of breathing Chest- ICD pocket is well healed Heart- Regular rate and rhythm, no murmurs, rubs or gallops, PMI not laterally displaced GI- soft, NT, ND, + BS Extremities- no clubbing, cyanosis, or edema  ICD interrogation- reviewed in detail today,  See PACEART report  ekg tracing ordered today is personally reviewed and shows sinus rhythm 51 bpm,  (QRS 130 msec)  Assessment and Plan:  1.  Chronic systolic dysfunction/ ischemic CM euvolemic today Doing well s/p ICD generator change Stable on an appropriate medical regimen Normal ICD function See Pace Art report No changes today Followed by Sharman Cheek  2. HTN Stable No change required today  3. VT Well controlled with amiodarone Check lfts, tfts today No change required today   4. CRI Stable bmet today No change required today  5. Paroxysmal atrial fibrlllation No afib since ICD implant Continue on eliquis Cbc and bmet today   [facility] Return to see EP NP in a year Follow-up with Dr Martinique as scheduled  Thompson Grayer MD, [facility] 12/03/2016 11:48 AM

## 2016-12-03 NOTE — Patient Instructions (Addendum)
Medication Instructions:  Your physician recommends that you continue on your current medications as directed. Please refer to the Current Medication list given to you today.   Labwork: None ordered    Testing/Procedures: None ordered   Follow-Up: Your physician wants you to follow-up in: 12 months with Chanetta Marshall, NP You will receive a reminder letter in the mail two months in advance. If you don't receive a letter, please call our office to schedule the follow-up appointment.  Remote monitoring is used to monitor your ICD from home. This monitoring reduces the number of office visits required to check your device to one time per year. It allows [place] to keep an eye on the functioning of your device to ensure it is working properly. You are scheduled for a device check from home on 03/04/17. You may send your transmission at any time that day. If you have a wireless device, the transmission will be sent automatically. After your physician reviews your transmission, you will receive a postcard with your next transmission date.     Any Other Special Instructions Will Be Listed Below (If Applicable).     If you need a refill on your cardiac medications before your next appointment, please call your pharmacy.

## 2016-12-05 NOTE — Progress Notes (Addendum)
Research Encounter  On Wednesday, November 13th, subject was updated on BeAT-HF enrollment and study results.  Official letter will be mailed to subject's home address.

## 2017-01-03 ENCOUNTER — Ambulatory Visit (INDEPENDENT_AMBULATORY_CARE_PROVIDER_SITE_OTHER): Payer: Medicare HMO

## 2017-01-03 DIAGNOSIS — I5022 Chronic systolic (congestive) heart failure: Secondary | ICD-10-CM

## 2017-01-03 DIAGNOSIS — Z95810 Presence of automatic (implantable) cardiac defibrillator: Secondary | ICD-10-CM | POA: Diagnosis not present

## 2017-01-04 ENCOUNTER — Telehealth: Payer: Self-pay

## 2017-01-04 NOTE — Telephone Encounter (Signed)
Remote ICM transmission received.  Attempted call to wife and no message

## 2017-01-04 NOTE — Progress Notes (Signed)
EPIC Encounter for ICM Monitoring  Patient Name: Nathaniel Bolton is a 76 y.o. male Date: 01/04/2017 Primary Care Physican: [name], NP Primary [place] Electrophysiologist: Allred Nephrologist: [facility] Dry Weight:Previous weight 188 lbs                                                     Attempted call to wife and unable to reach.   Transmission reviewed.    Thoracic impedance normal.  Prescribed dosage: Furosemide 40 mg 1 tablet twice a day  Labs: 08/30/2016 Creatinine 1.51, BUN 29, Potassium 3.6, Sodium 139, EGFR 44-51 05/15/2016 Creatinine 1.59, BUN 24, Potassium 4.2, Sodium 141, EGFR 43 03/17/2016 Creatinine 1.41, BUN 24, Potassium 3.5, Sodium 136, EGFR 48-55 03/14/2016 Creatinine 1.54, BUN 35, Potassium 4.2, Sodium 140, EGFR 43-50  01/03/2016 Creatinine 1.52, BUN 23, Potassium 4.5, Sodium 144, EGFR 44-51 12/19/2015 Creatinine 1.36, BUN 24, Potassium 4.7, Sodium 138, EGFR 51-59  09/15/2015 Creatinine 1.28, BUN 24, Potassium 4.2, Sodium 139  03/02/2015 Creatinine 1.58, BUN 34, Potassium 4.5, Sodium 135   Recommendations: NONE - Unable to reach.  Follow-up plan: ICM clinic phone appointment on 02/04/2017.    Copy of ICM check sent to Dr. Rayann Heman.   3 month ICM trend: 01/03/2017    1 Year ICM trend:       [name], RN 01/04/2017 12:10 PM

## 2017-01-23 DIAGNOSIS — Z136 Encounter for screening for cardiovascular disorders: Secondary | ICD-10-CM | POA: Diagnosis not present

## 2017-01-23 DIAGNOSIS — Z9181 History of falling: Secondary | ICD-10-CM | POA: Diagnosis not present

## 2017-01-23 DIAGNOSIS — Z1331 Encounter for screening for depression: Secondary | ICD-10-CM | POA: Diagnosis not present

## 2017-01-23 DIAGNOSIS — Z0000 Encounter for general adult medical examination without abnormal findings: Secondary | ICD-10-CM | POA: Diagnosis not present

## 2017-01-23 DIAGNOSIS — Z125 Encounter for screening for malignant neoplasm of prostate: Secondary | ICD-10-CM | POA: Diagnosis not present

## 2017-01-23 DIAGNOSIS — Z1211 Encounter for screening for malignant neoplasm of colon: Secondary | ICD-10-CM | POA: Diagnosis not present

## 2017-01-23 DIAGNOSIS — E785 Hyperlipidemia, unspecified: Secondary | ICD-10-CM | POA: Diagnosis not present

## 2017-02-04 ENCOUNTER — Ambulatory Visit (INDEPENDENT_AMBULATORY_CARE_PROVIDER_SITE_OTHER): Payer: Medicare HMO

## 2017-02-04 DIAGNOSIS — J069 Acute upper respiratory infection, unspecified: Secondary | ICD-10-CM | POA: Diagnosis not present

## 2017-02-04 DIAGNOSIS — Z95810 Presence of automatic (implantable) cardiac defibrillator: Secondary | ICD-10-CM | POA: Diagnosis not present

## 2017-02-04 DIAGNOSIS — J208 Acute bronchitis due to other specified organisms: Secondary | ICD-10-CM | POA: Diagnosis not present

## 2017-02-04 DIAGNOSIS — I5022 Chronic systolic (congestive) heart failure: Secondary | ICD-10-CM | POA: Diagnosis not present

## 2017-02-04 DIAGNOSIS — I10 Essential (primary) hypertension: Secondary | ICD-10-CM | POA: Diagnosis not present

## 2017-02-04 NOTE — Progress Notes (Signed)
EPIC Encounter for ICM Monitoring  Patient Name: Nathaniel Bolton is a 77 y.o. male Date: 02/04/2017 Primary Care Physican: [name], NP Primary [place] Electrophysiologist: Allred Nephrolog[facility] Dry Weight:unknown      Spoke with wife. Heart Failure questions reviewed, pt has a cold which started about 5 days ago.  She also has a cold.   Patient has been taking extra Furosemide when he has leg swelling.  Advised to follow any instructions that Dr Martinique has given him about if he should take extra Furosemide   Thoracic impedance abnormal suggesting fluid accumulation since 01/25/2017.  Prescribed dosage: Furosemide 40 mg 1 tablet twice a day  Labs: 08/30/2016 Creatinine 1.51, BUN 29, Potassium 3.6, Sodium 139, EGFR 44-51 05/15/2016 Creatinine 1.59, BUN 24, Potassium 4.2, Sodium 141, EGFR 43 03/17/2016 Creatinine 1.41, BUN 24, Potassium 3.5, Sodium 136, EGFR 48-55 03/14/2016 Creatinine 1.54, BUN 35, Potassium 4.2, Sodium 140, EGFR 43-50  01/03/2016 Creatinine 1.52, BUN 23, Potassium 4.5, Sodium 144, EGFR 44-51 12/19/2015 Creatinine 1.36, BUN 24, Potassium 4.7, Sodium 138, EGFR 51-59  09/15/2015 Creatinine 1.28, BUN 24, Potassium 4.2, Sodium 139  03/02/2015 Creatinine 1.58, BUN 34, Potassium 4.5, Sodium 135  Recommendations: No changes.    Follow-up plan: ICM clinic phone appointment on 02/12/2017 to recheck fluid levels.    Copy of ICM check sent to Dr. Martinique and Dr. Rayann Heman.   3 month ICM trend: 02/04/2017    1 Year ICM trend:       [name], RN 02/04/2017 9:56 AM

## 2017-02-12 ENCOUNTER — Ambulatory Visit (INDEPENDENT_AMBULATORY_CARE_PROVIDER_SITE_OTHER): Payer: Self-pay

## 2017-02-12 DIAGNOSIS — Z95810 Presence of automatic (implantable) cardiac defibrillator: Secondary | ICD-10-CM

## 2017-02-12 DIAGNOSIS — I5022 Chronic systolic (congestive) heart failure: Secondary | ICD-10-CM

## 2017-02-12 NOTE — Progress Notes (Signed)
EPIC Encounter for ICM Monitoring  Patient Name: Nathaniel Bolton is a 77 y.o. male Date: 02/12/2017 Primary Care Physican: [name], NP  Primary [place] Electrophysiologist: Allred Nephrologist: [facility] Dry Weight:unknown           Attempted call with wife.  Left detailed message regarding transmission.  Transmission reviewed.    Thoracic impedance returned to normal after patient self adjusted an increase in Furosemide for 2-3 days.  Prescribed dosage: Furosemide 40 mg 1 tablet twice a day  Labs: 08/30/2016 Creatinine 1.51, BUN 29, Potassium 3.6, Sodium 139, EGFR 44-51 05/15/2016 Creatinine 1.59, BUN 24, Potassium 4.2, Sodium 141, EGFR 43 03/17/2016 Creatinine 1.41, BUN 24, Potassium 3.5, Sodium 136, EGFR 48-55 03/14/2016 Creatinine 1.54, BUN 35, Potassium 4.2, Sodium 140, EGFR 43-50   Recommendations: Left voice mail with ICM number and encouraged to call if experiencing any fluid symptoms.  Follow-up plan: ICM clinic phone appointment on 03/07/2017.    Copy of ICM check sent to Dr. Rayann Heman.   3 month ICM trend: 02/12/2017    1 Year ICM trend:       [name], RN 02/12/2017 11:24 AM

## 2017-03-07 NOTE — Progress Notes (Signed)
No ICM remote transmission received for 03/07/2017 and next ICM transmission scheduled for 03/19/2017.

## 2017-03-19 ENCOUNTER — Ambulatory Visit (INDEPENDENT_AMBULATORY_CARE_PROVIDER_SITE_OTHER): Payer: Medicare HMO

## 2017-03-19 DIAGNOSIS — I5022 Chronic systolic (congestive) heart failure: Secondary | ICD-10-CM | POA: Diagnosis not present

## 2017-03-19 DIAGNOSIS — Z95810 Presence of automatic (implantable) cardiac defibrillator: Secondary | ICD-10-CM | POA: Diagnosis not present

## 2017-03-19 NOTE — Progress Notes (Signed)
EPIC Encounter for ICM Monitoring  Patient Name: Nathaniel Bolton is a 77 y.o. male Date: 03/19/2017 Primary Care Physican: [name], NP Primary [place] Electrophysiologist: Allred Nephrolog[facility]: [facility] Dry Weight:unknown       Attempted call to wife and unable to reach.  Left detailed message regarding transmission.  Transmission reviewed.    Thoracic impedance normal.  Prescribed dosage: Furosemide 40 mg 1 tablet twice a day  Labs: 08/30/2016 Creatinine 1.51, BUN 29, Potassium 3.6, Sodium 139, EGFR 44-51 05/15/2016 Creatinine 1.59, BUN 24, Potassium 4.2, Sodium 141, EGFR 43 03/17/2016 Creatinine 1.41, BUN 24, Potassium 3.5, Sodium 136, EGFR 48-55 03/14/2016 Creatinine 1.54, BUN 35, Potassium 4.2, Sodium 140, EGFR 43-50   Recommendations:  Left voice mail with ICM number and encouraged to call if experiencing any fluid symptoms.  Follow-up plan: ICM clinic phone appointment on 04/19/2017.  Office appointment scheduled 04/02/2017 with Almyra Deforest, PA.  Copy of ICM check sent to Dr. Rayann Heman.   3 month ICM trend: 03/19/2017    1 Year ICM trend:       [name], RN 03/19/2017 11:58 AM

## 2017-04-02 ENCOUNTER — Encounter: Payer: Self-pay | Admitting: Physician Assistant

## 2017-04-02 ENCOUNTER — Ambulatory Visit: Payer: Medicare HMO | Admitting: Physician Assistant

## 2017-04-02 ENCOUNTER — Encounter: Payer: Medicare HMO | Admitting: *Deleted

## 2017-04-02 VITALS — BP 139/64 | HR 54 | Ht 70.0 in | Wt 191.2 lb

## 2017-04-02 VITALS — BP 152/55 | HR 54 | Wt 190.6 lb

## 2017-04-02 DIAGNOSIS — I25810 Atherosclerosis of coronary artery bypass graft(s) without angina pectoris: Secondary | ICD-10-CM | POA: Diagnosis not present

## 2017-04-02 DIAGNOSIS — E785 Hyperlipidemia, unspecified: Secondary | ICD-10-CM

## 2017-04-02 DIAGNOSIS — I10 Essential (primary) hypertension: Secondary | ICD-10-CM

## 2017-04-02 DIAGNOSIS — Z006 Encounter for examination for normal comparison and control in clinical research program: Secondary | ICD-10-CM

## 2017-04-02 DIAGNOSIS — I5022 Chronic systolic (congestive) heart failure: Secondary | ICD-10-CM | POA: Diagnosis not present

## 2017-04-02 DIAGNOSIS — N183 Chronic kidney disease, stage 3 unspecified: Secondary | ICD-10-CM

## 2017-04-02 DIAGNOSIS — Z794 Long term (current) use of insulin: Secondary | ICD-10-CM | POA: Diagnosis not present

## 2017-04-02 DIAGNOSIS — Z8673 Personal history of transient ischemic attack (TIA), and cerebral infarction without residual deficits: Secondary | ICD-10-CM | POA: Diagnosis not present

## 2017-04-02 DIAGNOSIS — I480 Paroxysmal atrial fibrillation: Secondary | ICD-10-CM | POA: Diagnosis not present

## 2017-04-02 DIAGNOSIS — E119 Type 2 diabetes mellitus without complications: Secondary | ICD-10-CM

## 2017-04-02 NOTE — Progress Notes (Signed)
RESEARCH ENCOUNTER  Patient ID: [name]  DOB: Sep 04, 1940  Nathaniel Bolton presented to the [facility] for the 12 month visit of the BeAT-HF Research Study.  Subject states that he is active daily, walking and playing with grandchildren.  Verbalizes diet and medication compliance.  All 12 month study procedures completed without event.    Subject having issue with sciatica pain, with pain radiating down left leg.  He has been seeing a physician at the [facility] for treatment.  Currently, he is taking 600mg  of Gabapentin and Tylenol for pain management.  The subject's wife brought in the Gabapentin prescription bottle and stated that they used one bottle before and had the current bottle filled on Feb 14th.      Patient will follow up with [facility] in June.

## 2017-04-02 NOTE — Progress Notes (Signed)
Cardiology Office Note    Date:  04/04/2017   ID:  Nathaniel Bolton, DOB 1940-11-20, MRN [id number]  PCP:  [name], NP  Cardiologist:  Dr. Martinique Electrophysiologist: Dr. Rayann Heman  Chief Complaint  Patient presents with  . Follow-up    seen for Dr. Martinique    History of Present Illness:  Nathaniel Bolton is a 77 y.o. male with PMH of CKD stage III, CAD s/p CABG, DM II, HTN, HLD, h/o VT s/p ICD, chronic systolic heart failure, COPD, PAF on eliquis and CVA.  He had an episode of sustained VT with heart rate 175 bpm in August 2016, this does not exceed his weight limit for the device, his device was later adjusted.  He was admitted in July 2017 with syncope and VT at [facility].  He was loaded with amiodarone.  A cardiac catheterization done at that time showed EF 20%, patent LIMA to LAD, patent SVG to RCA, occluded SVG to OM.  This was discussed with Dr.  Roxy Manns to see if patient need redo CABG, however he was found not to be a good candidate for redo surgery.  His cath film was reviewed by Dr. Martinique, left circumflex artery had a 90% ostial stenosis, mid left circumflex on the OM spur occluded, however no good target for PCI.  Echo at [facility] hospital in April 2018 showed EF 25-30% with inferolateral and anterior lateral akinesis and apical dyskinesis, mild to moderate MR with tethering of the posterior leaflet.  There has been some attempt at starting him on Entresto in the past, however he was unable to afford it.  His nephrologist stopped lisinopril in July 2017.  Losartan was later added.  He had a generator change out for the ICD in August 2018.  He was enrolled in Barostim trial which was placed in March 2018.  He presents today for cardiology office visit.  He continues to exercise without significant exertional chest pain or shortness of breath.  He has no lower extremity edema, orthopnea or PND.  He is getting his medications from the [place], he did ask for some samples of Eliquis as [facility]  [facility] Eli[facility].    Past Medical History:  Diagnosis Date  . CAD (coronary artery disease)   . CKD stage 3 due to type 2 diabetes mellitus ([place])   . Diabetes mellitus    Type 2  . Erectile dysfunction   . Gout   . Hyperlipidemia   . Hypertension   . ICD (implantable cardiac defibrillator) in place   . Myocardial infarction, old    x2 in [place]. S/P CABG 2003, Dr Roxy Manns  . Obstructive lung disease (HCC)    Moderate per prior PFT's  . Paroxysmal atrial fibrillation ([place]) 3/17   discovered on event monitor  . Stroke [facility])    2013  . Systolic CHF, acute on chronic (HCC)    EF is 27% per myoview and echo October 2012  . Ventricular tachycardia ([place]) 08/2014   175 bpm    Past Surgical History:  Procedure Laterality Date  . BYPASS GRAFT     x5  . CARDIAC DEFIBRILLATOR PLACEMENT  2008   Medronic by Dr Elonda Husky at [facility]  . cataract surgery    . CHOLECYSTECTOMY    . COLONOSCOPY    . ICD GENERATOR CHANGEOUT N/A 08/30/2016   MDT Visia AF VR ICD implanted by Dr Rayann Heman  .  KNEE ARTHROSCOPY     right  . UMBILICAL HERNIA REPAIR      Current Medications: Outpatient Medications Prior to Visit  Medication Sig Dispense Refill  . allopurinol (ZYLOPRIM) 100 MG tablet Take 100 mg by mouth daily.    Marland Kitchen amiodarone (PACERONE) 200 MG tablet Take 1 tablet (200 mg total) by mouth daily. 30 tablet 6  . apixaban (ELIQUIS) 5 MG TABS tablet Take 1 tablet (5 mg total) by mouth 2 (two) times daily. 60 tablet 6  . Ascorbic Acid (VITAMIN C) 1000 MG tablet Take 1,000 mg by mouth daily.    Marland Kitchen atorvastatin (LIPITOR) 20 MG tablet Take 1 tablet (20 mg total) by mouth daily. 30 tablet 6  . carvedilol (COREG) 6.25 MG tablet Take 1 tablet (6.25 mg total) by mouth 2 (two) times daily with a meal. 60 tablet 6  . Cholecalciferol (VITAMIN D3) 2000 units TABS Take 2,000 Units by mouth 2 (two) times daily.     . furosemide (LASIX) 40 MG tablet Take 1  tablet (40 mg total) by mouth 2 (two) times daily. 60 tablet 6  . glipiZIDE (GLUCOTROL) 5 MG tablet Take 5 mg by mouth daily.     . insulin aspart protamine- aspart (NOVOLOG MIX 70/30) (70-30) 100 UNIT/ML injection Inject 5-10 Units into the skin 3 (three) times daily as needed (IF blood sugar is <150).     Marland Kitchen losartan (COZAAR) 50 MG tablet Take 50 mg by mouth daily.    Marland Kitchen NITROSTAT 0.4 MG SL tablet Place 0.4 mg under the tongue every 5 (five) minutes as needed for chest pain (MAX 3 TABLETS).     . NON FORMULARY Needles, syringes with needles, glucose reagent test strips    . Omega-3 Fatty Acids (FISH OIL PO) Take 2 tablets by mouth 2 (two) times daily.     Marland Kitchen omeprazole (PRILOSEC) 40 MG capsule Take 1 capsule (40 mg total) by mouth daily. 30 capsule 6  . Saw Palmetto, Serenoa repens, (SAW PALMETTO PO) Take 1 tablet by mouth daily.     . sertraline (ZOLOFT) 100 MG tablet Take 100 mg by mouth daily.    . vitamin E 400 UNIT capsule Take 400 Units by mouth daily.     Marland Kitchen allopurinol (ZYLOPRIM) 100 MG tablet Take 100 mg by mouth daily.     Marland Kitchen losartan (COZAAR) 50 MG tablet Take 1 tablet (50 mg total) by mouth daily. 30 tablet 6   No facility-administered medications prior to visit.      Allergies:   Adhesive [tape]   Social History   Socioeconomic History  . Marital status: Married    Spouse name: Not on file  . Number of children: 4  . Years of education: Not on file  . Highest education level: Not on file  Occupational History  . Occupation: heavy Radio producer  Social Needs  . Financial resource strain: Not on file  . Food insecurity:    Worry: Not on file    Inability: Not on file  . Transportation needs:    Medical: Not on file    Non-medical: Not on file  Tobacco Use  . Smoking status: Former Smoker    Last attempt to quit: 1991    Years since quitting: 28.2  . Smokeless tobacco: Never Used  Substance and Sexual Activity  . Alcohol use: No  . Drug use: No  . Sexual  activity: Not on file  Lifestyle  . Physical activity:    Days per  week: Not on file    Minutes per session: Not on file  . Stress: Not on file  Relationships  . Social connections:    Talks on phone: Not on file    Gets together: Not on file    Attends religious service: Not on file    Active member of club or organization: Not on file    Attends meetings of clubs or organizations: Not on file    Relationship status: Not on file  Other Topics Concern  . Not on file  Social History Narrative  . Not on file     Family History:  The patient's family history includes Heart attack in his brother and father; Heart disease in his sister; Heart failure in his mother.   ROS:   Please see the history of present illness.    ROS All other systems reviewed and are negative.   PHYSICAL EXAM:   VS:  BP 139/64   Pulse (!) 54   Ht 5\' 10"  (1.778 m)   Wt 191 lb 3.2 oz (86.7 kg)   SpO2 95%   BMI 27.43 kg/m    GEN: Well nourished, well developed, in no acute distress  HEENT: normal  Neck: no JVD, carotid bruits, or masses Cardiac: RRR; no murmurs, rubs, or gallops,no edema  Respiratory:  clear to auscultation bilaterally, normal work of breathing GI: soft, nontender, nondistended, + BS MS: no deformity or atrophy  Skin: warm and dry, no rash Neuro:  Alert and Oriented x 3, Strength and sensation are intact Psych: euthymic mood, full affect  Wt Readings from Last 3 Encounters:  04/02/17 191 lb 3.2 oz (86.7 kg)  04/02/17 190 lb 9.6 oz (86.5 kg)  12/03/16 193 lb 3.2 oz (87.6 kg)      Studies/Labs Reviewed:   EKG:  EKG is not ordered today.    Recent Labs: 08/30/2016: BUN 29; Creatinine, Ser 1.51; Hemoglobin 11.7; Platelets 164; Potassium 3.6; Sodium 139   Lipid Panel No results found for: CHOL, TRIG, HDL, CHOLHDL, VLDL, LDLCALC, LDLDIRECT  Additional studies/ records that were reviewed today include:   Echo 02/20/2016 - Left ventricle: The cavity size was moderately dilated.  Wall   thickness was normal. - Mitral valve: There was mild to moderate regurgitation. Valve   area by pressure half-time: 2.42 cm^2. Valve area by continuity   equation (using LVOT flow): 0.99 cm^2. - Left atrium: The atrium was severely dilated. - Right ventricle: Systolic function was moderately reduced. - Right atrium: The atrium was moderately dilated.   ASSESSMENT:    1. Coronary artery disease involving coronary bypass graft of native heart without angina pectoris   2. Paroxysmal atrial fibrillation (HCC)   3. Essential hypertension   4. Hyperlipidemia, unspecified hyperlipidemia type   5. Controlled type 2 diabetes mellitus without complication, with long-term current use of insulin ([place])   6. CKD (chronic kidney disease), stage III ([place])   7. Chronic systolic heart failure ([place])   8. H/O: CVA (cerebrovascular accident)      PLAN:  In order of problems listed above:  1. CAD s/p CABG: Last cardiac catheterization was in 2017, severe disease in left circumflex vessel, not a candidate for redo bypass surgery and not a good PCI candidate either.  Continue beta-blocker and Lipitor, not on aspirin given the need for Eliquis.  2. Paroxysmal atrial fibrillation: On amiodarone and Eliquis. CHA2DS2-Vasc score 7 (CVA, CAD, HTN, DM II, age).  Heart rate borderline bradycardic on the current dose of  metoprolol, however asymptomatic.  We will hold off on adjusting the metoprolol dosage at this time.  3. Hypertension: Blood pressure well controlled  4. Hyperlipidemia: On Lipitor 20 mg daily, lab work monitored by primary care provider  5. DM 2: On insulin and glipizide  6. CKD stage III: Baseline creatinine around 1.4-1.5    Medication Adjustments/Labs and Tests Ordered: Current medicines are reviewed at length with the patient today.  Concerns regarding medicines are outlined above.  Medication changes, Labs and Tests ordered today are listed in the Patient Instructions  below. Patient Instructions  Medication Instructions:  Your physician recommends that you continue on your current medications as directed. Please refer to the Current Medication list given to you today.  Labwork: None   Testing/Procedures: None   Follow-Up: Your physician recommends that you schedule a follow-up appointment in: 4-6 months with Dr Martinique.  Any Other Special Instructions Will Be Listed Below (If Applicable).  If you need a refill on your cardiac medications before your next appointment, please call your pharmacy.     Hilbert Corrigan, [place]  04/04/2017 1:15 AM    [address], [place], [place]  [address] Phone: [phone]; Fax: [phone]

## 2017-04-02 NOTE — Patient Instructions (Addendum)
Medication Instructions:  Your physician recommends that you continue on your current medications as directed. Please refer to the Current Medication list given to you today.  Labwork: None   Testing/Procedures: None   Follow-Up: Your physician recommends that you schedule a follow-up appointment in: 4-6 months with Dr Martinique.  Any Other Special Instructions Will Be Listed Below (If Applicable).  If you need a refill on your cardiac medications before your next appointment, please call your pharmacy.

## 2017-04-04 ENCOUNTER — Encounter: Payer: Self-pay | Admitting: Physician Assistant

## 2017-04-19 ENCOUNTER — Ambulatory Visit (INDEPENDENT_AMBULATORY_CARE_PROVIDER_SITE_OTHER): Payer: Medicare HMO

## 2017-04-19 DIAGNOSIS — I5022 Chronic systolic (congestive) heart failure: Secondary | ICD-10-CM

## 2017-04-19 DIAGNOSIS — Z95810 Presence of automatic (implantable) cardiac defibrillator: Secondary | ICD-10-CM | POA: Diagnosis not present

## 2017-04-19 NOTE — Progress Notes (Signed)
EPIC Encounter for ICM Monitoring  Patient Name: Nathaniel Bolton is a 77 y.o. male Date: 04/19/2017 Primary Care Physican: [name], NP Primary [place] Electrophysiologist: Allred Nephrolog[facility] Weight:197 lbs        Spoke to wife.  Heart Failure questions reviewed, pt asymptomatic.   Thoracic impedance normal.  Prescribed dosage: Furosemide 40 mg 1 tablet twice a day  Labs: 08/30/2016 Creatinine 1.51, BUN 29, Potassium 3.6, Sodium 139, EGFR 44-51 05/15/2016 Creatinine 1.59, BUN 24, Potassium 4.2, Sodium 141, EGFR 43 03/17/2016 Creatinine 1.41, BUN 24, Potassium 3.5, Sodium 136, EGFR 48-55 03/14/2016 Creatinine 1.54, BUN 35, Potassium 4.2, Sodium 140, EGFR 43-50  Recommendations: No changes.  Encouraged to call for fluid symptoms.  Follow-up plan: ICM clinic phone appointment on 05/20/2017.    Copy of ICM check sent to Dr. Rayann Heman.   3 month ICM trend: 04/19/2017    1 Year ICM trend:       [name], RN 04/19/2017 10:21 AM

## 2017-04-26 DIAGNOSIS — R062 Wheezing: Secondary | ICD-10-CM | POA: Diagnosis not present

## 2017-04-26 DIAGNOSIS — R05 Cough: Secondary | ICD-10-CM | POA: Diagnosis not present

## 2017-04-26 DIAGNOSIS — J441 Chronic obstructive pulmonary disease with (acute) exacerbation: Secondary | ICD-10-CM | POA: Diagnosis not present

## 2017-04-26 DIAGNOSIS — I4891 Unspecified atrial fibrillation: Secondary | ICD-10-CM | POA: Diagnosis not present

## 2017-05-09 DIAGNOSIS — J208 Acute bronchitis due to other specified organisms: Secondary | ICD-10-CM | POA: Diagnosis not present

## 2017-05-09 DIAGNOSIS — I509 Heart failure, unspecified: Secondary | ICD-10-CM | POA: Diagnosis not present

## 2017-05-14 DIAGNOSIS — I509 Heart failure, unspecified: Secondary | ICD-10-CM | POA: Diagnosis not present

## 2017-05-14 DIAGNOSIS — I10 Essential (primary) hypertension: Secondary | ICD-10-CM | POA: Diagnosis not present

## 2017-05-14 DIAGNOSIS — R809 Proteinuria, unspecified: Secondary | ICD-10-CM | POA: Diagnosis not present

## 2017-05-14 DIAGNOSIS — R309 Painful micturition, unspecified: Secondary | ICD-10-CM | POA: Diagnosis not present

## 2017-05-14 DIAGNOSIS — E782 Mixed hyperlipidemia: Secondary | ICD-10-CM | POA: Diagnosis not present

## 2017-05-14 DIAGNOSIS — E039 Hypothyroidism, unspecified: Secondary | ICD-10-CM | POA: Diagnosis not present

## 2017-05-14 DIAGNOSIS — E1169 Type 2 diabetes mellitus with other specified complication: Secondary | ICD-10-CM | POA: Diagnosis not present

## 2017-05-14 DIAGNOSIS — N189 Chronic kidney disease, unspecified: Secondary | ICD-10-CM | POA: Diagnosis not present

## 2017-05-14 DIAGNOSIS — D649 Anemia, unspecified: Secondary | ICD-10-CM | POA: Diagnosis not present

## 2017-05-20 ENCOUNTER — Ambulatory Visit (INDEPENDENT_AMBULATORY_CARE_PROVIDER_SITE_OTHER): Payer: Medicare HMO | Admitting: *Deleted

## 2017-05-20 DIAGNOSIS — I5022 Chronic systolic (congestive) heart failure: Secondary | ICD-10-CM

## 2017-05-20 DIAGNOSIS — Z95810 Presence of automatic (implantable) cardiac defibrillator: Secondary | ICD-10-CM | POA: Diagnosis not present

## 2017-05-20 NOTE — Progress Notes (Signed)
Returned call to wife as requested by voice mail.  Patient's weight has been over 200 lbs in the last few weeks but today has dropped to 187 lbs.  Legs still have some swelling.  Patient's nephrologist is adjusting Furosemide. He is currently taking 60 mg twice a day.  She will continue to work with nephrologist on dosage adjustment. Advised to have patient limit salt intake. Will recheck fluid levels on 05/28/2017.

## 2017-05-20 NOTE — Progress Notes (Signed)
EPIC Encounter for ICM Monitoring  Patient Name: RESEAN BRANDER is a 77 y.o. male Date: 05/20/2017 Primary Care Physican: [name], NP Primary [place] Electrophysiologist: Allred Nephrolog[facility] Dry Weight:197 lbs      Attempted call to wife and unable to reach.  Left message to return call regarding transmission.  Transmission reviewed.    Thoracic impedance abnormal suggesting fluid accumulation since 05/09/2017 but is trending close to baseline.  Prescribed dosage: Furosemide 40 mg 1 tablet twice a day  Labs: 08/30/2016 Creatinine 1.51, BUN 29, Potassium 3.6, Sodium 139, EGFR 44-51 05/15/2016 Creatinine 1.59, BUN 24, Potassium 4.2, Sodium 141, EGFR 43 03/17/2016 Creatinine 1.41, BUN 24, Potassium 3.5, Sodium 136, EGFR 48-55 03/14/2016 Creatinine 1.54, BUN 35, Potassium 4.2, Sodium 140, EGFR 43-50  Recommendations: NONE - Unable to reach.  Follow-up plan: ICM clinic phone appointment on 05/28/2017 to recheck fluid levels.    Copy of ICM check sent to Dr. Rayann Heman and Dr. Martinique.   3 month ICM trend: 05/20/2017    1 Year ICM trend:       [name], RN 05/20/2017 11:25 AM

## 2017-05-21 NOTE — Progress Notes (Signed)
Remote ICD transmission.   

## 2017-05-22 ENCOUNTER — Encounter: Payer: Self-pay | Admitting: Cardiology

## 2017-05-22 LAB — CUP PACEART REMOTE DEVICE CHECK
Brady Statistic RV Percent Paced: 0.36 %
HighPow Impedance: 42 Ohm
HighPow Impedance: 59 Ohm
Implantable Lead Location: 753860
Implantable Lead Model: 6947
Implantable Pulse Generator Implant Date: 20180823
Lead Channel Impedance Value: 285 Ohm
Lead Channel Impedance Value: 304 Ohm
Lead Channel Pacing Threshold Amplitude: 1.125 V
Lead Channel Pacing Threshold Pulse Width: 0.4 ms
Lead Channel Setting Pacing Amplitude: 2.5 V
Lead Channel Setting Sensing Sensitivity: 0.3 mV
MDC IDC LEAD IMPLANT DT: 20091217
MDC IDC MSMT BATTERY REMAINING LONGEVITY: 133 mo
MDC IDC MSMT BATTERY VOLTAGE: 3.04 V
MDC IDC MSMT LEADCHNL RV SENSING INTR AMPL: 7.875 mV
MDC IDC MSMT LEADCHNL RV SENSING INTR AMPL: 7.875 mV
MDC IDC SESS DTM: 20190513062825
MDC IDC SET LEADCHNL RV PACING PULSEWIDTH: 0.4 ms

## 2017-05-27 ENCOUNTER — Telehealth: Payer: Self-pay | Admitting: Cardiology

## 2017-05-27 ENCOUNTER — Telehealth: Payer: Self-pay

## 2017-05-27 NOTE — Telephone Encounter (Signed)
Received call from wife.  She is concerned because patient's hands remain swollen and he has chest congestion.  He hands have blisters that look like it is has fluid underneath.  She said she is going to call Dr Doug Sou office to get him an appointment this week since the fluid does not seem to be resolving. The nephrologist has been working with the patient on adjusting diuretic but does not seem effective.  Encouraged to call the office this morning to get him an appointment with PA or NP.  Will recheck fluid levels tomorrow.

## 2017-05-27 NOTE — Telephone Encounter (Signed)
Pt's wife calling regarding pt having fluid build up in feet, legs and hands-wife states hands have "blisters, where the fluid is building up under his skin". Also having SOB - pls advise

## 2017-05-28 ENCOUNTER — Ambulatory Visit (INDEPENDENT_AMBULATORY_CARE_PROVIDER_SITE_OTHER): Payer: Self-pay

## 2017-05-28 DIAGNOSIS — Z95810 Presence of automatic (implantable) cardiac defibrillator: Secondary | ICD-10-CM

## 2017-05-28 DIAGNOSIS — I5022 Chronic systolic (congestive) heart failure: Secondary | ICD-10-CM

## 2017-05-28 NOTE — Progress Notes (Signed)
EPIC Encounter for ICM Monitoring  Patient Name: Nathaniel Bolton is a 77 y.o. male Date: 05/28/2017 Primary Care Physican: [name], NP Primary [place] Electrophysiologist: Allred [facility]rolog[facility] Weight:187 lbs       Heart Failure questions reviewed, pt continues to have leg and hand swelling with hand blisters . Wife is making an appointment with Dr Martinique or PA/NP due to patient has swelling in hands with blisters and leg swelling.  Advised her she could also call the nephrologist if needed for fluid retention as well.     Thoracic impedance returned to baseline normal since 05/20/2017 transmission.  Prescribed dosage: Furosemide 60 mg 1 tablet twice a day (increased by Nephrologist).  Labs: 08/30/2016 Creatinine 1.51, BUN 29, Potassium 3.6, Sodium 139, EGFR 44-51 05/15/2016 Creatinine 1.59, BUN 24, Potassium 4.2, Sodium 141, EGFR 43 03/17/2016 Creatinine 1.41, BUN 24, Potassium 3.5, Sodium 136, EGFR 48-55 03/14/2016 Creatinine 1.54, BUN 35, Potassium 4.2, Sodium 140, EGFR 43-50  Recommendations: No changes.  Encouraged to call for fluid symptoms.  Follow-up plan: ICM clinic phone appointment on 06/24/2017.    Copy of ICM check sent to Dr. Rayann Heman and Dr. Martinique.   3 month ICM trend: 05/28/2017    1 Year ICM trend:       [name], RN 05/28/2017 11:48 AM

## 2017-05-30 DIAGNOSIS — Z1389 Encounter for screening for other disorder: Secondary | ICD-10-CM | POA: Diagnosis not present

## 2017-05-30 DIAGNOSIS — Z1331 Encounter for screening for depression: Secondary | ICD-10-CM | POA: Diagnosis not present

## 2017-05-30 DIAGNOSIS — S60921A Unspecified superficial injury of right hand, initial encounter: Secondary | ICD-10-CM | POA: Diagnosis not present

## 2017-05-30 DIAGNOSIS — Z6829 Body mass index (BMI) 29.0-29.9, adult: Secondary | ICD-10-CM | POA: Diagnosis not present

## 2017-05-30 DIAGNOSIS — E663 Overweight: Secondary | ICD-10-CM | POA: Diagnosis not present

## 2017-05-30 DIAGNOSIS — J069 Acute upper respiratory infection, unspecified: Secondary | ICD-10-CM | POA: Diagnosis not present

## 2017-06-16 DIAGNOSIS — J449 Chronic obstructive pulmonary disease, unspecified: Secondary | ICD-10-CM | POA: Diagnosis not present

## 2017-06-16 DIAGNOSIS — J209 Acute bronchitis, unspecified: Secondary | ICD-10-CM | POA: Diagnosis not present

## 2017-06-24 ENCOUNTER — Telehealth: Payer: Self-pay | Admitting: Cardiology

## 2017-06-24 NOTE — Telephone Encounter (Signed)
LMOVM reminding pt to send remote transmission.   

## 2017-06-25 ENCOUNTER — Telehealth: Payer: Self-pay

## 2017-06-25 NOTE — Telephone Encounter (Signed)
Spoke with wife.  Confirmed that patient will be followed by [facility] device clinic.  She said he has an EP physician that will be following him.  Advised device clinic will can no longer see anything with his device and for follow to call [place].  She understood.

## 2017-06-25 NOTE — Telephone Encounter (Signed)
Attempted ICM call to wife and no answer.  Per [employer], patient has transferred to [place] in [place] on 06/18/2017 for future remote transmission follow ups.

## 2017-06-26 ENCOUNTER — Telehealth: Payer: Self-pay | Admitting: *Deleted

## 2017-06-28 ENCOUNTER — Encounter: Payer: Medicare HMO | Admitting: *Deleted

## 2017-06-28 VITALS — BP 144/70 | HR 51 | Wt 185.0 lb

## 2017-06-28 DIAGNOSIS — Z006 Encounter for examination for normal comparison and control in clinical research program: Secondary | ICD-10-CM

## 2017-06-28 NOTE — Telephone Encounter (Signed)
Phone call for BEAT HF

## 2017-07-01 DIAGNOSIS — J189 Pneumonia, unspecified organism: Secondary | ICD-10-CM | POA: Diagnosis not present

## 2017-07-01 DIAGNOSIS — I509 Heart failure, unspecified: Secondary | ICD-10-CM | POA: Diagnosis not present

## 2017-07-01 NOTE — Progress Notes (Signed)
Beat 15 month appointment  Pt and wife here today for his device check at 15 month. He is doing fairly well. Med changes is he has started Gabapentin 300 mg TID about a month ago.  He was also started on Metolazone 2.5 mg on M, W, F.  He was diagnosed with PNA and URI with bronchitis. He was started on an antibiotic.  Will see patient back in Aug for 18 month follow up.   Pt was re-consented.  Subject met inclusion and exclusion criteria. The informed consent form, study requirements and expectation were reviewed with the subject and questions and concerns were addressed prior to the signing of the consent form. The subject verbalized understanding of the trial requirements. The subject agreed to participate in the BEAT HF trial and singed the informed consent. The informed consent was obtained prior to performance of any protocol-specific procedure for the subject. A copy of the signed informed consent was given to the subject and a copy was placed in the subject's medical record.  Subject re-consented to [place]  Version 0.1 Rev G Local IRB approved 04/19/17   Current Outpatient Medications:  .  allopurinol (ZYLOPRIM) 100 MG tablet, Take 100 mg by mouth daily., Disp: , Rfl:  .  amiodarone (PACERONE) 200 MG tablet, Take 1 tablet (200 mg total) by mouth daily., Disp: 30 tablet, Rfl: 6 .  apixaban (ELIQUIS) 5 MG TABS tablet, Take 1 tablet (5 mg total) by mouth 2 (two) times daily., Disp: 60 tablet, Rfl: 6 .  Ascorbic Acid (VITAMIN C) 1000 MG tablet, Take 1,000 mg by mouth daily., Disp: , Rfl:  .  atorvastatin (LIPITOR) 20 MG tablet, Take 1 tablet (20 mg total) by mouth daily., Disp: 30 tablet, Rfl: 6 .  carvedilol (COREG) 6.25 MG tablet, Take 1 tablet (6.25 mg total) by mouth 2 (two) times daily with a meal., Disp: 60 tablet, Rfl: 6 .  Cholecalciferol (VITAMIN D3) 2000 units TABS, Take 2,000 Units by mouth 2 (two) times daily. , Disp: , Rfl:  .  furosemide (LASIX) 40 MG tablet, Take 1 tablet (40 mg  total) by mouth 2 (two) times daily., Disp: 60 tablet, Rfl: 6 .  gabapentin (NEURONTIN) 300 MG capsule, Take 300 mg by mouth 3 (three) times daily., Disp: , Rfl:  .  glipiZIDE (GLUCOTROL) 5 MG tablet, Take 5 mg by mouth daily. , Disp: , Rfl:  .  insulin aspart protamine- aspart (NOVOLOG MIX 70/30) (70-30) 100 UNIT/ML injection, Inject 5-10 Units into the skin 3 (three) times daily as needed (IF blood sugar is <150). , Disp: , Rfl:  .  losartan (COZAAR) 50 MG tablet, Take 50 mg by mouth daily., Disp: , Rfl:  .  metolazone (ZAROXOLYN) 2.5 MG tablet, TAKE 1 TABLET BY MOUTH 3 TIMES A WEEK ON MONDAYS WEDNESDAYS AND FRIDAYS 30 MINUTES BEFORE MORNING DOSE OF LASIX, Disp: , Rfl: 2 .  NITROSTAT 0.4 MG SL tablet, Place 0.4 mg under the tongue every 5 (five) minutes as needed for chest pain (MAX 3 TABLETS). , Disp: , Rfl:  .  NON FORMULARY, Needles, syringes with needles, glucose reagent test strips, Disp: , Rfl:  .  Omega-3 Fatty Acids (FISH OIL PO), Take 2 tablets by mouth 2 (two) times daily. , Disp: , Rfl:  .  omeprazole (PRILOSEC) 40 MG capsule, Take 1 capsule (40 mg total) by mouth daily., Disp: 30 capsule, Rfl: 6 .  predniSONE (STERAPRED UNI-PAK 21 TAB) 5 MG (21) TBPK tablet, , Disp: , Rfl:  0 .  Saw Palmetto, Serenoa repens, (SAW PALMETTO PO), Take 1 tablet by mouth daily. , Disp: , Rfl:  .  sertraline (ZOLOFT) 100 MG tablet, Take 100 mg by mouth daily., Disp: , Rfl:  .  SSD 1 % cream, APPLY CREAM TO AFFECTED AREA(S) TWICE DAILY, Disp: , Rfl: 0 .  VENTOLIN HFA 108 (90 Base) MCG/ACT inhaler, , Disp: , Rfl: 0 .  vitamin E 400 UNIT capsule, Take 400 Units by mouth daily. , Disp: , Rfl:

## 2017-07-02 DIAGNOSIS — J189 Pneumonia, unspecified organism: Secondary | ICD-10-CM | POA: Diagnosis not present

## 2017-07-24 ENCOUNTER — Encounter: Payer: Self-pay | Admitting: Gastroenterology

## 2017-08-02 DIAGNOSIS — R05 Cough: Secondary | ICD-10-CM | POA: Diagnosis not present

## 2017-08-02 DIAGNOSIS — G4733 Obstructive sleep apnea (adult) (pediatric): Secondary | ICD-10-CM | POA: Diagnosis not present

## 2017-08-02 DIAGNOSIS — J4540 Moderate persistent asthma, uncomplicated: Secondary | ICD-10-CM | POA: Diagnosis not present

## 2017-08-02 DIAGNOSIS — R5383 Other fatigue: Secondary | ICD-10-CM | POA: Diagnosis not present

## 2017-08-17 DIAGNOSIS — G4733 Obstructive sleep apnea (adult) (pediatric): Secondary | ICD-10-CM | POA: Diagnosis not present

## 2017-08-19 ENCOUNTER — Telehealth: Payer: Self-pay | Admitting: Cardiology

## 2017-08-19 ENCOUNTER — Encounter: Payer: Medicare HMO | Admitting: *Deleted

## 2017-08-19 NOTE — Telephone Encounter (Signed)
LMOVM reminding pt to send remote transmission.   

## 2017-08-27 DIAGNOSIS — J4540 Moderate persistent asthma, uncomplicated: Secondary | ICD-10-CM | POA: Diagnosis not present

## 2017-08-27 DIAGNOSIS — R5383 Other fatigue: Secondary | ICD-10-CM | POA: Diagnosis not present

## 2017-08-27 DIAGNOSIS — G4733 Obstructive sleep apnea (adult) (pediatric): Secondary | ICD-10-CM | POA: Diagnosis not present

## 2017-09-11 ENCOUNTER — Encounter: Payer: Self-pay | Admitting: Gastroenterology

## 2017-09-15 DIAGNOSIS — J189 Pneumonia, unspecified organism: Secondary | ICD-10-CM | POA: Diagnosis not present

## 2017-09-16 ENCOUNTER — Encounter: Payer: Self-pay | Admitting: Gastroenterology

## 2017-09-19 ENCOUNTER — Ambulatory Visit: Payer: Medicare HMO | Admitting: Gastroenterology

## 2017-09-19 DIAGNOSIS — G4733 Obstructive sleep apnea (adult) (pediatric): Secondary | ICD-10-CM | POA: Diagnosis not present

## 2017-09-20 DIAGNOSIS — R5383 Other fatigue: Secondary | ICD-10-CM | POA: Diagnosis not present

## 2017-09-20 DIAGNOSIS — G4733 Obstructive sleep apnea (adult) (pediatric): Secondary | ICD-10-CM | POA: Diagnosis not present

## 2017-09-20 DIAGNOSIS — J4540 Moderate persistent asthma, uncomplicated: Secondary | ICD-10-CM | POA: Diagnosis not present

## 2017-09-26 ENCOUNTER — Encounter: Payer: Medicare HMO | Admitting: *Deleted

## 2017-09-26 VITALS — BP 110/55 | HR 55 | Wt 189.0 lb

## 2017-09-26 DIAGNOSIS — Z006 Encounter for examination for normal comparison and control in clinical research program: Secondary | ICD-10-CM

## 2017-09-27 NOTE — Research (Signed)
Pt doing well, came in today for a Beat device check. No changes in his cardiac meds. He has been diagnosed with OSA and going to have to start wearing a CPAP at night. [facility] due to [facility]. His pneumonia is better but on his second dose of antibiotics.   Will see him back in 3 months for his 21 month check.

## 2017-10-03 DIAGNOSIS — Z6829 Body mass index (BMI) 29.0-29.9, adult: Secondary | ICD-10-CM | POA: Diagnosis not present

## 2017-10-03 DIAGNOSIS — N481 Balanitis: Secondary | ICD-10-CM | POA: Diagnosis not present

## 2017-10-03 DIAGNOSIS — G4733 Obstructive sleep apnea (adult) (pediatric): Secondary | ICD-10-CM | POA: Diagnosis not present

## 2017-10-04 ENCOUNTER — Encounter: Payer: Medicare HMO | Admitting: Gastroenterology

## 2017-10-08 ENCOUNTER — Ambulatory Visit: Payer: Medicare HMO | Admitting: Gastroenterology

## 2017-10-22 DIAGNOSIS — G4733 Obstructive sleep apnea (adult) (pediatric): Secondary | ICD-10-CM | POA: Diagnosis not present

## 2017-10-31 DIAGNOSIS — G4733 Obstructive sleep apnea (adult) (pediatric): Secondary | ICD-10-CM | POA: Diagnosis not present

## 2017-11-02 DIAGNOSIS — G4733 Obstructive sleep apnea (adult) (pediatric): Secondary | ICD-10-CM | POA: Diagnosis not present

## 2017-11-04 DIAGNOSIS — G4733 Obstructive sleep apnea (adult) (pediatric): Secondary | ICD-10-CM | POA: Diagnosis not present

## 2017-11-14 ENCOUNTER — Emergency Department (HOSPITAL_COMMUNITY): Payer: Medicare HMO

## 2017-11-14 ENCOUNTER — Telehealth: Payer: Self-pay

## 2017-11-14 ENCOUNTER — Inpatient Hospital Stay (HOSPITAL_COMMUNITY)
Admission: EM | Admit: 2017-11-14 | Discharge: 2017-11-18 | DRG: 291 | Disposition: A | Discharge status: Home / Self Care | Payer: Medicare HMO | Attending: Family Medicine | Admitting: Family Medicine

## 2017-11-14 ENCOUNTER — Encounter (HOSPITAL_COMMUNITY): Payer: Self-pay | Admitting: *Deleted

## 2017-11-14 ENCOUNTER — Other Ambulatory Visit: Payer: Self-pay

## 2017-11-14 DIAGNOSIS — I472 Ventricular tachycardia: Secondary | ICD-10-CM | POA: Diagnosis not present

## 2017-11-14 DIAGNOSIS — I252 Old myocardial infarction: Secondary | ICD-10-CM | POA: Diagnosis not present

## 2017-11-14 DIAGNOSIS — Z7984 Long term (current) use of oral hypoglycemic drugs: Secondary | ICD-10-CM

## 2017-11-14 DIAGNOSIS — Z87891 Personal history of nicotine dependence: Secondary | ICD-10-CM | POA: Diagnosis not present

## 2017-11-14 DIAGNOSIS — N183 Chronic kidney disease, stage 3 (moderate): Secondary | ICD-10-CM | POA: Diagnosis not present

## 2017-11-14 DIAGNOSIS — E782 Mixed hyperlipidemia: Secondary | ICD-10-CM | POA: Diagnosis present

## 2017-11-14 DIAGNOSIS — Z9049 Acquired absence of other specified parts of digestive tract: Secondary | ICD-10-CM

## 2017-11-14 DIAGNOSIS — I5023 Acute on chronic systolic (congestive) heart failure: Secondary | ICD-10-CM

## 2017-11-14 DIAGNOSIS — J9601 Acute respiratory failure with hypoxia: Secondary | ICD-10-CM | POA: Diagnosis present

## 2017-11-14 DIAGNOSIS — I2510 Atherosclerotic heart disease of native coronary artery without angina pectoris: Secondary | ICD-10-CM | POA: Diagnosis present

## 2017-11-14 DIAGNOSIS — R0602 Shortness of breath: Secondary | ICD-10-CM | POA: Diagnosis not present

## 2017-11-14 DIAGNOSIS — E785 Hyperlipidemia, unspecified: Secondary | ICD-10-CM | POA: Diagnosis not present

## 2017-11-14 DIAGNOSIS — N189 Chronic kidney disease, unspecified: Secondary | ICD-10-CM | POA: Diagnosis present

## 2017-11-14 DIAGNOSIS — J449 Chronic obstructive pulmonary disease, unspecified: Secondary | ICD-10-CM | POA: Diagnosis present

## 2017-11-14 DIAGNOSIS — Z7901 Long term (current) use of anticoagulants: Secondary | ICD-10-CM | POA: Diagnosis not present

## 2017-11-14 DIAGNOSIS — I480 Paroxysmal atrial fibrillation: Secondary | ICD-10-CM | POA: Diagnosis not present

## 2017-11-14 DIAGNOSIS — I5043 Acute on chronic combined systolic (congestive) and diastolic (congestive) heart failure: Secondary | ICD-10-CM | POA: Diagnosis not present

## 2017-11-14 DIAGNOSIS — I130 Hypertensive heart and chronic kidney disease with heart failure and stage 1 through stage 4 chronic kidney disease, or unspecified chronic kidney disease: Secondary | ICD-10-CM | POA: Diagnosis not present

## 2017-11-14 DIAGNOSIS — Z95810 Presence of automatic (implantable) cardiac defibrillator: Secondary | ICD-10-CM | POA: Diagnosis not present

## 2017-11-14 DIAGNOSIS — I255 Ischemic cardiomyopathy: Secondary | ICD-10-CM | POA: Diagnosis present

## 2017-11-14 DIAGNOSIS — Z951 Presence of aortocoronary bypass graft: Secondary | ICD-10-CM

## 2017-11-14 DIAGNOSIS — E1122 Type 2 diabetes mellitus with diabetic chronic kidney disease: Secondary | ICD-10-CM | POA: Diagnosis present

## 2017-11-14 DIAGNOSIS — D631 Anemia in chronic kidney disease: Secondary | ICD-10-CM | POA: Diagnosis present

## 2017-11-14 DIAGNOSIS — R0902 Hypoxemia: Secondary | ICD-10-CM | POA: Diagnosis not present

## 2017-11-14 DIAGNOSIS — R600 Localized edema: Secondary | ICD-10-CM | POA: Diagnosis not present

## 2017-11-14 DIAGNOSIS — Z91048 Other nonmedicinal substance allergy status: Secondary | ICD-10-CM

## 2017-11-14 DIAGNOSIS — R609 Edema, unspecified: Secondary | ICD-10-CM

## 2017-11-14 DIAGNOSIS — Z8719 Personal history of other diseases of the digestive system: Secondary | ICD-10-CM

## 2017-11-14 DIAGNOSIS — I509 Heart failure, unspecified: Secondary | ICD-10-CM

## 2017-11-14 DIAGNOSIS — E1142 Type 2 diabetes mellitus with diabetic polyneuropathy: Secondary | ICD-10-CM | POA: Diagnosis present

## 2017-11-14 DIAGNOSIS — M109 Gout, unspecified: Secondary | ICD-10-CM | POA: Diagnosis not present

## 2017-11-14 DIAGNOSIS — I5022 Chronic systolic (congestive) heart failure: Secondary | ICD-10-CM | POA: Diagnosis present

## 2017-11-14 DIAGNOSIS — I10 Essential (primary) hypertension: Secondary | ICD-10-CM | POA: Diagnosis not present

## 2017-11-14 DIAGNOSIS — D696 Thrombocytopenia, unspecified: Secondary | ICD-10-CM | POA: Diagnosis present

## 2017-11-14 DIAGNOSIS — Z8249 Family history of ischemic heart disease and other diseases of the circulatory system: Secondary | ICD-10-CM

## 2017-11-14 DIAGNOSIS — Z8673 Personal history of transient ischemic attack (TIA), and cerebral infarction without residual deficits: Secondary | ICD-10-CM

## 2017-11-14 DIAGNOSIS — E1159 Type 2 diabetes mellitus with other circulatory complications: Secondary | ICD-10-CM | POA: Diagnosis present

## 2017-11-14 DIAGNOSIS — I340 Nonrheumatic mitral (valve) insufficiency: Secondary | ICD-10-CM | POA: Diagnosis not present

## 2017-11-14 LAB — CBC WITH DIFFERENTIAL/PLATELET
ABS IMMATURE GRANULOCYTES: 0.03 10*3/uL (ref 0.00–0.07)
BASOS ABS: 0 10*3/uL (ref 0.0–0.1)
Basophils Relative: 1 %
Eosinophils Absolute: 0.1 10*3/uL (ref 0.0–0.5)
Eosinophils Relative: 2 %
HEMATOCRIT: 36.5 % — AB (ref 39.0–52.0)
HEMOGLOBIN: 11.9 g/dL — AB (ref 13.0–17.0)
IMMATURE GRANULOCYTES: 0 %
LYMPHS ABS: 2 10*3/uL (ref 0.7–4.0)
LYMPHS PCT: 29 %
MCH: 34.9 pg — ABNORMAL HIGH (ref 26.0–34.0)
MCHC: 32.6 g/dL (ref 30.0–36.0)
MCV: 107 fL — ABNORMAL HIGH (ref 80.0–100.0)
Monocytes Absolute: 0.9 10*3/uL (ref 0.1–1.0)
Monocytes Relative: 13 %
NEUTROS PCT: 55 %
NRBC: 0 % (ref 0.0–0.2)
Neutro Abs: 3.8 10*3/uL (ref 1.7–7.7)
Platelets: 145 10*3/uL — ABNORMAL LOW (ref 150–400)
RBC: 3.41 MIL/uL — ABNORMAL LOW (ref 4.22–5.81)
RDW: 12.6 % (ref 11.5–15.5)
WBC: 6.9 10*3/uL (ref 4.0–10.5)

## 2017-11-14 LAB — COMPREHENSIVE METABOLIC PANEL
ALT: 17 U/L (ref 0–44)
ANION GAP: 8 (ref 5–15)
AST: 20 U/L (ref 15–41)
Albumin: 3.3 g/dL — ABNORMAL LOW (ref 3.5–5.0)
Alkaline Phosphatase: 86 U/L (ref 38–126)
BUN: 29 mg/dL — ABNORMAL HIGH (ref 8–23)
CHLORIDE: 104 mmol/L (ref 98–111)
CO2: 28 mmol/L (ref 22–32)
Calcium: 9 mg/dL (ref 8.9–10.3)
Creatinine, Ser: 1.61 mg/dL — ABNORMAL HIGH (ref 0.61–1.24)
GFR calc non Af Amer: 40 mL/min — ABNORMAL LOW (ref >60)
GFR, EST AFRICAN AMERICAN: 46 mL/min — AB (ref >60)
Glucose, Bld: 168 mg/dL — ABNORMAL HIGH (ref 70–99)
Potassium: 3.6 mmol/L (ref 3.5–5.1)
SODIUM: 140 mmol/L (ref 135–145)
Total Bilirubin: 1 mg/dL (ref 0.3–1.2)
Total Protein: 6.3 g/dL — ABNORMAL LOW (ref 6.5–8.1)

## 2017-11-14 LAB — I-STAT TROPONIN, ED: Troponin i, poc: 0.01 ng/mL (ref 0.00–0.08)

## 2017-11-14 LAB — GLUCOSE, CAPILLARY: GLUCOSE-CAPILLARY: 132 mg/dL — AB (ref 70–99)

## 2017-11-14 LAB — BRAIN NATRIURETIC PEPTIDE: B Natriuretic Peptide: 395.1 pg/mL — ABNORMAL HIGH (ref 0.0–100.0)

## 2017-11-14 MED ORDER — ONDANSETRON HCL 4 MG PO TABS: 4.0000 mg | ORAL_TABLET | Freq: Four times a day (QID) | ORAL | Status: DC | PRN

## 2017-11-14 MED ORDER — CARVEDILOL 6.25 MG PO TABS
6.2500 mg | ORAL_TABLET | Freq: Two times a day (BID) | ORAL | Status: DC
  Administered 2017-11-15: 6.25 mg via ORAL
  Filled 2017-11-14: qty 1

## 2017-11-14 MED ORDER — VITAMIN C 500 MG PO TABS
1000.0000 mg | ORAL_TABLET | Freq: Every day | ORAL | Status: DC
  Administered 2017-11-15: 1000 mg via ORAL
  Filled 2017-11-14: qty 2

## 2017-11-14 MED ORDER — ATORVASTATIN CALCIUM 20 MG PO TABS
20.0000 mg | ORAL_TABLET | Freq: Every day | ORAL | Status: DC
  Administered 2017-11-15 – 2017-11-17 (×3): 20 mg via ORAL
  Filled 2017-11-14 (×3): qty 1

## 2017-11-14 MED ORDER — APIXABAN 5 MG PO TABS
5.0000 mg | ORAL_TABLET | Freq: Two times a day (BID) | ORAL | Status: DC
  Administered 2017-11-15 – 2017-11-18 (×8): 5 mg via ORAL
  Filled 2017-11-14 (×8): qty 1

## 2017-11-14 MED ORDER — ONDANSETRON HCL 4 MG/2ML IJ SOLN: 4.0000 mg | Freq: Four times a day (QID) | INTRAMUSCULAR | Status: DC | PRN

## 2017-11-14 MED ORDER — INSULIN ASPART PROT & ASPART (70-30 MIX) 100 UNIT/ML ~~LOC~~ SUSP: 5.0000 [IU] | Freq: Three times a day (TID) | SUBCUTANEOUS | Status: DC | PRN

## 2017-11-14 MED ORDER — PANTOPRAZOLE SODIUM 40 MG PO TBEC
40.0000 mg | DELAYED_RELEASE_TABLET | Freq: Every day | ORAL | Status: DC
  Administered 2017-11-15 – 2017-11-18 (×4): 40 mg via ORAL
  Filled 2017-11-14 (×4): qty 1

## 2017-11-14 MED ORDER — VITAMIN D 25 MCG (1000 UNIT) PO TABS
2000.0000 [IU] | ORAL_TABLET | Freq: Two times a day (BID) | ORAL | Status: DC
  Administered 2017-11-15 – 2017-11-18 (×7): 2000 [IU] via ORAL

## 2017-11-14 MED ORDER — INSULIN ASPART 100 UNIT/ML ~~LOC~~ SOLN
0.0000 [IU] | Freq: Every day | SUBCUTANEOUS | Status: DC
  Administered 2017-11-15 – 2017-11-16 (×2): 2 [IU] via SUBCUTANEOUS

## 2017-11-14 MED ORDER — INSULIN ASPART 100 UNIT/ML ~~LOC~~ SOLN
0.0000 [IU] | Freq: Three times a day (TID) | SUBCUTANEOUS | Status: DC
  Administered 2017-11-15: 3 [IU] via SUBCUTANEOUS
  Administered 2017-11-15: 1 [IU] via SUBCUTANEOUS
  Administered 2017-11-15: 3 [IU] via SUBCUTANEOUS
  Administered 2017-11-16: 5 [IU] via SUBCUTANEOUS
  Administered 2017-11-16: 1 [IU] via SUBCUTANEOUS
  Administered 2017-11-16: 3 [IU] via SUBCUTANEOUS
  Administered 2017-11-17: 7 [IU] via SUBCUTANEOUS
  Administered 2017-11-17: 3 [IU] via SUBCUTANEOUS
  Administered 2017-11-17 – 2017-11-18 (×2): 2 [IU] via SUBCUTANEOUS

## 2017-11-14 MED ORDER — LOSARTAN POTASSIUM 50 MG PO TABS
50.0000 mg | ORAL_TABLET | Freq: Every day | ORAL | Status: DC
  Administered 2017-11-15 – 2017-11-18 (×4): 50 mg via ORAL
  Filled 2017-11-14 (×4): qty 1

## 2017-11-14 MED ORDER — VITAMIN E 180 MG (400 UNIT) PO CAPS
400.0000 [IU] | ORAL_CAPSULE | Freq: Every day | ORAL | Status: DC
  Administered 2017-11-15 – 2017-11-18 (×4): 400 [IU] via ORAL
  Filled 2017-11-14 (×4): qty 1

## 2017-11-14 MED ORDER — METOLAZONE 5 MG PO TABS
5.0000 mg | ORAL_TABLET | Freq: Every day | ORAL | Status: DC
  Filled 2017-11-14 (×3): qty 1

## 2017-11-14 MED ORDER — SERTRALINE HCL 100 MG PO TABS
100.0000 mg | ORAL_TABLET | Freq: Every day | ORAL | Status: DC
  Administered 2017-11-15 – 2017-11-18 (×4): 100 mg via ORAL
  Filled 2017-11-14 (×4): qty 1

## 2017-11-14 MED ORDER — ALBUTEROL SULFATE (2.5 MG/3ML) 0.083% IN NEBU: 2.5000 mg | INHALATION_SOLUTION | Freq: Four times a day (QID) | RESPIRATORY_TRACT | Status: DC | PRN

## 2017-11-14 MED ORDER — OMEGA-3-ACID ETHYL ESTERS 1 G PO CAPS
1.0000 g | ORAL_CAPSULE | Freq: Every day | ORAL | Status: DC
  Administered 2017-11-15 – 2017-11-18 (×4): 1 g via ORAL
  Filled 2017-11-14 (×4): qty 1

## 2017-11-14 MED ORDER — FUROSEMIDE 10 MG/ML IJ SOLN
40.0000 mg | Freq: Two times a day (BID) | INTRAMUSCULAR | Status: DC
  Filled 2017-11-14: qty 4

## 2017-11-14 MED ORDER — NITROGLYCERIN 0.4 MG SL SUBL: 0.4000 mg | SUBLINGUAL_TABLET | SUBLINGUAL | Status: DC | PRN

## 2017-11-14 MED ORDER — GLIPIZIDE 5 MG PO TABS: 5.0000 mg | ORAL_TABLET | Freq: Every day | ORAL | Status: DC

## 2017-11-14 MED ORDER — GABAPENTIN 300 MG PO CAPS
300.0000 mg | ORAL_CAPSULE | Freq: Three times a day (TID) | ORAL | Status: DC
  Administered 2017-11-15 – 2017-11-18 (×11): 300 mg via ORAL
  Filled 2017-11-14 (×11): qty 1

## 2017-11-14 MED ORDER — AMIODARONE HCL 200 MG PO TABS
200.0000 mg | ORAL_TABLET | Freq: Every day | ORAL | Status: DC
  Administered 2017-11-15 – 2017-11-18 (×4): 200 mg via ORAL
  Filled 2017-11-14 (×4): qty 1

## 2017-11-14 MED ORDER — ALLOPURINOL 100 MG PO TABS
100.0000 mg | ORAL_TABLET | Freq: Every day | ORAL | Status: DC
  Administered 2017-11-15 – 2017-11-18 (×4): 100 mg via ORAL
  Filled 2017-11-14 (×4): qty 1

## 2017-11-14 MED ORDER — SILVER SULFADIAZINE 1 % EX CREA
1.0000 "application " | TOPICAL_CREAM | Freq: Two times a day (BID) | CUTANEOUS | Status: DC
  Filled 2017-11-14: qty 85

## 2017-11-14 NOTE — ED Triage Notes (Signed)
Pt reports sob that started today. Has swelling to bilateral legs, has cardiac hx. Denies any cp.

## 2017-11-14 NOTE — ED Provider Notes (Signed)
[facility] EMERGENCY DEPARTMENT Provider Note   CSN: [id number] Arrival date & time: 11/14/17  1827     History   Chief Complaint Chief Complaint  Patient presents with  . Shortness of Breath  . Leg Swelling    HPI Nathaniel Bolton is a 77 y.o. male with a PMH of systolic CHF presenting with constant shortness of breath onset today. Patient reports bilateral leg edema and abdominal distention for the last 3 weeks. Patient reports shortness of breath is worse with exertion and it is alleviated with rest. Patient states Lasix has been increased by Dr. Martinique without relief. Patient denies chest pain, palpitations, abdominal pain, vomiting, cough, fever, night sweats, or chills.   HPI  Past Medical History:  Diagnosis Date  . CAD (coronary artery disease)   . CKD stage 3 due to type 2 diabetes mellitus ([place])   . Diabetes mellitus    Type 2  . Erectile dysfunction   . Gout   . Hyperlipidemia   . Hypertension   . ICD (implantable cardiac defibrillator) in place   . Myocardial infarction, old    x2 in [place]. S/P CABG 2003, Dr Roxy Manns  . Obstructive lung disease (HCC)    Moderate per prior PFT's  . Paroxysmal atrial fibrillation ([place]) 3/17   discovered on event monitor  . Stroke [facility])    2013  . Systolic CHF, acute on chronic (HCC)    EF is 27% per myoview and echo October 2012  . Ventricular tachycardia ([place]) 08/2014   175 bpm    Patient Active Problem List   Diagnosis Date Noted  . CHF (congestive heart failure) ([place]) 03/16/2016  . Medication management 12/22/2015  . Mixed hyperlipidemia 10/28/2015  . Paroxysmal atrial fibrillation ([place]) 10/28/2015  . Type 2 diabetes mellitus with circulatory disorder, without long-term current use of insulin ([place]) 10/28/2015  . Diabetic polyneuropathy associated with type 2 diabetes mellitus ([place]) 08/12/2015  . Skin ulcer of toe of left foot, limited to breakdown of skin ([place]) 08/12/2015  . CAD in native  artery 07/15/2015  . Cardiac defibrillator in situ 07/15/2015  . CKD (chronic kidney disease) 07/15/2015  . Essential hypertension 07/15/2015  . Hyperlipidemia 07/15/2015  . S/P CABG (coronary artery bypass graft) 07/15/2015  . Syncope 07/15/2015  . Ventricular tachycardia (paroxysmal) ([place]) 07/15/2015  . Atrial fibrillation ([place]) 04/16/2015  . Ventricular tachycardia ([place]) 09/07/2014  . CVA (cerebral infarction) 03/29/2011  . Ischemic cardiomyopathy 02/08/2011  . URI (upper respiratory infection) 11/27/2010  . Chronic systolic heart failure ([place]) 11/06/2010  . Automatic implantable cardioverter-defibrillator in situ 10/23/2010  . CAD (coronary artery disease)   . Hypertension   . Diabetes mellitus ([place])   . Myocardial infarction, old     Past Surgical History:  Procedure Laterality Date  . BYPASS GRAFT     x5  . CARDIAC DEFIBRILLATOR PLACEMENT  2008   [facility] by Dr Elonda Husky at [facility]  . cataract surgery    . CHOLECYSTECTOMY    . COLONOSCOPY  12/23/2006   Small colonic polyps, status post polypectomy. Interanl hemorrhoids.   . ICD GENERATOR CHANGEOUT N/A 08/30/2016   MDT Visia AF VR ICD implanted by Dr Rayann Heman  . KNEE ARTHROSCOPY     right  . UMBILICAL HERNIA REPAIR          Home Medications    Prior to Admission medications   Medication Sig Start Date End Date Taking? Authorizing Provider  allopurinol (ZYLOPRIM) 100 MG  tablet Take 100 mg by mouth daily.    [provider]  amiodarone (PACERONE) 200 MG tablet Take 1 tablet (200 mg total) by mouth daily. 10/15/16   Martinique, Peter M, MD  apixaban (ELIQUIS) 5 MG TABS tablet Take 1 tablet (5 mg total) by mouth 2 (two) times daily. 10/15/16   Martinique, Peter M, MD  Ascorbic Acid (VITAMIN C) 1000 MG tablet Take 1,000 mg by mouth daily.    [provider]  atorvastatin (LIPITOR) 20 MG tablet Take 1 tablet (20 mg total) by mouth daily. 10/15/16   Martinique, Peter M, MD  carvedilol (COREG) 6.25 MG tablet  Take 1 tablet (6.25 mg total) by mouth 2 (two) times daily with a meal. 10/15/16   Martinique, Peter M, MD  cefdinir (OMNICEF) 300 MG capsule Take 300 mg by mouth 2 (two) times daily. 09/15/17   [provider]  Cholecalciferol (VITAMIN D3) 2000 units TABS Take 2,000 Units by mouth 2 (two) times daily.     [provider]  doxycycline (VIBRA-TABS) 100 MG tablet Take 100 mg by mouth 2 (two) times daily. 09/15/17   [provider]  furosemide (LASIX) 40 MG tablet Take 1 tablet (40 mg total) by mouth 2 (two) times daily. 10/15/16 10/10/17  Martinique, Peter M, MD  gabapentin (NEURONTIN) 300 MG capsule Take 300 mg by mouth 3 (three) times daily.    [facility]  glipiZIDE (GLUCOTROL) 5 MG tablet Take 5 mg by mouth daily.  07/24/15   [provider]  insulin aspart protamine- aspart (NOVOLOG MIX 70/30) (70-30) 100 UNIT/ML injection Inject 5-10 Units into the skin 3 (three) times daily as needed (IF blood sugar is <150).     [provider]  losartan (COZAAR) 50 MG tablet Take 50 mg by mouth daily.    [provider]  metolazone (ZAROXOLYN) 2.5 MG tablet TAKE 1 TABLET BY MOUTH 3 TIMES A WEEK ON MONDAYS WEDNESDAYS AND FRIDAYS 30 MINUTES BEFORE MORNING DOSE OF LASIX 06/10/17   Adegoroye, Adeyinka A, MD  NITROSTAT 0.4 MG SL tablet Place 0.4 mg under the tongue every 5 (five) minutes as needed for chest pain (MAX 3 TABLETS).  08/17/13   [provider]  NON FORMULARY Needles, syringes with needles, glucose reagent test strips    [provider]  Omega-3 Fatty Acids (FISH OIL PO) Take 2 tablets by mouth 2 (two) times daily.     [provider]  omeprazole (PRILOSEC) 40 MG capsule Take 1 capsule (40 mg total) by mouth daily. 10/15/16   Martinique, Peter M, MD  predniSONE (STERAPRED UNI-PAK 21 TAB) 5 MG (21) TBPK tablet  04/26/17   [provider]  Saw Palmetto, Serenoa repens, (SAW PALMETTO PO) Take 1 tablet by mouth daily.     [provider]  sertraline (ZOLOFT) 100 MG tablet Take 100 mg by mouth daily.    [provider]  SSD 1 % cream Apply 1 application topically 2 (two) times daily.  05/30/17   Moon, Amy A, NP  VENTOLIN HFA 108 (90 Base) MCG/ACT inhaler  04/26/17   [provider]  vitamin E 400 UNIT capsule Take 400 Units by mouth daily.     [provider]    Family History Family History  Problem Relation Age of Onset  . Heart failure Mother   . Heart attack Father   . Heart disease Sister        valve replaced  . Heart attack Brother  CABG, aortic grafting    Social History Social History   Tobacco Use  . Smoking status: Former Smoker    Last attempt to quit: 1991    Years since quitting: 28.8  . Smokeless tobacco: Never Used  Substance Use Topics  . Alcohol use: No  . Drug use: No     Allergies   Adhesive [tape]   Review of Systems Review of Systems  Constitutional: Negative for chills, diaphoresis, fatigue, fever and unexpected weight change.  HENT: Negative for congestion and rhinorrhea.   Eyes: Negative for visual disturbance.  Respiratory: Positive for shortness of breath. Negative for cough, chest tightness, wheezing and stridor.   Cardiovascular: Positive for leg swelling. Negative for chest pain and palpitations.  Gastrointestinal: Positive for abdominal distention. Negative for abdominal pain, nausea and vomiting.  Genitourinary: Negative for difficulty urinating and dysuria.  Skin: Negative for pallor and rash.  Allergic/Immunologic: Negative for environmental allergies.  Neurological: Negative for dizziness, syncope, speech difficulty, weakness, light-headedness and numbness.  Psychiatric/Behavioral: The patient is not nervous/anxious.      Physical Exam Updated Vital Signs BP (!) 144/66   Pulse (!) 42   Temp 99 F (37.2 C) (Oral)   Resp 13   SpO2 99%   Physical Exam  Constitutional: He appears well-developed and well-nourished.  No distress.  HENT:  Head: Normocephalic and atraumatic.  Neck: Normal range of motion. Neck supple.  Cardiovascular: Normal rate, regular rhythm, normal heart sounds, intact distal pulses and normal pulses. Exam reveals no gallop and no friction rub.  Pulses:      Radial pulses are 2+ on the right side, and 2+ on the left side.       Dorsalis pedis pulses are 2+ on the right side, and 2+ on the left side.  Pulmonary/Chest: Effort normal and breath sounds normal. No accessory muscle usage. No respiratory distress. He has no wheezes. He has no rhonchi. He has no rales. He exhibits no tenderness.  Abdominal: Soft. He exhibits distension. There is no tenderness. There is no guarding. A hernia is present.  Musculoskeletal: Normal range of motion.       Right lower leg: He exhibits edema (2+ pitting edema noted.). He exhibits no tenderness.       Left lower leg: He exhibits edema (2+ pitting edema noted.). He exhibits no tenderness.  Neurological: He is alert.  Skin: Skin is warm. Capillary refill takes less than 2 seconds. No rash noted. He is not diaphoretic. No pallor.  Psychiatric: He has a normal mood and affect.  Nursing note and vitals reviewed.    ED Treatments / Results  Labs (all labs ordered are listed, but only abnormal results are displayed) Labs Reviewed  COMPREHENSIVE METABOLIC PANEL - Abnormal; Notable for the following components:      Result Value   Glucose, Bld 168 (*)    BUN 29 (*)    Creatinine, Ser 1.61 (*)    Total Protein 6.3 (*)    Albumin 3.3 (*)    GFR calc non Af Amer 40 (*)    GFR calc Af Amer 46 (*)    All other components within normal limits  CBC WITH DIFFERENTIAL/PLATELET - Abnormal; Notable for the following components:   RBC 3.41 (*)    Hemoglobin 11.9 (*)    HCT 36.5 (*)    MCV 107.0 (*)    MCH 34.9 (*)    Platelets 145 (*)    All other components within normal limits  BRAIN  NATRIURETIC PEPTIDE - Abnormal; Notable for the following components:     B Natriuretic Peptide 395.1 (*)    All other components within normal limits  I-STAT TROPONIN, ED   Hemoglobin  Date Value Ref Range Status  11/14/2017 11.9 (L) 13.0 - 17.0 g/dL Final  08/30/2016 11.7 (L) 13.0 - 17.0 g/dL Final  03/17/2016 9.9 (L) 13.0 - 17.0 g/dL Final  03/14/2016 11.9 (L) 13.0 - 17.0 g/dL Final   BUN  Date Value Ref Range Status  11/14/2017 29 (H) 8 - 23 mg/dL Final  08/30/2016 29 (H) 6 - 20 mg/dL Final  03/17/2016 24 (H) 6 - 20 mg/dL Final  03/14/2016 35 (H) 6 - 20 mg/dL Final  01/03/2016 23 8 - 27 mg/dL Final  12/19/2015 24 8 - 27 mg/dL Final   Creat  Date Value Ref Range Status  09/15/2015 1.28 (H) 0.70 - 1.18 mg/dL Final    Comment:      For patients > or = 76 years of age: The upper reference limit for Creatinine is approximately 13% higher for people identified as African-American.     03/02/2015 1.58 (H) 0.70 - 1.18 mg/dL Final  11/22/2014 1.62 (H) 0.70 - 1.18 mg/dL Final  10/14/2014 1.39 (H) 0.70 - 1.18 mg/dL Final   Creatinine, Ser  Date Value Ref Range Status  11/14/2017 1.61 (H) 0.61 - 1.24 mg/dL Final  08/30/2016 1.51 (H) 0.61 - 1.24 mg/dL Final  03/17/2016 1.41 (H) 0.61 - 1.24 mg/dL Final  03/14/2016 1.54 (H) 0.61 - 1.24 mg/dL Final    EKG Unable to assess to due to artifact.  Radiology chest X-ray  IMPRESSION:  No active cardiopulmonary disease. Cardiomegaly with lingular and  left base atelectasis or scarring.   I have personally viewed and interpreted the imaging and agree with radiologist interpretation.  Procedures Procedures (including critical care time)  Medications Ordered in ED Medications - No data to display   Initial Impression / Assessment and Plan / ED Course  I have reviewed the triage vital signs and the nursing notes.  Pertinent labs & imaging results that were available during my care of the patient were reviewed by me and considered in my medical decision making (see chart for details).  Patient  presents with complaint of shortness of breath.  Labs:  Ordered CBC, CMP, and BNP to assess electrolytes, kidney and cardiac function. CMP reveals CKD and BNP is highly elevated.   Imaging: Ordered CXR to assess for pulmonary edema or pleural effusion. No active cardiopulmonary disease noted.   Assessment: Suspect CHF exacerbation due to history, physical exam, and lab findings.   Plan: Will admit patient to hospital due to severe shortness of breath with ambulation and fluid retention associated with CHF exacerbation. Dr. Sherry Ruffing discussed case with hospitalist.  Findings and plan of care discussed with supervising physician Dr. Sherry Ruffing who personally evaluated and examined this patient.   Clinical Course as of Nov 14 2209  Thu Nov 14, 2017  2147 BNP elevated and poor kidney function is evident consistent with CHF and CKD.  B Natriuretic Peptide(!): 395.1 [AH]    Clinical Course User Index [AH] Arville Lime, PA-C     Final Clinical Impressions(s) / ED Diagnoses   Final diagnoses:  Peripheral edema  Shortness of breath  Hypoxia  Bilateral lower extremity edema    ED Discharge Orders    None       Arville Lime, [place] 11/14/17 2246    Tegeler, Gwenyth Allegra, MD 11/15/17  1033  

## 2017-11-14 NOTE — ED Notes (Signed)
Patient transported to X-ray 

## 2017-11-14 NOTE — H&P (Signed)
History and Physical   Nathaniel Bolton EPP:[id number] DOB: 1940-12-12 DOA: 11/14/2017  Referring MD/NP/PA: Dr. Sherry Ruffing  PCP: [name], NP   Outpatient Specialists: Dr. Peter Martinique cardiology  Patient coming from: Home  Chief Complaint: Shortness of breath and cough  HPI: Nathaniel Bolton is a 77 y.o. male with medical history significant of diastolic dysfunction CHF, diabetes, chronic kidney disease stage III, coronary artery disease status post coronary artery bypass grafting, history of gout, hypertension and hyperlipidemia who apparently has been having issues with his diuretics lately.  He has been taking all his medications as prescribed.  Wife has noted progressive swelling of his feet and abdomen as well as shortness of breath.  Patient came to the ER where he was found to have oxygen sats in the 80s with mobility.  He denied any chest pain.  Denied any nausea vomiting or diarrhea.  His cough is dry at this point no sputum.  Patient also has worsening renal function with aggressive diuresis.  It appears.  He takes metolazone every other day with Lasix daily.  Patient is being admitted due to new hypoxemia indicating possible worsening CHF with exacerbation..  ED Course: Temperature is 99, blood pressure 165/72, pulse 60, respiratory of 18 and oxygen sat 84% on room air and 100% on 2 L.  White count is 6.9 hemoglobin 11.9 and platelet 145.  Sodium 140 potassium 3.6.  BUN is 29 and creatinine 1.61 glucose 168.  BNP is 395.  Chest x-ray showed no active cardiopulmonary disease mainly cardiomegaly with some atelectasis.  Patient has significant lower extremity edema.  Is being admitted with acute anemia probably due to CHF.  He has remote tobacco smoking but no COPD diagnosis.  Review of Systems: As per HPI otherwise 10 point review of systems negative.    Past Medical History:  Diagnosis Date  . CAD (coronary artery disease)   . CKD stage 3 due to type 2 diabetes mellitus ([place])   .  Diabetes mellitus    Type 2  . Erectile dysfunction   . Gout   . Hyperlipidemia   . Hypertension   . ICD (implantable cardiac defibrillator) in place   . Myocardial infarction, old    x2 in [place]. S/P CABG 2003, Dr Roxy Manns  . Obstructive lung disease (HCC)    Moderate per prior PFT's  . Paroxysmal atrial fibrillation ([place]) 3/17   discovered on event monitor  . Stroke [facility])    2013  . Systolic CHF, acute on chronic (HCC)    EF is 27% per myoview and echo October 2012  . Ventricular tachycardia ([place]) 08/2014   175 bpm    Past Surgical History:  Procedure Laterality Date  . BYPASS GRAFT     x5  . CARDIAC DEFIBRILLATOR PLACEMENT  2008   [employer] by Dr Elonda Husky at [facility]  . cataract surgery    . CHOLECYSTECTOMY    . COLONOSCOPY  12/23/2006   Small colonic polyps, status post polypectomy. Interanl hemorrhoids.   . ICD GENERATOR CHANGEOUT N/A 08/30/2016   MDT Visia AF VR ICD implanted by Dr Rayann Heman  . KNEE ARTHROSCOPY     right  . UMBILICAL HERNIA REPAIR       reports that he quit smoking about 28 years ago. He has never used smokeless tobacco. He reports that he does not drink alcohol or use drugs.  Allergies  Allergen Reactions  . Adhesive [Tape] Itching and Rash    Testosterone  patch adhesive    Family History  Problem Relation Age of Onset  . Heart failure Mother   . Heart attack Father   . Heart disease Sister        valve replaced  . Heart attack Brother        CABG, aortic grafting     Prior to Admission medications   Medication Sig Start Date End Date Taking? Authorizing Provider  allopurinol (ZYLOPRIM) 100 MG tablet Take 100 mg by mouth daily.    [provider]  amiodarone (PACERONE) 200 MG tablet Take 1 tablet (200 mg total) by mouth daily. 10/15/16   Martinique, Peter M, MD  apixaban (ELIQUIS) 5 MG TABS tablet Take 1 tablet (5 mg total) by mouth 2 (two) times daily. 10/15/16   Martinique, Peter M, MD  Ascorbic Acid (VITAMIN C) 1000 MG  tablet Take 1,000 mg by mouth daily.    [provider]  atorvastatin (LIPITOR) 20 MG tablet Take 1 tablet (20 mg total) by mouth daily. 10/15/16   Martinique, Peter M, MD  carvedilol (COREG) 6.25 MG tablet Take 1 tablet (6.25 mg total) by mouth 2 (two) times daily with a meal. 10/15/16   Martinique, Peter M, MD  cefdinir (OMNICEF) 300 MG capsule Take 300 mg by mouth 2 (two) times daily. 09/15/17   [provider]  Cholecalciferol (VITAMIN D3) 2000 units TABS Take 2,000 Units by mouth 2 (two) times daily.     [provider]  doxycycline (VIBRA-TABS) 100 MG tablet Take 100 mg by mouth 2 (two) times daily. 09/15/17   [provider]  furosemide (LASIX) 40 MG tablet Take 1 tablet (40 mg total) by mouth 2 (two) times daily. 10/15/16 10/10/17  Martinique, Peter M, MD  gabapentin (NEURONTIN) 300 MG capsule Take 300 mg by mouth 3 (three) times daily.    [facility]  glipiZIDE (GLUCOTROL) 5 MG tablet Take 5 mg by mouth daily.  07/24/15   [provider]  insulin aspart protamine- aspart (NOVOLOG MIX 70/30) (70-30) 100 UNIT/ML injection Inject 5-10 Units into the skin 3 (three) times daily as needed (IF blood sugar is <150).     [provider]  losartan (COZAAR) 50 MG tablet Take 50 mg by mouth daily.    [provider]  metolazone (ZAROXOLYN) 2.5 MG tablet TAKE 1 TABLET BY MOUTH 3 TIMES A WEEK ON MONDAYS WEDNESDAYS AND FRIDAYS 30 MINUTES BEFORE MORNING DOSE OF LASIX 06/10/17   Adegoroye, Adeyinka A, MD  NITROSTAT 0.4 MG SL tablet Place 0.4 mg under the tongue every 5 (five) minutes as needed for chest pain (MAX 3 TABLETS).  08/17/13   [provider]  NON FORMULARY Needles, syringes with needles, glucose reagent test strips    [provider]  Omega-3 Fatty Acids (FISH OIL PO) Take 2 tablets by mouth 2 (two) times daily.     [provider]  omeprazole (PRILOSEC) 40 MG capsule Take 1 capsule (40 mg total) by mouth daily. 10/15/16    Martinique, Peter M, MD  predniSONE (STERAPRED UNI-PAK 21 TAB) 5 MG (21) TBPK tablet  04/26/17   [provider]  Saw Palmetto, Serenoa repens, (SAW PALMETTO PO) Take 1 tablet by mouth daily.     [provider]  sertraline (ZOLOFT) 100 MG tablet Take 100 mg by mouth daily.    [provider]  SSD 1 % cream Apply 1 application topically 2 (two) times daily.  05/30/17   [name], NP  VENTOLIN HFA 108 (90 Base) MCG/ACT inhaler  04/26/17   [provider]  vitamin E 400 UNIT capsule Take 400 Units by mouth daily.     [provider]    Physical Exam: Vitals:   11/14/17 1915 11/14/17 1945 11/14/17 2045 11/14/17 2130  BP: 140/67 (!) 141/66  (!) 144/66  Pulse: (!) 53 (!) 43 (!) 31 (!) 42  Resp: (!) 9 14 14 13   Temp:      TempSrc:      SpO2: 100% 96% (!) 84% 99%      Constitutional: NAD, calm, comfortable Vitals:   11/14/17 1915 11/14/17 1945 11/14/17 2045 11/14/17 2130  BP: 140/67 (!) 141/66  (!) 144/66  Pulse: (!) 53 (!) 43 (!) 31 (!) 42  Resp: (!) 9 14 14 13   Temp:      TempSrc:      SpO2: 100% 96% (!) 84% 99%   Eyes: PERRL, lids and conjunctivae normal ENMT: Mucous membranes are moist. Posterior pharynx clear of any exudate or lesions.Normal dentition.  Neck: normal, supple, no masses, no thyromegaly Respiratory: Good air entry bilaterally, no wheezing, bilateral diffuse crackles. Normal respiratory effort. No accessory muscle use.  Cardiovascular: Regular rate and rhythm, no murmurs / rubs / gallops. 2+ extremity edema. 2+ pedal pulses. No carotid bruits.  Abdomen: no tenderness, no masses palpated. No hepatosplenomegaly. Bowel sounds positive.  Musculoskeletal: no clubbing / cyanosis. No joint deformity upper and lower extremities. Good ROM, no contractures. Normal muscle tone.  Skin: no rashes, lesions, ulcers. No induration Neurologic: CN 2-12 grossly intact. Sensation intact, DTR normal. Strength 5/5 in all 4.  Psychiatric: Normal  judgment and insight. Alert and oriented x 3. Normal mood.     Labs on Admission: I have personally reviewed following labs and imaging studies  CBC: Recent Labs  Lab 11/14/17 2001  WBC 6.9  NEUTROABS 3.8  HGB 11.9*  HCT 36.5*  MCV 107.0*  PLT 737*   Basic Metabolic Panel: Recent Labs  Lab 11/14/17 2001  NA 140  K 3.6  CL 104  CO2 28  GLUCOSE 168*  BUN 29*  CREATININE 1.61*  CALCIUM 9.0   GFR: CrCl cannot be calculated (Unknown ideal weight.). Liver Function Tests: Recent Labs  Lab 11/14/17 2001  AST 20  ALT 17  ALKPHOS 86  BILITOT 1.0  PROT 6.3*  ALBUMIN 3.3*   No results for input(s): LIPASE, AMYLASE in the last 168 hours. No results for input(s): AMMONIA in the last 168 hours. Coagulation Profile: No results for input(s): INR, PROTIME in the last 168 hours. Cardiac Enzymes: No results for input(s): CKTOTAL, CKMB, CKMBINDEX, TROPONINI in the last 168 hours. BNP (last 3 results) No results for input(s): PROBNP in the last 8760 hours. HbA1C: No results for input(s): HGBA1C in the last 72 hours. CBG: No results for input(s): GLUCAP in the last 168 hours. Lipid Profile: No results for input(s): CHOL, HDL, LDLCALC, TRIG, CHOLHDL, LDLDIRECT in the last 72 hours. Thyroid Function Tests: No results for input(s): TSH, T4TOTAL, FREET4, T3FREE, THYROIDAB in the last 72 hours. Anemia Panel: No results for input(s): VITAMINB12, FOLATE, FERRITIN, TIBC, IRON, RETICCTPCT in the last 72 hours. Urine analysis:    Component Value Date/Time   COLORURINE YELLOW 03/14/2016 [place] 03/14/2016 0949   LABSPEC 1.010 03/14/2016 0949   PHURINE 6.0 03/14/2016 [place] 03/14/2016 0949   HGBUR NEGATIVE 03/14/2016 [place] NEGATIVE 03/14/2016 [place] 03/14/2016  [place] 03/14/2016 0949   NITRITE NEGATIVE 03/14/2016 0949   LEUKOCYTESUR TRACE (A) 03/14/2016 0949   Sepsis  Labs: @LABRCNTIP (procalcitonin:4,lacticidven:4) )No results found for this or any previous visit (from the past 240 hour(s)).   Radiological Exams on Admission: Dg Chest 2 View  Result Date: 11/14/2017 CLINICAL DATA:  Shortness of breath EXAM: CHEST - 2 VIEW COMPARISON:  07/02/2017 FINDINGS: Post sternotomy changes. Left-sided pacing device as before. Stimulator generator over the right chest. Mild cardiomegaly with scarring or atelectasis at the lingula and left base. No pleural effusion. Aortic atherosclerosis. No pneumothorax. IMPRESSION: No active cardiopulmonary disease. Cardiomegaly with lingular and left base atelectasis or scarring. Electronically Signed   By: Donavan Foil M.D.   On: 11/14/2017 20:02    EKG: Independently reviewed.  It shows normal sinus rhythm with shortened PR interval.  Assessment/Plan Principal Problem:   Hypoxia Active Problems:   Hypertension   Ischemic cardiomyopathy   Essential hypertension   Paroxysmal atrial fibrillation (HCC)   S/P CABG (coronary artery bypass graft)   Type 2 diabetes mellitus with circulatory disorder, without long-term current use of insulin (HCC)   CHF (congestive heart failure) (HCC)   Thrombocytopenia ([place])     #1 acute hypoxemia: Most likely secondary to CHF exacerbation.  I suspect COPD and the patient was remote smoking.  Stopped smoking is in 1981.  Apparently has been seen by pulmonology with no active diagnosis.  We will diurese the patient cautiously due to his renal function.  Place him on oxygen.  Patient may well just need to be on home oxygen at the end of the day.  #2 ischemic cardiomyopathy: Secondary to coronary artery disease.  Continue home regimen.  #3 paroxysmal atrial fibrillation: On Eliquis as well as rate control.   #4 diabetes: Blood sugar appears controlled.  Continue home regimen.  #5 hypertension: Blood pressure appears also controlled.  No change in home regimen.  #6 thrombocytopenia: On  Eliquis.  Will be careful about anticoagulation.   DVT prophylaxis: Eliquis Code Status: Full Family Communication: Wife at bedside Disposition Plan: Home Consults called: None Admission status: Observation  Severity of Illness: The appropriate patient status for this patient is OBSERVATION. Observation status is judged to be reasonable and necessary in order to provide the required intensity of service to ensure the patient's safety. The patient's presenting symptoms, physical exam findings, and initial radiographic and laboratory data in the context of their medical condition is felt to place them at decreased risk for further clinical deterioration. Furthermore, it is anticipated that the patient will be medically stable for discharge from the hospital within 2 midnights of admission. The following factors support the patient status of observation.   " The patient's presenting symptoms include shortness of breath and hypoxia. " The physical exam findings include bilateral lower extremity edema with basal crackles. " The initial radiographic and laboratory data are no evidence of pulmonary edema.     Barbette Merino MD [facility] Pager [phone]  If 7PM-7AM, please contact night-coverage [email or web address] Password [facility]  11/14/2017, 10:39 PM

## 2017-11-14 NOTE — ED Notes (Signed)
Admitting provider bedside 

## 2017-11-14 NOTE — ED Notes (Signed)
[employer] called, pacemaker is working, pacing at 40.  Reports is that "there is a lot of fluid".  Informed provider

## 2017-11-14 NOTE — Telephone Encounter (Signed)
Received voice mail message from wife.  This is a patient of Dr Doug Sou. Wife reported patient has build up of fluid and requested a call back at [phone].  Patient requested a transfer to [facility] device clinic on 06/25/2017 and no longer followed in [place] device clinic. Call routed to [facility] triage regarding call back to wife.

## 2017-11-14 NOTE — ED Notes (Addendum)
Ambulated pt in hall w/ pulse ox, O2 dropped to 80% and pt became very sob.  Once back in bed O2 returned to 94% quickly.  Will inform provider

## 2017-11-14 NOTE — Telephone Encounter (Signed)
Spoke with pt and his wife. Pt was last seen by Dr.Jordan in Oct 2018. He was seen by Almyra Deforest PA in March 2019. Pt was suppose to f/u in 6 months. He is no longer being followed by our device clinic. He is now being followed by a Cardiologist at the [place]. Pt wife sts that the pt was last seen at the [place] 3 weeks ago. His Lasix was increased to 160mg  daily. The pt baseline weight is normally 182. The pt has had a 23lb weight increase in 3 weeks. Pt weight this morning 205lb. Pt has increased generalized edema, he reports increased LE edema, swelling in his hands, his abdomen is extended and tight. The pt is also having increased sob. Adv pt that that he should not wait for an office visit which would probably be next week. Adv pt wife that the pt needs to go to the ED for eval asap, she sts that the pt refuses to go to the hospital. Spoke with the pt directly, adv him that my recommendation is that he goes to the ED asap for eval. Adv pt that he probably needs IV diuretics, he is taking a large dose of oral diuretics with no improvement of fluid status, he is swollen and sob. Pt agreeable and sts that he will got to the ED as instructed. Pt wife sts that she will take him.

## 2017-11-15 ENCOUNTER — Observation Stay (HOSPITAL_COMMUNITY): Payer: Medicare HMO

## 2017-11-15 ENCOUNTER — Encounter (HOSPITAL_COMMUNITY): Payer: Self-pay | Admitting: Internal Medicine

## 2017-11-15 DIAGNOSIS — I480 Paroxysmal atrial fibrillation: Secondary | ICD-10-CM

## 2017-11-15 DIAGNOSIS — Z7984 Long term (current) use of oral hypoglycemic drugs: Secondary | ICD-10-CM | POA: Diagnosis not present

## 2017-11-15 DIAGNOSIS — J9601 Acute respiratory failure with hypoxia: Secondary | ICD-10-CM | POA: Diagnosis present

## 2017-11-15 DIAGNOSIS — I5023 Acute on chronic systolic (congestive) heart failure: Secondary | ICD-10-CM | POA: Diagnosis not present

## 2017-11-15 DIAGNOSIS — E1122 Type 2 diabetes mellitus with diabetic chronic kidney disease: Secondary | ICD-10-CM | POA: Diagnosis present

## 2017-11-15 DIAGNOSIS — I472 Ventricular tachycardia: Secondary | ICD-10-CM | POA: Diagnosis present

## 2017-11-15 DIAGNOSIS — I255 Ischemic cardiomyopathy: Secondary | ICD-10-CM | POA: Diagnosis present

## 2017-11-15 DIAGNOSIS — Z9049 Acquired absence of other specified parts of digestive tract: Secondary | ICD-10-CM | POA: Diagnosis not present

## 2017-11-15 DIAGNOSIS — R600 Localized edema: Secondary | ICD-10-CM | POA: Diagnosis not present

## 2017-11-15 DIAGNOSIS — J449 Chronic obstructive pulmonary disease, unspecified: Secondary | ICD-10-CM | POA: Diagnosis present

## 2017-11-15 DIAGNOSIS — E782 Mixed hyperlipidemia: Secondary | ICD-10-CM | POA: Diagnosis present

## 2017-11-15 DIAGNOSIS — Z87891 Personal history of nicotine dependence: Secondary | ICD-10-CM | POA: Diagnosis not present

## 2017-11-15 DIAGNOSIS — M109 Gout, unspecified: Secondary | ICD-10-CM | POA: Diagnosis present

## 2017-11-15 DIAGNOSIS — I340 Nonrheumatic mitral (valve) insufficiency: Secondary | ICD-10-CM | POA: Diagnosis not present

## 2017-11-15 DIAGNOSIS — I252 Old myocardial infarction: Secondary | ICD-10-CM | POA: Diagnosis not present

## 2017-11-15 DIAGNOSIS — I10 Essential (primary) hypertension: Secondary | ICD-10-CM | POA: Diagnosis not present

## 2017-11-15 DIAGNOSIS — Z95810 Presence of automatic (implantable) cardiac defibrillator: Secondary | ICD-10-CM | POA: Diagnosis not present

## 2017-11-15 DIAGNOSIS — Z8249 Family history of ischemic heart disease and other diseases of the circulatory system: Secondary | ICD-10-CM | POA: Diagnosis not present

## 2017-11-15 DIAGNOSIS — Z951 Presence of aortocoronary bypass graft: Secondary | ICD-10-CM | POA: Diagnosis not present

## 2017-11-15 DIAGNOSIS — I5043 Acute on chronic combined systolic (congestive) and diastolic (congestive) heart failure: Secondary | ICD-10-CM | POA: Diagnosis present

## 2017-11-15 DIAGNOSIS — I130 Hypertensive heart and chronic kidney disease with heart failure and stage 1 through stage 4 chronic kidney disease, or unspecified chronic kidney disease: Secondary | ICD-10-CM | POA: Diagnosis present

## 2017-11-15 DIAGNOSIS — E1142 Type 2 diabetes mellitus with diabetic polyneuropathy: Secondary | ICD-10-CM | POA: Diagnosis present

## 2017-11-15 DIAGNOSIS — E785 Hyperlipidemia, unspecified: Secondary | ICD-10-CM | POA: Diagnosis present

## 2017-11-15 DIAGNOSIS — Z8673 Personal history of transient ischemic attack (TIA), and cerebral infarction without residual deficits: Secondary | ICD-10-CM | POA: Diagnosis not present

## 2017-11-15 DIAGNOSIS — N183 Chronic kidney disease, stage 3 (moderate): Secondary | ICD-10-CM | POA: Diagnosis present

## 2017-11-15 DIAGNOSIS — Z7901 Long term (current) use of anticoagulants: Secondary | ICD-10-CM | POA: Diagnosis not present

## 2017-11-15 DIAGNOSIS — R0602 Shortness of breath: Secondary | ICD-10-CM | POA: Diagnosis present

## 2017-11-15 DIAGNOSIS — I2510 Atherosclerotic heart disease of native coronary artery without angina pectoris: Secondary | ICD-10-CM | POA: Diagnosis present

## 2017-11-15 DIAGNOSIS — Z8719 Personal history of other diseases of the digestive system: Secondary | ICD-10-CM | POA: Diagnosis not present

## 2017-11-15 LAB — COMPREHENSIVE METABOLIC PANEL
ALK PHOS: 78 U/L (ref 38–126)
ALT: 16 U/L (ref 0–44)
ANION GAP: 7 (ref 5–15)
AST: 19 U/L (ref 15–41)
Albumin: 3.1 g/dL — ABNORMAL LOW (ref 3.5–5.0)
BUN: 29 mg/dL — ABNORMAL HIGH (ref 8–23)
CALCIUM: 9 mg/dL (ref 8.9–10.3)
CHLORIDE: 105 mmol/L (ref 98–111)
CO2: 31 mmol/L (ref 22–32)
Creatinine, Ser: 1.6 mg/dL — ABNORMAL HIGH (ref 0.61–1.24)
GFR calc non Af Amer: 40 mL/min — ABNORMAL LOW (ref >60)
GFR, EST AFRICAN AMERICAN: 47 mL/min — AB (ref >60)
Glucose, Bld: 122 mg/dL — ABNORMAL HIGH (ref 70–99)
POTASSIUM: 3.6 mmol/L (ref 3.5–5.1)
SODIUM: 143 mmol/L (ref 135–145)
Total Bilirubin: 1 mg/dL (ref 0.3–1.2)
Total Protein: 6 g/dL — ABNORMAL LOW (ref 6.5–8.1)

## 2017-11-15 LAB — ECHOCARDIOGRAM COMPLETE
Height: 70 in
Weight: 3257.6 oz

## 2017-11-15 LAB — TROPONIN I
TROPONIN I: 0.03 ng/mL — AB (ref <0.03)
TROPONIN I: 0.04 ng/mL — AB (ref <0.03)
Troponin I: 0.03 ng/mL (ref <0.03)

## 2017-11-15 LAB — GLUCOSE, CAPILLARY
GLUCOSE-CAPILLARY: 240 mg/dL — AB (ref 70–99)
GLUCOSE-CAPILLARY: 234 mg/dL — AB (ref 70–99)
Glucose-Capillary: 125 mg/dL — ABNORMAL HIGH (ref 70–99)
Glucose-Capillary: 244 mg/dL — ABNORMAL HIGH (ref 70–99)

## 2017-11-15 LAB — CBC
HCT: 36.5 % — ABNORMAL LOW (ref 39.0–52.0)
HEMOGLOBIN: 11.6 g/dL — AB (ref 13.0–17.0)
MCH: 34 pg (ref 26.0–34.0)
MCHC: 31.8 g/dL (ref 30.0–36.0)
MCV: 107 fL — ABNORMAL HIGH (ref 80.0–100.0)
NRBC: 0 % (ref 0.0–0.2)
PLATELETS: 165 10*3/uL (ref 150–400)
RBC: 3.41 MIL/uL — AB (ref 4.22–5.81)
RDW: 12.6 % (ref 11.5–15.5)
WBC: 6.9 10*3/uL (ref 4.0–10.5)

## 2017-11-15 LAB — TSH: TSH: 1.935 u[IU]/mL (ref 0.350–4.500)

## 2017-11-15 LAB — HEMOGLOBIN A1C
Hgb A1c MFr Bld: 8.7 % — ABNORMAL HIGH (ref 4.8–5.6)
Mean Plasma Glucose: 202.99 mg/dL

## 2017-11-15 MED ORDER — CARVEDILOL 3.125 MG PO TABS
3.1250 mg | ORAL_TABLET | Freq: Two times a day (BID) | ORAL | Status: DC
  Administered 2017-11-15 – 2017-11-18 (×6): 3.125 mg via ORAL
  Filled 2017-11-15 (×6): qty 1

## 2017-11-15 MED ORDER — FUROSEMIDE 10 MG/ML IJ SOLN
60.0000 mg | Freq: Two times a day (BID) | INTRAMUSCULAR | Status: DC
  Filled 2017-11-15: qty 6

## 2017-11-15 MED ORDER — FUROSEMIDE 10 MG/ML IJ SOLN
60.0000 mg | Freq: Two times a day (BID) | INTRAMUSCULAR | Status: DC
  Administered 2017-11-15 – 2017-11-16 (×2): 60 mg via INTRAVENOUS
  Filled 2017-11-15 (×2): qty 6

## 2017-11-15 MED ORDER — POTASSIUM CHLORIDE CRYS ER 20 MEQ PO TBCR
40.0000 meq | EXTENDED_RELEASE_TABLET | Freq: Once | ORAL | Status: AC
  Administered 2017-11-15: 40 meq via ORAL
  Filled 2017-11-15: qty 2

## 2017-11-15 MED ORDER — PERFLUTREN LIPID MICROSPHERE
1.0000 mL | INTRAVENOUS | Status: AC | PRN
  Administered 2017-11-15: 2 mL via INTRAVENOUS
  Filled 2017-11-15: qty 10

## 2017-11-15 NOTE — Progress Notes (Signed)
RT set up patient CPAP at beside. Patient is able to place mask on when he is ready.

## 2017-11-15 NOTE — Progress Notes (Signed)
[facility] PROGRESS NOTE  JOJUAN CHAMPNEY JGO:[id number] DOB: 1940/11/22 DOA: 11/14/2017 PCP: [name], NP  Assessment/Plan: #1. Acute respiratory failure with hypoxemia: Most likely secondary to CHF exacerbation in setting of  dietary indiscrestion and possible underdiuresing. Oxygen saturation level improving on room air. Volume status -1.4L after lasix IV. Improved respiratory effort. Continues with edema from ankles to abdomen -continue oxygen supplemantation -wean oxygen as able -may need to be on home oxygen. Will evaluate once initial episode resolved -monitor oxygen saturation level -of note is former smoker (quit in 80's). Has seen pulmonologist and not diagnosed with copd  #2  Acute on chronic systolic heart failure/ischemic cardiomyopathy: Secondary to coronary artery disease. S/p ICD. BNP 395. Chest xray with no cardiopulmonary process. Troponin slightly elevated but flat. ekg TTE 04/18/16 from [place] LVEF 25-30%. Device interogated  Quite edematous on exam.  Home meds include lasix and zaroxolyn. Reports compliance with meds.  -will increase IV lasix to 60mg  -continue zaroxolyn daily (vs QOD at home) -continue amiodarone -monitor intake and output -monitor daily weights -obtain echo  #3 paroxysmal atrial fibrillation: On Eliquis as well as rate control. HR range 44-60. EKG with BBB.  -continue BB with parameters -continue eliquis -monitor  #4 diabetes: A1c 8.7. Home regimen includes glipizide. - Will hold oral agents for now. - Use SSI for optimal control.  #5 hypertension: fair control. Home meds include coreg, lasix, losartan -lasix as above -coreg with parameters -continue losartan  #6 thrombocytopenia: On Eliquis.  Will be careful about anticoagulation  #7. CKD. Creatinine 1.6. This level appears to be slightly above baseline.  -see #2 -bmet in am   Code Status: full Family Communication: wife at bedside Disposition Plan: home in 24-36  hours   Consultants:  none  Procedures:    Antibiotics:  none  HPI/Subjective: JAQUARIUS SEDER is a 77 y.o. male with a PMH of systolic CHF presented on 11/7 with constant shortness of breath onset today. Patient reported bilateral leg edema and abdominal distention for the previous 3 weeks. Patient reported shortness of breath  worse with exertion and alleviated with rest. Patient stated Lasix  increased by Dr. Martinique without relief. Patient denied chest pain, palpitations, abdominal pain, vomiting, cough, fever, night sweats, or chills.  This am he reports feeling "better" and breathing better but complains that swelling of legs and abdomen remain unchanges. Continues to deny pain/discomfort  Objective: Vitals:   11/14/17 2327 11/15/17 0500  BP:  (!) 133/59  Pulse:  (!) 50  Resp:  18  Temp: 98.5 F (36.9 C) (!) 97.5 F (36.4 C)  SpO2:  97%    Intake/Output Summary (Last 24 hours) at 11/15/2017 1011 Last data filed at 11/15/2017 0500 Gross per 24 hour  Intake 240 ml  Output 1675 ml  Net -1435 ml   Filed Weights   11/15/17 0500  Weight: 92.4 kg    Exam:   General:  Sitting up in bed eating in no acute distress  Cardiovascular: irregularly irregular no mgr 2+ LE edema bilaterally abdomen edemetous  Respiratory: mild increased work of breathing with conversation. BS distant with faint crackles no wheeze no rhonchi  Abdomen: quite distended and firm +BS non-tender to palpation  Musculoskeletal: joints without swelling/erythema   Data Reviewed: Basic Metabolic Panel: Recent Labs  Lab 11/14/17 2001 11/15/17 0527  NA 140 143  K 3.6 3.6  CL 104 105  CO2 28 31  GLUCOSE 168* 122*  BUN 29* 29*  CREATININE 1.61* 1.60*  CALCIUM 9.0 9.0   Liver Function Tests: Recent Labs  Lab 11/14/17 2001 11/15/17 0527  AST 20 19  ALT 17 16  ALKPHOS 86 78  BILITOT 1.0 1.0  PROT 6.3* 6.0*  ALBUMIN 3.3* 3.1*   No results for input(s): LIPASE, AMYLASE in the last  168 hours. No results for input(s): AMMONIA in the last 168 hours. CBC: Recent Labs  Lab 11/14/17 2001 11/15/17 0527  WBC 6.9 6.9  NEUTROABS 3.8  --   HGB 11.9* 11.6*  HCT 36.5* 36.5*  MCV 107.0* 107.0*  PLT 145* 165   Cardiac Enzymes: Recent Labs  Lab 11/14/17 2355 11/15/17 0527  TROPONINI 0.04* 0.03*   BNP (last 3 results) Recent Labs    11/14/17 2003  BNP 395.1*    ProBNP (last 3 results) No results for input(s): PROBNP in the last 8760 hours.  CBG: Recent Labs  Lab 11/14/17 2350 11/15/17 0758  GLUCAP 132* 125*    No results found for this or any previous visit (from the past 240 hour(s)).   Studies: Dg Chest 2 View  Result Date: 11/14/2017 CLINICAL DATA:  Shortness of breath EXAM: CHEST - 2 VIEW COMPARISON:  07/02/2017 FINDINGS: Post sternotomy changes. Left-sided pacing device as before. Stimulator generator over the right chest. Mild cardiomegaly with scarring or atelectasis at the lingula and left base. No pleural effusion. Aortic atherosclerosis. No pneumothorax. IMPRESSION: No active cardiopulmonary disease. Cardiomegaly with lingular and left base atelectasis or scarring. Electronically Signed   By: Donavan Foil M.D.   On: 11/14/2017 20:02    Scheduled Meds: . allopurinol  100 mg Oral Daily  . amiodarone  200 mg Oral Daily  . apixaban  5 mg Oral BID  . atorvastatin  20 mg Oral Daily  . carvedilol  6.25 mg Oral BID WC  . cholecalciferol  2,000 Units Oral BID WC  . furosemide  60 mg Intravenous BID  . gabapentin  300 mg Oral TID  . insulin aspart  0-5 Units Subcutaneous QHS  . insulin aspart  0-9 Units Subcutaneous TID WC  . losartan  50 mg Oral Daily  . metolazone  5 mg Oral Daily  . omega-3 acid ethyl esters  1 g Oral Daily  . pantoprazole  40 mg Oral Daily  . sertraline  100 mg Oral Daily  . silver sulfADIAZINE  1 application Topical BID  . vitamin C  1,000 mg Oral Daily  . vitamin E  400 Units Oral Daily   Continuous  Infusions:  Principal Problem:   Acute respiratory failure with hypoxia (HCC) Active Problems:   Acute on chronic systolic HF (heart failure) (HCC)   Paroxysmal atrial fibrillation (HCC)   Ischemic cardiomyopathy   CKD (chronic kidney disease)   Essential hypertension   Type 2 diabetes mellitus with circulatory disorder, without long-term current use of insulin (HCC)   Automatic implantable cardioverter-defibrillator in situ   S/P CABG (coronary artery bypass graft)   Thrombocytopenia ([place])    Time spent: 40 minutes    [place] NP  [facility]  If 7PM-7AM, please contact night-coverage at [email or web address], password [facility] 11/15/2017, 10:11 AM  LOS: 1 day

## 2017-11-15 NOTE — Progress Notes (Signed)
Patient refused to take  Mtolazone 5mg  and patient's wife states that patient does not need silver sulfADIAZINE 1% cream. MD notified.

## 2017-11-15 NOTE — Progress Notes (Signed)
I responded to a [place] to provide Advanced Directive information for the patient. I visited with the patient with his wife present. I provided an overview of the AD and answered their questions. I remained present as the patient completed the patient sections of the document. I shared that the last section could not be completed without the Notary and witnesses present. Awaiting Notary, when available. I shared that the Chaplain is available for additional support as needed or requested.    11/15/17 0915  Clinical Encounter Type  Visited With Patient and family together  Visit Type Spiritual support  Referral From Nurse  Consult/Referral To Chaplain  Spiritual Encounters  Spiritual Needs Literature;Prayer  Stress Factors  Patient Stress Factors None identified  Family Stress Factors None identified    Chaplain Dr Redgie Grayer

## 2017-11-15 NOTE — Evaluation (Signed)
Physical Therapy Evaluation Patient Details Name: Nathaniel Bolton MRN: [id number] DOB: 1940/03/26 Today's Date: 11/15/2017   History of Present Illness  Pt is a 77 y.o. male with medical history significant of diastolic dysfunction CHF, diabetes, chronic kidney disease stage III, coronary artery disease status post coronary artery bypass grafting, history of gout, hypertension and hyperlipidemia. He presented to the ED with c/o SOB. He was admitted for acute respiratory failure related to CHF exacerbation.     Clinical Impression  Pt admitted with above diagnosis. Pt currently with functional limitations due to the deficits listed below (see PT Problem List). On eval, pt required supervision transfers and min guard assist ambulation 350 feet without AD. Desat to 91% ambulating on RA. No SOB noted. Pt will benefit from skilled PT to increase their independence and safety with mobility to allow discharge to the venue listed below.  PT to follow acutely. No follow up services indicated. Pt would benefit from ambulating with mobility tech.     Follow Up Recommendations No PT follow up;Supervision - Intermittent    Equipment Recommendations  None recommended by PT    Recommendations for Other Services       Precautions / Restrictions Precautions Precautions: Other (comment);Fall Precaution Comments: watch sats      Mobility  Bed Mobility Overal bed mobility: Modified Independent                Transfers Overall transfer level: Needs assistance Equipment used: None Transfers: Sit to/from Stand;Stand Pivot Transfers Sit to Stand: Supervision Stand pivot transfers: Supervision       General transfer comment: supervision for safety  Ambulation/Gait Ambulation/Gait assistance: Min guard Gait Distance (Feet): 350 Feet Assistive device: None Gait Pattern/deviations: Step-through pattern;Decreased stride length Gait velocity: WFL Gait velocity interpretation: >2.62 ft/sec,  indicative of community ambulatory General Gait Details: SpO2 92% at rest on RA. Desat to 91% with ambulation on RA  Stairs            Wheelchair Mobility    Modified Rankin (Stroke Patients Only)       Balance Overall balance assessment: Mild deficits observed, not formally tested                                           Pertinent Vitals/Pain Pain Assessment: No/denies pain    Home Living Family/patient expects to be discharged to:: Private residence Living Arrangements: Spouse/significant other Available Help at Discharge: Family;Available 24 hours/day Type of Home: House Home Access: Level entry     Home Layout: One level Home Equipment: Cane - single point;Bedside commode      Prior Function Level of Independence: Independent with assistive device(s)         Comments: cane for ambulation household distances     Hand Dominance        Extremity/Trunk Assessment   Upper Extremity Assessment Upper Extremity Assessment: Overall WFL for tasks assessed    Lower Extremity Assessment Lower Extremity Assessment: Overall WFL for tasks assessed    Cervical / Trunk Assessment Cervical / Trunk Assessment: Normal  Communication   Communication: No difficulties  Cognition Arousal/Alertness: Awake/alert Behavior During Therapy: WFL for tasks assessed/performed Overall Cognitive Status: Within Functional Limits for tasks assessed  General Comments      Exercises     Assessment/Plan    PT Assessment Patient needs continued PT services  PT Problem List Decreased mobility;Decreased activity tolerance;Cardiopulmonary status limiting activity       PT Treatment Interventions Therapeutic activities;Gait training;Therapeutic exercise;Patient/family education;Balance training;Stair training;Functional mobility training    PT Goals (Current goals can be found in the Care Plan  section)  Acute Rehab PT Goals Patient Stated Goal: home, independence PT Goal Formulation: With patient/family Time For Goal Achievement: 11/29/17 Potential to Achieve Goals: Good    Frequency Min 3X/week   Barriers to discharge        Co-evaluation               AM-PAC PT "6 Clicks" Daily Activity  Outcome Measure Difficulty turning over in bed (including adjusting bedclothes, sheets and blankets)?: None Difficulty moving from lying on back to sitting on the side of the bed? : None Difficulty sitting down on and standing up from a chair with arms (e.g., wheelchair, bedside commode, etc,.)?: None Help needed moving to and from a bed to chair (including a wheelchair)?: None Help needed walking in hospital room?: A Little Help needed climbing 3-5 steps with a railing? : A Little 6 Click Score: 22    End of Session Equipment Utilized During Treatment: Gait belt Activity Tolerance: Patient tolerated treatment well Patient left: in chair;with call bell/phone within reach;with family/visitor present Nurse Communication: Mobility status PT Visit Diagnosis: Difficulty in walking, not elsewhere classified (R26.2)    Time: 8251-8984 PT Time Calculation (min) (ACUTE ONLY): 24 min   Charges:   PT Evaluation $PT Eval Low Complexity: 1 Low PT Treatments $Gait Training: 8-22 mins        Lorrin Goodell, PT  Office # [phone] Pager [phone]   Lorriane Shire 11/15/2017, 12:42 PM

## 2017-11-15 NOTE — Progress Notes (Signed)
CCMD notified that pt had 9 beat run of vtach. Pt asymptomatic talking with this nurse with no complaints of chest pain or SOB. Provider notified.

## 2017-11-15 NOTE — Progress Notes (Signed)
CRITICAL VALUE ALERT  Critical Value:  0.04   Date & Time Notied:  11/15/17 111   Provider Notified:  Kennon Holter  Orders Received/Actions taken: No new orders given.

## 2017-11-15 NOTE — Progress Notes (Signed)
SATURATION QUALIFICATIONS: (This note is used to comply with regulatory documentation for home oxygen)  Patient Saturations on Room Air at Rest = 92%  Patient Saturations on Room Air while Ambulating = 91%  Patient Saturations on 2 Liters of oxygen while Ambulating = NT  Please briefly explain why patient needs home oxygen:  At this time, pt able to maintain his SpO2 >90% on RA.   Lorrin Goodell, PT  Office # [phone] Pager [phone]

## 2017-11-15 NOTE — Telephone Encounter (Signed)
Agree, thanks  Peter Martinique MD, [facility]

## 2017-11-15 NOTE — Plan of Care (Signed)

## 2017-11-15 NOTE — Progress Notes (Signed)
  Echocardiogram 2D Echocardiogram has been performed.  Tanette Chauca L Androw 11/15/2017, 11:01 AM

## 2017-11-15 NOTE — Discharge Instructions (Addendum)
Acute Respiratory Distress Syndrome, Adult Acute respiratory distress syndrome is a life-threatening condition in which fluid collects in the lungs. This prevents the lungs from filling with air and passing oxygen into the blood. This can cause the lungs and other vital organs to fail. The condition usually develops following an infection, illness, surgery, or injury. What are the causes? This condition may be caused by:  An infection, such as sepsis or pneumonia.  A serious injury to the head or chest.  Severe bleeding from an injury.  A major surgery.  Breathing in harmful chemicals or smoke.  Blood transfusions.  A blood clot in the lungs.  Breathing in vomit (aspiration).  Near-drowning.  Inflammation of the pancreas (pancreatitis).  A drug overdose.  What are the signs or symptoms? Sudden shortness of breath and rapid breathing are the main symptoms of this condition. Other symptoms may include:  A fast or irregular heartbeat.  Skin, lips, or fingernails that look blue (cyanosis).  Confusion.  Tiredness or loss of energy.  Chest pain, particularly while taking a breath.  Coughing.  Restlessness or anxiety.  Fever. This is usually present if there is an underlying infection, such as pneumonia.  How is this diagnosed? This condition is diagnosed based on:  Your symptoms.  Medical history.  A physical exam. During the exam, your health care provider will listen to your heart and check for crackling or wheezing sounds in your lungs.  You may also have other tests to confirm the diagnosis and measure how well your lungs are working. These may include:  Measuring the amount of oxygen in your blood. Your health care provider will use two methods to do this procedure: ? A small device (pulse oximeter) that is placed on your finger, earlobe, or toe. ? An arterial blood gas test. A sample of blood is taken from an artery and tested for oxygen levels.  Blood  tests.  Chest X-rays or CT scans to look for fluid in the lungs.  Taking a sample of your sputum to test for infection.  Heart test, such as an echocardiogram or electrocardiogram. This is done to rule out any heart problems (such as heart failure) that may be causing your symptoms.  Bronchoscopy. During this test, a thin, flexible tube with a light is passed into the mouth or nose, down the windpipe, and into the lungs.  How is this treated? Treatment depends on the cause of your condition. The goal is to support you while your lungs heal and the underlying cause is treated. Treatment may include:  Oxygen therapy. This may be done through: ? A tube in your nose or a face mask. ? A ventilator. This device helps move air into and out of your lungs through a breathing tube that is inserted into your mouth or nose.  Continuous positive airway pressure (CPAP). This treatment uses mild air pressure to keep the airways open. A mask or other device will be placed over your nose or mouth.  Tracheostomy. During this procedure, a small cut is made in your neck to create an opening to your windpipe. A breathing tube is placed directly into your windpipe. The breathing tube is connected to a ventilator. This is done if you have problems with your airway or if you need a ventilator for a long period of time.  Positioning you to lie on your stomach (prone position).  Medicines, such as: ? Sedatives to help you relax. ? Blood pressure medicines. ? Antibiotics to  treat infection. ? Blood thinners to prevent blood clots. ? Diuretics to help prevent excess fluid.  Fluids and nutrients given through an IV tube.  Wearing compression stockings on your legs to prevent blood clots.  Extra corporeal membrane oxygenation (ECMO). This treatment takes blood outside your body, adds oxygen, and removes carbon dioxide. The blood is then returned to your body. This treatment is only used in severe  cases.  Follow these instructions at home:  Take over-the-counter and prescription medicines only as told by your health care provider.  Do not use any products that contain nicotine or tobacco, such as cigarettes and e-cigarettes. If you need help quitting, ask your health care provider.  Limit alcohol intake to no more than 1 drink per day for nonpregnant women and 2 drinks per day for men. One drink equals 12 oz of beer, 5 oz of wine, or 1 oz of hard liquor.  Ask friends and family to help you if daily activities make you tired.  Attend any pulmonary rehabilitation as told by your health care provider. This may include: ? Education about your condition. ? Exercises. ? Breathing training. ? Counseling. ? Learning techniques to conserve energy. ? Nutrition counseling.  Keep all follow-up visits as told by your health care provider. This is important. Contact a health care provider if:  You become short of breath during activity or while resting.  You develop a cough that does not go away.  You have a fever.  Your symptoms do not get better or they get worse.  You become anxious or depressed. Get help right away if:  You have sudden shortness of breath.  You develop sudden chest pain that does not go away.  You develop a rapid heart rate.  You develop swelling or pain in one of your legs.  You cough up blood.  You have trouble breathing.  Your skin, lips, or fingernails turn blue. These symptoms may represent a serious problem that is an emergency. Do not wait to see if the symptoms will go away. Get medical help right away. Call your local emergency services (911 in the [place] Do not drive yourself to the hospital. Summary  Acute respiratory distress syndrome is a life-threatening condition in which fluid collects in the lungs, which leads the lungs and other vital organs to fail.  This condition usually develops following an infection, illness, surgery, or  injury.  Sudden shortness of breath and rapid breathing are the main symptoms of acute respiratory distress syndrome.  Treatment may include oxygen therapy, continuous positive airway pressure (CPAP), tracheostomy, lying on your stomach (prone position), medicines, fluids and nutrients given through an IV tube, compression stockings, and extra corporeal membrane oxygenation (ECMO). This information is not intended to replace advice given to you by your health care provider. Make sure you discuss any questions you have with your health care provider. Document Released: 12/25/2004 Document Revised: 12/12/2015 Document Reviewed: 12/12/2015 Elsevier Interactive Patient Education  2017 [employer] 1)Very low-salt diet advised 2)Weigh yourself daily, call if you gain more than 3 pounds in 1 day or more than 5 pounds in 1 week as your diuretic medications may need to be adjusted 3)Limit your Fluid  intake to no more than 60 ounces (1.8 Liters) per day 4)TAKE 1 TABLET of Metolazone/Zaroxylyn BY MOUTH 2 TIMES A WEEK ON Tuesday  AND Saturday 30 MINUTES BEFORE MORNING DOSE OF LASIX 5) you are already taking the blood thinner Eliquis so Avoid ibuprofen/Advil/Aleve/Motrin/Goody Powders/Naproxen/BC powders/Meloxicam/Diclofenac/Indomethacin and other  Nonsteroidal anti-inflammatory medications as these will make you more likely to bleed and can cause stomach ulcers, can also cause Kidney problems.  6)Please make appointment with Dr. Marigene Ehlers at Heart Failure clinic prior to discharge 7) start Lantus insulin 10 units every evening.Marland Kitchen 8) use NovoLog insulin 3 times a day with meals 9)Stop Glipizide 10)insulin aspart (novoLOG) injection  -A)inject 3 units of Novolog insulin with meal if blood sugar is over 151 before meals B)inject 2 units of Novolog with meal if blood sugar is above 110, but less than 150 before meals C)Do  not inject any Novolog insulin if your blood sugar is less than 110 before meals 11) you  need repeat BMP/kidney and electrolyte test within a week of discharge 12)Wound Care--- Rt Foot Dressing procedure/placement/frequency: Continue present plan of care patient was using prior to admission: dry 2X2 tucked into the space to promote healing and absorb moisture  Information on my medicine - ELIQUIS (apixaban)  Why was Eliquis prescribed for you? Eliquis was prescribed for you to reduce the risk of a blood clot forming that can cause a stroke if you have a medical condition called atrial fibrillation (a type of irregular heartbeat).  What do You need to know about Eliquis ? Take your Eliquis TWICE DAILY - one tablet in the morning and one tablet in the evening with or without food. If you have difficulty swallowing the tablet whole please discuss with your pharmacist how to take the medication safely.  Take Eliquis exactly as prescribed by your doctor and DO NOT stop taking Eliquis without talking to the doctor who prescribed the medication.  Stopping may increase your risk of developing a stroke.  Refill your prescription before you run out.  After discharge, you should have regular check-up appointments with your healthcare provider that is prescribing your Eliquis.  In the future your dose may need to be changed if your kidney function or weight changes by a significant amount or as you get older.  What do you do if you miss a dose? If you miss a dose, take it as soon as you remember on the same day and resume taking twice daily.  Do not take more than one dose of ELIQUIS at the same time to make up a missed dose.  Important Safety Information A possible side effect of Eliquis is bleeding. You should call your healthcare provider right away if you experience any of the following: ? Bleeding from an injury or your nose that does not stop. ? Unusual colored urine (red or dark brown) or unusual colored stools (red or black). ? Unusual bruising for unknown reasons. ? A  serious fall or if you hit your head (even if there is no bleeding).  Some medicines may interact with Eliquis and might increase your risk of bleeding or clotting while on Eliquis. To help avoid this, consult your healthcare provider or pharmacist prior to using any new prescription or non-prescription medications, including herbals, vitamins, non-steroidal anti-inflammatory drugs (NSAIDs) and supplements.  This website has more information on Eliquis (apixaban): http://www.eliquis.com/eliquis/home

## 2017-11-16 DIAGNOSIS — J9601 Acute respiratory failure with hypoxia: Secondary | ICD-10-CM

## 2017-11-16 LAB — CBC
HEMATOCRIT: 35.4 % — AB (ref 39.0–52.0)
Hemoglobin: 11.6 g/dL — ABNORMAL LOW (ref 13.0–17.0)
MCH: 34.9 pg — ABNORMAL HIGH (ref 26.0–34.0)
MCHC: 32.8 g/dL (ref 30.0–36.0)
MCV: 106.6 fL — AB (ref 80.0–100.0)
NRBC: 0 % (ref 0.0–0.2)
PLATELETS: 154 10*3/uL (ref 150–400)
RBC: 3.32 MIL/uL — AB (ref 4.22–5.81)
RDW: 12.7 % (ref 11.5–15.5)
WBC: 8.7 10*3/uL (ref 4.0–10.5)

## 2017-11-16 LAB — BASIC METABOLIC PANEL
ANION GAP: 9 (ref 5–15)
BUN: 25 mg/dL — ABNORMAL HIGH (ref 8–23)
CALCIUM: 8.4 mg/dL — AB (ref 8.9–10.3)
CHLORIDE: 104 mmol/L (ref 98–111)
CO2: 27 mmol/L (ref 22–32)
CREATININE: 1.65 mg/dL — AB (ref 0.61–1.24)
GFR calc non Af Amer: 39 mL/min — ABNORMAL LOW (ref >60)
GFR, EST AFRICAN AMERICAN: 45 mL/min — AB (ref >60)
Glucose, Bld: 148 mg/dL — ABNORMAL HIGH (ref 70–99)
Potassium: 3.5 mmol/L (ref 3.5–5.1)
SODIUM: 140 mmol/L (ref 135–145)

## 2017-11-16 LAB — GLUCOSE, CAPILLARY
GLUCOSE-CAPILLARY: 217 mg/dL — AB (ref 70–99)
Glucose-Capillary: 132 mg/dL — ABNORMAL HIGH (ref 70–99)
Glucose-Capillary: 285 mg/dL — ABNORMAL HIGH (ref 70–99)
Glucose-Capillary: 230 mg/dL — ABNORMAL HIGH (ref 70–99)

## 2017-11-16 MED ORDER — FUROSEMIDE 10 MG/ML IJ SOLN
80.0000 mg | Freq: Two times a day (BID) | INTRAMUSCULAR | Status: DC
  Administered 2017-11-16 – 2017-11-17 (×2): 80 mg via INTRAVENOUS
  Filled 2017-11-16 (×2): qty 8

## 2017-11-16 MED ORDER — VITAMIN C 500 MG PO TABS
1000.0000 mg | ORAL_TABLET | Freq: Every day | ORAL | Status: DC
  Administered 2017-11-16 – 2017-11-18 (×3): 1000 mg via ORAL
  Filled 2017-11-16 (×3): qty 2

## 2017-11-16 NOTE — Progress Notes (Signed)
Patient and family member refused to Metolazone 5mg . Family member stated :" why they want to give him that medicine, he doesn't take that at home.". MD notified.

## 2017-11-16 NOTE — Progress Notes (Signed)
Patient Demographics:    Nathaniel Bolton, is a 77 y.o. male, DOB - Jul 27, 1940, TDH:[id number]  Admit date - 11/14/2017   Admitting Physician Elwyn Reach, MD  Outpatient Primary MD for the patient is Nathaniel Dandy, NP  LOS - 2   Chief Complaint  Patient presents with  . Shortness of Breath  . Leg Swelling        Subjective:    [name] today has no fevers, no emesis,  No chest pain,  Voiding ok , wife at bedside, questions answered  Assessment  & Plan :    Principal Problem:   Acute respiratory failure with hypoxia ([place]) Active Problems:   Automatic implantable cardioverter-defibrillator in situ   Acute on chronic systolic HF (heart failure) (HCC)   Ischemic cardiomyopathy   CKD (chronic kidney disease)   Essential hypertension   Paroxysmal atrial fibrillation (HCC)   S/P CABG (coronary artery bypass graft)   Type 2 diabetes mellitus with circulatory disorder, without long-term current use of insulin (HCC)   CHF (congestive heart failure) (HCC)   Thrombocytopenia (HCC)   1)HFrEF--- patient with acute exacerbation of chronic systolic dysfunction CHF, EF is down to 20 to 25%, patient had a 20 pound weight gain over the last 3 weeks, increased abdominal girth and lower extremity edema as well as shortness of breath, continue IV Lasix 80 mg twice daily, continue Coreg 3.25 mg twice daily, continue amiodarone 200 mg daily, losartan 50 mg daily and metolazone 5 mg daily which patient refused today, watch renal function and electrolytes closely with IV diuresis  2)PAFib--- okay to continue Coreg 3.25 mg twice daily and amiodarone for rate control, Eliquis 5 mg twice daily for stroke prophylaxis  3)DM2--last A1c 8.7, glipizide on hold, blood sugars running higher, okay to give Lantus 10 units nightly, use Novolog/Humalog Sliding scale insulin with Accu-Cheks/Fingersticks as ordered   4)CKD  III--- creatinine is currently 1.6 which is close to patient's baseline, monitor renal function and electrolytes closely with IV diuresis   5)???  Intracardiac clot--echo noted, Dr. Eliseo Squires apparently discussed this with on-call cardiologist... Patient is on eliquis already and upon review appears more like bands with trabeculations.    Disposition/Need for in-Hospital Stay- patient unable to be discharged at this time due to significant weight gain and worsening systolic dysfunction CHF requiring IV diuresis and frequent monitoring of renal function and electrolytes  Code Status : Full   Disposition Plan  : home  Consults  :    DVT Prophylaxis  :  Eliquis  Lab Results  Component Value Date   PLT 154 11/16/2017    Inpatient Medications  Scheduled Meds: . allopurinol  100 mg Oral Daily  . amiodarone  200 mg Oral Daily  . apixaban  5 mg Oral BID  . atorvastatin  20 mg Oral q1800  . carvedilol  3.125 mg Oral BID WC  . cholecalciferol  2,000 Units Oral BID WC  . furosemide  80 mg Intravenous BID  . gabapentin  300 mg Oral TID  . insulin aspart  0-5 Units Subcutaneous QHS  . insulin aspart  0-9 Units Subcutaneous TID WC  . losartan  50 mg Oral Daily  . metolazone  5 mg Oral Daily  .  omega-3 acid ethyl esters  1 g Oral Daily  . pantoprazole  40 mg Oral Daily  . sertraline  100 mg Oral Daily  . vitamin C  1,000 mg Oral Daily  . vitamin E  400 Units Oral Daily   Continuous Infusions: PRN Meds:.albuterol, nitroGLYCERIN, ondansetron **OR** ondansetron (ZOFRAN) IV    Anti-infectives (From admission, onward)   None        Objective:   Vitals:   11/15/17 1917 11/16/17 0010 11/16/17 0455 11/16/17 1231  BP: (!) 126/56 135/61 134/71 130/61  Pulse: (!) 52 (!) 52 (!) 55 (!) 55  Resp: 18 16 18 18   Temp: 98.2 F (36.8 C) 98.1 F (36.7 C) 97.8 F (36.6 C) 98.4 F (36.9 C)  TempSrc: Oral Oral Oral Oral  SpO2: 99% 97% 97% 96%  Weight:   90.4 kg   Height:        Wt Readings  from Last 3 Encounters:  11/16/17 90.4 kg  09/26/17 85.7 kg  06/28/17 83.9 kg     Intake/Output Summary (Last 24 hours) at 11/16/2017 1729 Last data filed at 11/16/2017 1357 Gross per 24 hour  Intake 1320 ml  Output 3100 ml  Net -1780 ml     Physical Exam Patient is examined daily including today on 11/16/17 , exams remain the same as of yesterday except that has changed   Gen:- Awake Alert, able to speak in sentences HEENT:- [place].AT, No sclera icterus Neck-Supple Neck, +ve JVD,.  Lungs-diminished in bases with faint bibasilar rales CV- S1, S2 normal, irregular, CABG scar Abd-  +ve B.Sounds, Abd Soft, No tenderness, abdomen distended Extremity/Skin:-1+ to 2+ edema, pedal pulses present  Psych-affect is appropriate, oriented x3 Neuro-no new focal deficits, no tremors   Data Review:   Micro Results No results found for this or any previous visit (from the past 240 hour(s)).  Radiology Reports Dg Chest 2 View  Result Date: 11/14/2017 CLINICAL DATA:  Shortness of breath EXAM: CHEST - 2 VIEW COMPARISON:  07/02/2017 FINDINGS: Post sternotomy changes. Left-sided pacing device as before. Stimulator generator over the right chest. Mild cardiomegaly with scarring or atelectasis at the lingula and left base. No pleural effusion. Aortic atherosclerosis. No pneumothorax. IMPRESSION: No active cardiopulmonary disease. Cardiomegaly with lingular and left base atelectasis or scarring. Electronically Signed   By: Donavan Foil M.D.   On: 11/14/2017 20:02     CBC Recent Labs  Lab 11/14/17 2001 11/15/17 0527 11/16/17 0433  WBC 6.9 6.9 8.7  HGB 11.9* 11.6* 11.6*  HCT 36.5* 36.5* 35.4*  PLT 145* 165 154  MCV 107.0* 107.0* 106.6*  MCH 34.9* 34.0 34.9*  MCHC 32.6 31.8 32.8  RDW 12.6 12.6 12.7  LYMPHSABS 2.0  --   --   MONOABS 0.9  --   --   EOSABS 0.1  --   --   BASOSABS 0.0  --   --     Chemistries  Recent Labs  Lab 11/14/17 2001 11/15/17 0527 11/16/17 0433  NA 140 143 140  K  3.6 3.6 3.5  CL 104 105 104  CO2 28 31 27   GLUCOSE 168* 122* 148*  BUN 29* 29* 25*  CREATININE 1.61* 1.60* 1.65*  CALCIUM 9.0 9.0 8.4*  AST 20 19  --   ALT 17 16  --   ALKPHOS 86 78  --   BILITOT 1.0 1.0  --    ------------------------------------------------------------------------------------------------------------------ No results for input(s): CHOL, HDL, LDLCALC, TRIG, CHOLHDL, LDLDIRECT in the last 72 hours.  Lab Results  Component Value Date   HGBA1C 8.7 (H) 11/14/2017   ------------------------------------------------------------------------------------------------------------------ Recent Labs    11/14/17 2355  TSH 1.935   ------------------------------------------------------------------------------------------------------------------ No results for input(s): VITAMINB12, FOLATE, FERRITIN, TIBC, IRON, RETICCTPCT in the last 72 hours.  Coagulation profile No results for input(s): INR, PROTIME in the last 168 hours.  No results for input(s): DDIMER in the last 72 hours.  Cardiac Enzymes Recent Labs  Lab 11/14/17 2355 11/15/17 0527 11/15/17 1200  TROPONINI 0.04* 0.03* 0.03*   ------------------------------------------------------------------------------------------------------------------    Component Value Date/Time   BNP 395.1 (H) 11/14/2017 2003   BNP 98.3 09/15/2015 1251     Tatiyanna Lashley M.D on 11/16/2017 at 5:29 PM  Pager---[phone] Go to [email or web address] - password [email or web address] for contact info  [facility] - Office  [phone]

## 2017-11-16 NOTE — Plan of Care (Signed)
  Problem: Clinical Measurements: Goal: Respiratory complications will improve Outcome: Progressing Goal: Cardiovascular complication will be avoided Outcome: Progressing   Problem: Activity: Goal: Risk for activity intolerance will decrease Outcome: Progressing   Problem: Nutrition: Goal: Adequate nutrition will be maintained Outcome: Progressing   Problem: Coping: Goal: Level of anxiety will decrease Outcome: Progressing   Problem: Elimination: Goal: Will not experience complications related to bowel motility Outcome: Progressing Goal: Will not experience complications related to urinary retention Outcome: Progressing   Problem: Pain Managment: Goal: General experience of comfort will improve Outcome: Progressing   Problem: Safety: Goal: Ability to remain free from injury will improve Outcome: Progressing   Problem: Skin Integrity: Goal: Risk for impaired skin integrity will decrease Outcome: Progressing   

## 2017-11-17 LAB — BASIC METABOLIC PANEL WITH GFR
Anion gap: 10 (ref 5–15)
BUN: 28 mg/dL — ABNORMAL HIGH (ref 8–23)
CO2: 26 mmol/L (ref 22–32)
Calcium: 8.5 mg/dL — ABNORMAL LOW (ref 8.9–10.3)
Chloride: 99 mmol/L (ref 98–111)
Creatinine, Ser: 1.81 mg/dL — ABNORMAL HIGH (ref 0.61–1.24)
GFR calc Af Amer: 40 mL/min — ABNORMAL LOW
GFR calc non Af Amer: 35 mL/min — ABNORMAL LOW
Glucose, Bld: 343 mg/dL — ABNORMAL HIGH (ref 70–99)
Potassium: 3.8 mmol/L (ref 3.5–5.1)
Sodium: 135 mmol/L (ref 135–145)

## 2017-11-17 LAB — GLUCOSE, CAPILLARY
Glucose-Capillary: 156 mg/dL — ABNORMAL HIGH (ref 70–99)
Glucose-Capillary: 339 mg/dL — ABNORMAL HIGH (ref 70–99)
Glucose-Capillary: 238 mg/dL — ABNORMAL HIGH (ref 70–99)

## 2017-11-17 MED ORDER — INSULIN GLARGINE 100 UNIT/ML ~~LOC~~ SOLN
10.0000 [IU] | Freq: Every evening | SUBCUTANEOUS | Status: DC
  Administered 2017-11-17: 10 [IU] via SUBCUTANEOUS
  Filled 2017-11-17 (×2): qty 0.1

## 2017-11-17 MED ORDER — FUROSEMIDE 10 MG/ML IJ SOLN
40.0000 mg | Freq: Two times a day (BID) | INTRAMUSCULAR | Status: DC
  Administered 2017-11-17 – 2017-11-18 (×2): 40 mg via INTRAVENOUS
  Filled 2017-11-17 (×2): qty 4

## 2017-11-17 NOTE — Progress Notes (Signed)
Pt already on CPAP at this time and tolerating well. EPAP 5. Advised pt to call if needed anything.

## 2017-11-17 NOTE — Progress Notes (Signed)
Patient Demographics:    Nathaniel Bolton, is a 77 y.o. male, DOB - 1940/08/01, QPY:[id number]  Admit date - 11/14/2017   Admitting Physician Elwyn Reach, MD  Outpatient Primary MD for the patient is Nathaniel Dandy, NP  LOS - 3   Chief Complaint  Patient presents with  . Shortness of Breath  . Leg Swelling        Subjective:    [name] today has no fevers, no emesis,  No chest pain,  Son, daughter in law and wife at bedside, orthopnea and shortness of breath is better, some dyspnea on exertion persist  Assessment  & Plan :    Principal Problem:   Acute respiratory failure with hypoxia (HCC) Active Problems:   Automatic implantable cardioverter-defibrillator in situ   Acute on chronic systolic HF (heart failure) (HCC)   Ischemic cardiomyopathy   CKD (chronic kidney disease)   Essential hypertension   Paroxysmal atrial fibrillation (HCC)   S/P CABG (coronary artery bypass graft)   Type 2 diabetes mellitus with circulatory disorder, without long-term current use of insulin (HCC)   CHF (congestive heart failure) (HCC)   Thrombocytopenia (HCC)   1)HFrEF--- some dyspnea on exertion persist, patient with acute exacerbation of chronic systolic dysfunction CHF, EF is down to 20 to 25%, patient had a 20 pound weight gain over the last 3 weeks PTA, shortness of breath and lower extremity edema improving,, decreased IV Lasix to 40 mg twice daily as his creatinine is now up to 1.8 weight is down to 89.9 kg, fluid balance is negative over 1 L over the last 24 hours,  continue Coreg 3.25 mg twice daily, continue amiodarone 200 mg daily, losartan 50 mg daily , patient and wife refuses metolazone 5 mg , continue to watch renal function and electrolytes closely with IV diuresis  2)PAFib--- rate appears controlled on Coreg 3.25 mg twice daily and amiodarone , Eliquis 5 mg twice daily for stroke  prophylaxis  3)DM2--last A1c 8.7, glipizide on hold, blood sugars running higher, continue Lantus 10 units nightly, use Novolog/Humalog Sliding scale insulin with Accu-Cheks/Fingersticks as ordered   4)CKD III--- creatinine is trending up to 1.8 from 1.6, Lasix adjusted as above #1, continue to monitor renal function and electrolytes closely with IV diuresis   5)???  Intracardiac clot--echo noted, Dr. Eliseo Squires apparently discussed this with on-call cardiologist... Patient is on eliquis already and upon review appears more like bands with trabeculations.    Disposition/Need for in-Hospital Stay- patient unable to be discharged at this time due to significant weight gain and worsening systolic dysfunction CHF requiring IV diuresis and frequent monitoring of renal function and electrolytes, creatinine trending up  Code Status : Full  Disposition Plan  : home  Consults  :    DVT Prophylaxis  :  Eliquis  Lab Results  Component Value Date   PLT 154 11/16/2017    Inpatient Medications  Scheduled Meds: . allopurinol  100 mg Oral Daily  . amiodarone  200 mg Oral Daily  . apixaban  5 mg Oral BID  . atorvastatin  20 mg Oral q1800  . carvedilol  3.125 mg Oral BID WC  . cholecalciferol  2,000 Units Oral BID WC  . furosemide  40 mg  Intravenous BID  . gabapentin  300 mg Oral TID  . insulin aspart  0-5 Units Subcutaneous QHS  . insulin aspart  0-9 Units Subcutaneous TID WC  . losartan  50 mg Oral Daily  . omega-3 acid ethyl esters  1 g Oral Daily  . pantoprazole  40 mg Oral Daily  . sertraline  100 mg Oral Daily  . vitamin C  1,000 mg Oral Daily  . vitamin E  400 Units Oral Daily   Continuous Infusions: PRN Meds:.albuterol, nitroGLYCERIN, ondansetron **OR** ondansetron (ZOFRAN) IV    Anti-infectives (From admission, onward)   None        Objective:   Vitals:   11/17/17 0048 11/17/17 0440 11/17/17 1425 11/17/17 1436  BP:  131/63 99/62 (!) 120/52  Pulse:  (!) 57 (!) 48 (!) 51   Resp:  18 18   Temp:  97.9 F (36.6 C) 98.3 F (36.8 C)   TempSrc:  Oral Oral   SpO2:  94% 96% 98%  Weight: 89.9 kg     Height:        Wt Readings from Last 3 Encounters:  11/17/17 89.9 kg  09/26/17 85.7 kg  06/28/17 83.9 kg     Intake/Output Summary (Last 24 hours) at 11/17/2017 1622 Last data filed at 11/17/2017 1426 Gross per 24 hour  Intake 1320 ml  Output 2400 ml  Net -1080 ml     Physical Exam Patient is examined daily including today on 11/17/17 , exams remain the same as of yesterday except that has changed   Gen:- Awake Alert, able to speak in sentences HEENT:- [employer].AT, No sclera icterus Neck-Supple Neck, +ve JVD,.  Lungs-diminished in bases with few bibasilar rales CV- S1, S2 normal, irregular, CABG scar Abd-  +ve B.Sounds, Abd Soft, No tenderness, abdomen distended Extremity/Skin:-1+ edema, pedal pulses present  Psych-affect is appropriate, oriented x3 Neuro-no new focal deficits, no tremors   Data Review:   Micro Results No results found for this or any previous visit (from the past 240 hour(s)).  Radiology Reports Dg Chest 2 View  Result Date: 11/14/2017 CLINICAL DATA:  Shortness of breath EXAM: CHEST - 2 VIEW COMPARISON:  07/02/2017 FINDINGS: Post sternotomy changes. Left-sided pacing device as before. Stimulator generator over the right chest. Mild cardiomegaly with scarring or atelectasis at the lingula and left base. No pleural effusion. Aortic atherosclerosis. No pneumothorax. IMPRESSION: No active cardiopulmonary disease. Cardiomegaly with lingular and left base atelectasis or scarring. Electronically Signed   By: Donavan Foil M.D.   On: 11/14/2017 20:02     CBC Recent Labs  Lab 11/14/17 2001 11/15/17 0527 11/16/17 0433  WBC 6.9 6.9 8.7  HGB 11.9* 11.6* 11.6*  HCT 36.5* 36.5* 35.4*  PLT 145* 165 154  MCV 107.0* 107.0* 106.6*  MCH 34.9* 34.0 34.9*  MCHC 32.6 31.8 32.8  RDW 12.6 12.6 12.7  LYMPHSABS 2.0  --   --   MONOABS 0.9  --   --    EOSABS 0.1  --   --   BASOSABS 0.0  --   --     Chemistries  Recent Labs  Lab 11/14/17 2001 11/15/17 0527 11/16/17 0433 11/17/17 1005  NA 140 143 140 135  K 3.6 3.6 3.5 3.8  CL 104 105 104 99  CO2 28 31 27 26   GLUCOSE 168* 122* 148* 343*  BUN 29* 29* 25* 28*  CREATININE 1.61* 1.60* 1.65* 1.81*  CALCIUM 9.0 9.0 8.4* 8.5*  AST 20 19  --   --  ALT 17 16  --   --   ALKPHOS 86 78  --   --   BILITOT 1.0 1.0  --   --    ------------------------------------------------------------------------------------------------------------------ No results for input(s): CHOL, HDL, LDLCALC, TRIG, CHOLHDL, LDLDIRECT in the last 72 hours.  Lab Results  Component Value Date   HGBA1C 8.7 (H) 11/14/2017   ------------------------------------------------------------------------------------------------------------------ Recent Labs    11/14/17 2355  TSH 1.935   ------------------------------------------------------------------------------------------------------------------ No results for input(s): VITAMINB12, FOLATE, FERRITIN, TIBC, IRON, RETICCTPCT in the last 72 hours.  Coagulation profile No results for input(s): INR, PROTIME in the last 168 hours.  No results for input(s): DDIMER in the last 72 hours.  Cardiac Enzymes Recent Labs  Lab 11/14/17 2355 11/15/17 0527 11/15/17 1200  TROPONINI 0.04* 0.03* 0.03*   ------------------------------------------------------------------------------------------------------------------    Component Value Date/Time   BNP 395.1 (H) 11/14/2017 2003   BNP 98.3 09/15/2015 1251     Oprah Camarena M.D on 11/17/2017 at 4:22 PM  Pager---[phone] Go to [email or web address] - password [email or web address] for contact info  [facility] - Office  [phone]

## 2017-11-18 DIAGNOSIS — I5023 Acute on chronic systolic (congestive) heart failure: Secondary | ICD-10-CM

## 2017-11-18 LAB — BASIC METABOLIC PANEL
ANION GAP: 7 (ref 5–15)
BUN: 30 mg/dL — ABNORMAL HIGH (ref 8–23)
CALCIUM: 8.7 mg/dL — AB (ref 8.9–10.3)
CO2: 29 mmol/L (ref 22–32)
Chloride: 102 mmol/L (ref 98–111)
Creatinine, Ser: 1.88 mg/dL — ABNORMAL HIGH (ref 0.61–1.24)
GFR calc non Af Amer: 33 mL/min — ABNORMAL LOW (ref >60)
GFR, EST AFRICAN AMERICAN: 38 mL/min — AB (ref >60)
GLUCOSE: 137 mg/dL — AB (ref 70–99)
POTASSIUM: 3.5 mmol/L (ref 3.5–5.1)
Sodium: 138 mmol/L (ref 135–145)

## 2017-11-18 LAB — GLUCOSE, CAPILLARY
GLUCOSE-CAPILLARY: 121 mg/dL — AB (ref 70–99)
Glucose-Capillary: 190 mg/dL — ABNORMAL HIGH (ref 70–99)
Glucose-Capillary: 186 mg/dL — ABNORMAL HIGH (ref 70–99)

## 2017-11-18 MED ORDER — FUROSEMIDE 80 MG PO TABS: 80.0000 mg | ORAL_TABLET | Freq: Two times a day (BID) | ORAL | 2 refills | Status: DC

## 2017-11-18 MED ORDER — POTASSIUM CHLORIDE CRYS ER 20 MEQ PO TBCR
40.0000 meq | EXTENDED_RELEASE_TABLET | Freq: Once | ORAL | Status: DC
  Filled 2017-11-18: qty 2

## 2017-11-18 MED ORDER — CARVEDILOL 3.125 MG PO TABS: 3.1250 mg | ORAL_TABLET | Freq: Two times a day (BID) | ORAL | 2 refills | Status: DC

## 2017-11-18 MED ORDER — POTASSIUM CHLORIDE CRYS ER 20 MEQ PO TBCR
40.0000 meq | EXTENDED_RELEASE_TABLET | Freq: Once | ORAL | Status: AC
  Administered 2017-11-18: 40 meq via ORAL
  Filled 2017-11-18: qty 2

## 2017-11-18 MED ORDER — INSULIN GLARGINE 100 UNITS/ML SOLOSTAR PEN: 10.0000 [IU] | PEN_INJECTOR | Freq: Every day | SUBCUTANEOUS | 11 refills | Status: AC

## 2017-11-18 MED ORDER — LOSARTAN POTASSIUM 50 MG PO TABS: 50.0000 mg | ORAL_TABLET | Freq: Every day | ORAL | 2 refills | Status: DC

## 2017-11-18 MED ORDER — POTASSIUM CHLORIDE ER 10 MEQ PO TBCR: 10.0000 meq | EXTENDED_RELEASE_TABLET | Freq: Every day | ORAL | 1 refills | Status: DC

## 2017-11-18 MED ORDER — INSULIN ASPART 100 UNIT/ML FLEXPEN: PEN_INJECTOR | SUBCUTANEOUS | 11 refills | Status: AC

## 2017-11-18 MED ORDER — METOLAZONE 2.5 MG PO TABS: ORAL_TABLET | ORAL | 2 refills | Status: DC

## 2017-11-18 MED FILL — POTASSIUM CHL ER M10 TABLET: 10 | 30 days supply | Qty: 30 | Fill #0

## 2017-11-18 NOTE — Discharge Summary (Signed)
Nathaniel Bolton, is a 77 y.o. male  DOB October 30, 1940  MRN [id number].  Admission date:  11/14/2017  Admitting Physician  Elwyn Reach, MD  Discharge Date:  11/18/2017   Primary MD  [name], NP  Recommendations for primary care physician for things to follow:   1)Very low-salt diet advised 2)Weigh yourself daily, call if you gain more than 3 pounds in 1 day or more than 5 pounds in 1 week as your diuretic medications may need to be adjusted 3)Limit your Fluid  intake to no more than 60 ounces (1.8 Liters) per day 4)TAKE 1 TABLET of Metolazone/Zaroxylyn BY MOUTH 2 TIMES A WEEK ON Tuesday  AND Saturday 30 MINUTES BEFORE MORNING DOSE OF LASIX 5) you are already taking the blood thinner Eliquis so Avoid ibuprofen/Advil/Aleve/Motrin/Goody Powders/Naproxen/BC powders/Meloxicam/Diclofenac/Indomethacin and other Nonsteroidal anti-inflammatory medications as these will make you more likely to bleed and can cause stomach ulcers, can also cause Kidney problems.  6)Please make appointment with Dr. Marigene Ehlers at Heart Failure clinic prior to discharge 7) start Lantus insulin 10 units every evening.Marland Kitchen 8) use NovoLog insulin 3 times a day with meals 9)Stop Glipizide 10)insulin aspart (novoLOG) injection  -A)inject 3 units of Novolog insulin with meal if blood sugar is over 151 before meals B)inject 2 units of Novolog with meal if blood sugar is above 110, but less than 150 before meals C)Do  not inject any Novolog insulin if your blood sugar is less than 110 before meals 11) you need repeat BMP/kidney and electrolyte test within a week of discharge 12)Wound Care--- Rt Foot Dressing procedure/placement/frequency: Continue present plan of care patient was using prior to admission: dry 2X2 tucked into the space to promote healing and absorb moisture   Admission Diagnosis  Shortness of breath [R06.02] Peripheral edema  [R60.9] Hypoxia [R09.02] Bilateral lower extremity edema [R60.0] CHF (congestive heart failure) ([place]) [I50.9]   Discharge Diagnosis  Shortness of breath [R06.02] Peripheral edema [R60.9] Hypoxia [R09.02] Bilateral lower extremity edema [R60.0] CHF (congestive heart failure) ([place]) [I50.9]    Principal Problem:   Acute on chronic systolic HF (heart failure) (HCC) Active Problems:   Automatic implantable cardioverter-defibrillator in situ   Ischemic cardiomyopathy   CKD (chronic kidney disease)   Essential hypertension   Paroxysmal atrial fibrillation (HCC)   S/P CABG (coronary artery bypass graft)   Type 2 diabetes mellitus with circulatory disorder, without long-term current use of insulin (HCC)   CHF (congestive heart failure) (HCC)   Thrombocytopenia (HCC)   Acute respiratory failure with hypoxia (HCC)      Past Medical History:  Diagnosis Date  . CAD (coronary artery disease)   . CKD stage 3 due to type 2 diabetes mellitus ([place])   . Diabetes mellitus    Type 2  . Erectile dysfunction   . Gout   . Hyperlipidemia   . Hypertension   . ICD (implantable cardiac defibrillator) in place   . Myocardial infarction, old    x2 in [place]. S/P CABG 2003, Dr  Owen  . Obstructive lung disease ([place])    Moderate per prior PFT's  . Paroxysmal atrial fibrillation ([place]) 3/17   discovered on event monitor  . Stroke [facility])    2013  . Systolic CHF, acute on chronic (HCC)    EF is 27% per myoview and echo October 2012  . Ventricular tachycardia ([place]) 08/2014   175 bpm    Past Surgical History:  Procedure Laterality Date  . BYPASS GRAFT     x5  . CARDIAC DEFIBRILLATOR PLACEMENT  2008   [facility] by Dr Elonda Husky at [facility]  . cataract surgery    . CHOLECYSTECTOMY    . COLONOSCOPY  12/23/2006   Small colonic polyps, status post polypectomy. Interanl hemorrhoids.   . ICD GENERATOR CHANGEOUT N/A 08/30/2016   MDT Visia AF VR ICD implanted by Dr Rayann Heman  . KNEE ARTHROSCOPY      right  . UMBILICAL HERNIA REPAIR         HPI  from the history and physical done on the day of admission:    Chief Complaint: Shortness of breath and cough  HPI: Nathaniel Bolton is a 77 y.o. male with medical history significant of diastolic dysfunction CHF, diabetes, chronic kidney disease stage III, coronary artery disease status post coronary artery bypass grafting, history of gout, hypertension and hyperlipidemia who apparently has been having issues with his diuretics lately.  He has been taking all his medications as prescribed.  Wife has noted progressive swelling of his feet and abdomen as well as shortness of breath.  Patient came to the ER where he was found to have oxygen sats in the 80s with mobility.  He denied any chest pain.  Denied any nausea vomiting or diarrhea.  His cough is dry at this point no sputum.  Patient also has worsening renal function with aggressive diuresis.  It appears.  He takes metolazone every other day with Lasix daily.  Patient is being admitted due to new hypoxemia indicating possible worsening CHF with exacerbation..  ED Course: Temperature is 99, blood pressure 165/72, pulse 60, respiratory of 18 and oxygen sat 84% on room air and 100% on 2 L.  White count is 6.9 hemoglobin 11.9 and platelet 145.  Sodium 140 potassium 3.6.  BUN is 29 and creatinine 1.61 glucose 168.  BNP is 395.  Chest x-ray showed no active cardiopulmonary disease mainly cardiomegaly with some atelectasis.  Patient has significant lower extremity edema.  Is being admitted with acute anemia probably due to CHF.  He has remote tobacco smoking but no COPD diagnosis.  Review of Systems: As per HPI otherwise 10 point review of systems negative    Hospital Course:       1)HFrEF---  shortness of breath at rest resolved, dyspnea on exertion mostly resolved, patient was admitted with acute exacerbation of chronic systolic dysfunction CHF, EF is down to 20 to 25%, patient had a 20 pound  weight gain over the last 3 weeks PTA, lower extremity edema mostly resolved,,  he was treated with IV Lasix, , creatinine 1-1.8 from 1.6 , please balance negative weight down to about 90 kg,  continue Coreg 3.25 mg twice daily, continue amiodarone 200 mg daily, losartan 50 mg daily ,  use metolazone 2.5 mg on Tuesdays and Saturdays before a.m. Lasix dose ,  discharge on Lasix 80 mg twice daily... Outpatient referral to heart failure clinic  2)PAFib---  overall stable, rate appears controlled on Coreg 3.25 mg twice daily and  amiodarone , Eliquis 5 mg twice daily for stroke prophylaxis  3)DM2--last A1c 8.7, stop glipizide , discharged on Lantus 10 units nightly, use NovoLog insulin as instructed with meals   4)CKD III--- creatinine is trending up to 1.8 from 1.6, Lasix adjusted as above #1,  repeat BMP within a week with PCP  5)???  Intracardiac clot--echo noted, Dr. Eliseo Squires apparently discussed this with on-call cardiologist... Patient is on eliquis already and upon review appears more like bands with trabeculations.   Discharge Condition: stable  F/u-follow-up with heart failure clinic  Diet and Activity recommendation:  As advised  Discharge Instructions    Discharge Instructions    (HEART FAILURE PATIENTS) Call MD:  Anytime you have any of the following symptoms: 1) 3 pound weight gain in 24 hours or 5 pounds in 1 week 2) shortness of breath, with or without a dry hacking cough 3) swelling in the hands, feet or stomach 4) if you have to sleep on extra pillows at night in order to breathe.   Complete by:  As directed    AMB referral to CHF clinic   Complete by:  As directed    Please make appointment with Dr. Marigene Ehlers at Heart Failure clinic prior to discharge   Call MD for:  difficulty breathing, headache or visual disturbances   Complete by:  As directed    Call MD for:  persistant dizziness or light-headedness   Complete by:  As directed    Call MD for:  persistant nausea and  vomiting   Complete by:  As directed    Call MD for:  severe uncontrolled pain   Complete by:  As directed    Call MD for:  temperature >100.4   Complete by:  As directed    Diet - low sodium heart healthy   Complete by:  As directed    Discharge instructions   Complete by:  As directed    1)Very low-salt diet advised 2)Weigh yourself daily, call if you gain more than 3 pounds in 1 day or more than 5 pounds in 1 week as your diuretic medications may need to be adjusted 3)Limit your Fluid  intake to no more than 60 ounces (1.8 Liters) per day 4)TAKE 1 TABLET of Metolazone/Zaroxylyn BY MOUTH 2 TIMES A WEEK ON Tuesday  AND Saturday 30 MINUTES BEFORE MORNING DOSE OF LASIX 5) you are already taking the blood thinner Eliquis so Avoid ibuprofen/Advil/Aleve/Motrin/Goody Powders/Naproxen/BC powders/Meloxicam/Diclofenac/Indomethacin and other Nonsteroidal anti-inflammatory medications as these will make you more likely to bleed and can cause stomach ulcers, can also cause Kidney problems.  6)Please make appointment with Dr. Marigene Ehlers at Heart Failure clinic prior to discharge 7) start Lantus insulin 10 units every evening.Marland Kitchen 8) use NovoLog insulin 3 times a day with meals 9)Stop Glipizide 10)insulin aspart (novoLOG) injection  -A)inject 3 units of Novolog insulin with meal if blood sugar is over 151 before meals B)inject 2 units of Novolog with meal if blood sugar is above 110, but less than 150 before meals C)Do  not inject any Novolog insulin if your blood sugar is less than 110 before meals 11) you need repeat BMP/kidney and electrolyte test within a week of discharge   Increase activity slowly   Complete by:  As directed        Discharge Medications     Allergies as of 11/18/2017      Reactions   Adhesive [tape] Itching, Rash   Testosterone patch adhesive      Medication  List    STOP taking these medications   cefdinir 300 MG capsule Commonly known as:  OMNICEF   doxycycline 100 MG  tablet Commonly known as:  VIBRA-TABS   glipiZIDE 5 MG tablet Commonly known as:  GLUCOTROL   insulin aspart protamine- aspart (70-30) 100 UNIT/ML injection Commonly known as:  NOVOLOG MIX 70/30   predniSONE 5 MG (21) Tbpk tablet Commonly known as:  STERAPRED UNI-PAK 21 TAB     TAKE these medications   allopurinol 100 MG tablet Commonly known as:  ZYLOPRIM Take 100 mg by mouth daily.   amiodarone 200 MG tablet Commonly known as:  PACERONE Take 1 tablet (200 mg total) by mouth daily.   apixaban 5 MG Tabs tablet Commonly known as:  ELIQUIS Take 1 tablet (5 mg total) by mouth 2 (two) times daily.   atorvastatin 20 MG tablet Commonly known as:  LIPITOR Take 1 tablet (20 mg total) by mouth daily. What changed:    how much to take  when to take this   carvedilol 3.125 MG tablet Commonly known as:  COREG Take 1 tablet (3.125 mg total) by mouth 2 (two) times daily with a meal. What changed:    medication strength  how much to take   FISH OIL PO Take 2 tablets by mouth 2 (two) times daily.   furosemide 80 MG tablet Commonly known as:  LASIX Take 1 tablet (80 mg total) by mouth 2 (two) times daily. What changed:    medication strength  how much to take   gabapentin 300 MG capsule Commonly known as:  NEURONTIN Take 300 mg by mouth 3 (three) times daily.   insulin aspart 100 UNIT/ML FlexPen Commonly known as:  NOVOLOG inject 3 units if sugar is >151 ac meal; inject 2 units if sugar is btween 110 & 150, Do not inject any insulin  if your sugar is < 110   insulin glargine 100 unit/mL Sopn Commonly known as:  LANTUS Inject 0.1 mLs (10 Units total) into the skin daily.   losartan 50 MG tablet Commonly known as:  COZAAR Take 1 tablet (50 mg total) by mouth daily. Start taking on:  11/19/2017 What changed:    medication strength  how much to take   metolazone 2.5 MG tablet Commonly known as:  ZAROXOLYN TAKE 1 TABLET BY MOUTH 2 TIMES A WEEK ON Tuesday  AND  Saturday 30 MINUTES BEFORE MORNING DOSE OF LASIX What changed:  See the new instructions.   NITROSTAT 0.4 MG SL tablet Generic drug:  nitroGLYCERIN Place 0.4 mg under the tongue every 5 (five) minutes as needed for chest pain (MAX 3 TABLETS).   NON FORMULARY Needles, syringes with needles, glucose reagent test strips   omeprazole 40 MG capsule Commonly known as:  PRILOSEC Take 1 capsule (40 mg total) by mouth daily. What changed:  how much to take   PROBIOTIC ACIDOPHILUS PO Take 1 capsule by mouth 2 (two) times daily.   SAW PALMETTO PO Take 1 tablet by mouth daily.   sertraline 100 MG tablet Commonly known as:  ZOLOFT Take 100 mg by mouth daily.   SSD 1 % cream Generic drug:  silver sulfADIAZINE Apply 1 application topically 2 (two) times daily.   tamsulosin 0.4 MG Caps capsule Commonly known as:  FLOMAX Take 0.4 mg by mouth daily.   VENTOLIN HFA 108 (90 Base) MCG/ACT inhaler Generic drug:  albuterol   vitamin C 1000 MG tablet Take 1,000 mg by mouth daily.  Vitamin D3 50 MCG (2000 UT) Tabs Take 2,000 Units by mouth 2 (two) times daily.   vitamin E 400 UNIT capsule Take 400 Units by mouth daily.       Major procedures and Radiology Reports - PLEASE review detailed and final reports for all details, in brief -    Dg Chest 2 View  Result Date: 11/14/2017 CLINICAL DATA:  Shortness of breath EXAM: CHEST - 2 VIEW COMPARISON:  07/02/2017 FINDINGS: Post sternotomy changes. Left-sided pacing device as before. Stimulator generator over the right chest. Mild cardiomegaly with scarring or atelectasis at the lingula and left base. No pleural effusion. Aortic atherosclerosis. No pneumothorax. IMPRESSION: No active cardiopulmonary disease. Cardiomegaly with lingular and left base atelectasis or scarring. Electronically Signed   By: Donavan Foil M.D.   On: 11/14/2017 20:02   Today   Subjective    Bharath Bernstein today has no new concerns, wife at bedside, questions  answered, voiding well, no shortness of breath at rest, ambulated without significant dyspnea on exertion chest pains or hypoxia          Patient has been seen and examined prior to discharge   Objective   Blood pressure (!) 127/58, pulse (!) 54, temperature 98.3 F (36.8 C), temperature source Oral, resp. rate 18, height 5\' 10"  (1.778 m), weight 90.2 kg, SpO2 95 %.   Intake/Output Summary (Last 24 hours) at 11/18/2017 1254 Last data filed at 11/18/2017 0900 Gross per 24 hour  Intake 480 ml  Output 2150 ml  Net -1670 ml    Exam Patient is examined daily including today on 11/18/17 , exams remain the same as of yesterday except that has changed   Gen:- Awake Alert, able to speak in sentences HEENT:- [place].AT, No sclera icterus Neck-Supple Neck, No JVD elevation,.  Lungs-improved air movement without wheezing or significant rails at this time CV- S1, S2 normal, irregular, CABG scar Abd-  +ve B.Sounds, Abd Soft, No tenderness, abdomen distended Extremity/Skin:-Resolved pedal edema, pedal pulses present  Psych-affect is appropriate, oriented x3 Neuro-no new focal deficits, no tremors   Data Review   CBC w Diff:  Lab Results  Component Value Date   WBC 8.7 11/16/2017   HGB 11.6 (L) 11/16/2017   HCT 35.4 (L) 11/16/2017   PLT 154 11/16/2017   LYMPHOPCT 29 11/14/2017   MONOPCT 13 11/14/2017   EOSPCT 2 11/14/2017   BASOPCT 1 11/14/2017    CMP:  Lab Results  Component Value Date   NA 138 11/18/2017   NA 144 01/03/2016   K 3.5 11/18/2017   CL 102 11/18/2017   CO2 29 11/18/2017   BUN 30 (H) 11/18/2017   BUN 23 01/03/2016   CREATININE 1.88 (H) 11/18/2017   CREATININE 1.28 (H) 09/15/2015   PROT 6.0 (L) 11/15/2017   ALBUMIN 3.1 (L) 11/15/2017   BILITOT 1.0 11/15/2017   ALKPHOS 78 11/15/2017   AST 19 11/15/2017   ALT 16 11/15/2017  .   Total Discharge time is about 33 minutes  Roxan Hockey M.D on 11/18/2017 at 12:54 PM  Pager---[phone]  Go to  [email or web address] - password [email or web address] for contact info  [facility] - Office  [phone]

## 2017-11-18 NOTE — Progress Notes (Signed)
Pt's having wide QRS on cardiac monitor, pt resting in bed, asymptomatic. Ewing Schlein on call notified.

## 2017-11-18 NOTE — Progress Notes (Signed)
Wife places pt on and off CPAP. Added H2O to water chamber.

## 2017-11-18 NOTE — Progress Notes (Signed)
Physical Therapy Treatment Patient Details Name: Nathaniel Bolton MRN: [id number] DOB: 02-Sep-1940 Today's Date: 11/18/2017    History of Present Illness Pt is a 76 y.o. male with medical history significant of diastolic dysfunction CHF, diabetes, chronic kidney disease stage III, coronary artery disease status post coronary artery bypass grafting, history of gout, hypertension and hyperlipidemia. He presented to the ED with c/o SOB. He was admitted for acute respiratory failure related to CHF exacerbation.     PT Comments    Patient progressing well towards PT goals. Tolerated gait training today with Min guard-Supervision for safety. Sp02 dropped to 88% on RA after each ambulation bout but recovered quickly with cues for pursed lip breathing. Pt asymptomatic. HR ranged from 50s-70s bpm throughout session. Encouraged increasing activity and multiple ambulation bouts with nursing today to improve activity tolerance and 02 sats. Rn aware. Will follow.     Follow Up Recommendations  No PT follow up;Supervision - Intermittent     Equipment Recommendations  None recommended by PT    Recommendations for Other Services       Precautions / Restrictions Precautions Precautions: Fall Precaution Comments: watch sats Restrictions Weight Bearing Restrictions: No    Mobility  Bed Mobility Overal bed mobility: Modified Independent                Transfers Overall transfer level: Needs assistance Equipment used: None Transfers: Sit to/from Stand Sit to Stand: Supervision         General transfer comment: supervision for safety. Stood from [employer].   Ambulation/Gait Ambulation/Gait assistance: Min guard;Supervision Gait Distance (Feet): 300 Feet(x2 bouts) Assistive device: None Gait Pattern/deviations: Step-through pattern;Decreased stride length;Drifts right/left     General Gait Details: Slow, mostly steady gait with occasional increased speed/stumbles but no overt LOB;  Sp02 dropped to 88% after each bout during rest break but recovered quickly with cues for pursed lip breathing. 1 long rest break btw bouts.   Stairs             Wheelchair Mobility    Modified Rankin (Stroke Patients Only)       Balance Overall balance assessment: Needs assistance Sitting-balance support: Feet supported;No upper extremity supported Sitting balance-Leahy Scale: Good     Standing balance support: During functional activity Standing balance-Leahy Scale: Good                              Cognition Arousal/Alertness: Awake/alert Behavior During Therapy: WFL for tasks assessed/performed Overall Cognitive Status: Within Functional Limits for tasks assessed                                        Exercises      General Comments General comments (skin integrity, edema, etc.): Wife present during session.      Pertinent Vitals/Pain Pain Assessment: No/denies pain    Home Living                      Prior Function            PT Goals (current goals can now be found in the care plan section) Progress towards PT goals: Progressing toward goals    Frequency    Min 3X/week      PT Plan Current plan remains appropriate    Co-evaluation  AM-PAC PT "6 Clicks" Daily Activity  Outcome Measure  Difficulty turning over in bed (including adjusting bedclothes, sheets and blankets)?: None Difficulty moving from lying on back to sitting on the side of the bed? : None Difficulty sitting down on and standing up from a chair with arms (e.g., wheelchair, bedside commode, etc,.)?: None Help needed moving to and from a bed to chair (including a wheelchair)?: None Help needed walking in hospital room?: A Little Help needed climbing 3-5 steps with a railing? : A Little 6 Click Score: 22    End of Session Equipment Utilized During Treatment: Gait belt Activity Tolerance: Patient tolerated treatment  well Patient left: in bed;with call bell/phone within reach;with family/visitor present;with bed alarm set Nurse Communication: Mobility status PT Visit Diagnosis: Difficulty in walking, not elsewhere classified (R26.2)     Time: 4799-8721 PT Time Calculation (min) (ACUTE ONLY): 20 min  Charges:  $Therapeutic Exercise: 8-22 mins                     Wray Kearns, PT, DPT Acute Rehabilitation Services Pager [phone] Office [place] 11/18/2017, 9:21 AM

## 2017-11-18 NOTE — Consult Note (Signed)
   [facility] CM Inpatient Consult   11/18/2017  Nathaniel Bolton [birth year] [id number]  Patient evaluated for community based chronic disease management services with [place] Management Program as a benefit of patient's Medicare Insurance. Spoke with patient at bedside to explain [place] Management services.  Patient will receive post hospital discharge call and  for assessments and disease process education.  Consent form signed. Left contact information and [facility] literature at bedside. Made Inpatient Case Manager aware that [place] Management following. Of note, [facility] Care Management services does not replace or interfere with any services that are arranged by inpatient case management or social work.  For additional questions or referrals please contact:    Natividad Brood, RN BSN [place] [facility] Liaison  [phone] business mobile phone Toll free office [phone]

## 2017-11-18 NOTE — Progress Notes (Signed)
Order for discharge, ccmd called and spoke to [place] about dc telemetry, iv removed, pt reports feeling well, will get AVS paperwork and review with pt and wife

## 2017-11-18 NOTE — Consult Note (Addendum)
Nathaniel Bolton Nurse wound consult note Reason for Consult: Consult requested for right foot wound Wound type: Healing full thickness wound located in the webing between 4th and 5th toes Measurement: .2X.2X.1cm Wound bed: pink and dry Drainage (amount, consistency, odor) no odor or drainage Periwound: intact skin surrounding Dressing procedure/placement/frequency: Continue present plan of care patient was using prior to admission: dry 2X2 tucked into the space to promote healing and absorb moisture. Discussed plan of care with patient and wife at the bedside. Please re-consult if further assistance is needed.  Thank-you,  Julien Girt MSN, [place], [place], [place], [place]

## 2017-11-18 NOTE — Progress Notes (Signed)
   11/18/17 1300  Clinical Encounter Type  Visited With Patient and family together  Visit Type Initial;Follow-up  Referral From Nurse  Consult/Referral To Chaplain  Spiritual Encounters  Spiritual Needs Prayer;Literature  Stress Factors  Patient Stress Factors None identified  Family Stress Factors None identified   Responded to spiritual consult and follow up. PT and family were alert and ready for AD signing. Family were thankful for Chaplain visit. I set up notary and witnesses for signing and gave original copy to Nurse for PT's file and returning of original to PT. I offered spiritual care with ministry of presence and finished with prayer. Chaplain available as needed.   Chaplain Fidel Levy [phone]

## 2017-11-19 ENCOUNTER — Other Ambulatory Visit: Payer: Self-pay

## 2017-11-20 ENCOUNTER — Other Ambulatory Visit: Payer: Self-pay | Admitting: *Deleted

## 2017-11-20 DIAGNOSIS — G4733 Obstructive sleep apnea (adult) (pediatric): Secondary | ICD-10-CM | POA: Diagnosis not present

## 2017-11-20 NOTE — Patient Outreach (Signed)
[facility]) Care Management  11/20/2017  ROBERTS BON February 25, 1940 [id number]  Initial telephone outreach Transition of care by Primary care office, Laverna Peace, NP , [facility] heath internal medicine.   Referral received : 11/12 Referral source : [facility] hospital liaison  Referral reason : Admission [place] , 11/7-11, Dx Acute on chronic heart failure . Insurance ; [employer]    PMHX is significant for Hypertension, Atrial fib, Ventricular tachycardia,  AICD, CABG, Diabetes,  Successful outreach call to patient, HIPAA verified, explained reason for the call and [facility] care management services, patient able to recall conversation with hospital liaison regarding program,  gave verbal agreement  to program and  consent to speak with his wife [name] that helps with managing his care at home.    Patient and wife discussed recent admission to the hospital.  Heart failure - Wife reports that this was patient 1st admission to hospital for heart failure , she states that he had a 20 lbs weight gain prior to admission .  Patient discharge weight was 199 at 11/11, initial weight at home on 11/12 am was 205, and today's weight 203. She reports improvement in swelling , still has some swelling left foot. Patient denies shortness of breath .  Weight 1day home - 205 on 12, today - weight - 203 Wife discussed having the education book "Living with heart failure and she has been reviewing. She is able to state daily fluid limit of 60 ounces in a day  and how to measure. She understands patient should be on a low salt diet with limit of 2000 mg and has been reviewing booklet looking at label reading and needs a little more review on that . Discussed worsening symptoms of heart failure she states that she has been also reading booklet on the zones , reviewed yellow zone symptoms and notifying MD of weight gain of   Diabetes  Wife discussed patient blood sugars have been running in the 200's since  discharge , reading at lunch today, 214. Wife is checking blood sugars 3 times daily and keeping a record. Wife discussed patient blood sugar where running high prior to admission and he was taking 70/30.  Wife is aware of A1c checked while in the hospital being 8.7.   Medications  Patient was recently discharged from hospital and all medications have been reviewed.   Appointments  Patient has post hospital visit with PCP , Laverna Peace, NP on Friday , 11/15 and initial heart failure clinic visit on 11/  Along in conversation , wife updated address that they now live in [place] instead of [place] as listed.    Return call to patient wife.  Discussed with  wife in anticipation of home visit need, for further education and support  it may be appropriate to transition to Care Manager that services that area. Discussed that  Tomasa Rand will plan call to her by the end of this week to arrange home visit in the next week.   Plan Will notify CMA of transition to Tomasa Rand, that provides  care management in [place] area and patient will benefit from a home visit.  Care manager will follow up with patient in the next 3 business day for care planning , goal setting , further assessments and scheduling initial home visit .    Joylene Draft, RN, [place] Management Coordinator  [phone]- Mobile [phone]- Toll Free Main Office

## 2017-11-21 ENCOUNTER — Other Ambulatory Visit: Payer: Self-pay

## 2017-11-21 NOTE — Patient Outreach (Signed)
Care coordination:  Placed call to patient and wife. Reviewed reason for call. Offered home visit for assessment and heart failure education.  Appointment made for 11/26/2017 at 1pm. Confirmed address and provided my contact information.  Tomasa Rand, RN, BSN, CEN [facility] [employer] [phone]

## 2017-11-22 DIAGNOSIS — Z6830 Body mass index (BMI) 30.0-30.9, adult: Secondary | ICD-10-CM | POA: Diagnosis not present

## 2017-11-22 DIAGNOSIS — I10 Essential (primary) hypertension: Secondary | ICD-10-CM | POA: Diagnosis not present

## 2017-11-22 DIAGNOSIS — N189 Chronic kidney disease, unspecified: Secondary | ICD-10-CM | POA: Diagnosis not present

## 2017-11-22 DIAGNOSIS — Z79899 Other long term (current) drug therapy: Secondary | ICD-10-CM | POA: Diagnosis not present

## 2017-11-22 DIAGNOSIS — I509 Heart failure, unspecified: Secondary | ICD-10-CM | POA: Diagnosis not present

## 2017-11-22 DIAGNOSIS — R609 Edema, unspecified: Secondary | ICD-10-CM | POA: Diagnosis not present

## 2017-11-26 ENCOUNTER — Other Ambulatory Visit: Payer: Self-pay

## 2017-11-26 ENCOUNTER — Ambulatory Visit: Payer: Self-pay

## 2017-11-26 NOTE — Patient Outreach (Signed)
[facility]) Care Management   11/26/2017  Nathaniel Bolton [birth year] [id number]  Nathaniel Bolton is an 77 y.o. male 2pm arrived for home visit. Wife present.  Subjective:  Discharged home from hospital  11/18/2017.  With heart failure.  Reports 20 pounds of fluid removed. Home weights since discharge are 197-205.  CBG fasting of 101-234.  Reports taking sliding scale insulin.  Patient reports that he is feeling pretty good.  Patient reports taking all his medications as prescribed but states he is struggling to pay for insulin. Reports his daughter paid for insulin last week.  Patient reports that he gets a lot of his prescriptions from the [place] in [place].  Patient has an appointment to see 'family MD' on 12/04/2017 and will try to get his insulin from the [place].   Objective:  Awake and alert, pleasant. Waking without any assistive devices. Step over tub into shower noted.  Today's Vitals   11/26/17 1415 11/26/17 1421  BP: 132/60   Pulse: (!) 57   Resp: 18   SpO2: 98%   Weight: 197 lb (89.4 kg)   Height: 1.778 m (5\' 10" )   PainSc:  0-No pain   Review of Systems  Constitutional: Negative.   HENT: Positive for hearing loss.   Eyes:       Wears glasses  Respiratory: Negative.   Cardiovascular: Positive for leg swelling.  Gastrointestinal: Negative.   Genitourinary: Negative.   Musculoskeletal: Positive for falls.  Skin:       Right sore between the right fourth and fifth toe  Neurological: Negative.   Endo/Heme/Allergies: Bruises/bleeds easily.  Psychiatric/Behavioral: Positive for memory loss.    Physical Exam  Constitutional: He is oriented to person, place, and time. He appears well-developed and well-nourished.  Cardiovascular: Normal rate.  irregular  Respiratory: Effort normal and breath sounds normal.  GI: Soft. Bowel sounds are normal.  Musculoskeletal: Normal range of motion. He exhibits edema.  2 plus edema  Neurological: He is alert and  oriented to person, place, and time.  Skin: Skin is warm and dry.  White open area between the right 4th and 5th toe.  Size of a pen head.  Psychiatric: He has a normal mood and affect. His behavior is normal. Judgment and thought content normal.    Encounter Medications:   Outpatient Encounter Medications as of 11/26/2017  Medication Sig  . allopurinol (ZYLOPRIM) 100 MG tablet Take 100 mg by mouth daily.  Marland Kitchen amiodarone (PACERONE) 200 MG tablet Take 1 tablet (200 mg total) by mouth daily.  Marland Kitchen apixaban (ELIQUIS) 5 MG TABS tablet Take 1 tablet (5 mg total) by mouth 2 (two) times daily.  . Ascorbic Acid (VITAMIN C) 1000 MG tablet Take 1,000 mg by mouth daily.  Marland Kitchen atorvastatin (LIPITOR) 20 MG tablet Take 1 tablet (20 mg total) by mouth daily. (Patient taking differently: Take 40 mg by mouth daily at 6 PM. )  . carvedilol (COREG) 3.125 MG tablet Take 1 tablet (3.125 mg total) by mouth 2 (two) times daily with a meal.  . Cholecalciferol (VITAMIN D3) 2000 units TABS Take 2,000 Units by mouth 2 (two) times daily.   . furosemide (LASIX) 80 MG tablet Take 1 tablet (80 mg total) by mouth 2 (two) times daily.  Marland Kitchen gabapentin (NEURONTIN) 300 MG capsule Take 300 mg by mouth 3 (three) times daily.  . insulin aspart (NOVOLOG FLEXPEN) 100 UNIT/ML FlexPen inject 3 units if sugar is >151 ac meal; inject 2 units if sugar  is btween 110 & 150, Do not inject any insulin  if your sugar is < 110  . insulin glargine (LANTUS) 100 unit/mL SOPN Inject 0.1 mLs (10 Units total) into the skin daily.  . Lactobacillus (PROBIOTIC ACIDOPHILUS PO) Take 1 capsule by mouth 2 (two) times daily.  Marland Kitchen losartan (COZAAR) 50 MG tablet Take 1 tablet (50 mg total) by mouth daily.  . metolazone (ZAROXOLYN) 2.5 MG tablet TAKE 1 TABLET BY MOUTH 2 TIMES A WEEK ON Tuesday  AND Saturday 30 MINUTES BEFORE MORNING DOSE OF LASIX  . NON FORMULARY Needles, syringes with needles, glucose reagent test strips  . Omega-3 Fatty Acids (FISH OIL PO) Take 2  tablets by mouth 2 (two) times daily.   Marland Kitchen omeprazole (PRILOSEC) 40 MG capsule Take 1 capsule (40 mg total) by mouth daily. (Patient taking differently: Take 20 mg by mouth daily. )  . potassium chloride (K-DUR) 10 MEQ tablet Take 1 tablet (10 mEq total) by mouth daily.  . Saw Palmetto, Serenoa repens, (SAW PALMETTO PO) Take 1 tablet by mouth daily.   . sertraline (ZOLOFT) 100 MG tablet Take 100 mg by mouth daily.  . tamsulosin (FLOMAX) 0.4 MG CAPS capsule Take 0.4 mg by mouth daily.  . VENTOLIN HFA 108 (90 Base) MCG/ACT inhaler   . vitamin E 400 UNIT capsule Take 400 Units by mouth daily.   Marland Kitchen NITROSTAT 0.4 MG SL tablet Place 0.4 mg under the tongue every 5 (five) minutes as needed for chest pain (MAX 3 TABLETS).   . SSD 1 % cream Apply 1 application topically 2 (two) times daily.    No facility-administered encounter medications on file as of 11/26/2017.     Functional Status:   In your present state of health, do you have any difficulty performing the following activities: 11/26/2017 11/14/2017  Hearing? Y Y  Comment buzzing in ears, has hearing aids but does not use them -  Vision? Y N  Difficulty concentrating or making decisions? Y N  Comment reports short term memory loss -  Walking or climbing stairs? Y Y  Dressing or bathing? N N  Doing errands, shopping? Y N  Comment does not drive Facilities manager and eating ? N -  Using the Toilet? N -  In the past six months, have you accidently leaked urine? N -  Do you have problems with loss of bowel control? Y -  Managing your Medications? Y -  Comment reports insulin is expense.  -  Managing your Finances? N -  Housekeeping or managing your Housekeeping? N -  Some recent data might be hidden    Fall/Depression Screening:    Fall Risk  11/26/2017  Falls in the past year? 1  Number falls in past yr: 1  Injury with Fall? 0  Risk for fall due to : History of fall(s)  Follow up Falls evaluation completed   PHQ 2/9 Scores  11/26/2017  PHQ - 2 Score 0    Assessment:   (1) reviewed [facility] program.  Provided [facility] new patient welcome packet. Reviewed consent and consent signed. Provided 24 hour nurse line and my contact information.  (2) long history of heart failure:  Current weighing with full bladder in the mornings.  Reports following low salt diet. Reviewed weight log. Wife does not understand how to read labels. Recent admission for CHF with 20 pound weight loss while admitted.  (3)open area between right 4 and 5th toe. No signs of infection. Dry gauze applied  by wife. (4) history of falls. (5) DM: recent change in insulin. Patient and wife voice concerns about cost.    Plan:  (1) consent scanned into chart. Plan follow up on 12/09/2017.  Encouraged patient and or wife to call sooner if needed. (2) Reviewed how to weigh, first thing in the morning with any empty bladder. Reviewed with wife how to read labels. Provided low salt tear off page and reviewed importance of low salt diet.  Provided [facility] calendar and reviewed CHF zones and daily weight log. Provided living well with heart failure packet. Encouraged use of compression hose.  (3)  Reviewed signs of infection and encouraged daily foot checks. (4) Reviewed fall precautions and need of assistive devices for being unsteady on feet.  (5) Reviewed with wife plan for follow up call 12/09/2017. If patient continues to struggle with medications cost will refer to [place]. Also reviewed mail order pharmacy and how to set up. Encouraged wife and patient to call to see if this would lower medication cost.   Care planning and goal setting and primary goal is to avoid a readmission for heart failure. This note and barrier letter to be sent to primary MD.  [facility] CM Care Plan Problem One     Most Recent Value  Care Plan Problem One  Recent admission for fluid overload.   Role Documenting the Problem One  Care Management [place] for Problem One  Active   [facility] Long Term Goal   Patient will report no readmission for heart failure in the next 90 days.   [facility] Long Term Goal Start Date  11/26/17  Interventions for Problem One Long Term Goal  Home visit completed. Assessments completed. Reviewed heart failure and daily self management.   [facility] CM Short Term Goal #1   Patient will weigh daily and record in [facility] calendar for the next 30 days.   [facility] CM Short Term Goal #1 Start Date  11/26/17  Interventions for Short Term Goal #1  Reviewed importance of daily weights with empty bladder. Encouraged patient to keep heart failure appointment. Reviewed heart failure zones and when to call MD. Encouraged patient to call heart failure clinic for weight gain.   [facility] CM Short Term Goal #2   Patient and or wife will report understanding how to read labels regarding sodium and carbs in the next 30 days.  [facility] CM Short Term Goal #2 Start Date  11/26/17  Interventions for Short Term Goal #2  Provided low salt tear off poster and reviewed how to read labels. Reviewed labels in the home on oil and cereal and explained how to understand label. Refer wife to review how to read labels on the back of low salt poster.      Tomasa Rand, RN, BSN, CEN [facility] [employer] [phone]

## 2017-11-27 NOTE — Progress Notes (Signed)
Advanced Heart Failure Clinic Note   Referring Physician: Dr. Denton Bolton PCP: Nathaniel Dandy, NP PCP-Cardiologist: Nathaniel Martinique, MD  Nephrology: Dr [place] [facility]) HF: [place] (new)  HPI:  [place] is a 77 y.o. male with PMH of CKD stage III, DM II, HTN, HLD, h/o VT s/p ICD, chronic systolic CHF, COPD, PAF on Eliquis, and h/o CVA referred by Dr. Martinique to [facility] for further management of HF.   Patient is s/b [place] in 2001.   Admitted July 2017 with syncope and VT at [facility].  He was loaded with amiodarone.  A cardiac catheterization done at that time showed EF 20%, patent LIMA to LAD, patent SVG to RCA, occluded SVG to OM.  This was discussed with Dr.  Roxy [place] to see if patient need redo Bolton, however he was found not to be a good candidate for redo surgery.  His cath film was reviewed by Dr. Martinique, left circumflex artery had a 90% ostial stenosis, mid left circumflex on the OM spur occluded, however no good target for PCI.  He is in BEAT study and has a Barostim device.   Admitted 11/7 - 11/18/17 with 20 lb weight gain over 3 weeks. Diuresed with IV lasix and meds adjusted. Diuresis limited by AKI, but improved by discharge. Referred to HF clinic.  He presents today to establish in the HF clinic. Overall has been doing well since discharge. Weight at hom from 205 -> 192 from admission to now. Previously baseline weight was ~ 185, but this was three months ago. He denies SOB with ADLs. No lightheadedness or dizziness. Activities limited by SOB and knee arthritis. He uses a motorized cart at grocery stores and has SOB with inclines. He is most limited by knee arthritis. He denies CP, orthopnea, or PND. Wears his CPAP every night. He smoked from 1959 to 1991, and at one point smoked as many as 3-4 packs a day. (60-90 pack years). He states he had 2 MIs prior to his Bolton. Previously lived in [place]. Lives at home in [place] with his wife. Previously completed cardiac rehab  at [facility], but this was many years ago.   Echo 11/15/17 LVEF 20-25%, Mild/Mod MR, Mod LAE  E[facility] 04/2016 [facility]-30% [facility] Echo 02/2016 LVEF 30% Echo 10/2010 LVEF 25-30%  Review of systems complete and found to be negative unless listed in HPI.    Past Medical History:  Diagnosis Date  . CAD (coronary artery disease)   . CKD stage 3 due to type 2 diabetes mellitus ([place])   . Diabetes mellitus    Type 2  . Erectile dysfunction   . Gout   . Hyperlipidemia   . Hypertension   . ICD (implantable cardiac defibrillator) in place   . Myocardial infarction, old    x2 in [place]. S/P Bolton 2003, Dr [place]  . Obstructive lung disease (HCC)    Moderate per prior PFT's  . Paroxysmal atrial fibrillation ([place]) 3/17   discovered on event monitor  . Stroke [facility])    2013  . Systolic CHF, acute on chronic (HCC)    EF is 27% per myoview and echo October 2012  . Ventricular tachycardia ([place]) 08/2014   175 bpm   Current Outpatient Medications  Medication Sig Dispense Refill  . allopurinol (ZYLOPRIM) 100 MG tablet Take 100 mg by mouth daily.    Marland Kitchen amiodarone (PACERONE) 200 MG tablet Take 1 tablet (200 mg total) by mouth daily. [place]  tablet 6  . apixaban (ELIQUIS) 5 MG TABS tablet Take 1 tablet (5 mg total) by mouth 2 (two) times daily. 60 tablet 6  . Ascorbic Acid (VITAMIN C) 1000 MG tablet Take 1,000 mg by mouth daily.    Marland Kitchen atorvastatin (LIPITOR) 20 MG tablet Take 1 tablet (20 mg total) by mouth daily. (Patient taking differently: Take 40 mg by mouth daily at 6 PM. ) 30 tablet 6  . carvedilol (COREG) 3.125 MG tablet Take 1 tablet (3.125 mg total) by mouth 2 (two) times daily with a meal. 60 tablet 2  . Cholecalciferol (VITAMIN D3) 2000 units TABS Take 2,000 Units by mouth 2 (two) times daily.     . furosemide (LASIX) 80 MG tablet Take 1 tablet (80 mg total) by mouth 2 (two) times daily. 60 tablet 2  . gabapentin (NEURONTIN) 300 MG capsule Take 300 mg by mouth 3 (three) times  daily.    . insulin aspart (NOVOLOG FLEXPEN) 100 UNIT/ML FlexPen inject 3 units if sugar is >151 ac meal; inject 2 units if sugar is btween 110 & 150, Do not inject any insulin  if your sugar is < 110 15 mL 11  . insulin glargine (LANTUS) 100 unit/mL SOPN Inject 0.1 mLs (10 Units total) into the skin daily. 15 mL 11  . Lactobacillus (PROBIOTIC ACIDOPHILUS PO) Take 1 capsule by mouth 2 (two) times daily.    Marland Kitchen losartan (COZAAR) 50 MG tablet Take 1 tablet (50 mg total) by mouth daily. 30 tablet 2  . metolazone (ZAROXOLYN) 2.5 MG tablet TAKE 1 TABLET BY MOUTH 2 TIMES A WEEK ON Tuesday  AND Saturday 30 MINUTES BEFORE MORNING DOSE OF LASIX 15 tablet 2  . NITROSTAT 0.4 MG SL tablet Place 0.4 mg under the tongue every 5 (five) minutes as needed for chest pain (MAX 3 TABLETS).     . NON FORMULARY Needles, syringes with needles, glucose reagent test strips    . Omega-3 Fatty Acids (FISH OIL PO) Take 2 tablets by mouth 2 (two) times daily.     Marland Kitchen omeprazole (PRILOSEC) 40 MG capsule Take 1 capsule (40 mg total) by mouth daily. (Patient taking differently: Take 20 mg by mouth daily. ) 30 capsule 6  . potassium chloride (K-DUR) 10 MEQ tablet Take 1 tablet (10 mEq total) by mouth daily. 30 tablet 1  . Saw Palmetto, Serenoa repens, (SAW PALMETTO PO) Take 1 tablet by mouth daily.     . sertraline (ZOLOFT) 100 MG tablet Take 100 mg by mouth daily.    . tamsulosin (FLOMAX) 0.4 MG CAPS capsule Take 0.4 mg by mouth daily.    . VENTOLIN HFA 108 (90 Base) MCG/ACT inhaler   0  . vitamin E 400 UNIT capsule Take 400 Units by mouth daily.      No current facility-administered medications for this encounter.    Allergies  Allergen Reactions  . Adhesive [Tape] Itching and Rash    Testosterone patch adhesive   Social History   Socioeconomic History  . Marital status: Married    Spouse name: Not on file  . Number of children: 4  . Years of education: Not on file  . Highest education level: Not on file  Occupational  History  . Occupation: heavy Radio producer  Social Needs  . Financial resource strain: Not on file  . Food insecurity:    Worry: Not on file    Inability: Not on file  . Transportation needs:    Medical: Not on  file    Non-medical: Not on file  Tobacco Use  . Smoking status: Former Smoker    Last attempt to quit: 1991    Years since quitting: 28.9  . Smokeless tobacco: Never Used  Substance and Sexual Activity  . Alcohol use: No  . Drug use: No  . Sexual activity: Not on file  Lifestyle  . Physical activity:    Days per week: Not on file    Minutes per session: Not on file  . Stress: Not on file  Relationships  . Social connections:    Talks on phone: Not on file    Gets together: Not on file    Attends religious service: Not on file    Active member of club or organization: Not on file    Attends meetings of clubs or organizations: Not on file    Relationship status: Not on file  . Intimate partner violence:    Fear of current or ex partner: Not on file    Emotionally abused: Not on file    Physically abused: Not on file    Forced sexual activity: Not on file  Other Topics Concern  . Not on file  Social History Narrative  . Not on file    Family History  Problem Relation Age of Onset  . Heart failure Mother   . Heart attack Father   . Heart disease Sister        valve replaced  . Heart attack Brother        Bolton, aortic grafting   Vitals:   11/29/17 1035  BP: 136/70  Pulse: (!) 59  SpO2: 95%  Weight: 90.4 kg (199 lb 3.2 oz)    Wt Readings from Last 3 Encounters:  11/29/17 90.4 kg (199 lb 3.2 oz)  11/26/17 89.4 kg (197 lb)  11/18/17 90.2 kg (198 lb 14.4 oz)   PHYSICAL EXAM: General:  Elderly appearing. NAD. Walks slowly with cane HEENT: normal anicteric Neck: supple. JVP 7-8 cm. Carotids 2+ bilat; no bruits. No lymphadenopathy or thyromegaly appreciated. Cor: PMI nondisplaced. Regular rate & rhythm. No rubs, gallops or murmurs. Lungs:  Diminished breath sounds throughout. No wheeze Abdomen: soft, nontender, nondistended. No hepatosplenomegaly. No bruits or masses. Good bowel sounds. Extremities: no cyanosis, clubbing, or rash. Trace ankle edema.  Neuro: alert & oriented x 3, cranial nerves grossly intact. moves all 4 extremities w/o difficulty. Affect pleasan  ECG: 11/26/17 NSR 61 bpm with PVCs, QRS 100 ms, personally reviewed.   ASSESSMENT & PLAN:  1. Chronic systolic CHF, ICM - Echo 53/6/64 LVEF 20-25%, Mild/Mod MR, Mod LAE - He is in BEAT study and has barostim.  - NYHA III symptoms - Volume status looks OK on exam.   - Continue lasix 80 mg BID, for now. In the future, may need to consider switching to torsemide.  - Continue metolazone 2.5 mg twice weekly (Tue and Saturday) - Continue coreg 3.125 mg BID. Will not uptitrate with borderline bradycardia. - Continue losartan 50 mg daily.  - Add spiro 12.5 mg daily and follow BMET in 10 days.   - Reinforced fluid restriction to < 2 L daily, sodium restriction to less than 2000 mg daily, and the importance of daily weights.   - Not candidate for transplant with age, Outside candidate for VAD with CKD III-IV, COPD, and immobility. Will check PFTs, but expect that they will be prohibitive.    2. CAD s/p Bolton - 07/2015 Cath at [facility] showed EF 20%,  patent LIMA to LAD, patent SVG to RCA, occluded SVG to OM. Discussed with Dr.  Roxy Bolton to see if patient need redo Bolton, however not candidate. Cath film was reviewed by Dr. Martinique, left circumflex artery had a 90% ostial stenosis, mid left circumflex on the OM spur occluded, however no good target for PCI - No s/s of ischemia.     3. PAF - On Eliquis 5 mg BID. Denies bleeding.  - Regular on exam. EKG 11/26/17 with NSR.   4. CKD III - Baseline appears ~ 1.6. BMET today. - Follows with nephrology.   5. H/o CVA - On Eliquis as above.  - Has small, central visual deficit in R eye, but otherwise, no lasting deficit.   6.  DM2 - Per PCP. Hgb A1C 8.7 11/14/17  7. HTN - Meds as above.   8. OSA - Continue nightly CPAP.   9. H/o VT - Distant history as well as shock in September for VT, managed at [facility] so no records available surrounding this incident and patient isn't sure.  - Continue amiodarone 200 mg daily.  - It may have been due to worsening CAD, especially in setting of now worsening CHF symptoms, but with borderline CKD and non-candidacy for intervention, would not proceed with repeat cath at this time. No s/s of ischemia.   If recurs, will need to reconsider.   10. Deconditioning - With recent admit, and low EF, will refer back to Cardiac Rehab at [facility].   Meds and labs as above. Will send for PFTs to assess lung function with eye towards   [name], PA-C 11/29/17   Patient seen and examined with the above-signed Advanced Practice Provider and/or Housestaff. I personally reviewed laboratory data, imaging studies and relevant notes. I independently examined the patient and formulated the important aspects of the plan. I have edited the note to reflect any of my changes or salient points. I have personally discussed the plan with the patient and/or family.  77 y/o male with multiple medical problems including COPD. CKD III, OSA, CVA, CAD s/p Bolton and systolic HF EF 67% (s/p ICD) referred for further HF management. Recently discharged from the hospital. Feels better after diuresis. NYHA III. Limited by dyspnea and knee arthritis. Appears frail on exam. No volume overload. No s3.   Overall appears well compensated. Agree with addition of spiro. Reinforced need for daily weights and reviewed use of sliding scale diuretics. Given fraility and comorbidities, he is not VAD candidate.   Will follow in HF Clinic.   Glori Bickers, MD  9:12 PM

## 2017-11-28 DIAGNOSIS — J4540 Moderate persistent asthma, uncomplicated: Secondary | ICD-10-CM | POA: Diagnosis not present

## 2017-11-28 DIAGNOSIS — R5383 Other fatigue: Secondary | ICD-10-CM | POA: Diagnosis not present

## 2017-11-28 DIAGNOSIS — G4733 Obstructive sleep apnea (adult) (pediatric): Secondary | ICD-10-CM | POA: Diagnosis not present

## 2017-11-29 ENCOUNTER — Encounter (HOSPITAL_COMMUNITY): Payer: Self-pay

## 2017-11-29 ENCOUNTER — Ambulatory Visit (HOSPITAL_COMMUNITY)
Admit: 2017-11-29 | Discharge: 2017-11-29 | Disposition: A | Discharge status: Home / Self Care | Payer: Medicare HMO | Attending: Internal Medicine | Admitting: Internal Medicine

## 2017-11-29 VITALS — BP 136/70 | HR 59 | Wt 199.2 lb

## 2017-11-29 DIAGNOSIS — E785 Hyperlipidemia, unspecified: Secondary | ICD-10-CM | POA: Diagnosis not present

## 2017-11-29 DIAGNOSIS — I509 Heart failure, unspecified: Secondary | ICD-10-CM

## 2017-11-29 DIAGNOSIS — N183 Chronic kidney disease, stage 3 unspecified: Secondary | ICD-10-CM

## 2017-11-29 DIAGNOSIS — E1122 Type 2 diabetes mellitus with diabetic chronic kidney disease: Secondary | ICD-10-CM | POA: Diagnosis not present

## 2017-11-29 DIAGNOSIS — M109 Gout, unspecified: Secondary | ICD-10-CM | POA: Diagnosis not present

## 2017-11-29 DIAGNOSIS — Z7901 Long term (current) use of anticoagulants: Secondary | ICD-10-CM | POA: Insufficient documentation

## 2017-11-29 DIAGNOSIS — Z006 Encounter for examination for normal comparison and control in clinical research program: Secondary | ICD-10-CM

## 2017-11-29 DIAGNOSIS — Z95810 Presence of automatic (implantable) cardiac defibrillator: Secondary | ICD-10-CM | POA: Diagnosis not present

## 2017-11-29 DIAGNOSIS — I25810 Atherosclerosis of coronary artery bypass graft(s) without angina pectoris: Secondary | ICD-10-CM | POA: Diagnosis not present

## 2017-11-29 DIAGNOSIS — J449 Chronic obstructive pulmonary disease, unspecified: Secondary | ICD-10-CM | POA: Insufficient documentation

## 2017-11-29 DIAGNOSIS — G4733 Obstructive sleep apnea (adult) (pediatric): Secondary | ICD-10-CM | POA: Diagnosis not present

## 2017-11-29 DIAGNOSIS — Z794 Long term (current) use of insulin: Secondary | ICD-10-CM | POA: Diagnosis not present

## 2017-11-29 DIAGNOSIS — I2510 Atherosclerotic heart disease of native coronary artery without angina pectoris: Secondary | ICD-10-CM | POA: Diagnosis not present

## 2017-11-29 DIAGNOSIS — I252 Old myocardial infarction: Secondary | ICD-10-CM | POA: Diagnosis not present

## 2017-11-29 DIAGNOSIS — I5022 Chronic systolic (congestive) heart failure: Secondary | ICD-10-CM | POA: Diagnosis not present

## 2017-11-29 DIAGNOSIS — I10 Essential (primary) hypertension: Secondary | ICD-10-CM

## 2017-11-29 DIAGNOSIS — I130 Hypertensive heart and chronic kidney disease with heart failure and stage 1 through stage 4 chronic kidney disease, or unspecified chronic kidney disease: Secondary | ICD-10-CM

## 2017-11-29 DIAGNOSIS — Z951 Presence of aortocoronary bypass graft: Secondary | ICD-10-CM | POA: Diagnosis not present

## 2017-11-29 DIAGNOSIS — R5381 Other malaise: Secondary | ICD-10-CM

## 2017-11-29 DIAGNOSIS — Z79899 Other long term (current) drug therapy: Secondary | ICD-10-CM | POA: Insufficient documentation

## 2017-11-29 DIAGNOSIS — I480 Paroxysmal atrial fibrillation: Secondary | ICD-10-CM | POA: Insufficient documentation

## 2017-11-29 DIAGNOSIS — Z87891 Personal history of nicotine dependence: Secondary | ICD-10-CM | POA: Diagnosis not present

## 2017-11-29 DIAGNOSIS — N529 Male erectile dysfunction, unspecified: Secondary | ICD-10-CM | POA: Insufficient documentation

## 2017-11-29 DIAGNOSIS — E119 Type 2 diabetes mellitus without complications: Secondary | ICD-10-CM

## 2017-11-29 DIAGNOSIS — Z8673 Personal history of transient ischemic attack (TIA), and cerebral infarction without residual deficits: Secondary | ICD-10-CM | POA: Insufficient documentation

## 2017-11-29 DIAGNOSIS — Z8249 Family history of ischemic heart disease and other diseases of the circulatory system: Secondary | ICD-10-CM | POA: Insufficient documentation

## 2017-11-29 MED ORDER — SPIRONOLACTONE 25 MG PO TABS: 12.5000 mg | ORAL_TABLET | Freq: Every day | ORAL | 3 refills | Status: DC

## 2017-11-29 NOTE — Patient Instructions (Addendum)
You have been referred to Electrophysiology to reestablish care at the device clinic. They will contact you to up an appointment.  Your physician has recommended that you have a pulmonary function test. Pulmonary Function Tests are a group of tests that measure how well air moves in and out of your lungs.  You have been referred to Cardiac Rehab in [place]. They will call you to set up an appointment.  START spironalactone 12.5mg  (0.5 tabs) every evening.  Follow up with the Advance Practice Provider in 6 weeks.

## 2017-12-02 ENCOUNTER — Ambulatory Visit (HOSPITAL_COMMUNITY)
Admission: RE | Admit: 2017-12-02 | Discharge: 2017-12-02 | Disposition: A | Discharge status: Home / Self Care | Payer: Medicare HMO | Source: Ambulatory Visit | Attending: Student | Admitting: Student

## 2017-12-02 DIAGNOSIS — I509 Heart failure, unspecified: Secondary | ICD-10-CM | POA: Diagnosis not present

## 2017-12-02 LAB — PULMONARY FUNCTION TEST
DL/VA % PRED: 69 %
DL/VA: 3.19 ml/min/mmHg/L
DLCO COR % PRED: 50 %
DLCO cor: 16.44 ml/min/mmHg
DLCO unc % pred: 45 %
DLCO unc: 14.86 ml/min/mmHg
FEF 25-75 POST: 1.44 L/s
FEF 25-75 Pre: 1.23 L/sec
FEF2575-%Change-Post: 17 %
FEF2575-%PRED-POST: 66 %
FEF2575-%Pred-Pre: 56 %
FEV1-%Change-Post: 4 %
FEV1-%Pred-Post: 65 %
FEV1-%Pred-Pre: 62 %
FEV1-Post: 1.99 L
FEV1-Pre: 1.9 L
FEV1FVC-%Change-Post: -2 %
FEV1FVC-%PRED-PRE: 97 %
FEV6-%Change-Post: 6 %
FEV6-%Pred-Post: 71 %
FEV6-%Pred-Pre: 67 %
FEV6-POST: 2.82 L
FEV6-Pre: 2.66 L
FEV6FVC-%Change-Post: -1 %
FEV6FVC-%PRED-POST: 104 %
FEV6FVC-%Pred-Pre: 105 %
FVC-%CHANGE-POST: 7 %
FVC-%PRED-POST: 68 %
FVC-%PRED-PRE: 64 %
FVC-POST: 2.88 L
FVC-Pre: 2.69 L
Post FEV1/FVC ratio: 69 %
Post FEV6/FVC ratio: 98 %
Pre FEV1/FVC ratio: 71 %
Pre FEV6/FVC Ratio: 99 %
RV % pred: 128 %
RV: 3.31 L
TLC % pred: 85 %
TLC: 6.04 L

## 2017-12-02 MED ORDER — ALBUTEROL SULFATE (2.5 MG/3ML) 0.083% IN NEBU
2.5000 mg | INHALATION_SOLUTION | Freq: Once | RESPIRATORY_TRACT | Status: AC
  Administered 2017-12-02: 2.5 mg via RESPIRATORY_TRACT

## 2017-12-03 DIAGNOSIS — G4733 Obstructive sleep apnea (adult) (pediatric): Secondary | ICD-10-CM | POA: Diagnosis not present

## 2017-12-04 DIAGNOSIS — I509 Heart failure, unspecified: Secondary | ICD-10-CM | POA: Diagnosis not present

## 2017-12-04 DIAGNOSIS — N189 Chronic kidney disease, unspecified: Secondary | ICD-10-CM | POA: Diagnosis not present

## 2017-12-04 DIAGNOSIS — E782 Mixed hyperlipidemia: Secondary | ICD-10-CM | POA: Diagnosis not present

## 2017-12-04 DIAGNOSIS — E1169 Type 2 diabetes mellitus with other specified complication: Secondary | ICD-10-CM | POA: Diagnosis not present

## 2017-12-04 DIAGNOSIS — I10 Essential (primary) hypertension: Secondary | ICD-10-CM | POA: Diagnosis not present

## 2017-12-09 ENCOUNTER — Other Ambulatory Visit: Payer: Self-pay

## 2017-12-09 NOTE — Patient Outreach (Signed)
Telephone outreach:  Placed call to patient/wife to follow up on heart failure clinic visit and nephrology. Wife reports that patient is doing well. Weight up and down 1 pound or so pre wife. She reports that patient continues to weigh daily and follow low salt diet. Reports CBG's slightly higher and reports patient is using his sliding scale. Reports that [place] is going to provide patients insulin so patient is very happy about that.  Wife states sore between toes is well healed.    No new concerns today.  PLAN: Follow up call in 7- 10 days.  Tomasa Rand, RN, BSN, CEN [facility] [employer] [phone]

## 2017-12-10 ENCOUNTER — Encounter: Payer: Medicare HMO | Admitting: *Deleted

## 2017-12-10 ENCOUNTER — Telehealth (HOSPITAL_COMMUNITY): Payer: Self-pay | Admitting: *Deleted

## 2017-12-10 ENCOUNTER — Ambulatory Visit (HOSPITAL_COMMUNITY)
Admission: RE | Admit: 2017-12-10 | Discharge: 2017-12-10 | Disposition: A | Discharge status: Home / Self Care | Payer: Medicare HMO | Source: Ambulatory Visit | Attending: Cardiology | Admitting: Cardiology

## 2017-12-10 VITALS — BP 126/54 | HR 55 | Resp 12 | Wt 199.2 lb

## 2017-12-10 DIAGNOSIS — I509 Heart failure, unspecified: Secondary | ICD-10-CM | POA: Diagnosis not present

## 2017-12-10 DIAGNOSIS — Z006 Encounter for examination for normal comparison and control in clinical research program: Secondary | ICD-10-CM

## 2017-12-10 LAB — BASIC METABOLIC PANEL
ANION GAP: 13 (ref 5–15)
BUN: 72 mg/dL — AB (ref 8–23)
CO2: 31 mmol/L (ref 22–32)
Calcium: 9 mg/dL (ref 8.9–10.3)
Chloride: 84 mmol/L — ABNORMAL LOW (ref 98–111)
Creatinine, Ser: 2.37 mg/dL — ABNORMAL HIGH (ref 0.61–1.24)
GFR calc Af Amer: 30 mL/min — ABNORMAL LOW (ref >60)
GFR calc non Af Amer: 26 mL/min — ABNORMAL LOW (ref >60)
Glucose, Bld: 143 mg/dL — ABNORMAL HIGH (ref 70–99)
POTASSIUM: 2.7 mmol/L — AB (ref 3.5–5.1)
SODIUM: 128 mmol/L — AB (ref 135–145)

## 2017-12-10 NOTE — Telephone Encounter (Signed)
Lab called with critical K 2.6. Dr.[name] notified.

## 2017-12-10 NOTE — Research (Signed)
Beat 21 month appointment  Pt and wife here today for his device check at 21 month. He is doing fairly well. Med changes is he has started Spironolactone 12.5 mg daily and Furosamide was increased to 80 mg BID.    Will see patient back in February 18, 2018 at 9 am

## 2017-12-11 ENCOUNTER — Encounter: Payer: Self-pay | Admitting: Internal Medicine

## 2017-12-11 ENCOUNTER — Other Ambulatory Visit: Payer: Self-pay

## 2017-12-11 ENCOUNTER — Ambulatory Visit (INDEPENDENT_AMBULATORY_CARE_PROVIDER_SITE_OTHER): Payer: Medicare HMO | Admitting: Internal Medicine

## 2017-12-11 VITALS — BP 122/54 | HR 145 | Ht 70.0 in | Wt 196.8 lb

## 2017-12-11 DIAGNOSIS — I10 Essential (primary) hypertension: Secondary | ICD-10-CM

## 2017-12-11 DIAGNOSIS — I509 Heart failure, unspecified: Secondary | ICD-10-CM

## 2017-12-11 DIAGNOSIS — I472 Ventricular tachycardia, unspecified: Secondary | ICD-10-CM

## 2017-12-11 DIAGNOSIS — Z95810 Presence of automatic (implantable) cardiac defibrillator: Secondary | ICD-10-CM

## 2017-12-11 DIAGNOSIS — I5022 Chronic systolic (congestive) heart failure: Secondary | ICD-10-CM | POA: Diagnosis not present

## 2017-12-11 NOTE — Telephone Encounter (Signed)
Per Dr Rayann Heman, pt should take an additional 30mEq of Potassium today and hold his Metolazone until the [facility] has addressed his labs. Pt's wife is currently on hold with the [facility] but has verbalized understanding.

## 2017-12-11 NOTE — Patient Instructions (Signed)
Medication Instructions:  Your physician recommends that you continue on your current medications as directed. Please refer to the Current Medication list given to you today.  Labwork: None ordered.  Testing/Procedures: None ordered.  Follow-Up: Your physician recommends that you schedule a follow-up appointment in:    ICM Clinic with Sharman Cheek, RN  12 months with Chanetta Marshall, NP   Any Other Special Instructions Will Be Listed Below (If Applicable).     If you need a refill on your cardiac medications before your next appointment, please call your pharmacy.

## 2017-12-11 NOTE — Progress Notes (Signed)
PCP: [name], NP Primary Cardiologist: Dr Martinique CHF: [name] Primary EP: Dr Cherlyn Cushing is a 77 y.o. male who presents today for routine electrophysiology followup.  Since last being seen in our clinic, the patient reports doing very well. He is s/p Barostim device implant.  He is doing well with this.  He was recently hospitalized with CHF.  He is now followed by CHF. Today, he denies symptoms of palpitations, chest pain, lower extremity edema, dizziness, presyncope, syncope, or ICD shocks.  The patient is otherwise without complaint today.   Past Medical History:  Diagnosis Date  . CAD (coronary artery disease)   . CKD stage 3 due to type 2 diabetes mellitus ([place])   . Diabetes mellitus    Type 2  . Erectile dysfunction   . Gout   . Hyperlipidemia   . Hypertension   . ICD (implantable cardiac defibrillator) in place   . Myocardial infarction, old    x2 in [place]. S/P CABG 2003, Dr Roxy Manns  . Obstructive lung disease (HCC)    Moderate per prior PFT's  . Paroxysmal atrial fibrillation ([place]) 3/17   discovered on event monitor  . Stroke [facility])    2013  . Systolic CHF, acute on chronic (HCC)    EF is 27% per myoview and echo October 2012  . Ventricular tachycardia ([place]) 08/2014   175 bpm   Past Surgical History:  Procedure Laterality Date  . BYPASS GRAFT     x5  . CARDIAC DEFIBRILLATOR PLACEMENT  2008   [facility] by Dr Elonda Husky at [facility]  . cataract surgery    . CHOLECYSTECTOMY    . COLONOSCOPY  12/23/2006   Small colonic polyps, status post polypectomy. Interanl hemorrhoids.   . CORONARY ARTERY BYPASS GRAFT    . EYE SURGERY    . HERNIA REPAIR    . ICD GENERATOR CHANGEOUT N/A 08/30/2016   MDT Visia AF VR ICD implanted by Dr Rayann Heman  . KNEE ARTHROSCOPY     right  . UMBILICAL HERNIA REPAIR      ROS- all systems are reviewed and negative except as per HPI above  Current Outpatient Medications  Medication Sig Dispense Refill  .  allopurinol (ZYLOPRIM) 100 MG tablet Take 100 mg by mouth daily.    Marland Kitchen amiodarone (PACERONE) 200 MG tablet Take 1 tablet (200 mg total) by mouth daily. 30 tablet 6  . apixaban (ELIQUIS) 5 MG TABS tablet Take 1 tablet (5 mg total) by mouth 2 (two) times daily. 60 tablet 6  . Ascorbic Acid (VITAMIN C) 1000 MG tablet Take 1,000 mg by mouth daily.    Marland Kitchen atorvastatin (LIPITOR) 20 MG tablet Take 1 tablet (20 mg total) by mouth daily. (Patient taking differently: Take 40 mg by mouth daily at 6 PM. ) 30 tablet 6  . carvedilol (COREG) 3.125 MG tablet Take 1 tablet (3.125 mg total) by mouth 2 (two) times daily with a meal. 60 tablet 2  . Cholecalciferol (VITAMIN D3) 2000 units TABS Take 2,000 Units by mouth 2 (two) times daily.     . furosemide (LASIX) 80 MG tablet Take 1 tablet (80 mg total) by mouth 2 (two) times daily. 60 tablet 2  . gabapentin (NEURONTIN) 300 MG capsule Take 300 mg by mouth 3 (three) times daily.    . insulin aspart (NOVOLOG FLEXPEN) 100 UNIT/ML FlexPen inject 3 units if sugar is >151 ac meal; inject 2 units if sugar is btween  110 & 150, Do not inject any insulin  if your sugar is < 110 15 mL 11  . insulin glargine (LANTUS) 100 unit/mL SOPN Inject 0.1 mLs (10 Units total) into the skin daily. 15 mL 11  . Lactobacillus (PROBIOTIC ACIDOPHILUS PO) Take 1 capsule by mouth 2 (two) times daily.    Marland Kitchen losartan (COZAAR) 50 MG tablet Take 1 tablet (50 mg total) by mouth daily. 30 tablet 2  . metolazone (ZAROXOLYN) 2.5 MG tablet TAKE 1 TABLET BY MOUTH 2 TIMES A WEEK ON Tuesday  AND Saturday 30 MINUTES BEFORE MORNING DOSE OF LASIX 15 tablet 2  . NITROSTAT 0.4 MG SL tablet Place 0.4 mg under the tongue every 5 (five) minutes as needed for chest pain (MAX 3 TABLETS).     . NON FORMULARY Needles, syringes with needles, glucose reagent test strips    . Omega-3 Fatty Acids (FISH OIL PO) Take 2 tablets by mouth 2 (two) times daily.     Marland Kitchen omeprazole (PRILOSEC) 40 MG capsule Take 1 capsule (40 mg total) by  mouth daily. (Patient taking differently: Take 20 mg by mouth daily. ) 30 capsule 6  . potassium chloride (K-DUR) 10 MEQ tablet Take 1 tablet (10 mEq total) by mouth daily. 30 tablet 1  . Saw Palmetto, Serenoa repens, (SAW PALMETTO PO) Take 1 tablet by mouth daily.     . sertraline (ZOLOFT) 100 MG tablet Take 100 mg by mouth daily.    Marland Kitchen spironolactone (ALDACTONE) 25 MG tablet Take 0.5 tablets (12.5 mg total) by mouth at bedtime. 90 tablet 3  . tamsulosin (FLOMAX) 0.4 MG CAPS capsule Take 0.4 mg by mouth daily.    . VENTOLIN HFA 108 (90 Base) MCG/ACT inhaler   0  . vitamin E 400 UNIT capsule Take 400 Units by mouth daily.      No current facility-administered medications for this visit.     Physical Exam: Vitals:   12/11/17 1529  BP: (!) 122/54  Pulse: (!) 145  SpO2: 99%  Weight: 196 lb 12.8 oz (89.3 kg)  Height: 5\' 10"  (1.778 m)    GEN- The patient is well appearing, alert and oriented x 3 today.   Head- normocephalic, atraumatic Eyes-  Sclera clear, conjunctiva pink Ears- hearing intact Oropharynx- clear Lungs- Clear to ausculation bilaterally, normal work of breathing Chest- ICD pocket is well healed Heart- Regular rate and rhythm, no murmurs, rubs or gallops, PMI not laterally displaced GI- soft, NT, ND, + BS Extremities- no clubbing, cyanosis, or edema  ICD interrogation- reviewed in detail today,  See PACEART report  ekg tracing ordered today is personally reviewed and shows sinus bradycardia with PVCs, Barostim artifact limits interpretation  Wt Readings from Last 3 Encounters:  12/11/17 196 lb 12.8 oz (89.3 kg)  12/10/17 199 lb 3.2 oz (90.4 kg)  12/09/17 192 lb (87.1 kg)    Assessment and Plan:  1.  Chronic systolic dysfunction/ ischemic CM/ CAD Appears dry today Labs from yesterday reviewed.  Will defer plan to CHF clinic, though I suspect that he will need to stop metolazone No ischemic symptoms Stable on an appropriate medical regimen Normal ICD  function See Pace Art report No changes today He wishes to folllow in ICM device clinic S/p Barostim device  2. VT Well controlled with amiodarone. Labs 11/18 reviewed   3. HTN Stable No change required today  4. Paroxysmal atrial fibrillation No AF since ICD implant On eliquis  [facility] Return to see EP NP every year  Follow-up in CHF clinic  Thompson Grayer MD, [facility] 12/11/2017 3:33 PM

## 2017-12-12 ENCOUNTER — Other Ambulatory Visit (HOSPITAL_COMMUNITY): Payer: Self-pay

## 2017-12-12 MED ORDER — POTASSIUM CHLORIDE ER 10 MEQ PO TBCR: EXTENDED_RELEASE_TABLET | ORAL | 1 refills | Status: DC

## 2017-12-12 NOTE — Telephone Encounter (Signed)
Follow Up:    Patient wife calling concerning her husband refill. She states the pharmacy states that they have not received a fax concerning the patient concerns.

## 2017-12-12 NOTE — Telephone Encounter (Signed)
We have been trying to contact patient to address labs but no answer/ no voicemail set up. Called patient again this morning no answer. Please see note below.    Notes recorded by Kerry Dory, CMA on 12/11/2017 at 11:55 AM EST Patient scheduled for followup with Dr Rayann Heman today, message sent to Dr Bonita Quin nurse [place] [facility]) If patient shows for appt, will address labs and or have patient return call to [facility] ------  Notes recorded by Kerry Dory, CMA on 12/11/2017 at 11:21 AM EST Unable to reach patient. Mailbox is full unable to leave message  [phone] (H) ------  Notes recorded by Kerry Dory, CMA on 12/11/2017 at 11:20 AM EST Left message for patient to call back. [phone] (H) ------  Notes recorded by Kerry Dory, CMA on 12/11/2017 at 11:19 AM EST Unable to reach patient.no answer unable to leave message. Will attempt later Message to provider as [phone] (H) ------  Notes recorded by Kerry Dory, CMA on 12/10/2017 at 11:09 AM EST Unable to reach patient. No answer, unable to leave message  [phone] (H) ------  Notes recorded by Harvie Junior, CMA on 12/10/2017 at 10:59 AM EST Called pt no answer/no voicemail. Will try patient again later. ------  Notes recorded by [name], PA-C on 12/10/2017 at 10:56 AM EST Discussed with Dr. Haroldine Laws   Hold lasix today. If he already took this am, please hold tomorrow am (for total of two doses). Do NOT take metolazone today unless he already has.   Change metolazone to ONCE weekly, and do not take again until next Wednesday.   Please have hime take 60 meq of K x 2 today (total of 120) and then increase dose to 10 meq BID.    Needs repeat labs Monday, 12/16/17. OK to liberalize fluid intake today, as he is dehydrated by labwork.   Please call me with any questions regarding instructions. Thank you!   Legrand Como [id number] [place], PA-C 12/10/2017  10:56 AM

## 2017-12-12 NOTE — Addendum Note (Signed)
Addended by: Rose Phi on: 12/12/2017 01:07 PM   Modules accepted: Orders

## 2017-12-13 ENCOUNTER — Other Ambulatory Visit: Payer: Self-pay

## 2017-12-13 ENCOUNTER — Other Ambulatory Visit (HOSPITAL_COMMUNITY): Payer: Self-pay | Admitting: Cardiology

## 2017-12-13 MED ORDER — POTASSIUM CHLORIDE ER 10 MEQ PO TBCR: EXTENDED_RELEASE_TABLET | ORAL | 1 refills | Status: DC

## 2017-12-13 NOTE — Telephone Encounter (Signed)
Patient's wife called stating the pharmacy told her they haven't received the prescription yet.

## 2017-12-13 NOTE — Patient Outreach (Signed)
Telephone call:   Voicemail received from wife who request a call back.  Returned call and wife states that patient was told he has a low potassium. Wife reports patient was dehydrated and needed to take 44meq of potassium for 2 days.  Wife states RX never called in.  Wife reports that she had [facility] Wife states prescription was being transferred to [facility] on [address] in [place]. Wife reports that with that prescription she can give patient the correct dose of potassium. Wife states that patient will have additional labs on Monday.  Reviewed with wife to use what she has and after labs on Monday to get a new prescription.  Wife voiced understanding and she will call back if she had additional problems with getting potassium.  Tomasa Rand, RN, BSN, CEN [facility] [employer] [phone]

## 2017-12-13 NOTE — Telephone Encounter (Signed)
Another script has been sent to the pharmacy  Patients wife aware

## 2017-12-16 ENCOUNTER — Ambulatory Visit (HOSPITAL_COMMUNITY)
Admission: RE | Admit: 2017-12-16 | Discharge: 2017-12-16 | Disposition: A | Discharge status: Home / Self Care | Payer: Medicare HMO | Source: Ambulatory Visit | Attending: Cardiology | Admitting: Cardiology

## 2017-12-16 DIAGNOSIS — I509 Heart failure, unspecified: Secondary | ICD-10-CM | POA: Diagnosis not present

## 2017-12-16 LAB — BASIC METABOLIC PANEL
Anion gap: 11 (ref 5–15)
BUN: 45 mg/dL — AB (ref 8–23)
CO2: 28 mmol/L (ref 22–32)
CREATININE: 1.93 mg/dL — AB (ref 0.61–1.24)
Calcium: 9 mg/dL (ref 8.9–10.3)
Chloride: 91 mmol/L — ABNORMAL LOW (ref 98–111)
GFR calc Af Amer: 38 mL/min — ABNORMAL LOW (ref >60)
GFR, EST NON AFRICAN AMERICAN: 33 mL/min — AB (ref >60)
GLUCOSE: 89 mg/dL (ref 70–99)
Potassium: 4.4 mmol/L (ref 3.5–5.1)
Sodium: 130 mmol/L — ABNORMAL LOW (ref 135–145)

## 2017-12-20 ENCOUNTER — Other Ambulatory Visit (HOSPITAL_COMMUNITY): Payer: Self-pay | Admitting: Internal Medicine

## 2017-12-20 ENCOUNTER — Other Ambulatory Visit: Payer: Self-pay

## 2017-12-20 DIAGNOSIS — E1129 Type 2 diabetes mellitus with other diabetic kidney complication: Secondary | ICD-10-CM | POA: Diagnosis not present

## 2017-12-20 DIAGNOSIS — N189 Chronic kidney disease, unspecified: Secondary | ICD-10-CM | POA: Diagnosis not present

## 2017-12-20 DIAGNOSIS — I10 Essential (primary) hypertension: Secondary | ICD-10-CM | POA: Diagnosis not present

## 2017-12-20 DIAGNOSIS — Z6830 Body mass index (BMI) 30.0-30.9, adult: Secondary | ICD-10-CM | POA: Diagnosis not present

## 2017-12-20 DIAGNOSIS — I509 Heart failure, unspecified: Secondary | ICD-10-CM | POA: Diagnosis not present

## 2017-12-20 NOTE — Patient Outreach (Signed)
Telephone assessment:  Placed call to patient and spoke with wife, who reports that patient is doing well. Reports repeat labs and potassium in normal range. Wife reports weight jumped up about 7 pounds but Lasix was restarted and now weight is back down. Wife denies any swelling at this time. Reports she has all the refills she needs at this time .  PLAN:   Follow up call in 2 weeks. Encouraged wife to call sooner if needed.   Tomasa Rand, RN, BSN, CEN [facility] [employer] [phone]

## 2017-12-30 DIAGNOSIS — J42 Unspecified chronic bronchitis: Secondary | ICD-10-CM | POA: Diagnosis not present

## 2017-12-30 DIAGNOSIS — J069 Acute upper respiratory infection, unspecified: Secondary | ICD-10-CM | POA: Diagnosis not present

## 2017-12-30 DIAGNOSIS — Z2829 Immunization not carried out because of patient decision for other reason: Secondary | ICD-10-CM | POA: Diagnosis not present

## 2018-01-02 DIAGNOSIS — G4733 Obstructive sleep apnea (adult) (pediatric): Secondary | ICD-10-CM | POA: Diagnosis not present

## 2018-01-03 ENCOUNTER — Other Ambulatory Visit: Payer: Self-pay

## 2018-01-03 NOTE — Patient Outreach (Signed)
Telephone assessment:  Vitals:   01/03/18 1257  Weight: 193 lb (87.5 kg)   Placed call to patient to follow up. Wife reports that patient is doing well. Reports weight is up and down. 192 pounds yesterday and 193 today. Wife reports chest congestion and MD put patient on antibiotic. Wife reports normal amounts of swelling. Denies any other concerns today.  PLAN: will call back in 30 days. Encouraged wife to call sooner if needed. Encouraged daily weights and to report any changes in condition to MD.  Tomasa Rand, RN, BSN, CEN [place] Coordinator [phone]

## 2018-01-07 ENCOUNTER — Telehealth: Payer: Self-pay

## 2018-01-07 NOTE — Telephone Encounter (Signed)
Received Carelink transmission and shows fluid accumulation since ~12/10/2017.  Patient has a respiratory infection with a cough and taking antibiotics for the last 5 days.  Weight is stable and has occasional shortness of breath.  His symptoms are no worse now than in the last 2 weeks.  Advised patient has appointment with HF clinic in 2 days, 01/09/2018 and will be checked for fluid accumulation as well as physical evaluation.  Advised if symptoms become urgent to use ER if needed.  Will schedule ICM remote transmission 02/03/2018.

## 2018-01-07 NOTE — Telephone Encounter (Signed)
Referred to ICM clinic by Dr Rayann Heman.   Patient was previously in [facility] clinic before transferring to [place].  He has transferred back to [facility] device clinic.  Spoke with wife and she would like to resume monthly ICM follow up for patient.  She reported patient currently has some congestion and would like to check a report to see if he has any fluid accumulation.  Advised she can send one today or Thursday. She will try to send today.

## 2018-01-09 ENCOUNTER — Ambulatory Visit (HOSPITAL_COMMUNITY)
Admission: RE | Admit: 2018-01-09 | Discharge: 2018-01-09 | Disposition: A | Discharge status: Home / Self Care | Payer: Medicare HMO | Source: Ambulatory Visit | Attending: Cardiology | Admitting: Cardiology

## 2018-01-09 ENCOUNTER — Encounter (HOSPITAL_COMMUNITY): Payer: Self-pay

## 2018-01-09 VITALS — BP 152/76 | HR 68 | Wt 192.8 lb

## 2018-01-09 DIAGNOSIS — I509 Heart failure, unspecified: Secondary | ICD-10-CM

## 2018-01-09 DIAGNOSIS — I480 Paroxysmal atrial fibrillation: Secondary | ICD-10-CM

## 2018-01-09 DIAGNOSIS — E119 Type 2 diabetes mellitus without complications: Secondary | ICD-10-CM

## 2018-01-09 DIAGNOSIS — I2510 Atherosclerotic heart disease of native coronary artery without angina pectoris: Secondary | ICD-10-CM | POA: Diagnosis not present

## 2018-01-09 DIAGNOSIS — I25810 Atherosclerosis of coronary artery bypass graft(s) without angina pectoris: Secondary | ICD-10-CM | POA: Diagnosis not present

## 2018-01-09 DIAGNOSIS — I252 Old myocardial infarction: Secondary | ICD-10-CM | POA: Insufficient documentation

## 2018-01-09 DIAGNOSIS — Z79899 Other long term (current) drug therapy: Secondary | ICD-10-CM | POA: Insufficient documentation

## 2018-01-09 DIAGNOSIS — E785 Hyperlipidemia, unspecified: Secondary | ICD-10-CM | POA: Insufficient documentation

## 2018-01-09 DIAGNOSIS — M109 Gout, unspecified: Secondary | ICD-10-CM | POA: Insufficient documentation

## 2018-01-09 DIAGNOSIS — Z95810 Presence of automatic (implantable) cardiac defibrillator: Secondary | ICD-10-CM | POA: Diagnosis not present

## 2018-01-09 DIAGNOSIS — N529 Male erectile dysfunction, unspecified: Secondary | ICD-10-CM | POA: Diagnosis not present

## 2018-01-09 DIAGNOSIS — Z7901 Long term (current) use of anticoagulants: Secondary | ICD-10-CM | POA: Diagnosis not present

## 2018-01-09 DIAGNOSIS — I10 Essential (primary) hypertension: Secondary | ICD-10-CM | POA: Diagnosis not present

## 2018-01-09 DIAGNOSIS — J449 Chronic obstructive pulmonary disease, unspecified: Secondary | ICD-10-CM | POA: Insufficient documentation

## 2018-01-09 DIAGNOSIS — I69398 Other sequelae of cerebral infarction: Secondary | ICD-10-CM | POA: Insufficient documentation

## 2018-01-09 DIAGNOSIS — G4733 Obstructive sleep apnea (adult) (pediatric): Secondary | ICD-10-CM | POA: Diagnosis not present

## 2018-01-09 DIAGNOSIS — Z87891 Personal history of nicotine dependence: Secondary | ICD-10-CM | POA: Diagnosis not present

## 2018-01-09 DIAGNOSIS — Z8673 Personal history of transient ischemic attack (TIA), and cerebral infarction without residual deficits: Secondary | ICD-10-CM | POA: Diagnosis not present

## 2018-01-09 DIAGNOSIS — Z8249 Family history of ischemic heart disease and other diseases of the circulatory system: Secondary | ICD-10-CM | POA: Diagnosis not present

## 2018-01-09 DIAGNOSIS — N183 Chronic kidney disease, stage 3 (moderate): Secondary | ICD-10-CM | POA: Diagnosis not present

## 2018-01-09 DIAGNOSIS — I5022 Chronic systolic (congestive) heart failure: Secondary | ICD-10-CM | POA: Diagnosis not present

## 2018-01-09 DIAGNOSIS — E1122 Type 2 diabetes mellitus with diabetic chronic kidney disease: Secondary | ICD-10-CM | POA: Diagnosis not present

## 2018-01-09 DIAGNOSIS — I255 Ischemic cardiomyopathy: Secondary | ICD-10-CM | POA: Diagnosis not present

## 2018-01-09 DIAGNOSIS — H53451 Other localized visual field defect, right eye: Secondary | ICD-10-CM | POA: Insufficient documentation

## 2018-01-09 DIAGNOSIS — Z951 Presence of aortocoronary bypass graft: Secondary | ICD-10-CM | POA: Insufficient documentation

## 2018-01-09 DIAGNOSIS — Z794 Long term (current) use of insulin: Secondary | ICD-10-CM | POA: Diagnosis not present

## 2018-01-09 DIAGNOSIS — I130 Hypertensive heart and chronic kidney disease with heart failure and stage 1 through stage 4 chronic kidney disease, or unspecified chronic kidney disease: Secondary | ICD-10-CM | POA: Insufficient documentation

## 2018-01-09 LAB — BASIC METABOLIC PANEL
Anion gap: 10 (ref 5–15)
BUN: 43 mg/dL — ABNORMAL HIGH (ref 8–23)
CHLORIDE: 102 mmol/L (ref 98–111)
CO2: 27 mmol/L (ref 22–32)
CREATININE: 1.92 mg/dL — AB (ref 0.61–1.24)
Calcium: 8.9 mg/dL (ref 8.9–10.3)
GFR calc non Af Amer: 33 mL/min — ABNORMAL LOW (ref >60)
GFR, EST AFRICAN AMERICAN: 38 mL/min — AB (ref >60)
Glucose, Bld: 204 mg/dL — ABNORMAL HIGH (ref 70–99)
Potassium: 3.6 mmol/L (ref 3.5–5.1)
SODIUM: 139 mmol/L (ref 135–145)

## 2018-01-09 LAB — BRAIN NATRIURETIC PEPTIDE: B Natriuretic Peptide: 212.7 pg/mL — ABNORMAL HIGH (ref 0.0–100.0)

## 2018-01-09 NOTE — Progress Notes (Signed)
ReDS Vest - 01/09/18 0900      ReDS Vest   Fitting Posture  Sitting    Height Marker  Tall   station D   Ruler Value  38    Center Strip  Aligned    ReDS Value  33

## 2018-01-09 NOTE — Progress Notes (Signed)
Advanced Heart Failure Clinic Note   Referring Physician: Dr. Denton Brick PCP: [name], NP PCP-Cardiologist: Peter Martinique, MD  Nephrology: Dr Lupita Raider [facility]) HF: [name]   HPI:  Nathaniel Bolton is a 78 y.o. male with PMH of CKD stage III, DM II, HTN, HLD, h/o VT s/p ICD, chronic systolic CHF, COPD, PAF on Eliquis, and h/o CVA referred by Dr. Martinique to [facility] for further management of HF.   Patient is s/b CABG in 2001.   Admitted July 2017 with syncope and VT at [facility].  He was loaded with amiodarone.  A cardiac catheterization done at that time showed EF 20%, patent LIMA to LAD, patent SVG to RCA, occluded SVG to OM.  This was discussed with Dr.  Roxy Manns to see if patient need redo CABG, however he was found not to be a good candidate for redo surgery.  His cath film was reviewed by Dr. Martinique, left circumflex artery had a 90% ostial stenosis, mid left circumflex on the OM spur occluded, however no good target for PCI.  He is in BEAT study and has a Barostim device.   Admitted 11/7 - 11/18/17 with 20 lb weight gain over 3 weeks. Diuresed with IV lasix and meds adjusted. Diuresis limited by AKI, but improved by discharge. Referred to HF clinic.  He presents today for follow up. Last visit, started on spiro. Metolazone cut back after with AKI, that improved on repeat labs. Weight down 7 lbs from last visit. Feeling Ok today, though dealing with a URI, he has been on ABX for the past week with sputum production. Denies SOB with ADLs or with mild exertion. Weight 189 at home this am. He states his weight has been down several lbs in the setting of his URI. He denies CP, orthopnea, or PND. Wears CPAP nightly. Taking all medication as directed, though he STOPPED metolazone, instead of cutting it back to once weekly. He has felt OK with this change.   ICD interrogation: Thoracic impedence depressed, but trending towards baseline. Optivol has trended up gradually since 12/3. Pt  activity ~1 hr daily. No VT/VF. Personally reviewed.   Echo 11/15/17 LVEF 20-25%, Mild/Mod MR, Mod LAE  Echo 04/2016 LVEF 25-30% [facility] [place]) Echo 02/2016 LVEF 30% Echo 10/2010 LVEF 25-30%  PFTs 12/02/17 FVC 2.69 (64%) FEV1 1.90 (62%) TLC 6.04 (85%) DLCO 14.86 (45%)   Review of systems complete and found to be negative unless listed in HPI.    Past Medical History:  Diagnosis Date  . CAD (coronary artery disease)   . CKD stage 3 due to type 2 diabetes mellitus ([place])   . Diabetes mellitus    Type 2  . Erectile dysfunction   . Gout   . Hyperlipidemia   . Hypertension   . ICD (implantable cardiac defibrillator) in place   . Myocardial infarction, old    x2 in [place]. S/P CABG 2003, Dr Roxy Manns  . Obstructive lung disease (HCC)    Moderate per prior PFT's  . Paroxysmal atrial fibrillation ([place]) 3/17   discovered on event monitor  . Stroke [facility])    2013  . Systolic CHF, acute on chronic (HCC)    EF is 27% per myoview and echo October 2012  . Ventricular tachycardia ([place]) 08/2014   175 bpm   Current Outpatient Medications  Medication Sig Dispense Refill  . allopurinol (ZYLOPRIM) 100 MG tablet Take 100 mg by mouth daily.    Marland Kitchen amiodarone (PACERONE) 200  MG tablet Take 1 tablet (200 mg total) by mouth daily. 30 tablet 6  . apixaban (ELIQUIS) 5 MG TABS tablet Take 1 tablet (5 mg total) by mouth 2 (two) times daily. 60 tablet 6  . Ascorbic Acid (VITAMIN C) 1000 MG tablet Take 1,000 mg by mouth daily.    Marland Kitchen atorvastatin (LIPITOR) 20 MG tablet Take 1 tablet (20 mg total) by mouth daily. (Patient taking differently: Take 40 mg by mouth daily at 6 PM. ) 30 tablet 6  . carvedilol (COREG) 3.125 MG tablet Take 1 tablet (3.125 mg total) by mouth 2 (two) times daily with a meal. 60 tablet 2  . Cholecalciferol (VITAMIN D3) 2000 units TABS Take 2,000 Units by mouth 2 (two) times daily.     . furosemide (LASIX) 80 MG tablet Take 1 tablet (80 mg total) by mouth 2 (two) times daily. 60  tablet 2  . gabapentin (NEURONTIN) 300 MG capsule Take 300 mg by mouth 3 (three) times daily.    . insulin aspart (NOVOLOG FLEXPEN) 100 UNIT/ML FlexPen inject 3 units if sugar is >151 ac meal; inject 2 units if sugar is btween 110 & 150, Do not inject any insulin  if your sugar is < 110 15 mL 11  . insulin glargine (LANTUS) 100 unit/mL SOPN Inject 0.1 mLs (10 Units total) into the skin daily. 15 mL 11  . Lactobacillus (PROBIOTIC ACIDOPHILUS PO) Take 1 capsule by mouth 2 (two) times daily.    Marland Kitchen losartan (COZAAR) 50 MG tablet Take 1 tablet (50 mg total) by mouth daily. 30 tablet 2  . Omega-3 Fatty Acids (FISH OIL PO) Take 2 tablets by mouth 2 (two) times daily.     Marland Kitchen omeprazole (PRILOSEC) 40 MG capsule Take 1 capsule (40 mg total) by mouth daily. (Patient taking differently: Take 20 mg by mouth daily. ) 30 capsule 6  . potassium chloride (K-DUR) 10 MEQ tablet Take 6 tabs (60 meq total) by mouth twice day (120 meq total) for 2 (two) days, then take 1 tab (10 meq) by mouth twice a day (20 meq total) 84 tablet 1  . Saw Palmetto, Serenoa repens, (SAW PALMETTO PO) Take 1 tablet by mouth daily.     . sertraline (ZOLOFT) 100 MG tablet Take 100 mg by mouth daily.    Marland Kitchen spironolactone (ALDACTONE) 25 MG tablet Take 0.5 tablets (12.5 mg total) by mouth at bedtime. 90 tablet 3  . tamsulosin (FLOMAX) 0.4 MG CAPS capsule Take 0.4 mg by mouth daily.    . VENTOLIN HFA 108 (90 Base) MCG/ACT inhaler   0  . vitamin E 400 UNIT capsule Take 400 Units by mouth daily.     . metolazone (ZAROXOLYN) 2.5 MG tablet TAKE 1 TABLET BY MOUTH 2 TIMES A WEEK ON Tuesday  AND Saturday 30 MINUTES BEFORE MORNING DOSE OF LASIX (Patient not taking: Reported on 01/09/2018) 15 tablet 2  . NITROSTAT 0.4 MG SL tablet Place 0.4 mg under the tongue every 5 (five) minutes as needed for chest pain (MAX 3 TABLETS).     . NON FORMULARY Needles, syringes with needles, glucose reagent test strips     No current facility-administered medications for this  encounter.    Allergies  Allergen Reactions  . Adhesive [Tape] Itching and Rash    Testosterone patch adhesive   Social History   Socioeconomic History  . Marital status: Married    Spouse name: Not on file  . Number of children: 4  . Years of  education: Not on file  . Highest education level: Not on file  Occupational History  . Occupation: heavy Radio producer  Social Needs  . Financial resource strain: Not on file  . Food insecurity:    Worry: Not on file    Inability: Not on file  . Transportation needs:    Medical: Not on file    Non-medical: Not on file  Tobacco Use  . Smoking status: Former Smoker    Last attempt to quit: 1991    Years since quitting: 29.0  . Smokeless tobacco: Never Used  Substance and Sexual Activity  . Alcohol use: No  . Drug use: No  . Sexual activity: Not on file  Lifestyle  . Physical activity:    Days per week: Not on file    Minutes per session: Not on file  . Stress: Not on file  Relationships  . Social connections:    Talks on phone: Not on file    Gets together: Not on file    Attends religious service: Not on file    Active member of club or organization: Not on file    Attends meetings of clubs or organizations: Not on file    Relationship status: Not on file  . Intimate partner violence:    Fear of current or ex partner: Not on file    Emotionally abused: Not on file    Physically abused: Not on file    Forced sexual activity: Not on file  Other Topics Concern  . Not on file  Social History Narrative  . Not on file    Family History  Problem Relation Age of Onset  . Heart failure Mother   . Heart attack Father   . Heart disease Sister        valve replaced  . Heart attack Brother        CABG, aortic grafting   Vitals:   01/09/18 0911  BP: (!) 152/76  Pulse: 68  SpO2: 97%  Weight: 87.5 kg (192 lb 12.8 oz)    Wt Readings from Last 3 Encounters:  01/09/18 87.5 kg (192 lb 12.8 oz)  01/03/18 87.5 kg (193  lb)  12/20/17 88.5 kg (195 lb)   PHYSICAL EXAM: General: NAD HEENT: Normal Neck: Supple. JVP 6-7 cm. Carotids 2+ bilat; no bruits. No thyromegaly or nodule noted. Cor: PMI nondisplaced. RRR, No M/G/R noted Lungs: Diminished throughout. No wheeze.  Abdomen: Soft, non-tender, non-distended, no HSM. No bruits or masses. +BS  Extremities: No cyanosis, clubbing, or rash. Trace ankle edema.   Neuro: Alert & orientedx3, cranial nerves grossly intact. moves all 4 extremities w/o difficulty. Affect pleasant   ECG: 11/26/17 NSR 61 bpm with PVCs, QRS 100 ms, personally reviewed.   ASSESSMENT & PLAN:  1. Chronic systolic CHF, ICM - Echo 21/1/94 LVEF 20-25%, Mild/Mod MR, Mod LAE - He is in BEAT study and has barostim.  - NYHA III symptoms chronically.  - Volume status looks OK on exam, despite Optivol changes. ReDs Clip 33% indicating upper limit of normal fluid.  - Continue lasix 80 mg BID.  - Continue metolazone as needed only.  - Continue coreg 3.125 mg BID. Will not uptitrate with borderline bradycardia. - Continue losartan 50 mg daily.  - Continue spiro 12.5 mg daily. - Reinforced fluid restriction to < 2 L daily, sodium restriction to less than 2000 mg daily, and the importance of daily weights.   - Not candidate for transplant with age, Outside candidate for  VAD with CKD III-IV, COPD, and immobility.    2. CAD s/p CABG - 07/2015 Cath at [facility] showed EF 20%, patent LIMA to LAD, patent SVG to RCA, occluded SVG to OM. Discussed with Dr.  Roxy Manns to see if patient need redo CABG, however not candidate. Cath film was reviewed by Dr. Martinique, left circumflex artery had a 90% ostial stenosis, mid left circumflex on the OM spur occluded, however no good target for PCI - No s/s of ischemia.     3. PAF - On Eliquis 5 mg BID. Denies bleeding.  - Regular on exam. EKG 11/26/17 with NSR.   4. CKD III - Baseline appears ~ 1.6.  - Recent AKI with Cr up to 2.37. Improved on recheck. BMET today.    - Follows with nephrology.   5. H/o CVA - On Eliquis as above.  - Has small, central visual deficit in R eye, but otherwise, no lasting deficit.   6. DM2 - Per PCP. Hgb A1C 8.7 11/14/17  7. HTN - Meds as above.   8. OSA - Continue nightly CPAP.   9. H/o VT - Distant history as well as shock in September for VT, managed at [facility] so no records available surrounding this incident and patient isn't sure.  - Continue amiodarone 200 mg daily.  - It may have been due to worsening CAD, especially in setting of now worsening CHF symptoms, but with borderline CKD and non-candidacy for intervention, would not proceed with repeat cath at this time. No s/s of ischemia.   If recurs, will need to reconsider.   10. Deconditioning - Referred to Cardiac Rehab.   11. COPD - PFTs 11/2017 with moderate obstructive disease consistent with Emphysema. He follows with pulmonary.   Labs today. No change to meds with labile renal function and hasn't taken meds today. RTC 2 months. Sooner with symptoms.   [name], PA-C 01/09/18   Greater than 50% of the 25 minute visit was spent in counseling/coordination of care regarding disease state education, salt/fluid restriction, sliding scale diuretics, and medication compliance.

## 2018-01-09 NOTE — Patient Instructions (Signed)
Labs today We will only contact you if something comes back abnormal or we need to make some changes. Otherwise no news is good news!   Your physician recommends that you schedule a follow-up appointment in: 2 months with Andy Tillery, PA   Do the following things EVERYDAY: 1) Weigh yourself in the morning before breakfast. Write it down and keep it in a log. 2) Take your medicines as prescribed 3) Eat low salt foods-Limit salt (sodium) to 2000 mg per day.  4) Stay as active as you can everyday 5) Limit all fluids for the day to less than 2 liters   

## 2018-01-21 DIAGNOSIS — Z951 Presence of aortocoronary bypass graft: Secondary | ICD-10-CM | POA: Diagnosis not present

## 2018-01-21 DIAGNOSIS — N183 Chronic kidney disease, stage 3 (moderate): Secondary | ICD-10-CM | POA: Diagnosis not present

## 2018-01-21 DIAGNOSIS — Z7902 Long term (current) use of antithrombotics/antiplatelets: Secondary | ICD-10-CM | POA: Diagnosis not present

## 2018-01-21 DIAGNOSIS — Z8673 Personal history of transient ischemic attack (TIA), and cerebral infarction without residual deficits: Secondary | ICD-10-CM | POA: Diagnosis not present

## 2018-01-21 DIAGNOSIS — J449 Chronic obstructive pulmonary disease, unspecified: Secondary | ICD-10-CM | POA: Diagnosis not present

## 2018-01-21 DIAGNOSIS — I252 Old myocardial infarction: Secondary | ICD-10-CM | POA: Diagnosis not present

## 2018-01-21 DIAGNOSIS — I5022 Chronic systolic (congestive) heart failure: Secondary | ICD-10-CM | POA: Diagnosis not present

## 2018-01-21 DIAGNOSIS — I480 Paroxysmal atrial fibrillation: Secondary | ICD-10-CM | POA: Diagnosis not present

## 2018-01-21 DIAGNOSIS — I130 Hypertensive heart and chronic kidney disease with heart failure and stage 1 through stage 4 chronic kidney disease, or unspecified chronic kidney disease: Secondary | ICD-10-CM | POA: Diagnosis not present

## 2018-01-22 DIAGNOSIS — J449 Chronic obstructive pulmonary disease, unspecified: Secondary | ICD-10-CM | POA: Diagnosis not present

## 2018-01-22 DIAGNOSIS — I252 Old myocardial infarction: Secondary | ICD-10-CM | POA: Diagnosis not present

## 2018-01-22 DIAGNOSIS — Z951 Presence of aortocoronary bypass graft: Secondary | ICD-10-CM | POA: Diagnosis not present

## 2018-01-22 DIAGNOSIS — Z7902 Long term (current) use of antithrombotics/antiplatelets: Secondary | ICD-10-CM | POA: Diagnosis not present

## 2018-01-22 DIAGNOSIS — I130 Hypertensive heart and chronic kidney disease with heart failure and stage 1 through stage 4 chronic kidney disease, or unspecified chronic kidney disease: Secondary | ICD-10-CM | POA: Diagnosis not present

## 2018-01-22 DIAGNOSIS — N183 Chronic kidney disease, stage 3 (moderate): Secondary | ICD-10-CM | POA: Diagnosis not present

## 2018-01-22 DIAGNOSIS — Z8673 Personal history of transient ischemic attack (TIA), and cerebral infarction without residual deficits: Secondary | ICD-10-CM | POA: Diagnosis not present

## 2018-01-22 DIAGNOSIS — I5022 Chronic systolic (congestive) heart failure: Secondary | ICD-10-CM | POA: Diagnosis not present

## 2018-01-22 DIAGNOSIS — I480 Paroxysmal atrial fibrillation: Secondary | ICD-10-CM | POA: Diagnosis not present

## 2018-01-24 DIAGNOSIS — I480 Paroxysmal atrial fibrillation: Secondary | ICD-10-CM | POA: Diagnosis not present

## 2018-01-24 DIAGNOSIS — I130 Hypertensive heart and chronic kidney disease with heart failure and stage 1 through stage 4 chronic kidney disease, or unspecified chronic kidney disease: Secondary | ICD-10-CM | POA: Diagnosis not present

## 2018-01-24 DIAGNOSIS — Z7902 Long term (current) use of antithrombotics/antiplatelets: Secondary | ICD-10-CM | POA: Diagnosis not present

## 2018-01-24 DIAGNOSIS — N183 Chronic kidney disease, stage 3 (moderate): Secondary | ICD-10-CM | POA: Diagnosis not present

## 2018-01-24 DIAGNOSIS — Z951 Presence of aortocoronary bypass graft: Secondary | ICD-10-CM | POA: Diagnosis not present

## 2018-01-24 DIAGNOSIS — I5022 Chronic systolic (congestive) heart failure: Secondary | ICD-10-CM | POA: Diagnosis not present

## 2018-01-24 DIAGNOSIS — I252 Old myocardial infarction: Secondary | ICD-10-CM | POA: Diagnosis not present

## 2018-01-24 DIAGNOSIS — J449 Chronic obstructive pulmonary disease, unspecified: Secondary | ICD-10-CM | POA: Diagnosis not present

## 2018-01-24 DIAGNOSIS — Z8673 Personal history of transient ischemic attack (TIA), and cerebral infarction without residual deficits: Secondary | ICD-10-CM | POA: Diagnosis not present

## 2018-01-27 DIAGNOSIS — I480 Paroxysmal atrial fibrillation: Secondary | ICD-10-CM | POA: Diagnosis not present

## 2018-01-27 DIAGNOSIS — I5022 Chronic systolic (congestive) heart failure: Secondary | ICD-10-CM | POA: Diagnosis not present

## 2018-01-27 DIAGNOSIS — Z7902 Long term (current) use of antithrombotics/antiplatelets: Secondary | ICD-10-CM | POA: Diagnosis not present

## 2018-01-27 DIAGNOSIS — Z8673 Personal history of transient ischemic attack (TIA), and cerebral infarction without residual deficits: Secondary | ICD-10-CM | POA: Diagnosis not present

## 2018-01-27 DIAGNOSIS — N183 Chronic kidney disease, stage 3 (moderate): Secondary | ICD-10-CM | POA: Diagnosis not present

## 2018-01-27 DIAGNOSIS — I252 Old myocardial infarction: Secondary | ICD-10-CM | POA: Diagnosis not present

## 2018-01-27 DIAGNOSIS — Z951 Presence of aortocoronary bypass graft: Secondary | ICD-10-CM | POA: Diagnosis not present

## 2018-01-27 DIAGNOSIS — J449 Chronic obstructive pulmonary disease, unspecified: Secondary | ICD-10-CM | POA: Diagnosis not present

## 2018-01-27 DIAGNOSIS — I130 Hypertensive heart and chronic kidney disease with heart failure and stage 1 through stage 4 chronic kidney disease, or unspecified chronic kidney disease: Secondary | ICD-10-CM | POA: Diagnosis not present

## 2018-01-29 DIAGNOSIS — I480 Paroxysmal atrial fibrillation: Secondary | ICD-10-CM | POA: Diagnosis not present

## 2018-01-29 DIAGNOSIS — Z7902 Long term (current) use of antithrombotics/antiplatelets: Secondary | ICD-10-CM | POA: Diagnosis not present

## 2018-01-29 DIAGNOSIS — N183 Chronic kidney disease, stage 3 (moderate): Secondary | ICD-10-CM | POA: Diagnosis not present

## 2018-01-29 DIAGNOSIS — J449 Chronic obstructive pulmonary disease, unspecified: Secondary | ICD-10-CM | POA: Diagnosis not present

## 2018-01-29 DIAGNOSIS — Z8673 Personal history of transient ischemic attack (TIA), and cerebral infarction without residual deficits: Secondary | ICD-10-CM | POA: Diagnosis not present

## 2018-01-29 DIAGNOSIS — I252 Old myocardial infarction: Secondary | ICD-10-CM | POA: Diagnosis not present

## 2018-01-29 DIAGNOSIS — I5022 Chronic systolic (congestive) heart failure: Secondary | ICD-10-CM | POA: Diagnosis not present

## 2018-01-29 DIAGNOSIS — I130 Hypertensive heart and chronic kidney disease with heart failure and stage 1 through stage 4 chronic kidney disease, or unspecified chronic kidney disease: Secondary | ICD-10-CM | POA: Diagnosis not present

## 2018-01-29 DIAGNOSIS — Z951 Presence of aortocoronary bypass graft: Secondary | ICD-10-CM | POA: Diagnosis not present

## 2018-01-29 LAB — CUP PACEART INCLINIC DEVICE CHECK
Battery Voltage: 3.01 V
Brady Statistic RV Percent Paced: 4.12 %
HighPow Impedance: 44 Ohm
HighPow Impedance: 60 Ohm
Implantable Lead Location: 753860
Implantable Lead Model: 6947
Implantable Pulse Generator Implant Date: 20180823
Lead Channel Impedance Value: 304 Ohm
Lead Channel Setting Pacing Amplitude: 2.5 V
Lead Channel Setting Pacing Pulse Width: 0.8 ms
Lead Channel Setting Sensing Sensitivity: 0.3 mV
MDC IDC LEAD IMPLANT DT: 20091217
MDC IDC MSMT BATTERY REMAINING LONGEVITY: 129 mo
MDC IDC MSMT LEADCHNL RV IMPEDANCE VALUE: 247 Ohm
MDC IDC MSMT LEADCHNL RV PACING THRESHOLD AMPLITUDE: 1.25 V
MDC IDC MSMT LEADCHNL RV PACING THRESHOLD PULSEWIDTH: 0.8 ms
MDC IDC MSMT LEADCHNL RV SENSING INTR AMPL: 5 mV
MDC IDC SESS DTM: 20191204213021

## 2018-01-31 DIAGNOSIS — Z951 Presence of aortocoronary bypass graft: Secondary | ICD-10-CM | POA: Diagnosis not present

## 2018-01-31 DIAGNOSIS — I480 Paroxysmal atrial fibrillation: Secondary | ICD-10-CM | POA: Diagnosis not present

## 2018-01-31 DIAGNOSIS — N183 Chronic kidney disease, stage 3 (moderate): Secondary | ICD-10-CM | POA: Diagnosis not present

## 2018-01-31 DIAGNOSIS — I130 Hypertensive heart and chronic kidney disease with heart failure and stage 1 through stage 4 chronic kidney disease, or unspecified chronic kidney disease: Secondary | ICD-10-CM | POA: Diagnosis not present

## 2018-01-31 DIAGNOSIS — I252 Old myocardial infarction: Secondary | ICD-10-CM | POA: Diagnosis not present

## 2018-01-31 DIAGNOSIS — Z8673 Personal history of transient ischemic attack (TIA), and cerebral infarction without residual deficits: Secondary | ICD-10-CM | POA: Diagnosis not present

## 2018-01-31 DIAGNOSIS — I5022 Chronic systolic (congestive) heart failure: Secondary | ICD-10-CM | POA: Diagnosis not present

## 2018-01-31 DIAGNOSIS — J449 Chronic obstructive pulmonary disease, unspecified: Secondary | ICD-10-CM | POA: Diagnosis not present

## 2018-01-31 DIAGNOSIS — Z7902 Long term (current) use of antithrombotics/antiplatelets: Secondary | ICD-10-CM | POA: Diagnosis not present

## 2018-02-02 DIAGNOSIS — G4733 Obstructive sleep apnea (adult) (pediatric): Secondary | ICD-10-CM | POA: Diagnosis not present

## 2018-02-03 ENCOUNTER — Ambulatory Visit (INDEPENDENT_AMBULATORY_CARE_PROVIDER_SITE_OTHER): Payer: Medicare HMO

## 2018-02-03 ENCOUNTER — Other Ambulatory Visit: Payer: Self-pay

## 2018-02-03 DIAGNOSIS — I509 Heart failure, unspecified: Secondary | ICD-10-CM

## 2018-02-03 DIAGNOSIS — Z95810 Presence of automatic (implantable) cardiac defibrillator: Secondary | ICD-10-CM | POA: Diagnosis not present

## 2018-02-03 NOTE — Patient Outreach (Signed)
Telephone follow up:  Placed call to patient with no answer.  Left a message requesting a call back.  PLAN: will await for a return call, If no return call will attempt again in 3 days.  Tomasa Rand, RN, BSN, CEN [facility] [employer] [phone]

## 2018-02-04 ENCOUNTER — Telehealth: Payer: Self-pay

## 2018-02-04 NOTE — Progress Notes (Signed)
EPIC Encounter for ICM Monitoring  Patient Name: Nathaniel Bolton is a 78 y.o. male Date: 02/04/2018 Primary Care Physican: [name], NP Primary Cardiologist: Jordan/[name] Electrophysiologist: Allred Last Weight: 192 lbs at last office visit 01/09/2018 Today's Weight:  Unknown  OBSERVATIONS (4)  RV Capture Management: Actual safety margin (2.2 X) > programmed margin (2 X).   Patient Activity less than 1 hr/day for 3 weeks.   1 monitored VT episodes, longest was 11 sec.   VF detection may be delayed: VF Detection Interval is faster than 300 ms (200 bpm).        Attempted call to wife and unable to reach. Transmission reviewed.   Also reviewed by Theodoro Doing, RN device nurse.  Report: Thoracic impedance abnormal suggesting fluid accumulation starting 01/18/2018 and trending back toward baseline.   Prescribed: Furosemide 80 mg Take 1 tablet (80 mg total) by mouth 2 (two) times daily.  Metolazone take 1 tablet by mouth 2 a week on Tuesday AND Saturday 30 minutes before morning dose of Lasix. Potassium 10 mEq Take 6 tabs (60 meq total) by mouth twice day (120 meq total) for 2 (two) days, then take 1 tab (10 meq) by mouth twice a day (20 meq total)  Recommendations: Unable to reach.  Follow-up plan: ICM clinic phone appointment on 02/10/2018 to recheck fluid levels.       Copy of ICM check sent to Dr. Rayann Heman and Dr Haroldine Laws.   3 month ICM trend: 02/03/2018    1 Year ICM trend:       [name], RN 02/04/2018 5:04 PM

## 2018-02-04 NOTE — Telephone Encounter (Signed)
Remote ICM transmission received.  Attempted call to wife regarding ICM remote transmission and no answer or voice mail.

## 2018-02-05 ENCOUNTER — Other Ambulatory Visit: Payer: Self-pay

## 2018-02-05 DIAGNOSIS — Z8673 Personal history of transient ischemic attack (TIA), and cerebral infarction without residual deficits: Secondary | ICD-10-CM | POA: Diagnosis not present

## 2018-02-05 DIAGNOSIS — Z951 Presence of aortocoronary bypass graft: Secondary | ICD-10-CM | POA: Diagnosis not present

## 2018-02-05 DIAGNOSIS — Z7902 Long term (current) use of antithrombotics/antiplatelets: Secondary | ICD-10-CM | POA: Diagnosis not present

## 2018-02-05 DIAGNOSIS — J449 Chronic obstructive pulmonary disease, unspecified: Secondary | ICD-10-CM | POA: Diagnosis not present

## 2018-02-05 DIAGNOSIS — N183 Chronic kidney disease, stage 3 (moderate): Secondary | ICD-10-CM | POA: Diagnosis not present

## 2018-02-05 DIAGNOSIS — I480 Paroxysmal atrial fibrillation: Secondary | ICD-10-CM | POA: Diagnosis not present

## 2018-02-05 DIAGNOSIS — I130 Hypertensive heart and chronic kidney disease with heart failure and stage 1 through stage 4 chronic kidney disease, or unspecified chronic kidney disease: Secondary | ICD-10-CM | POA: Diagnosis not present

## 2018-02-05 DIAGNOSIS — I252 Old myocardial infarction: Secondary | ICD-10-CM | POA: Diagnosis not present

## 2018-02-05 DIAGNOSIS — I5022 Chronic systolic (congestive) heart failure: Secondary | ICD-10-CM | POA: Diagnosis not present

## 2018-02-05 NOTE — Patient Outreach (Signed)
Telephone assessment:  Wife returned call and states patient is doing well with heart failure but is having problems with generalized weakness and falls. Active with outpatient physical therapy. Wife states that she has talked to therapist about not over working him. After first PT session when patient got home he fell.  Reviewed current goals with wife via phone and primary goal is to avoid falls right now.   Reviewed transfer of care to health coach and wife agreed.  PLAN: complex case management goals are met and will plan to transfer to health coach. Reviewed with patient ongoing daily self care regarding heart failure and when to call MD.  Tomasa Rand, RN, BSN, CEN [place] Coordinator [phone]

## 2018-02-06 ENCOUNTER — Encounter: Payer: Self-pay | Admitting: *Deleted

## 2018-02-07 DIAGNOSIS — J449 Chronic obstructive pulmonary disease, unspecified: Secondary | ICD-10-CM | POA: Diagnosis not present

## 2018-02-07 DIAGNOSIS — Z7902 Long term (current) use of antithrombotics/antiplatelets: Secondary | ICD-10-CM | POA: Diagnosis not present

## 2018-02-07 DIAGNOSIS — I480 Paroxysmal atrial fibrillation: Secondary | ICD-10-CM | POA: Diagnosis not present

## 2018-02-07 DIAGNOSIS — N183 Chronic kidney disease, stage 3 (moderate): Secondary | ICD-10-CM | POA: Diagnosis not present

## 2018-02-07 DIAGNOSIS — Z951 Presence of aortocoronary bypass graft: Secondary | ICD-10-CM | POA: Diagnosis not present

## 2018-02-07 DIAGNOSIS — I252 Old myocardial infarction: Secondary | ICD-10-CM | POA: Diagnosis not present

## 2018-02-07 DIAGNOSIS — Z8673 Personal history of transient ischemic attack (TIA), and cerebral infarction without residual deficits: Secondary | ICD-10-CM | POA: Diagnosis not present

## 2018-02-07 DIAGNOSIS — I130 Hypertensive heart and chronic kidney disease with heart failure and stage 1 through stage 4 chronic kidney disease, or unspecified chronic kidney disease: Secondary | ICD-10-CM | POA: Diagnosis not present

## 2018-02-07 DIAGNOSIS — I5022 Chronic systolic (congestive) heart failure: Secondary | ICD-10-CM | POA: Diagnosis not present

## 2018-02-10 ENCOUNTER — Ambulatory Visit (INDEPENDENT_AMBULATORY_CARE_PROVIDER_SITE_OTHER): Payer: Medicare HMO

## 2018-02-10 DIAGNOSIS — Z8673 Personal history of transient ischemic attack (TIA), and cerebral infarction without residual deficits: Secondary | ICD-10-CM | POA: Diagnosis not present

## 2018-02-10 DIAGNOSIS — Z7902 Long term (current) use of antithrombotics/antiplatelets: Secondary | ICD-10-CM | POA: Diagnosis not present

## 2018-02-10 DIAGNOSIS — I252 Old myocardial infarction: Secondary | ICD-10-CM | POA: Diagnosis not present

## 2018-02-10 DIAGNOSIS — J449 Chronic obstructive pulmonary disease, unspecified: Secondary | ICD-10-CM | POA: Diagnosis not present

## 2018-02-10 DIAGNOSIS — I480 Paroxysmal atrial fibrillation: Secondary | ICD-10-CM | POA: Diagnosis not present

## 2018-02-10 DIAGNOSIS — Z951 Presence of aortocoronary bypass graft: Secondary | ICD-10-CM | POA: Diagnosis not present

## 2018-02-10 DIAGNOSIS — N183 Chronic kidney disease, stage 3 (moderate): Secondary | ICD-10-CM | POA: Diagnosis not present

## 2018-02-10 DIAGNOSIS — I130 Hypertensive heart and chronic kidney disease with heart failure and stage 1 through stage 4 chronic kidney disease, or unspecified chronic kidney disease: Secondary | ICD-10-CM | POA: Diagnosis not present

## 2018-02-10 DIAGNOSIS — Z95810 Presence of automatic (implantable) cardiac defibrillator: Secondary | ICD-10-CM

## 2018-02-10 DIAGNOSIS — I509 Heart failure, unspecified: Secondary | ICD-10-CM

## 2018-02-10 DIAGNOSIS — I5022 Chronic systolic (congestive) heart failure: Secondary | ICD-10-CM | POA: Diagnosis not present

## 2018-02-10 NOTE — Progress Notes (Signed)
EPIC Encounter for ICM Monitoring  Patient Name: Nathaniel Bolton is a 78 y.o. male Date: 02/10/2018 Primary Care Physican: [name], NP Primary Cardiologist: Jordan/[name] Electrophysiologist: Allred Last Weight: 192 lbs at last office visit 01/09/2018 Today's Weight:  195 lbs                                                  Spoke with wife.  She stated patients ankles were a little swollen and she has given him extra Furosemide.     Report: Thoracic impedance trending below baseline for a few days suggesting fluid accumulation but trending toward baseline.   Prescribed: Furosemide 80 mg Take 1 tablet (80 mg total) by mouth 2 (two) times daily.  She said HF clinic stopped Metolazone.  Potassium 10 mEq Take 6 tabs (60 meq total) by mouth twice day (120 meq total) for 2 (two) days, then take 1 tab (10 meq) by mouth twice a day (20 meq total)  Recommendations:  Wife adjusted Furosemide on their own due to slight swelling of the ankles.  Follow-up plan: ICM clinic phone appointment on 03/11/2018 to recheck fluid levels.       Copy of ICM check sent to Dr. Rayann Heman and Dr Haroldine Laws.   3 month ICM trend: 02/10/2018    1 Year ICM trend:       [name], RN 02/10/2018 3:43 PM

## 2018-02-12 DIAGNOSIS — J449 Chronic obstructive pulmonary disease, unspecified: Secondary | ICD-10-CM | POA: Diagnosis not present

## 2018-02-12 DIAGNOSIS — I5022 Chronic systolic (congestive) heart failure: Secondary | ICD-10-CM | POA: Diagnosis not present

## 2018-02-12 DIAGNOSIS — I130 Hypertensive heart and chronic kidney disease with heart failure and stage 1 through stage 4 chronic kidney disease, or unspecified chronic kidney disease: Secondary | ICD-10-CM | POA: Diagnosis not present

## 2018-02-12 DIAGNOSIS — N183 Chronic kidney disease, stage 3 (moderate): Secondary | ICD-10-CM | POA: Diagnosis not present

## 2018-02-12 DIAGNOSIS — Z7902 Long term (current) use of antithrombotics/antiplatelets: Secondary | ICD-10-CM | POA: Diagnosis not present

## 2018-02-12 DIAGNOSIS — Z8673 Personal history of transient ischemic attack (TIA), and cerebral infarction without residual deficits: Secondary | ICD-10-CM | POA: Diagnosis not present

## 2018-02-12 DIAGNOSIS — I252 Old myocardial infarction: Secondary | ICD-10-CM | POA: Diagnosis not present

## 2018-02-12 DIAGNOSIS — I480 Paroxysmal atrial fibrillation: Secondary | ICD-10-CM | POA: Diagnosis not present

## 2018-02-12 DIAGNOSIS — Z951 Presence of aortocoronary bypass graft: Secondary | ICD-10-CM | POA: Diagnosis not present

## 2018-02-14 DIAGNOSIS — I130 Hypertensive heart and chronic kidney disease with heart failure and stage 1 through stage 4 chronic kidney disease, or unspecified chronic kidney disease: Secondary | ICD-10-CM | POA: Diagnosis not present

## 2018-02-14 DIAGNOSIS — Z7902 Long term (current) use of antithrombotics/antiplatelets: Secondary | ICD-10-CM | POA: Diagnosis not present

## 2018-02-14 DIAGNOSIS — I480 Paroxysmal atrial fibrillation: Secondary | ICD-10-CM | POA: Diagnosis not present

## 2018-02-14 DIAGNOSIS — I252 Old myocardial infarction: Secondary | ICD-10-CM | POA: Diagnosis not present

## 2018-02-14 DIAGNOSIS — N183 Chronic kidney disease, stage 3 (moderate): Secondary | ICD-10-CM | POA: Diagnosis not present

## 2018-02-14 DIAGNOSIS — I5022 Chronic systolic (congestive) heart failure: Secondary | ICD-10-CM | POA: Diagnosis not present

## 2018-02-14 DIAGNOSIS — Z8673 Personal history of transient ischemic attack (TIA), and cerebral infarction without residual deficits: Secondary | ICD-10-CM | POA: Diagnosis not present

## 2018-02-14 DIAGNOSIS — J449 Chronic obstructive pulmonary disease, unspecified: Secondary | ICD-10-CM | POA: Diagnosis not present

## 2018-02-14 DIAGNOSIS — Z951 Presence of aortocoronary bypass graft: Secondary | ICD-10-CM | POA: Diagnosis not present

## 2018-02-17 DIAGNOSIS — Z8673 Personal history of transient ischemic attack (TIA), and cerebral infarction without residual deficits: Secondary | ICD-10-CM | POA: Diagnosis not present

## 2018-02-17 DIAGNOSIS — I252 Old myocardial infarction: Secondary | ICD-10-CM | POA: Diagnosis not present

## 2018-02-17 DIAGNOSIS — J449 Chronic obstructive pulmonary disease, unspecified: Secondary | ICD-10-CM | POA: Diagnosis not present

## 2018-02-17 DIAGNOSIS — N183 Chronic kidney disease, stage 3 (moderate): Secondary | ICD-10-CM | POA: Diagnosis not present

## 2018-02-17 DIAGNOSIS — I5022 Chronic systolic (congestive) heart failure: Secondary | ICD-10-CM | POA: Diagnosis not present

## 2018-02-17 DIAGNOSIS — I130 Hypertensive heart and chronic kidney disease with heart failure and stage 1 through stage 4 chronic kidney disease, or unspecified chronic kidney disease: Secondary | ICD-10-CM | POA: Diagnosis not present

## 2018-02-17 DIAGNOSIS — Z951 Presence of aortocoronary bypass graft: Secondary | ICD-10-CM | POA: Diagnosis not present

## 2018-02-17 DIAGNOSIS — I480 Paroxysmal atrial fibrillation: Secondary | ICD-10-CM | POA: Diagnosis not present

## 2018-02-17 DIAGNOSIS — Z7902 Long term (current) use of antithrombotics/antiplatelets: Secondary | ICD-10-CM | POA: Diagnosis not present

## 2018-02-18 ENCOUNTER — Encounter: Payer: Medicare HMO | Admitting: *Deleted

## 2018-02-18 VITALS — BP 140/58 | HR 66 | Wt 196.4 lb

## 2018-02-18 DIAGNOSIS — Z006 Encounter for examination for normal comparison and control in clinical research program: Secondary | ICD-10-CM

## 2018-02-18 NOTE — Research (Signed)
  Pt and wife here today for his 44 month BEAT HF device check . He is feeling great. No medication changes    Will see patient back in July for 30 month follow up.

## 2018-02-19 DIAGNOSIS — Z8673 Personal history of transient ischemic attack (TIA), and cerebral infarction without residual deficits: Secondary | ICD-10-CM | POA: Diagnosis not present

## 2018-02-19 DIAGNOSIS — I252 Old myocardial infarction: Secondary | ICD-10-CM | POA: Diagnosis not present

## 2018-02-19 DIAGNOSIS — Z7902 Long term (current) use of antithrombotics/antiplatelets: Secondary | ICD-10-CM | POA: Diagnosis not present

## 2018-02-19 DIAGNOSIS — N183 Chronic kidney disease, stage 3 (moderate): Secondary | ICD-10-CM | POA: Diagnosis not present

## 2018-02-19 DIAGNOSIS — Z951 Presence of aortocoronary bypass graft: Secondary | ICD-10-CM | POA: Diagnosis not present

## 2018-02-19 DIAGNOSIS — I130 Hypertensive heart and chronic kidney disease with heart failure and stage 1 through stage 4 chronic kidney disease, or unspecified chronic kidney disease: Secondary | ICD-10-CM | POA: Diagnosis not present

## 2018-02-19 DIAGNOSIS — I5022 Chronic systolic (congestive) heart failure: Secondary | ICD-10-CM | POA: Diagnosis not present

## 2018-02-19 DIAGNOSIS — I480 Paroxysmal atrial fibrillation: Secondary | ICD-10-CM | POA: Diagnosis not present

## 2018-02-19 DIAGNOSIS — J449 Chronic obstructive pulmonary disease, unspecified: Secondary | ICD-10-CM | POA: Diagnosis not present

## 2018-02-21 DIAGNOSIS — Z8673 Personal history of transient ischemic attack (TIA), and cerebral infarction without residual deficits: Secondary | ICD-10-CM | POA: Diagnosis not present

## 2018-02-21 DIAGNOSIS — I480 Paroxysmal atrial fibrillation: Secondary | ICD-10-CM | POA: Diagnosis not present

## 2018-02-21 DIAGNOSIS — Z951 Presence of aortocoronary bypass graft: Secondary | ICD-10-CM | POA: Diagnosis not present

## 2018-02-21 DIAGNOSIS — I130 Hypertensive heart and chronic kidney disease with heart failure and stage 1 through stage 4 chronic kidney disease, or unspecified chronic kidney disease: Secondary | ICD-10-CM | POA: Diagnosis not present

## 2018-02-21 DIAGNOSIS — N183 Chronic kidney disease, stage 3 (moderate): Secondary | ICD-10-CM | POA: Diagnosis not present

## 2018-02-21 DIAGNOSIS — J449 Chronic obstructive pulmonary disease, unspecified: Secondary | ICD-10-CM | POA: Diagnosis not present

## 2018-02-21 DIAGNOSIS — Z7902 Long term (current) use of antithrombotics/antiplatelets: Secondary | ICD-10-CM | POA: Diagnosis not present

## 2018-02-21 DIAGNOSIS — I5022 Chronic systolic (congestive) heart failure: Secondary | ICD-10-CM | POA: Diagnosis not present

## 2018-02-21 DIAGNOSIS — I252 Old myocardial infarction: Secondary | ICD-10-CM | POA: Diagnosis not present

## 2018-02-24 DIAGNOSIS — J449 Chronic obstructive pulmonary disease, unspecified: Secondary | ICD-10-CM | POA: Diagnosis not present

## 2018-02-24 DIAGNOSIS — N183 Chronic kidney disease, stage 3 (moderate): Secondary | ICD-10-CM | POA: Diagnosis not present

## 2018-02-24 DIAGNOSIS — I480 Paroxysmal atrial fibrillation: Secondary | ICD-10-CM | POA: Diagnosis not present

## 2018-02-24 DIAGNOSIS — Z7902 Long term (current) use of antithrombotics/antiplatelets: Secondary | ICD-10-CM | POA: Diagnosis not present

## 2018-02-24 DIAGNOSIS — Z8673 Personal history of transient ischemic attack (TIA), and cerebral infarction without residual deficits: Secondary | ICD-10-CM | POA: Diagnosis not present

## 2018-02-24 DIAGNOSIS — Z951 Presence of aortocoronary bypass graft: Secondary | ICD-10-CM | POA: Diagnosis not present

## 2018-02-24 DIAGNOSIS — I130 Hypertensive heart and chronic kidney disease with heart failure and stage 1 through stage 4 chronic kidney disease, or unspecified chronic kidney disease: Secondary | ICD-10-CM | POA: Diagnosis not present

## 2018-02-24 DIAGNOSIS — I5022 Chronic systolic (congestive) heart failure: Secondary | ICD-10-CM | POA: Diagnosis not present

## 2018-02-24 DIAGNOSIS — I252 Old myocardial infarction: Secondary | ICD-10-CM | POA: Diagnosis not present

## 2018-03-03 DIAGNOSIS — Z951 Presence of aortocoronary bypass graft: Secondary | ICD-10-CM | POA: Diagnosis not present

## 2018-03-03 DIAGNOSIS — I480 Paroxysmal atrial fibrillation: Secondary | ICD-10-CM | POA: Diagnosis not present

## 2018-03-03 DIAGNOSIS — I252 Old myocardial infarction: Secondary | ICD-10-CM | POA: Diagnosis not present

## 2018-03-03 DIAGNOSIS — N183 Chronic kidney disease, stage 3 (moderate): Secondary | ICD-10-CM | POA: Diagnosis not present

## 2018-03-03 DIAGNOSIS — I5022 Chronic systolic (congestive) heart failure: Secondary | ICD-10-CM | POA: Diagnosis not present

## 2018-03-03 DIAGNOSIS — J449 Chronic obstructive pulmonary disease, unspecified: Secondary | ICD-10-CM | POA: Diagnosis not present

## 2018-03-03 DIAGNOSIS — Z8673 Personal history of transient ischemic attack (TIA), and cerebral infarction without residual deficits: Secondary | ICD-10-CM | POA: Diagnosis not present

## 2018-03-03 DIAGNOSIS — I130 Hypertensive heart and chronic kidney disease with heart failure and stage 1 through stage 4 chronic kidney disease, or unspecified chronic kidney disease: Secondary | ICD-10-CM | POA: Diagnosis not present

## 2018-03-03 DIAGNOSIS — Z7902 Long term (current) use of antithrombotics/antiplatelets: Secondary | ICD-10-CM | POA: Diagnosis not present

## 2018-03-04 ENCOUNTER — Other Ambulatory Visit: Payer: Self-pay | Admitting: *Deleted

## 2018-03-04 DIAGNOSIS — E782 Mixed hyperlipidemia: Secondary | ICD-10-CM | POA: Diagnosis not present

## 2018-03-04 DIAGNOSIS — I10 Essential (primary) hypertension: Secondary | ICD-10-CM | POA: Diagnosis not present

## 2018-03-04 DIAGNOSIS — D520 Dietary folate deficiency anemia: Secondary | ICD-10-CM | POA: Diagnosis not present

## 2018-03-04 DIAGNOSIS — I509 Heart failure, unspecified: Secondary | ICD-10-CM | POA: Diagnosis not present

## 2018-03-04 DIAGNOSIS — E559 Vitamin D deficiency, unspecified: Secondary | ICD-10-CM | POA: Diagnosis not present

## 2018-03-04 DIAGNOSIS — E1169 Type 2 diabetes mellitus with other specified complication: Secondary | ICD-10-CM | POA: Diagnosis not present

## 2018-03-04 DIAGNOSIS — R5383 Other fatigue: Secondary | ICD-10-CM | POA: Diagnosis not present

## 2018-03-04 DIAGNOSIS — D649 Anemia, unspecified: Secondary | ICD-10-CM | POA: Diagnosis not present

## 2018-03-04 DIAGNOSIS — G4733 Obstructive sleep apnea (adult) (pediatric): Secondary | ICD-10-CM | POA: Diagnosis not present

## 2018-03-04 DIAGNOSIS — N189 Chronic kidney disease, unspecified: Secondary | ICD-10-CM | POA: Diagnosis not present

## 2018-03-04 DIAGNOSIS — R309 Painful micturition, unspecified: Secondary | ICD-10-CM | POA: Diagnosis not present

## 2018-03-04 DIAGNOSIS — M109 Gout, unspecified: Secondary | ICD-10-CM | POA: Diagnosis not present

## 2018-03-04 DIAGNOSIS — R809 Proteinuria, unspecified: Secondary | ICD-10-CM | POA: Diagnosis not present

## 2018-03-04 DIAGNOSIS — J4540 Moderate persistent asthma, uncomplicated: Secondary | ICD-10-CM | POA: Diagnosis not present

## 2018-03-04 NOTE — Patient Outreach (Signed)
[facility]) Care Management  03/04/2018  Nathaniel Bolton [birth year] [id number]   RN Health Coach Initial Assessment  Referral Date:  02/06/2018 Referral Source:  Transfer from [place] Reason for Referral:  Continued Disease Management Education Insurance:  [facility] Medicare   Outreach Attempt:  Outreach attempt #1 to patient for introduction and initial telephone assessment.  Wife answered and verified HIPAA, (Release of Information on file).  RN Health Coach introduced self and role.  Wife verbally agrees to Disease Management outreaches.  States they are currently at MD office for appointment and can not complete initial assessment at this time.  Plan:  RN Health Coach will make another outreach attempt within the next 10 business days.   [place] [phone] [email or web address]

## 2018-03-05 DIAGNOSIS — G4733 Obstructive sleep apnea (adult) (pediatric): Secondary | ICD-10-CM | POA: Diagnosis not present

## 2018-03-05 DIAGNOSIS — Z8673 Personal history of transient ischemic attack (TIA), and cerebral infarction without residual deficits: Secondary | ICD-10-CM | POA: Diagnosis not present

## 2018-03-05 DIAGNOSIS — N183 Chronic kidney disease, stage 3 (moderate): Secondary | ICD-10-CM | POA: Diagnosis not present

## 2018-03-05 DIAGNOSIS — I252 Old myocardial infarction: Secondary | ICD-10-CM | POA: Diagnosis not present

## 2018-03-05 DIAGNOSIS — Z7902 Long term (current) use of antithrombotics/antiplatelets: Secondary | ICD-10-CM | POA: Diagnosis not present

## 2018-03-05 DIAGNOSIS — Z951 Presence of aortocoronary bypass graft: Secondary | ICD-10-CM | POA: Diagnosis not present

## 2018-03-05 DIAGNOSIS — I5022 Chronic systolic (congestive) heart failure: Secondary | ICD-10-CM | POA: Diagnosis not present

## 2018-03-05 DIAGNOSIS — J449 Chronic obstructive pulmonary disease, unspecified: Secondary | ICD-10-CM | POA: Diagnosis not present

## 2018-03-05 DIAGNOSIS — I480 Paroxysmal atrial fibrillation: Secondary | ICD-10-CM | POA: Diagnosis not present

## 2018-03-05 DIAGNOSIS — I130 Hypertensive heart and chronic kidney disease with heart failure and stage 1 through stage 4 chronic kidney disease, or unspecified chronic kidney disease: Secondary | ICD-10-CM | POA: Diagnosis not present

## 2018-03-06 ENCOUNTER — Other Ambulatory Visit: Payer: Self-pay | Admitting: *Deleted

## 2018-03-06 ENCOUNTER — Encounter: Payer: Self-pay | Admitting: *Deleted

## 2018-03-06 DIAGNOSIS — N189 Chronic kidney disease, unspecified: Secondary | ICD-10-CM | POA: Diagnosis not present

## 2018-03-06 DIAGNOSIS — Z6829 Body mass index (BMI) 29.0-29.9, adult: Secondary | ICD-10-CM | POA: Diagnosis not present

## 2018-03-06 DIAGNOSIS — I10 Essential (primary) hypertension: Secondary | ICD-10-CM | POA: Diagnosis not present

## 2018-03-06 DIAGNOSIS — I509 Heart failure, unspecified: Secondary | ICD-10-CM | POA: Diagnosis not present

## 2018-03-06 DIAGNOSIS — R2689 Other abnormalities of gait and mobility: Secondary | ICD-10-CM | POA: Diagnosis not present

## 2018-03-06 DIAGNOSIS — E1129 Type 2 diabetes mellitus with other diabetic kidney complication: Secondary | ICD-10-CM | POA: Diagnosis not present

## 2018-03-06 DIAGNOSIS — E785 Hyperlipidemia, unspecified: Secondary | ICD-10-CM | POA: Diagnosis not present

## 2018-03-06 NOTE — Patient Outreach (Signed)
[facility]) Care Management  [place]  03/06/2018   ISAC LINCKS 06-Oct-1940 [id number]   RN Health Coach Initial Assessment   Referral Date:  02/06/2018 Referral Source:  Transfer from [place] Reason for Referral:  Continued Disease Management Education Insurance:  [facility] Medicare    Outreach Attempt:  Successful telephone outreach to patient and wife for initial telephone assessment.  HIPAA verified with patient and wife (Release of Information on file).  Patient and wife completed initial telephone assessment.  Social:  Patient lives at home with wife.  Reports being independent with ADLs and wife assisting with IADLs.  Currently ambulating with a cane with plans to start using a walker per nephrologist recommendations.  Wife reports patient falling weekly, up to 3 times a week.  Denies any injuries other than bruising.  Patient currently does outpatient physical therapy and wife reporting therapist is recommending therapy for balance.  Wife transports him to his medical appointments.  DME in the home include:  Straight cane, Rolator walker, bilateral hearing aids, upper and lower dentures, pulse oximetry, eyeglasses, CBG meter, blood pressure cuff, scale, bedside commode, and wheelchair.  Conditions:  Per chart review and discussion with patient, PMH include but not limited to:  Chronic kidney disease stage III, diabetes, hypertension, hyperlipidemia, ventricular tachycardia with placement of implantable cardio defibrillator, chronic systolic congestive heart failure, COPD, paroxysmal atrial fibrillation, CVA, coronary artery disease, and gout.  Denies any recent emergency room visits or hospitalizations.  Continues to weigh himself daily.  Weight this morning was 198 pounds.  Monitors blood sugars about 3 times a day.  Fasting blood sugar this morning was 202.  Has been out of long acting insulin (Lantus) for the past 2 weeks and blood sugars have  ranged 200-400's.  Reports compliance with fast acting sliding scale insulin.  Last Hgb A1C was 8.7 on 11/14/2017.  Is awaiting lab results drawn today at primary office.  Medications:  Patient reports taking about 15 medications daily.  Wife manages medications with weekly pill box fills. States they are having trouble affording patient's Lantus ($200 for 5 pens) and ventolin inhaler ($40).  States daughter assisted with affording last Lantus prescription and that one of the pens was empty and they are out of Lantus as of 2 weeks ago.  Requested samples from primary care office today and office was out of samples.  Discussed [place] referral and patient and wife verbally agrees.  Encounter Medications:  Outpatient Encounter Medications as of 03/06/2018  Medication Sig Note  . allopurinol (ZYLOPRIM) 100 MG tablet Take 100 mg by mouth daily.   Marland Kitchen amiodarone (PACERONE) 200 MG tablet Take 1 tablet (200 mg total) by mouth daily.   Marland Kitchen apixaban (ELIQUIS) 5 MG TABS tablet Take 1 tablet (5 mg total) by mouth 2 (two) times daily.   . Ascorbic Acid (VITAMIN C) 1000 MG tablet Take 1,000 mg by mouth daily.   Marland Kitchen atorvastatin (LIPITOR) 20 MG tablet Take 1 tablet (20 mg total) by mouth daily. (Patient taking differently: Take 40 mg by mouth daily at 6 PM. )   . carvedilol (COREG) 3.125 MG tablet Take 1 tablet (3.125 mg total) by mouth 2 (two) times daily with a meal.   . Cholecalciferol (VITAMIN D3) 2000 units TABS Take 2,000 Units by mouth 2 (two) times daily.    . furosemide (LASIX) 80 MG tablet Take 1 tablet (80 mg total) by mouth 2 (two) times daily.   Marland Kitchen gabapentin (NEURONTIN)  300 MG capsule Take 300 mg by mouth 3 (three) times daily.   . insulin aspart (NOVOLOG FLEXPEN) 100 UNIT/ML FlexPen inject 3 units if sugar is >151 ac meal; inject 2 units if sugar is btween 110 & 150, Do not inject any insulin  if your sugar is < 110   . insulin glargine (LANTUS) 100 unit/mL SOPN Inject 0.1 mLs (10 Units total) into the  skin daily. 03/06/2018: Needs to get refilled  . Lactobacillus (PROBIOTIC ACIDOPHILUS PO) Take 1 capsule by mouth 2 (two) times daily.   Marland Kitchen losartan (COZAAR) 50 MG tablet Take 1 tablet (50 mg total) by mouth daily.   Marland Kitchen NITROSTAT 0.4 MG SL tablet Place 0.4 mg under the tongue every 5 (five) minutes as needed for chest pain (MAX 3 TABLETS).    . NON FORMULARY Needles, syringes with needles, glucose reagent test strips   . Omega-3 Fatty Acids (FISH OIL PO) Take 2 tablets by mouth 2 (two) times daily.    Marland Kitchen omeprazole (PRILOSEC) 40 MG capsule Take 1 capsule (40 mg total) by mouth daily. (Patient taking differently: Take 20 mg by mouth daily. )   . potassium chloride (K-DUR) 10 MEQ tablet Take 6 tabs (60 meq total) by mouth twice day (120 meq total) for 2 (two) days, then take 1 tab (10 meq) by mouth twice a day (20 meq total)   . Saw Palmetto, Serenoa repens, (SAW PALMETTO PO) Take 1 tablet by mouth daily.    . sertraline (ZOLOFT) 100 MG tablet Take 100 mg by mouth daily.   . tamsulosin (FLOMAX) 0.4 MG CAPS capsule Take 0.4 mg by mouth daily.   . VENTOLIN HFA 108 (90 Base) MCG/ACT inhaler    . vitamin E 400 UNIT capsule Take 400 Units by mouth daily.    . metolazone (ZAROXOLYN) 2.5 MG tablet TAKE 1 TABLET BY MOUTH 2 TIMES A WEEK ON Tuesday  AND Saturday 30 MINUTES BEFORE MORNING DOSE OF LASIX (Patient not taking: Reported on 01/09/2018)   . spironolactone (ALDACTONE) 25 MG tablet Take 0.5 tablets (12.5 mg total) by mouth at bedtime. 03/06/2018: Continues to take   No facility-administered encounter medications on file as of 03/06/2018.     Functional Status:  In your present state of health, do you have any difficulty performing the following activities: 03/06/2018 11/26/2017  Hearing? Y Y  Comment bilateral hearing aids buzzing in ears, has hearing aids but does not use them  Vision? N Y  Difficulty concentrating or making decisions? Y Y  Comment very forgetfull reports short term memory loss  Walking  or climbing stairs? Y Y  Comment falls weekly, very weak -  Dressing or bathing? N N  Doing errands, shopping? Tempie Donning  Comment wife drive to appointments does not Physiological scientist and eating ? Y N  Comment wife does a lot of the cooking -  Using the Toilet? N N  In the past six months, have you accidently leaked urine? N N  Do you have problems with loss of bowel control? Y Y  Comment incontinent of stool at times -  Managing your Medications? Tempie Donning  Comment wife manages medications reports insulin is expense.   Managing your Finances? N N  Housekeeping or managing your Housekeeping? Y N  Comment wife assist with household management -  Some recent data might be hidden    Fall/Depression Screening: Fall Risk  03/06/2018 02/05/2018 11/26/2017  Falls in the past year? 1 1 1  Number falls in past yr: 1 0 1  Comment falls weekly - -  Injury with Fall? 0 0 0  Risk for fall due to : History of fall(s);Impaired vision;Medication side effect;Impaired balance/gait;Impaired mobility History of fall(s);Impaired balance/gait;Impaired mobility History of fall(s)  Follow up Falls evaluation completed;Falls prevention discussed;Education provided - Falls evaluation completed   PHQ 2/9 Scores 03/06/2018 11/26/2017  PHQ - 2 Score 0 0   THN CM Care Plan Problem One     Most Recent Value  Care Plan Problem One  Trouble with self care management of diabetes related to inability to afford insulin and increased fall risk due to balance instability.  Role Documenting the Problem One  [place] for Problem One  Active  [facility] Long Term Goal   Patient and wife will report a decrease in the number of falls in the next 90 days.  THN Long Term Goal Start Date  03/06/18  Interventions for Problem One Long Term Goal  Current care plan and goals reviewed and discussed with patient and wife, encouraged to keep and attend medical appointments, reviewed medications and indications and encouraged  medication compliance, reviewed CHF zones and action plan, utilized teach back to have wife verbalize when to call the physician, encouraged to continue to weigh daily, fall precautions reviewed and discussed, sending EMMI Preventing Falls, sending EMMI Getting Up from Fall, encouraged wife and patient to continue to review heart failure education, encouraged patient and wife for patient to ambulate with walker to help with balance and falls prevention, encouraged patient to continue with outpatient therapy for stregnthening and balance  THN CM Short Term Goal #1   Patient and wife will report obtaining Lantus or long acting insulin within the next 30 days.  THN CM Short Term Goal #1 Start Date  03/06/18  Interventions for Short Term Goal #1  Confirmed patient has been out of Lantus for few weeks, encouraged patient to seek samples from primary care provider (currently office does not have any)-encouraged wife to continue to request sample in weeks to come, [facility] Pharmacist referral placed to assist with medication assistance forms, encouraged wife to seek assistance from family to pay for medications, encouraged wife to ask provider to call in one month supply of medication and attempt to get one pen from pharmacy  [facility] CM Short Term Goal #2   Patient and wife will report glucose ranges below 250 in the next 30 days.  THN CM Short Term Goal #2 Start Date  03/06/18  Interventions for Short Term Goal #2  Reviewed current glucose ranges with patient and wife (200-400's), sending Living Well with Diabetes Education Booklet, encouraged wife to review booklet, discussed encouraged diabetic low sodium diet, confirmed compliance with sliding scale fast acting insulin,      Appointments:  Attended appointment with primary care provider, Laverna Peace NP today and will scheduled 3 month follow up for May 2020.  Has scheduled appointment with Heart Failure Clinic on 03/10/2018.  Advanced Directives:  Reports having Living  [place] and [place] in place and do not wish to make any changes at this time.   Consent:  [facility] services reviewed and discussed.  Patient and wife verbally agree to Disease Management outreach and [place] referral.  Plan: [place] will send primary MD barriers letter. RN Health Coach will route initial telephone assessment note to primary MD. [place] will send patient Living Well with Diabetes Education Packet. RN  Health Coach will send patient EMMI Preventing Falls. RN Health Coach will send patient EMMI Getting Up from a Fall. RN Health Coach will make next telephone outreach to patient in the month of March.  [place] [phone] [email or web address]

## 2018-03-07 ENCOUNTER — Other Ambulatory Visit: Payer: Self-pay | Admitting: Pharmacist

## 2018-03-07 DIAGNOSIS — N183 Chronic kidney disease, stage 3 (moderate): Secondary | ICD-10-CM | POA: Diagnosis not present

## 2018-03-07 DIAGNOSIS — J449 Chronic obstructive pulmonary disease, unspecified: Secondary | ICD-10-CM | POA: Diagnosis not present

## 2018-03-07 DIAGNOSIS — I252 Old myocardial infarction: Secondary | ICD-10-CM | POA: Diagnosis not present

## 2018-03-07 DIAGNOSIS — Z951 Presence of aortocoronary bypass graft: Secondary | ICD-10-CM | POA: Diagnosis not present

## 2018-03-07 DIAGNOSIS — Z7902 Long term (current) use of antithrombotics/antiplatelets: Secondary | ICD-10-CM | POA: Diagnosis not present

## 2018-03-07 DIAGNOSIS — I5022 Chronic systolic (congestive) heart failure: Secondary | ICD-10-CM | POA: Diagnosis not present

## 2018-03-07 DIAGNOSIS — I480 Paroxysmal atrial fibrillation: Secondary | ICD-10-CM | POA: Diagnosis not present

## 2018-03-07 DIAGNOSIS — Z8673 Personal history of transient ischemic attack (TIA), and cerebral infarction without residual deficits: Secondary | ICD-10-CM | POA: Diagnosis not present

## 2018-03-07 DIAGNOSIS — I130 Hypertensive heart and chronic kidney disease with heart failure and stage 1 through stage 4 chronic kidney disease, or unspecified chronic kidney disease: Secondary | ICD-10-CM | POA: Diagnosis not present

## 2018-03-07 NOTE — Patient Outreach (Signed)
[facility]) Care Management  [place]   03/07/2018  GUTHRIE LEMME 08-24-1940 [id number]  Reason for referral: Medication Assistance with insulin  Referral source: [place] Current insurance:[employer]  Outreach:  Successful telephone call with Mr. Dugger spouse, [name].  HIPAA identifiers verified.   Subjective:  Spouse reports that patient started on Lantus in December but copay was $400 for 1 box of 5 pens which daughter paid for.  She reports patient had great results with Lantus but again copay in January was $200 which they cannot afford each month.  She states MD provided them with one sample pen which she does not recall the name.  She states patient uses the [place] in [place] for Novolog but that told Lantus is not covered.   She requests that I call her back on Monday when she has more time to discuss as she is babysitting grandchildren right now.   Plan: Will call patient and spouse on Monday regarding Lantus.   Ralene Bathe, PharmD, [place] [phone]

## 2018-03-10 ENCOUNTER — Ambulatory Visit (HOSPITAL_COMMUNITY)
Admission: RE | Admit: 2018-03-10 | Discharge: 2018-03-10 | Disposition: A | Discharge status: Home / Self Care | Payer: Medicare HMO | Source: Ambulatory Visit | Attending: Internal Medicine | Admitting: Internal Medicine

## 2018-03-10 ENCOUNTER — Other Ambulatory Visit: Payer: Self-pay | Admitting: Pharmacist

## 2018-03-10 ENCOUNTER — Other Ambulatory Visit: Payer: Self-pay

## 2018-03-10 ENCOUNTER — Encounter (HOSPITAL_COMMUNITY): Payer: Self-pay

## 2018-03-10 VITALS — BP 134/66 | HR 60 | Wt 197.2 lb

## 2018-03-10 DIAGNOSIS — I509 Heart failure, unspecified: Secondary | ICD-10-CM | POA: Diagnosis not present

## 2018-03-10 DIAGNOSIS — N183 Chronic kidney disease, stage 3 (moderate): Secondary | ICD-10-CM | POA: Diagnosis not present

## 2018-03-10 DIAGNOSIS — I5022 Chronic systolic (congestive) heart failure: Secondary | ICD-10-CM

## 2018-03-10 DIAGNOSIS — I10 Essential (primary) hypertension: Secondary | ICD-10-CM

## 2018-03-10 DIAGNOSIS — Z8249 Family history of ischemic heart disease and other diseases of the circulatory system: Secondary | ICD-10-CM | POA: Insufficient documentation

## 2018-03-10 DIAGNOSIS — M109 Gout, unspecified: Secondary | ICD-10-CM | POA: Insufficient documentation

## 2018-03-10 DIAGNOSIS — I472 Ventricular tachycardia, unspecified: Secondary | ICD-10-CM

## 2018-03-10 DIAGNOSIS — I25810 Atherosclerosis of coronary artery bypass graft(s) without angina pectoris: Secondary | ICD-10-CM | POA: Diagnosis not present

## 2018-03-10 DIAGNOSIS — J449 Chronic obstructive pulmonary disease, unspecified: Secondary | ICD-10-CM | POA: Insufficient documentation

## 2018-03-10 DIAGNOSIS — Z794 Long term (current) use of insulin: Secondary | ICD-10-CM | POA: Diagnosis not present

## 2018-03-10 DIAGNOSIS — E785 Hyperlipidemia, unspecified: Secondary | ICD-10-CM | POA: Insufficient documentation

## 2018-03-10 DIAGNOSIS — Z87891 Personal history of nicotine dependence: Secondary | ICD-10-CM | POA: Diagnosis not present

## 2018-03-10 DIAGNOSIS — Z888 Allergy status to other drugs, medicaments and biological substances status: Secondary | ICD-10-CM | POA: Diagnosis not present

## 2018-03-10 DIAGNOSIS — I252 Old myocardial infarction: Secondary | ICD-10-CM | POA: Diagnosis not present

## 2018-03-10 DIAGNOSIS — E1122 Type 2 diabetes mellitus with diabetic chronic kidney disease: Secondary | ICD-10-CM | POA: Diagnosis not present

## 2018-03-10 DIAGNOSIS — I480 Paroxysmal atrial fibrillation: Secondary | ICD-10-CM | POA: Diagnosis not present

## 2018-03-10 DIAGNOSIS — I130 Hypertensive heart and chronic kidney disease with heart failure and stage 1 through stage 4 chronic kidney disease, or unspecified chronic kidney disease: Secondary | ICD-10-CM | POA: Diagnosis not present

## 2018-03-10 DIAGNOSIS — Z951 Presence of aortocoronary bypass graft: Secondary | ICD-10-CM | POA: Insufficient documentation

## 2018-03-10 DIAGNOSIS — G4733 Obstructive sleep apnea (adult) (pediatric): Secondary | ICD-10-CM | POA: Insufficient documentation

## 2018-03-10 DIAGNOSIS — Z8673 Personal history of transient ischemic attack (TIA), and cerebral infarction without residual deficits: Secondary | ICD-10-CM | POA: Diagnosis not present

## 2018-03-10 DIAGNOSIS — E119 Type 2 diabetes mellitus without complications: Secondary | ICD-10-CM

## 2018-03-10 DIAGNOSIS — Z95810 Presence of automatic (implantable) cardiac defibrillator: Secondary | ICD-10-CM | POA: Diagnosis not present

## 2018-03-10 DIAGNOSIS — Z7901 Long term (current) use of anticoagulants: Secondary | ICD-10-CM | POA: Insufficient documentation

## 2018-03-10 DIAGNOSIS — N529 Male erectile dysfunction, unspecified: Secondary | ICD-10-CM | POA: Diagnosis not present

## 2018-03-10 DIAGNOSIS — Z79899 Other long term (current) drug therapy: Secondary | ICD-10-CM | POA: Insufficient documentation

## 2018-03-10 LAB — BASIC METABOLIC PANEL
ANION GAP: 12 (ref 5–15)
BUN: 50 mg/dL — ABNORMAL HIGH (ref 8–23)
CALCIUM: 8.9 mg/dL (ref 8.9–10.3)
CO2: 24 mmol/L (ref 22–32)
Chloride: 101 mmol/L (ref 98–111)
Creatinine, Ser: 2.12 mg/dL — ABNORMAL HIGH (ref 0.61–1.24)
GFR calc non Af Amer: 29 mL/min — ABNORMAL LOW (ref >60)
GFR, EST AFRICAN AMERICAN: 34 mL/min — AB (ref >60)
Glucose, Bld: 279 mg/dL — ABNORMAL HIGH (ref 70–99)
Potassium: 4.1 mmol/L (ref 3.5–5.1)
Sodium: 137 mmol/L (ref 135–145)

## 2018-03-10 MED ORDER — SPIRONOLACTONE 25 MG PO TABS: 25.0000 mg | ORAL_TABLET | Freq: Every day | ORAL | 6 refills | Status: DC

## 2018-03-10 MED ORDER — METOLAZONE 2.5 MG PO TABS: 2.5000 mg | ORAL_TABLET | ORAL | 0 refills | Status: DC

## 2018-03-10 NOTE — Progress Notes (Signed)
Advanced Heart Failure Clinic Note   Referring Physician: Dr. Denton Brick PCP: [name], NP PCP-Cardiologist: Peter Martinique, MD  Nephrology: Dr Lupita Raider [facility]) HF: [name]   HPI:  Nathaniel Bolton is a 78 y.o. male with PMH of CKD stage III, DM II, HTN, HLD, h/o VT s/p ICD, chronic systolic CHF, COPD, PAF on Eliquis, and h/o CVA referred by Dr. Martinique to [facility] for further management of HF.   Patient is s/b CABG in 2001.   Admitted July 2017 with syncope and VT at [facility].  He was loaded with amiodarone.  A cardiac catheterization done at that time showed EF 20%, patent LIMA to LAD, patent SVG to RCA, occluded SVG to OM.  This was discussed with Dr.  Roxy Manns to see if patient need redo CABG, however he was found not to be a good candidate for redo surgery.  His cath film was reviewed by Dr. Martinique, left circumflex artery had a 90% ostial stenosis, mid left circumflex on the OM spur occluded, however no good target for PCI.  He is in BEAT study and has a Barostim device.   Admitted 11/7 - 11/18/17 with 20 lb weight gain over 3 weeks. Diuresed with IV lasix and meds adjusted. Diuresis limited by AKI, but improved by discharge. Referred to HF clinic.  He presents today for regular follow up. No change to medications made at last visit with recent AKI. He is feeling OK overall. Denies SOB. Has been more fatigued lately. Weight up 4 lbs from last visit. Hasn't taken any metolazone since then. He denies orthopnea, CP, or PND. Ambulated in clinic today, O2 sats did not drop past 90-93%. Wearing CPAP nightly. Taking all medication as directed. Having swelling in legs and abdomen.   ICD interrogation: Thoracic impedence low, optivol trending up. Pt activity ~ 30 minutes a day. Personally reviewed.  Echo 11/15/17 LVEF 20-25%, Mild/Mod MR, Mod LAE  Echo 04/2016 [facility]VEF 25-30% [facility] [place] Echo 02/2016 LVEF 30% Echo 10/2010 LVEF 25-30%  PFTs 12/02/17 FVC 2.69 (64%) FEV1 1.90  (62%) TLC 6.04 (85%) DLCO 14.86 (45%)  Review of systems complete and found to be negative unless listed in HPI.    Past Medical History:  Diagnosis Date  . CAD (coronary artery disease)   . CKD stage 3 due to type 2 diabetes mellitus ([place])   . Diabetes mellitus    Type 2  . Erectile dysfunction   . Gout   . Hyperlipidemia   . Hypertension   . ICD (implantable cardiac defibrillator) in place   . Myocardial infarction, old    x2 in [place]. S/P CABG 2003, Dr Roxy Manns  . Obstructive lung disease (HCC)    Moderate per prior PFT's  . Paroxysmal atrial fibrillation ([place]) 3/17   discovered on event monitor  . Stroke [facility])    2013  . Systolic CHF, acute on chronic (HCC)    EF is 27% per myoview and echo October 2012  . Ventricular tachycardia ([place]) 08/2014   175 bpm   Current Outpatient Medications  Medication Sig Dispense Refill  . allopurinol (ZYLOPRIM) 100 MG tablet Take 100 mg by mouth daily.    Marland Kitchen amiodarone (PACERONE) 200 MG tablet Take 1 tablet (200 mg total) by mouth daily. 30 tablet 6  . apixaban (ELIQUIS) 5 MG TABS tablet Take 1 tablet (5 mg total) by mouth 2 (two) times daily. 60 tablet 6  . Ascorbic Acid (VITAMIN C) 1000 MG tablet Take  1,000 mg by mouth daily.    Marland Kitchen atorvastatin (LIPITOR) 20 MG tablet Take 1 tablet (20 mg total) by mouth daily. (Patient taking differently: Take 40 mg by mouth daily at 6 PM. ) 30 tablet 6  . carvedilol (COREG) 3.125 MG tablet Take 1 tablet (3.125 mg total) by mouth 2 (two) times daily with a meal. 60 tablet 2  . Cholecalciferol (VITAMIN D3) 2000 units TABS Take 2,000 Units by mouth 2 (two) times daily.     . furosemide (LASIX) 80 MG tablet Take 1 tablet (80 mg total) by mouth 2 (two) times daily. 60 tablet 2  . gabapentin (NEURONTIN) 300 MG capsule Take 300 mg by mouth 3 (three) times daily.    . insulin aspart (NOVOLOG FLEXPEN) 100 UNIT/ML FlexPen inject 3 units if sugar is >151 ac meal; inject 2 units if sugar is btween 110 & 150, Do not  inject any insulin  if your sugar is < 110 15 mL 11  . insulin glargine (LANTUS) 100 unit/mL SOPN Inject 0.1 mLs (10 Units total) into the skin daily. 15 mL 11  . Lactobacillus (PROBIOTIC ACIDOPHILUS PO) Take 1 capsule by mouth 2 (two) times daily.    Marland Kitchen losartan (COZAAR) 50 MG tablet Take 1 tablet (50 mg total) by mouth daily. 30 tablet 2  . metolazone (ZAROXOLYN) 2.5 MG tablet TAKE 1 TABLET BY MOUTH 2 TIMES A WEEK ON Tuesday  AND Saturday 30 MINUTES BEFORE MORNING DOSE OF LASIX (Patient not taking: Reported on 01/09/2018) 15 tablet 2  . NITROSTAT 0.4 MG SL tablet Place 0.4 mg under the tongue every 5 (five) minutes as needed for chest pain (MAX 3 TABLETS).     . NON FORMULARY Needles, syringes with needles, glucose reagent test strips    . Omega-3 Fatty Acids (FISH OIL PO) Take 2 tablets by mouth 2 (two) times daily.     Marland Kitchen omeprazole (PRILOSEC) 40 MG capsule Take 1 capsule (40 mg total) by mouth daily. (Patient taking differently: Take 20 mg by mouth daily. ) 30 capsule 6  . potassium chloride (K-DUR) 10 MEQ tablet Take 6 tabs (60 meq total) by mouth twice day (120 meq total) for 2 (two) days, then take 1 tab (10 meq) by mouth twice a day (20 meq total) 84 tablet 1  . Saw Palmetto, Serenoa repens, (SAW PALMETTO PO) Take 1 tablet by mouth daily.     . sertraline (ZOLOFT) 100 MG tablet Take 100 mg by mouth daily.    Marland Kitchen spironolactone (ALDACTONE) 25 MG tablet Take 0.5 tablets (12.5 mg total) by mouth at bedtime. 90 tablet 3  . tamsulosin (FLOMAX) 0.4 MG CAPS capsule Take 0.4 mg by mouth daily.    . VENTOLIN HFA 108 (90 Base) MCG/ACT inhaler   0  . vitamin E 400 UNIT capsule Take 400 Units by mouth daily.      No current facility-administered medications for this visit.    Allergies  Allergen Reactions  . Adhesive [Tape] Itching and Rash    Testosterone patch adhesive   Social History   Socioeconomic History  . Marital status: Married    Spouse name: Not on file  . Number of children: 4  .  Years of education: Not on file  . Highest education level: Not on file  Occupational History  . Occupation: heavy Radio producer  Social Needs  . Financial resource strain: Not on file  . Food insecurity:    Worry: Not on file    Inability:  Not on file  . Transportation needs:    Medical: Not on file    Non-medical: Not on file  Tobacco Use  . Smoking status: Former Smoker    Last attempt to quit: 1991    Years since quitting: 29.1  . Smokeless tobacco: Never Used  Substance and Sexual Activity  . Alcohol use: No  . Drug use: No  . Sexual activity: Not on file  Lifestyle  . Physical activity:    Days per week: Not on file    Minutes per session: Not on file  . Stress: Not on file  Relationships  . Social connections:    Talks on phone: Not on file    Gets together: Not on file    Attends religious service: Not on file    Active member of club or organization: Not on file    Attends meetings of clubs or organizations: Not on file    Relationship status: Not on file  . Intimate partner violence:    Fear of current or ex partner: Not on file    Emotionally abused: Not on file    Physically abused: Not on file    Forced sexual activity: Not on file  Other Topics Concern  . Not on file  Social History Narrative  . Not on file    Family History  Problem Relation Age of Onset  . Heart failure Mother   . Heart attack Father   . Heart disease Sister        valve replaced  . Heart attack Brother        CABG, aortic grafting   Vitals:   03/10/18 1013  BP: 134/66  Pulse: 60  SpO2: 90%  Weight: 89.4 kg (197 lb 3.2 oz)   Wt Readings from Last 3 Encounters:  03/10/18 89.4 kg (197 lb 3.2 oz)  02/18/18 89.1 kg (196 lb 6.4 oz)  02/05/18 87.5 kg (193 lb)   PHYSICAL EXAM: General: Well appearing. No resp difficulty. HEENT: Normal Neck: Supple. JVP 5-6. Carotids 2+ bilat; no bruits. No thyromegaly or nodule noted. Cor: PMI nondisplaced. RRR, No M/G/R  noted Lungs: CTAB, normal effort. Abdomen: Soft, non-tender, non-distended, no HSM. No bruits or masses. +BS  Extremities: No cyanosis, clubbing, or rash. R and LLE no edema.  Neuro: Alert & orientedx3, cranial nerves grossly intact. moves all 4 extremities w/o difficulty. Affect pleasant   ASSESSMENT & PLAN:  1. Chronic systolic CHF, ICM - Echo 92/4/26 LVEF 20-25%, Mild/Mod MR, Mod LAE - He is in BEAT study and has barostim.  - NYHA III symptoms chronically. - Volume status somewhat elevated  - Continue lasix 80 mg BID.  - Take metolazone with extra 40 meq of K tomorrow. Continue as needed otherwise.  - Continue coreg 3.125 mg BID. Will not uptitrate with borderline bradycardia. - Continue losartan 50 mg daily.  - Increase spiro to 25 mg daily BMET today and 10 days.  - Reinforced fluid restriction to < 2 L daily, sodium restriction to less than 2000 mg daily, and the importance of daily weights.   - Not candidate for transplant with age, Outside candidate for VAD with CKD III-IV, COPD, and immobility.    2. CAD s/p CABG - 07/2015 Cath at [facility] showed EF 20%, patent LIMA to LAD, patent SVG to RCA, occluded SVG to OM. Discussed with Dr.  Roxy Manns to see if patient need redo CABG, however not candidate. Cath film was reviewed by Dr. Martinique, left circumflex  artery had a 90% ostial stenosis, mid left circumflex on the OM spur occluded, however no good target for PCI - No s/s of ischemia.     3. PAF - On Eliquis 5 mg BID. Denies bleeding.  - Regular on exam. EKG 11/26/17 with NSR.   4. CKD III - Baseline 1.8 - 2.0 - BMET today.  - Follows with nephrology.   5. H/o CVA - On Eliquis as above. Denies bleeding.  - Has small, central visual deficit in R eye, but otherwise, no lasting deficit.   6. DM2 - Per PCP.   7. HTN - Meds as above.    8. OSA - Encouraged nightly CPAP.  9. H/o VT - Distant history as well as shock in September for VT, managed at [facility] so no records available  surrounding this incident and patient isn't sure.  - Continue amiodarone 200 mg daily.  - It may have been due to worsening CAD, especially in setting of now worsening CHF symptoms, but with borderline CKD and non-candidacy for intervention, would not proceed with repeat cath at this time.  - No s/s of ischemia. If recurs, will need to reconsider.   10. Deconditioning - Has been referred to Cardiac rehab.   11. COPD - PFTs 11/2017 with moderate obstructive disease consistent with Emphysema.  - Per pulmonary  - O2 sats stable on pulse ox with ambulation.   Doing well overall, but volume elevated. Take metolazone tomorrow. Repeat as needed. Increase spiro. Labs today and 10-14 days with increase of spiro. RTC 3 months.   [name], PA-C 03/10/18   Greater than 50% of the 25 minute visit was spent in counseling/coordination of care regarding disease state education, salt/fluid restriction, sliding scale diuretics, and medication compliance.

## 2018-03-10 NOTE — Patient Instructions (Signed)
INCREASE Spironolactone to 25mg  (1 tab) nightly  Take Metolazone 2.5mg  (1 tab) tomorrow  Take an Extra 36meq Potassium (2 tabs) tomorrow  Labs today and repeat in 2 weeks We will only contact you if something comes back abnormal or we need to make some changes. Otherwise no news is good news!  Your physician recommends that you schedule a follow-up appointment in: 2-3 months with Dr. Haroldine Laws  Do the following things EVERYDAY: 1) Weigh yourself in the morning before breakfast. Write it down and keep it in a log. 2) Take your medicines as prescribed 3) Eat low salt foods-Limit salt (sodium) to 2000 mg per day.  4) Stay as active as you can everyday 5) Limit all fluids for the day to less than 2 liters

## 2018-03-10 NOTE — Patient Outreach (Addendum)
[place] [facility]) Care Management  [place]   03/10/2018  Nathaniel Bolton Jun 02, 1940 [id number]  Reason for referral: Medication Assistance with Lantus  Referral source: [place] Current insurance:[employer] Medicare  PMHx includes but not limited to:  Chronic kidney disease stage IV, diabetes, hypertension, hyperlipidemia, ventricular tachycardia with placement of implantable cardio defibrillator, chronic systolic congestive heart failure, COPD, paroxysmal atrial fibrillation, CVA, coronary artery disease, and gout   Per review of [employer] formulary, Lantus is Tier 3 ($45 / 30 day supply).    Care coordination call to [place].  Per manager, Marzetta Board, 1 box of Lantus (5 pens) filled in November 2019 for #140 day supply with co-pay $225.  Next available refill due 03/15/2018.  Pharmacist willing to break box for patient to reduce day supply and copay if needed.   Care coordination call to [place].  Message left requesting call back regarding insulin formulary.    Outreach:  Successful telephone call with [name]'s spouse, [name].  HIPAA identifiers verified.   Subjective:  Spouse reports that she forgot to check the name of the sample pen of insulin patient currently using.  She states he has 2 of these sample pens. He will be able to afford to pay for insulin again on 03/26/2018 with next paycheck from [employer].  She confirms he is still using Lantus 10 units daily.   Spouse reports patient is not checking BP at home but has a BP cuff.    Spouse reports patient is checking CBGS TID.    Objective: Lab Results  Component Value Date   CREATININE 1.92 (H) 01/09/2018   CREATININE 1.93 (H) 12/16/2017   CREATININE 2.37 (H) 12/10/2017  CrCl ~ 41 ml/min (using Wt 89.4kg, SCr 1.9)  Lab Results  Component Value Date   HGBA1C 8.7 (H) 11/14/2017    Lipid Panel  No results found for: CHOL, TRIG, HDL, CHOLHDL, VLDL, LDLCALC, LDLDIRECT  BP Readings  from Last 3 Encounters:  02/18/18 (!) 140/58  01/09/18 (!) 152/76  12/11/17 (!) 122/54    Allergies  Allergen Reactions  . Adhesive [Tape] Itching and Rash    Testosterone patch adhesive    Medications Reviewed Today    Reviewed by Leona Singleton, RN (Registered Nurse) on 03/06/18 at 60  Med List Status: <None>  Medication Order Taking? Sig Documenting Provider Last Dose Status Informant  allopurinol (ZYLOPRIM) 100 MG tablet [id number] Yes Take 100 mg by mouth daily. [provider] Taking Active Spouse/Significant Other  amiodarone (PACERONE) 200 MG tablet [id number] Yes Take 1 tablet (200 mg total) by mouth daily. Martinique, Peter M, MD Taking Active Spouse/Significant Other  apixaban (ELIQUIS) 5 MG TABS tablet [id number] Yes Take 1 tablet (5 mg total) by mouth 2 (two) times daily. Martinique, Peter M, MD Taking Active Spouse/Significant Other  Ascorbic Acid (VITAMIN C) 1000 MG tablet [id number] Yes Take 1,000 mg by mouth daily. [provider] Taking Active Spouse/Significant Other  atorvastatin (LIPITOR) 20 MG tablet [id number] Yes Take 1 tablet (20 mg total) by mouth daily.  Patient taking differently:  Take 40 mg by mouth daily at 6 PM.    Martinique, Peter M, MD Taking Active Spouse/Significant Other  carvedilol (COREG) 3.125 MG tablet [id number] Yes Take 1 tablet (3.125 mg total) by mouth 2 (two) times daily with a meal. Emokpae, Courage, MD Taking Active   Cholecalciferol (VITAMIN D3) 2000 units TABS [id number] Yes Take 2,000 Units by mouth 2 (two) times daily.  [provider] Taking Active Spouse/Significant Other  furosemide (LASIX) 80 MG tablet [id number] Yes Take 1 tablet (80 mg total) by mouth 2 (two) times daily. Roxan Hockey, MD Taking Active   gabapentin (NEURONTIN) 300 MG capsule [id number] Yes Take 300 mg by mouth 3 (three) times daily. [facility] Taking Active Spouse/Significant Other  insulin aspart (NOVOLOG FLEXPEN) 100 UNIT/ML FlexPen [id number]  Yes inject 3 units if sugar is >151 ac meal; inject 2 units if sugar is btween 110 & 150, Do not inject any insulin  if your sugar is < 110 Emokpae, Courage, MD Taking Active   insulin glargine (LANTUS) 100 unit/mL SOPN [id number] Yes Inject 0.1 mLs (10 Units total) into the skin daily. Roxan Hockey, MD Taking Active            Med Note [name] Mar 06, 2018 12:07 PM) Needs to get refilled  Lactobacillus (PROBIOTIC ACIDOPHILUS PO) [id number] Yes Take 1 capsule by mouth 2 (two) times daily. [provider] Taking Active Spouse/Significant Other  losartan (COZAAR) 50 MG tablet [id number] Yes Take 1 tablet (50 mg total) by mouth daily. Roxan Hockey, MD Taking Active   metolazone (ZAROXOLYN) 2.5 MG tablet [id number] No TAKE 1 TABLET BY MOUTH 2 TIMES A WEEK ON Tuesday  AND Saturday 30 MINUTES BEFORE MORNING DOSE OF LASIX  Patient not taking:  Reported on 01/09/2018   Roxan Hockey, MD Not Taking Active   NITROSTAT 0.4 MG SL tablet [id number] Yes Place 0.4 mg under the tongue every 5 (five) minutes as needed for chest pain (MAX 3 TABLETS).  [provider] Taking Active Spouse/Significant Other           Med Note Nyoka Cowden, STACY L   Fri Apr 15, 2015  3:47 PM)    Baruch Gouty [id number] Yes Needles, syringes with needles, glucose reagent test strips [provider] Taking Active Spouse/Significant Other  Omega-3 Fatty Acids (FISH OIL PO) [id number] Yes Take 2 tablets by mouth 2 (two) times daily.  [provider] Taking Active Spouse/Significant Other  omeprazole (PRILOSEC) 40 MG capsule [id number] Yes Take 1 capsule (40 mg total) by mouth daily.  Patient taking differently:  Take 20 mg by mouth daily.    Martinique, Peter M, MD Taking Active Spouse/Significant Other  potassium chloride (K-DUR) 10 MEQ tablet [id number] Yes Take 6 tabs (60 meq total) by mouth twice day (120 meq total) for 2 (two) days, then take 1 tab (10 meq) by mouth twice a day (20 meq total)  [name], PA-C Taking Active   Saw Palmetto, Serenoa repens, (SAW PALMETTO PO) [id number] Yes Take 1 tablet by mouth daily.  [provider] Taking Active Spouse/Significant Other  sertraline (ZOLOFT) 100 MG tablet [id number] Yes Take 100 mg by mouth daily. [provider] Taking Active Spouse/Significant Other  spironolactone (ALDACTONE) 25 MG tablet [id number]  Take 0.5 tablets (12.5 mg total) by mouth at bedtime. [name], PA-C  Expired 02/27/18 2359            Med Note Hubert Azure D   Thu Mar 06, 2018 12:10 PM) Continues to take  tamsulosin (FLOMAX) 0.4 MG CAPS capsule [id number] Yes Take 0.4 mg by mouth daily. [provider] Taking Active Spouse/Significant Other  VENTOLIN HFA [phone] Base) MCG/ACT inhaler [id number] Yes  [provider] Taking Active Spouse/Significant Other  vitamin E 400 UNIT capsule [id number] Yes Take 400 Units by mouth daily.  [provider] Taking Active Spouse/Significant Other  Assessment:  Drugs sorted by system:  Neurologic/Psychologic: sertraline, gabapentin  Cardiovascular: amiodarone, atorvastatin, carvedilol, furosemide, losartan, NTG SL, spironolactone, omega-3 fatty acids  Pulmonary/Allergy: Albuterol   Gastrointestinal: lactobacillus  Endocrine: insulin aspart, insulin glargine  Pain: cyclobenzaprine, meloxicam  Genitourinary: Tamsulosin  Vitamins/Minerals/Supplements: Vitamin C, Vitamin D, Vitamin E, Saw Palmetto, Potassium  Miscellaneous: Allopurinol  Hematologic: Apixaban  Medication Review Findings:  -Does not have albuterol inhaler on hand - encouraged spouse to have this filled via [place], reviewed indication, directions, and administration  -CKD-III/IV -on daily meloxicam. Reviewed risks/benefits of NSAID with kidney disease.  Spouse will contact nephrologist to make aware patient is taking NSAID daily.  BMET in process currently from o/v with HF clinic  today.   -Increased bleeding risk with meloxicam and apixaban.  Monitor closely for signs and symptoms of bleeding.  -Monitor potassium closely with use of spironolactone, losartan, + potassium supplement.   Medication Assistance Findings:  Medication assistance needs identified.    Patient currently has 2 pharmacies: [facility].  [facility] does not charge [facility]-pays for medications that are covered while [facility] uses [employer] copay prices therefore preferable for patient to receive medications through the [place] for cost-savings.  Not eligible for Extra Help based on income.   2nd message left for pharmacist with [facility] in [place] to return call regarding formulary for long-acting insulin that patient can be converted to in order to receive medication from [place].   Reviewed possible eligibility for CGM as patient currently checking blood sugars TID and has insulin injections QID.  Spouse verbalized understanding.    Plan: Await call back from [place] regarding medication assistance.   Will route note to PCP [name] regarding medication findings above including CGM + albuterol RX for [place].   Ralene Bathe, PharmD, [place] [phone]   Addendum:  Incoming call received from [place].  Pharmacist reports that Lantus vials are on formulary and pens will be approved if patient has condition with vision or dexterity.  Pharmacist able to contact provider through [place] and will have Lantus prescription filled and sent to patient.   She recommends that [facility] to [facility] al[facility]erol inhaler be filled if needed prior to next appt on July 10th.  Also states that CGM orders must come from Endocrinologist rather than PCP which patient currently has through [place].  Pharmacist will contact patient today with update.  Contact information for patient confirmed.   Call placed to spouse, [name].  Provided update on insulin, albuterol, and CGM as above.  Spouse voiced understanding and was  very appreciative of [facility] assistance.  Provided my phone number should she need to reach out to me again in the future.    Plan: Will close [facility] case as no further medication assistance needs at this time.   Ralene Bathe, PharmD, [place] [phone]

## 2018-03-11 ENCOUNTER — Ambulatory Visit: Payer: Self-pay | Admitting: Pharmacist

## 2018-03-12 ENCOUNTER — Ambulatory Visit (INDEPENDENT_AMBULATORY_CARE_PROVIDER_SITE_OTHER): Payer: Medicare HMO | Admitting: *Deleted

## 2018-03-12 DIAGNOSIS — Z951 Presence of aortocoronary bypass graft: Secondary | ICD-10-CM | POA: Diagnosis not present

## 2018-03-12 DIAGNOSIS — I5022 Chronic systolic (congestive) heart failure: Secondary | ICD-10-CM | POA: Diagnosis not present

## 2018-03-12 DIAGNOSIS — Z8673 Personal history of transient ischemic attack (TIA), and cerebral infarction without residual deficits: Secondary | ICD-10-CM | POA: Diagnosis not present

## 2018-03-12 DIAGNOSIS — N183 Chronic kidney disease, stage 3 (moderate): Secondary | ICD-10-CM | POA: Diagnosis not present

## 2018-03-12 DIAGNOSIS — I255 Ischemic cardiomyopathy: Secondary | ICD-10-CM

## 2018-03-12 DIAGNOSIS — J449 Chronic obstructive pulmonary disease, unspecified: Secondary | ICD-10-CM | POA: Diagnosis not present

## 2018-03-12 DIAGNOSIS — I480 Paroxysmal atrial fibrillation: Secondary | ICD-10-CM | POA: Diagnosis not present

## 2018-03-12 DIAGNOSIS — I252 Old myocardial infarction: Secondary | ICD-10-CM | POA: Diagnosis not present

## 2018-03-12 DIAGNOSIS — Z7902 Long term (current) use of antithrombotics/antiplatelets: Secondary | ICD-10-CM | POA: Diagnosis not present

## 2018-03-12 DIAGNOSIS — I130 Hypertensive heart and chronic kidney disease with heart failure and stage 1 through stage 4 chronic kidney disease, or unspecified chronic kidney disease: Secondary | ICD-10-CM | POA: Diagnosis not present

## 2018-03-13 ENCOUNTER — Ambulatory Visit (INDEPENDENT_AMBULATORY_CARE_PROVIDER_SITE_OTHER): Payer: Medicare HMO

## 2018-03-13 ENCOUNTER — Inpatient Hospital Stay
Admission: AD | Admit: 2018-03-13 | Payer: Self-pay | Source: Other Acute Inpatient Hospital | Admitting: Internal Medicine

## 2018-03-13 DIAGNOSIS — I252 Old myocardial infarction: Secondary | ICD-10-CM | POA: Diagnosis not present

## 2018-03-13 DIAGNOSIS — I5022 Chronic systolic (congestive) heart failure: Secondary | ICD-10-CM | POA: Diagnosis not present

## 2018-03-13 DIAGNOSIS — I2510 Atherosclerotic heart disease of native coronary artery without angina pectoris: Secondary | ICD-10-CM | POA: Diagnosis not present

## 2018-03-13 DIAGNOSIS — E119 Type 2 diabetes mellitus without complications: Secondary | ICD-10-CM | POA: Diagnosis not present

## 2018-03-13 DIAGNOSIS — I499 Cardiac arrhythmia, unspecified: Secondary | ICD-10-CM | POA: Diagnosis not present

## 2018-03-13 DIAGNOSIS — R4020 Unspecified coma: Secondary | ICD-10-CM | POA: Diagnosis not present

## 2018-03-13 DIAGNOSIS — I472 Ventricular tachycardia: Secondary | ICD-10-CM | POA: Diagnosis not present

## 2018-03-13 DIAGNOSIS — J9811 Atelectasis: Secondary | ICD-10-CM | POA: Diagnosis not present

## 2018-03-13 DIAGNOSIS — Z87891 Personal history of nicotine dependence: Secondary | ICD-10-CM | POA: Diagnosis not present

## 2018-03-13 DIAGNOSIS — Z4502 Encounter for adjustment and management of automatic implantable cardiac defibrillator: Secondary | ICD-10-CM | POA: Diagnosis not present

## 2018-03-13 DIAGNOSIS — I509 Heart failure, unspecified: Secondary | ICD-10-CM | POA: Diagnosis not present

## 2018-03-13 DIAGNOSIS — R079 Chest pain, unspecified: Secondary | ICD-10-CM | POA: Diagnosis not present

## 2018-03-13 DIAGNOSIS — R000 Tachycardia, unspecified: Secondary | ICD-10-CM | POA: Diagnosis not present

## 2018-03-13 DIAGNOSIS — R55 Syncope and collapse: Secondary | ICD-10-CM | POA: Diagnosis not present

## 2018-03-13 DIAGNOSIS — I110 Hypertensive heart disease with heart failure: Secondary | ICD-10-CM | POA: Diagnosis not present

## 2018-03-13 DIAGNOSIS — Z95810 Presence of automatic (implantable) cardiac defibrillator: Secondary | ICD-10-CM

## 2018-03-13 LAB — CUP PACEART REMOTE DEVICE CHECK
Battery Remaining Longevity: 127 mo
Battery Voltage: 3.01 V
HighPow Impedance: 42 Ohm
HighPow Impedance: 53 Ohm
Implantable Lead Location: 753860
Implantable Lead Model: 6947
Lead Channel Impedance Value: 304 Ohm
Lead Channel Sensing Intrinsic Amplitude: 5.375 mV
Lead Channel Setting Pacing Amplitude: 2.5 V
Lead Channel Setting Pacing Pulse Width: 0.4 ms
Lead Channel Setting Sensing Sensitivity: 0.3 mV
MDC IDC LEAD IMPLANT DT: 20091217
MDC IDC MSMT LEADCHNL RV IMPEDANCE VALUE: 247 Ohm
MDC IDC MSMT LEADCHNL RV PACING THRESHOLD AMPLITUDE: 1.25 V
MDC IDC MSMT LEADCHNL RV PACING THRESHOLD PULSEWIDTH: 0.4 ms
MDC IDC MSMT LEADCHNL RV SENSING INTR AMPL: 5.375 mV
MDC IDC PG IMPLANT DT: 20180823
MDC IDC SESS DTM: 20200304083424
MDC IDC STAT BRADY RV PERCENT PACED: 5.36 %

## 2018-03-14 ENCOUNTER — Inpatient Hospital Stay (HOSPITAL_COMMUNITY)
Admission: AD | Admit: 2018-03-14 | Discharge: 2018-03-19 | DRG: 309 | Disposition: A | Discharge status: Home / Self Care | Payer: Medicare HMO | Source: Other Acute Inpatient Hospital | Attending: Cardiology | Admitting: Cardiology

## 2018-03-14 ENCOUNTER — Telehealth: Payer: Self-pay | Admitting: *Deleted

## 2018-03-14 DIAGNOSIS — E1121 Type 2 diabetes mellitus with diabetic nephropathy: Secondary | ICD-10-CM

## 2018-03-14 DIAGNOSIS — Z951 Presence of aortocoronary bypass graft: Secondary | ICD-10-CM | POA: Diagnosis not present

## 2018-03-14 DIAGNOSIS — I255 Ischemic cardiomyopathy: Secondary | ICD-10-CM | POA: Diagnosis present

## 2018-03-14 DIAGNOSIS — E785 Hyperlipidemia, unspecified: Secondary | ICD-10-CM | POA: Diagnosis present

## 2018-03-14 DIAGNOSIS — G4733 Obstructive sleep apnea (adult) (pediatric): Secondary | ICD-10-CM | POA: Diagnosis not present

## 2018-03-14 DIAGNOSIS — G912 (Idiopathic) normal pressure hydrocephalus: Secondary | ICD-10-CM | POA: Diagnosis present

## 2018-03-14 DIAGNOSIS — R9431 Abnormal electrocardiogram [ECG] [EKG]: Secondary | ICD-10-CM | POA: Diagnosis not present

## 2018-03-14 DIAGNOSIS — J440 Chronic obstructive pulmonary disease with acute lower respiratory infection: Secondary | ICD-10-CM | POA: Diagnosis present

## 2018-03-14 DIAGNOSIS — I5022 Chronic systolic (congestive) heart failure: Secondary | ICD-10-CM | POA: Diagnosis not present

## 2018-03-14 DIAGNOSIS — I2510 Atherosclerotic heart disease of native coronary artery without angina pectoris: Secondary | ICD-10-CM | POA: Diagnosis present

## 2018-03-14 DIAGNOSIS — I5023 Acute on chronic systolic (congestive) heart failure: Secondary | ICD-10-CM | POA: Diagnosis not present

## 2018-03-14 DIAGNOSIS — I252 Old myocardial infarction: Secondary | ICD-10-CM | POA: Diagnosis not present

## 2018-03-14 DIAGNOSIS — I440 Atrioventricular block, first degree: Secondary | ICD-10-CM | POA: Diagnosis not present

## 2018-03-14 DIAGNOSIS — E119 Type 2 diabetes mellitus without complications: Secondary | ICD-10-CM

## 2018-03-14 DIAGNOSIS — I4891 Unspecified atrial fibrillation: Secondary | ICD-10-CM | POA: Diagnosis present

## 2018-03-14 DIAGNOSIS — Z7901 Long term (current) use of anticoagulants: Secondary | ICD-10-CM | POA: Diagnosis not present

## 2018-03-14 DIAGNOSIS — E1165 Type 2 diabetes mellitus with hyperglycemia: Secondary | ICD-10-CM | POA: Diagnosis not present

## 2018-03-14 DIAGNOSIS — Z95810 Presence of automatic (implantable) cardiac defibrillator: Secondary | ICD-10-CM | POA: Diagnosis present

## 2018-03-14 DIAGNOSIS — I5042 Chronic combined systolic (congestive) and diastolic (congestive) heart failure: Secondary | ICD-10-CM | POA: Diagnosis present

## 2018-03-14 DIAGNOSIS — I2583 Coronary atherosclerosis due to lipid rich plaque: Secondary | ICD-10-CM | POA: Diagnosis not present

## 2018-03-14 DIAGNOSIS — I214 Non-ST elevation (NSTEMI) myocardial infarction: Secondary | ICD-10-CM | POA: Diagnosis not present

## 2018-03-14 DIAGNOSIS — Z794 Long term (current) use of insulin: Secondary | ICD-10-CM

## 2018-03-14 DIAGNOSIS — N183 Chronic kidney disease, stage 3 (moderate): Secondary | ICD-10-CM | POA: Diagnosis not present

## 2018-03-14 DIAGNOSIS — J209 Acute bronchitis, unspecified: Secondary | ICD-10-CM | POA: Diagnosis present

## 2018-03-14 DIAGNOSIS — I472 Ventricular tachycardia, unspecified: Secondary | ICD-10-CM

## 2018-03-14 DIAGNOSIS — R55 Syncope and collapse: Secondary | ICD-10-CM | POA: Diagnosis not present

## 2018-03-14 DIAGNOSIS — Z87891 Personal history of nicotine dependence: Secondary | ICD-10-CM | POA: Diagnosis not present

## 2018-03-14 DIAGNOSIS — I130 Hypertensive heart and chronic kidney disease with heart failure and stage 1 through stage 4 chronic kidney disease, or unspecified chronic kidney disease: Secondary | ICD-10-CM | POA: Diagnosis not present

## 2018-03-14 DIAGNOSIS — E1122 Type 2 diabetes mellitus with diabetic chronic kidney disease: Secondary | ICD-10-CM | POA: Diagnosis present

## 2018-03-14 DIAGNOSIS — I480 Paroxysmal atrial fibrillation: Secondary | ICD-10-CM | POA: Diagnosis not present

## 2018-03-14 DIAGNOSIS — I340 Nonrheumatic mitral (valve) insufficiency: Secondary | ICD-10-CM | POA: Diagnosis not present

## 2018-03-14 DIAGNOSIS — Z8673 Personal history of transient ischemic attack (TIA), and cerebral infarction without residual deficits: Secondary | ICD-10-CM | POA: Diagnosis not present

## 2018-03-14 DIAGNOSIS — Z049 Encounter for examination and observation for unspecified reason: Secondary | ICD-10-CM | POA: Diagnosis not present

## 2018-03-14 DIAGNOSIS — Z8249 Family history of ischemic heart disease and other diseases of the circulatory system: Secondary | ICD-10-CM

## 2018-03-14 DIAGNOSIS — I10 Essential (primary) hypertension: Secondary | ICD-10-CM | POA: Diagnosis not present

## 2018-03-14 DIAGNOSIS — R404 Transient alteration of awareness: Secondary | ICD-10-CM | POA: Diagnosis not present

## 2018-03-14 DIAGNOSIS — R000 Tachycardia, unspecified: Secondary | ICD-10-CM | POA: Diagnosis not present

## 2018-03-14 DIAGNOSIS — I4729 Other ventricular tachycardia: Secondary | ICD-10-CM

## 2018-03-14 DIAGNOSIS — Z79899 Other long term (current) drug therapy: Secondary | ICD-10-CM

## 2018-03-14 DIAGNOSIS — I509 Heart failure, unspecified: Secondary | ICD-10-CM | POA: Diagnosis not present

## 2018-03-14 DIAGNOSIS — R059 Cough, unspecified: Secondary | ICD-10-CM

## 2018-03-14 DIAGNOSIS — Z4502 Encounter for adjustment and management of automatic implantable cardiac defibrillator: Secondary | ICD-10-CM | POA: Diagnosis not present

## 2018-03-14 DIAGNOSIS — R05 Cough: Secondary | ICD-10-CM | POA: Diagnosis not present

## 2018-03-14 DIAGNOSIS — N184 Chronic kidney disease, stage 4 (severe): Secondary | ICD-10-CM | POA: Diagnosis not present

## 2018-03-14 DIAGNOSIS — I493 Ventricular premature depolarization: Secondary | ICD-10-CM | POA: Diagnosis not present

## 2018-03-14 DIAGNOSIS — N189 Chronic kidney disease, unspecified: Secondary | ICD-10-CM | POA: Diagnosis not present

## 2018-03-14 DIAGNOSIS — I110 Hypertensive heart disease with heart failure: Secondary | ICD-10-CM | POA: Diagnosis not present

## 2018-03-14 DIAGNOSIS — N320 Bladder-neck obstruction: Secondary | ICD-10-CM | POA: Diagnosis present

## 2018-03-14 MED ORDER — SODIUM CHLORIDE 0.9 % IV SOLN: 250.0000 mL | INTRAVENOUS | Status: DC | PRN

## 2018-03-14 MED ORDER — LOSARTAN POTASSIUM 50 MG PO TABS
50.0000 mg | ORAL_TABLET | Freq: Every day | ORAL | Status: DC
  Administered 2018-03-15 – 2018-03-17 (×3): 50 mg via ORAL
  Filled 2018-03-14 (×3): qty 1

## 2018-03-14 MED ORDER — INSULIN GLARGINE 100 UNIT/ML ~~LOC~~ SOLN
10.0000 [IU] | Freq: Every day | SUBCUTANEOUS | Status: DC
  Administered 2018-03-15: 10 [IU] via SUBCUTANEOUS
  Filled 2018-03-14: qty 0.1

## 2018-03-14 MED ORDER — FUROSEMIDE 80 MG PO TABS
80.0000 mg | ORAL_TABLET | Freq: Two times a day (BID) | ORAL | Status: DC
  Administered 2018-03-15 – 2018-03-17 (×5): 80 mg via ORAL
  Filled 2018-03-14 (×5): qty 1

## 2018-03-14 MED ORDER — GABAPENTIN 300 MG PO CAPS
300.0000 mg | ORAL_CAPSULE | Freq: Three times a day (TID) | ORAL | Status: DC
  Administered 2018-03-15 – 2018-03-19 (×13): 300 mg via ORAL
  Filled 2018-03-14 (×13): qty 1

## 2018-03-14 MED ORDER — CARVEDILOL 3.125 MG PO TABS
3.1250 mg | ORAL_TABLET | Freq: Two times a day (BID) | ORAL | Status: DC
  Administered 2018-03-15: 3.125 mg via ORAL
  Filled 2018-03-14: qty 1

## 2018-03-14 MED ORDER — ATORVASTATIN CALCIUM 10 MG PO TABS: 20.0000 mg | ORAL_TABLET | Freq: Every day | ORAL | Status: DC

## 2018-03-14 MED ORDER — ALLOPURINOL 100 MG PO TABS
100.0000 mg | ORAL_TABLET | Freq: Every day | ORAL | Status: DC
  Administered 2018-03-15 – 2018-03-19 (×5): 100 mg via ORAL
  Filled 2018-03-14 (×5): qty 1

## 2018-03-14 MED ORDER — ONDANSETRON HCL 4 MG/2ML IJ SOLN: 4.0000 mg | Freq: Four times a day (QID) | INTRAMUSCULAR | Status: DC | PRN

## 2018-03-14 MED ORDER — VITAMIN C 500 MG PO TABS
1000.0000 mg | ORAL_TABLET | Freq: Every day | ORAL | Status: DC
  Administered 2018-03-15 – 2018-03-19 (×5): 1000 mg via ORAL
  Filled 2018-03-14 (×5): qty 2

## 2018-03-14 MED ORDER — SPIRONOLACTONE 25 MG PO TABS
25.0000 mg | ORAL_TABLET | Freq: Every day | ORAL | Status: DC
Start: 2018-03-15 — End: 2018-03-17
  Administered 2018-03-15 – 2018-03-16 (×2): 25 mg via ORAL
  Filled 2018-03-14 (×2): qty 1

## 2018-03-14 MED ORDER — POTASSIUM CHLORIDE CRYS ER 10 MEQ PO TBCR
10.0000 meq | EXTENDED_RELEASE_TABLET | Freq: Two times a day (BID) | ORAL | Status: DC
  Administered 2018-03-15 – 2018-03-19 (×9): 10 meq via ORAL
  Filled 2018-03-14 (×9): qty 1

## 2018-03-14 MED ORDER — VITAMIN D 25 MCG (1000 UNIT) PO TABS
2000.0000 [IU] | ORAL_TABLET | Freq: Two times a day (BID) | ORAL | Status: DC
  Administered 2018-03-15 – 2018-03-19 (×9): 2000 [IU] via ORAL
  Filled 2018-03-14 (×9): qty 2

## 2018-03-14 MED ORDER — PANTOPRAZOLE SODIUM 40 MG PO TBEC
40.0000 mg | DELAYED_RELEASE_TABLET | Freq: Every day | ORAL | Status: DC
  Administered 2018-03-15 – 2018-03-19 (×5): 40 mg via ORAL
  Filled 2018-03-14 (×5): qty 1

## 2018-03-14 MED ORDER — TAMSULOSIN HCL 0.4 MG PO CAPS
0.4000 mg | ORAL_CAPSULE | Freq: Every day | ORAL | Status: DC
  Administered 2018-03-15 – 2018-03-19 (×5): 0.4 mg via ORAL
  Filled 2018-03-14 (×5): qty 1

## 2018-03-14 MED ORDER — SODIUM CHLORIDE 0.9% FLUSH
3.0000 mL | Freq: Two times a day (BID) | INTRAVENOUS | Status: DC
  Administered 2018-03-15 – 2018-03-19 (×10): 3 mL via INTRAVENOUS

## 2018-03-14 MED ORDER — VITAMIN E 180 MG (400 UNIT) PO CAPS
400.0000 [IU] | ORAL_CAPSULE | Freq: Every day | ORAL | Status: DC
  Administered 2018-03-15 – 2018-03-18 (×4): 400 [IU] via ORAL
  Filled 2018-03-14 (×5): qty 1

## 2018-03-14 MED ORDER — SERTRALINE HCL 50 MG PO TABS
50.0000 mg | ORAL_TABLET | Freq: Every day | ORAL | Status: DC
  Administered 2018-03-15 – 2018-03-19 (×5): 50 mg via ORAL
  Filled 2018-03-14 (×5): qty 1

## 2018-03-14 MED ORDER — SODIUM CHLORIDE 0.9% FLUSH: 3.0000 mL | INTRAVENOUS | Status: DC | PRN

## 2018-03-14 MED ORDER — ACETAMINOPHEN 325 MG PO TABS
650.0000 mg | ORAL_TABLET | ORAL | Status: DC | PRN
  Administered 2018-03-16: 650 mg via ORAL
  Filled 2018-03-14: qty 2

## 2018-03-14 NOTE — Progress Notes (Signed)
EPIC Encounter for ICM Monitoring  Patient Name: Nathaniel Bolton is a 78 y.o. male Date: 03/14/2018 Primary Care Physican: [name], NP Primary Cardiologist:Jordan/[name] Electrophysiologist:Allred Last Weight:197 lbs at last office visit 03/10/2018 Today's Weight: unknown   Per Epic telephone note today, 03/14/2018, wife reported patient is at [facility] because ICD has gone off 3 times.   Patient seen 03/10/2018 by HF clinic and instructed to take metolazone with extra 40 meq of K tomorrow.  Report: Thoracic impedance abnormal since 02/25/2018 suggesting fluid accumulation.   Prescribed: Furosemide80 mgTake 1 tablet (80 mg total) by mouth 2 (two) times daily.  Potassium 10 mEqTake 6 tabs (60 meq total) by mouth twice day (120 meq total) for 2 (two) days, then take 1 tab (10 meq) by mouth twice a day (20 meq total)  Labs: 03/10/2018 Creatinine 2.12, BUN 50, Potassium 4.1, Sodium 137, GFR 29-34 01/09/2018 Creatinine 1.92, BUN 43, Potassium 3.6, Sodium 139, GFR 33-38  12/16/2017 Creatinine 1.93, BUN 45, Potassium 4.4, Sodium 130, GFR 33-38  12/10/2017 Creatinine 2.37, BUN 72, Potassium 2.7, Sodium 128, GFR 26-30  A complete set of results can be found in Results Review.  Recommendations: Patient currently hospitalized at [facility]  Follow-up plan: ICM clinic phone appointment on3/16/2020 to recheck fluid levels.   Copy of ICM check sent to Dr.Allred and Dr Haroldine Laws.  3 month ICM trend: 03/13/2018    1 Year ICM trend:       [name], RN 03/14/2018 12:27 PM

## 2018-03-14 NOTE — Telephone Encounter (Signed)
Pt wife called to let us Nathaniel Bolton was at [facility]  His ICD has gone off three times.  HR is staying low.  She is hoping they can get him transferred to [facility] @ some point.  Told her to keep in touch.

## 2018-03-15 ENCOUNTER — Inpatient Hospital Stay (HOSPITAL_COMMUNITY): Payer: Medicare HMO

## 2018-03-15 DIAGNOSIS — I340 Nonrheumatic mitral (valve) insufficiency: Secondary | ICD-10-CM

## 2018-03-15 DIAGNOSIS — E119 Type 2 diabetes mellitus without complications: Secondary | ICD-10-CM

## 2018-03-15 DIAGNOSIS — E785 Hyperlipidemia, unspecified: Secondary | ICD-10-CM

## 2018-03-15 DIAGNOSIS — I255 Ischemic cardiomyopathy: Secondary | ICD-10-CM

## 2018-03-15 DIAGNOSIS — N189 Chronic kidney disease, unspecified: Secondary | ICD-10-CM

## 2018-03-15 DIAGNOSIS — Z794 Long term (current) use of insulin: Secondary | ICD-10-CM

## 2018-03-15 DIAGNOSIS — I472 Ventricular tachycardia: Principal | ICD-10-CM

## 2018-03-15 DIAGNOSIS — I10 Essential (primary) hypertension: Secondary | ICD-10-CM

## 2018-03-15 DIAGNOSIS — N183 Chronic kidney disease, stage 3 (moderate): Secondary | ICD-10-CM

## 2018-03-15 DIAGNOSIS — E1122 Type 2 diabetes mellitus with diabetic chronic kidney disease: Secondary | ICD-10-CM

## 2018-03-15 DIAGNOSIS — I2510 Atherosclerotic heart disease of native coronary artery without angina pectoris: Secondary | ICD-10-CM

## 2018-03-15 DIAGNOSIS — I480 Paroxysmal atrial fibrillation: Secondary | ICD-10-CM

## 2018-03-15 DIAGNOSIS — I214 Non-ST elevation (NSTEMI) myocardial infarction: Secondary | ICD-10-CM

## 2018-03-15 LAB — COMPREHENSIVE METABOLIC PANEL
ALT: 24 U/L (ref 0–44)
AST: 28 U/L (ref 15–41)
Albumin: 3.4 g/dL — ABNORMAL LOW (ref 3.5–5.0)
Alkaline Phosphatase: 90 U/L (ref 38–126)
Anion gap: 8 (ref 5–15)
BUN: 30 mg/dL — ABNORMAL HIGH (ref 8–23)
CO2: 25 mmol/L (ref 22–32)
Calcium: 8.7 mg/dL — ABNORMAL LOW (ref 8.9–10.3)
Chloride: 105 mmol/L (ref 98–111)
Creatinine, Ser: 1.74 mg/dL — ABNORMAL HIGH (ref 0.61–1.24)
GFR calc Af Amer: 43 mL/min — ABNORMAL LOW (ref >60)
GFR calc non Af Amer: 37 mL/min — ABNORMAL LOW (ref >60)
Glucose, Bld: 161 mg/dL — ABNORMAL HIGH (ref 70–99)
Potassium: 3.7 mmol/L (ref 3.5–5.1)
Sodium: 138 mmol/L (ref 135–145)
Total Bilirubin: 0.8 mg/dL (ref 0.3–1.2)
Total Protein: 6.6 g/dL (ref 6.5–8.1)

## 2018-03-15 LAB — GLUCOSE, CAPILLARY: Glucose-Capillary: 204 mg/dL — ABNORMAL HIGH (ref 70–99)

## 2018-03-15 LAB — BASIC METABOLIC PANEL
Anion gap: 10 (ref 5–15)
BUN: 29 mg/dL — ABNORMAL HIGH (ref 8–23)
CO2: 27 mmol/L (ref 22–32)
Calcium: 9.1 mg/dL (ref 8.9–10.3)
Chloride: 102 mmol/L (ref 98–111)
Creatinine, Ser: 1.84 mg/dL — ABNORMAL HIGH (ref 0.61–1.24)
GFR calc Af Amer: 40 mL/min — ABNORMAL LOW (ref >60)
GFR calc non Af Amer: 35 mL/min — ABNORMAL LOW (ref >60)
Glucose, Bld: 139 mg/dL — ABNORMAL HIGH (ref 70–99)
Potassium: 4.6 mmol/L (ref 3.5–5.1)
Sodium: 139 mmol/L (ref 135–145)

## 2018-03-15 LAB — CBC WITH DIFFERENTIAL/PLATELET
Abs Immature Granulocytes: 0.04 10*3/uL (ref 0.00–0.07)
Basophils Absolute: 0.1 10*3/uL (ref 0.0–0.1)
Basophils Relative: 1 %
Eosinophils Absolute: 0.3 10*3/uL (ref 0.0–0.5)
Eosinophils Relative: 4 %
HCT: 36.1 % — ABNORMAL LOW (ref 39.0–52.0)
Hemoglobin: 11.9 g/dL — ABNORMAL LOW (ref 13.0–17.0)
Immature Granulocytes: 1 %
Lymphocytes Relative: 23 %
Lymphs Abs: 1.9 10*3/uL (ref 0.7–4.0)
MCH: 34.1 pg — ABNORMAL HIGH (ref 26.0–34.0)
MCHC: 33 g/dL (ref 30.0–36.0)
MCV: 103.4 fL — ABNORMAL HIGH (ref 80.0–100.0)
Monocytes Absolute: 0.7 10*3/uL (ref 0.1–1.0)
Monocytes Relative: 9 %
Neutro Abs: 5 10*3/uL (ref 1.7–7.7)
Neutrophils Relative %: 62 %
Platelets: 169 10*3/uL (ref 150–400)
RBC: 3.49 MIL/uL — ABNORMAL LOW (ref 4.22–5.81)
RDW: 14 % (ref 11.5–15.5)
WBC: 8 10*3/uL (ref 4.0–10.5)
nRBC: 0 % (ref 0.0–0.2)

## 2018-03-15 LAB — PROTIME-INR
INR: 1.4 — ABNORMAL HIGH (ref 0.8–1.2)
Prothrombin Time: 16.8 seconds — ABNORMAL HIGH (ref 11.4–15.2)

## 2018-03-15 LAB — TROPONIN I
Troponin I: 0.74 ng/mL (ref <0.03)
Troponin I: 0.72 ng/mL (ref <0.03)
Troponin I: 0.51 ng/mL (ref <0.03)

## 2018-03-15 LAB — MAGNESIUM: Magnesium: 2 mg/dL (ref 1.7–2.4)

## 2018-03-15 LAB — ECHOCARDIOGRAM COMPLETE
Height: 70 in
Weight: 3076.8 oz

## 2018-03-15 LAB — APTT: aPTT: 34 seconds (ref 24–36)

## 2018-03-15 LAB — BRAIN NATRIURETIC PEPTIDE: B Natriuretic Peptide: 394 pg/mL — ABNORMAL HIGH (ref 0.0–100.0)

## 2018-03-15 LAB — TSH: TSH: 2.868 u[IU]/mL (ref 0.350–4.500)

## 2018-03-15 MED ORDER — CARVEDILOL 6.25 MG PO TABS
6.2500 mg | ORAL_TABLET | Freq: Two times a day (BID) | ORAL | Status: DC
  Administered 2018-03-15 – 2018-03-19 (×8): 6.25 mg via ORAL
  Filled 2018-03-15 (×8): qty 1

## 2018-03-15 MED ORDER — SPIRONOLACTONE 25 MG PO TABS
25.00 | ORAL_TABLET | ORAL | Status: DC
Start: 2018-03-15 — End: 2018-03-15

## 2018-03-15 MED ORDER — ATORVASTATIN CALCIUM 80 MG PO TABS
80.0000 mg | ORAL_TABLET | Freq: Every day | ORAL | Status: DC
  Administered 2018-03-15 – 2018-03-18 (×4): 80 mg via ORAL
  Filled 2018-03-15 (×4): qty 1

## 2018-03-15 MED ORDER — ALLOPURINOL 100 MG PO TABS
100.00 | ORAL_TABLET | ORAL | Status: DC
Start: 2018-03-15 — End: 2018-03-15

## 2018-03-15 MED ORDER — INSULIN GLARGINE 100 UNIT/ML ~~LOC~~ SOLN
10.00 | SUBCUTANEOUS | Status: DC
Start: 2018-03-15 — End: 2018-03-15

## 2018-03-15 MED ORDER — INSULIN ASPART 100 UNIT/ML ~~LOC~~ SOLN
0.0000 [IU] | Freq: Three times a day (TID) | SUBCUTANEOUS | Status: DC
  Administered 2018-03-15 – 2018-03-16 (×2): 5 [IU] via SUBCUTANEOUS
  Administered 2018-03-16 (×2): 2 [IU] via SUBCUTANEOUS
  Administered 2018-03-17: 3 [IU] via SUBCUTANEOUS
  Administered 2018-03-17: 2 [IU] via SUBCUTANEOUS
  Administered 2018-03-17 – 2018-03-18 (×2): 3 [IU] via SUBCUTANEOUS
  Administered 2018-03-18: 5 [IU] via SUBCUTANEOUS
  Administered 2018-03-18: 8 [IU] via SUBCUTANEOUS
  Administered 2018-03-19: 5 [IU] via SUBCUTANEOUS
  Administered 2018-03-19: 2 [IU] via SUBCUTANEOUS

## 2018-03-15 MED ORDER — GLUCAGON HCL RDNA (DIAGNOSTIC) 1 MG IJ SOLR
1.00 | INTRAMUSCULAR | Status: DC
Start: ? — End: 2018-03-15

## 2018-03-15 MED ORDER — PANTOPRAZOLE SODIUM 40 MG PO TBEC
40.00 | DELAYED_RELEASE_TABLET | ORAL | Status: DC
Start: 2018-03-15 — End: 2018-03-15

## 2018-03-15 MED ORDER — INSULIN LISPRO 100 UNIT/ML ~~LOC~~ SOLN
2.00 | SUBCUTANEOUS | Status: DC
Start: 2018-03-15 — End: 2018-03-15

## 2018-03-15 MED ORDER — ATORVASTATIN CALCIUM 10 MG PO TABS
20.00 | ORAL_TABLET | ORAL | Status: DC
Start: 2018-03-15 — End: 2018-03-15

## 2018-03-15 MED ORDER — POTASSIUM CHLORIDE CRYS ER 10 MEQ PO TBCR
10.00 | EXTENDED_RELEASE_TABLET | ORAL | Status: DC
Start: 2018-03-15 — End: 2018-03-15

## 2018-03-15 MED ORDER — GLUCOSE 40 % PO GEL
15.00 | ORAL | Status: DC
Start: ? — End: 2018-03-15

## 2018-03-15 MED ORDER — FUROSEMIDE 40 MG PO TABS
80.00 | ORAL_TABLET | ORAL | Status: DC
Start: 2018-03-15 — End: 2018-03-15

## 2018-03-15 MED ORDER — AMIODARONE HCL 200 MG PO TABS
200.00 | ORAL_TABLET | ORAL | Status: DC
Start: 2018-03-15 — End: 2018-03-15

## 2018-03-15 MED ORDER — DEXTROSE 10 % IV SOLN
125.00 | INTRAVENOUS | Status: DC
Start: ? — End: 2018-03-15

## 2018-03-15 MED ORDER — POTASSIUM CHLORIDE CRYS ER 20 MEQ PO TBCR
40.0000 meq | EXTENDED_RELEASE_TABLET | Freq: Once | ORAL | Status: AC
  Administered 2018-03-15: 40 meq via ORAL
  Filled 2018-03-15: qty 2

## 2018-03-15 MED ORDER — GENERIC EXTERNAL MEDICATION
400.00 | Status: DC
Start: 2018-03-15 — End: 2018-03-15

## 2018-03-15 MED ORDER — LOSARTAN POTASSIUM 50 MG PO TABS
50.00 | ORAL_TABLET | ORAL | Status: DC
Start: 2018-03-15 — End: 2018-03-15

## 2018-03-15 MED ORDER — TAMSULOSIN HCL 0.4 MG PO CAPS
0.40 | ORAL_CAPSULE | ORAL | Status: DC
Start: 2018-03-15 — End: 2018-03-15

## 2018-03-15 MED ORDER — APIXABAN 5 MG PO TABS
5.0000 mg | ORAL_TABLET | Freq: Two times a day (BID) | ORAL | Status: DC
  Administered 2018-03-15 – 2018-03-19 (×8): 5 mg via ORAL
  Filled 2018-03-15 (×8): qty 1

## 2018-03-15 MED ORDER — INSULIN GLARGINE 100 UNIT/ML ~~LOC~~ SOLN: 10.0000 [IU] | Freq: Every day | SUBCUTANEOUS | Status: DC

## 2018-03-15 MED ORDER — APIXABAN 5 MG PO TABS
5.00 | ORAL_TABLET | ORAL | Status: DC
Start: 2018-03-15 — End: 2018-03-15

## 2018-03-15 MED ORDER — GABAPENTIN 300 MG PO CAPS
300.00 | ORAL_CAPSULE | ORAL | Status: DC
Start: 2018-03-15 — End: 2018-03-15

## 2018-03-15 MED ORDER — ASPIRIN EC 81 MG PO TBEC
81.0000 mg | DELAYED_RELEASE_TABLET | Freq: Every day | ORAL | Status: DC
  Administered 2018-03-15 – 2018-03-17 (×3): 81 mg via ORAL
  Filled 2018-03-15 (×3): qty 1

## 2018-03-15 MED ORDER — CYCLOBENZAPRINE HCL 10 MG PO TABS
10.00 | ORAL_TABLET | ORAL | Status: DC
Start: ? — End: 2018-03-15

## 2018-03-15 MED ORDER — INSULIN GLARGINE 100 UNIT/ML ~~LOC~~ SOLN
10.0000 [IU] | Freq: Every day | SUBCUTANEOUS | Status: DC
  Administered 2018-03-15 – 2018-03-18 (×4): 10 [IU] via SUBCUTANEOUS
  Filled 2018-03-15 (×5): qty 0.1

## 2018-03-15 MED ORDER — PERFLUTREN LIPID MICROSPHERE
1.0000 mL | INTRAVENOUS | Status: AC | PRN
  Administered 2018-03-15: 2 mL via INTRAVENOUS
  Filled 2018-03-15: qty 10

## 2018-03-15 MED ORDER — ALBUTEROL SULFATE (2.5 MG/3ML) 0.083% IN NEBU
2.50 | INHALATION_SOLUTION | RESPIRATORY_TRACT | Status: DC
Start: ? — End: 2018-03-15

## 2018-03-15 MED ORDER — NITROGLYCERIN 0.4 MG SL SUBL
.40 | SUBLINGUAL_TABLET | SUBLINGUAL | Status: DC
Start: ? — End: 2018-03-15

## 2018-03-15 MED ORDER — AMIODARONE HCL 200 MG PO TABS
200.0000 mg | ORAL_TABLET | Freq: Two times a day (BID) | ORAL | Status: DC
  Administered 2018-03-15 – 2018-03-19 (×10): 200 mg via ORAL
  Filled 2018-03-15 (×10): qty 1

## 2018-03-15 MED ORDER — SERTRALINE HCL 50 MG PO TABS
50.00 | ORAL_TABLET | ORAL | Status: DC
Start: 2018-03-15 — End: 2018-03-15

## 2018-03-15 MED ORDER — CARVEDILOL 3.125 MG PO TABS
3.13 | ORAL_TABLET | ORAL | Status: DC
Start: 2018-03-15 — End: 2018-03-15

## 2018-03-15 MED ORDER — INSULIN ASPART 100 UNIT/ML ~~LOC~~ SOLN: 2.0000 [IU] | Freq: Three times a day (TID) | SUBCUTANEOUS | Status: DC

## 2018-03-15 MED ORDER — ACETAMINOPHEN 325 MG PO TABS
650.00 | ORAL_TABLET | ORAL | Status: DC
Start: ? — End: 2018-03-15

## 2018-03-15 MED ORDER — PHENOL 1.4 % MT LIQD
1.0000 | OROMUCOSAL | Status: DC | PRN
  Filled 2018-03-15: qty 177

## 2018-03-15 NOTE — Progress Notes (Signed)
Rec'd patient in report without telemetry. NIght nurse reported that because patient had a ICD on one side and pacemaker on the other that the "artifact" would be too great. Dr. Rayann Heman informed.

## 2018-03-15 NOTE — H&P (Signed)
History & Physical    Patient ID: Nathaniel Bolton MRN: [id number], DOB/AGE: 1940/02/04   Admit date: 03/14/2018   Primary Physician: [name], NP Primary Cardiologist: Peter Martinique, MD  Patient Profile    Mr. Wrede is a 78 year old gentleman with CKD stage III, DM II, HTN, HLD, h/o VT s/p ICD, chronic systolic CHF with EF 95% s/p barostim device placement, severe CAD, COPD, PAF on Eliquis, and h/o CVA who presented to the [facility] ED due to ICD shocks.  Past Medical History    Past Medical History:  Diagnosis Date  . CAD (coronary artery disease)   . CKD stage 3 due to type 2 diabetes mellitus ([place])   . Diabetes mellitus    Type 2  . Erectile dysfunction   . Gout   . Hyperlipidemia   . Hypertension   . ICD (implantable cardiac defibrillator) in place   . Myocardial infarction, old    x2 in [place]. S/P CABG 2003, Dr Roxy Manns  . Obstructive lung disease (HCC)    Moderate per prior PFT's  . Paroxysmal atrial fibrillation ([place]) 3/17   discovered on event monitor  . Stroke [facility])    2013  . Systolic CHF, acute on chronic (HCC)    EF is 27% per myoview and echo October 2012  . Ventricular tachycardia ([place]) 08/2014   175 bpm    Past Surgical History:  Procedure Laterality Date  . BYPASS GRAFT     x5  . CARDIAC DEFIBRILLATOR PLACEMENT  2008   Medronic by Dr Elonda Husky at [facility]  . cataract surgery    . CHOLECYSTECTOMY    . COLONOSCOPY  12/23/2006   Small colonic polyps, status post polypectomy. Interanl hemorrhoids.   . CORONARY ARTERY BYPASS GRAFT    . EYE SURGERY    . HERNIA REPAIR    . ICD GENERATOR CHANGEOUT N/A 08/30/2016   MDT Visia AF VR ICD implanted by Dr Rayann Heman  . KNEE ARTHROSCOPY     right  . UMBILICAL HERNIA REPAIR       Allergies  Allergies  Allergen Reactions  . Adhesive [Tape] Itching and Rash    Testosterone patch adhesive    History of Present Illness    Nathaniel Bolton is a 78 year old gentleman with CKD stage III, DM II,  HTN, HLD, h/o VT s/p ICD, chronic systolic CHF with EF 18% s/p barostim device placement, severe CAD, COPD, PAF on Eliquis, and h/o CVA who presented to the [facility] ED due to ICD shocks.  The patient reports that in the days leading up to admission he had symptoms of indigestion but no chest pain. On the evening of Thursday 3/5, he went to dinner with his wife and during dinner suddenly felt weak and tremulous. He quickly lost consciousness and wife reports ICD shock. EMS was called immediately but patient again lost consciousness and was shocked while being loaded onto the stretcher and then a final time while en route to [facility]. He underwent work-up which showed stable CKD and electrolytes, Troponin elevation to 1.35 --> 1.57 --> 1.41. He was started on an amiodarone infusion which was subsequently transitioned to 200mg  BID amiodarone in the setting of bradycardia with infusion. TTE was performed which showed EF 25%, RVSP 40 mmHg and mild TR. He was then transferred to [facility] for further evaluation.  Upon arrival, the patient reports feeling well. No further ICD shocks since Thursday evening. Denies chest pain,  shortness of breath, orthopnea, PND, LEE, weight gain.   Home Medications    Prior to Admission medications   Medication Sig Start Date End Date Taking? Authorizing Provider  allopurinol (ZYLOPRIM) 100 MG tablet Take 100 mg by mouth daily.    [provider]  amiodarone (PACERONE) 200 MG tablet Take 1 tablet (200 mg total) by mouth daily. 10/15/16   Martinique, Peter M, MD  apixaban (ELIQUIS) 5 MG TABS tablet Take 1 tablet (5 mg total) by mouth 2 (two) times daily. 10/15/16   Martinique, Peter M, MD  Ascorbic Acid (VITAMIN C) 1000 MG tablet Take 1,000 mg by mouth daily.    [provider]  atorvastatin (LIPITOR) 20 MG tablet Take 1 tablet (20 mg total) by mouth daily. Patient taking differently: Take 20 mg by mouth daily at 6 PM.  10/15/16   Martinique, Peter M, MD  carvedilol  (COREG) 3.125 MG tablet Take 1 tablet (3.125 mg total) by mouth 2 (two) times daily with a meal. Patient taking differently: Take 3.125 mg by mouth 2 (two) times daily with a meal. Take 1/2 tablet daily 11/18/17   Roxan Hockey, MD  Cholecalciferol (VITAMIN D3) 2000 units TABS Take 2,000 Units by mouth 2 (two) times daily.     [provider]  cyclobenzaprine (FLEXERIL) 10 MG tablet Take 10 mg by mouth 2 (two) times daily.    [provider]  furosemide (LASIX) 80 MG tablet Take 1 tablet (80 mg total) by mouth 2 (two) times daily. 11/18/17 11/13/18  Roxan Hockey, MD  gabapentin (NEURONTIN) 300 MG capsule Take 300 mg by mouth 3 (three) times daily.    [facility]  insulin aspart (NOVOLOG FLEXPEN) 100 UNIT/ML FlexPen inject 3 units if sugar is >151 ac meal; inject 2 units if sugar is btween 110 & 150, Do not inject any insulin  if your sugar is < 110 11/18/17   Emokpae, Courage, MD  insulin glargine (LANTUS) 100 unit/mL SOPN Inject 0.1 mLs (10 Units total) into the skin daily. 11/18/17   Roxan Hockey, MD  Lactobacillus (PROBIOTIC ACIDOPHILUS PO) Take 1 capsule by mouth 2 (two) times daily.    [provider]  losartan (COZAAR) 50 MG tablet Take 1 tablet (50 mg total) by mouth daily. 11/19/17   Roxan Hockey, MD  meloxicam (MOBIC) 15 MG tablet Take 15 mg by mouth daily.    [provider]  metolazone (ZAROXOLYN) 2.5 MG tablet Take 1 tablet (2.5 mg total) by mouth as directed. 03/10/18 06/08/18  [name], PA-C  NITROSTAT 0.4 MG SL tablet Place 0.4 mg under the tongue every 5 (five) minutes as needed for chest pain (MAX 3 TABLETS).  08/17/13   [provider]  NON FORMULARY Needles, syringes with needles, glucose reagent test strips    [provider]  Omega-3 Fatty Acids (FISH OIL PO) Take 2 tablets by mouth 2 (two) times daily.     [provider]  omeprazole (PRILOSEC) 20 MG capsule Take 20 mg by mouth daily.     [provider]  potassium chloride (K-DUR) 10 MEQ tablet Take 6 tabs (60 meq total) by mouth twice day (120 meq total) for 2 (two) days, then take 1 tab (10 meq) by mouth twice a day (20 meq total) 12/13/17   Tillery, Satira Mccallum, PA-C  Saw Palmetto, Serenoa repens, (SAW PALMETTO PO) Take 1 tablet by mouth daily.     [provider]  sertraline (ZOLOFT) 100 MG tablet Take  50 mg by mouth daily. Take 1/2 tablet daily    [provider]  spironolactone (ALDACTONE) 25 MG tablet Take 1 tablet (25 mg total) by mouth at bedtime. 03/10/18   [name], PA-C  tamsulosin (FLOMAX) 0.4 MG CAPS capsule Take 0.4 mg by mouth daily.    [provider]  VENTOLIN HFA 108 [phone] Base) MCG/ACT inhaler  04/26/17   [provider]  vitamin E 400 UNIT capsule Take 400 Units by mouth daily.     [provider]    Family History    Family History  Problem Relation Age of Onset  . Heart failure Mother   . Heart attack Father   . Heart disease Sister        valve replaced  . Heart attack Brother        CABG, aortic grafting   He indicated that his mother is deceased. He indicated that his father is deceased. He indicated that both of his sisters are alive. He indicated that his brother is alive. He indicated that his maternal grandmother is deceased. He indicated that his maternal grandfather is deceased. He indicated that his paternal grandmother is deceased. He indicated that his paternal grandfather is deceased.   Social History    Social History   Socioeconomic History  . Marital status: Married    Spouse name: Not on file  . Number of children: 4  . Years of education: Not on file  . Highest education level: Not on file  Occupational History  . Occupation: heavy Radio producer  Social Needs  . Financial resource strain: Not on file  . Food insecurity:    Worry: Not on file    Inability: Not on file  . Transportation needs:     Medical: Not on file    Non-medical: Not on file  Tobacco Use  . Smoking status: Former Smoker    Last attempt to quit: 1991    Years since quitting: 29.2  . Smokeless tobacco: Never Used  Substance and Sexual Activity  . Alcohol use: No  . Drug use: No  . Sexual activity: Not on file  Lifestyle  . Physical activity:    Days per week: Not on file    Minutes per session: Not on file  . Stress: Not on file  Relationships  . Social connections:    Talks on phone: Not on file    Gets together: Not on file    Attends religious service: Not on file    Active member of club or organization: Not on file    Attends meetings of clubs or organizations: Not on file    Relationship status: Not on file  . Intimate partner violence:    Fear of current or ex partner: Not on file    Emotionally abused: Not on file    Physically abused: Not on file    Forced sexual activity: Not on file  Other Topics Concern  . Not on file  Social History Narrative  . Not on file     Review of Systems    General:  No chills, fever, night sweats or weight changes.  Cardiovascular:  As per HPI Dermatological: No rash, lesions/masses Respiratory: No cough, dyspnea Urologic: No hematuria, dysuria Abdominal:   No nausea, vomiting, diarrhea, bright red blood per rectum, melena, or hematemesis Neurologic:  No visual changes, wkns, changes in mental status. All other systems reviewed and are otherwise negative except as noted above.  Physical  Exam    Blood pressure (!) 142/63, pulse 65, temperature 98.2 F (36.8 C), temperature source Oral, resp. rate 16, height 5\' 10"  (1.778 m), weight 87.2 kg, SpO2 96 %.  General: Pleasant, NAD Psych: Normal affect. Neuro: Alert and oriented X 3. Moves all extremities spontaneously. HEENT: Normal  Neck: Supple without bruits or JVD. Lungs:  Resp regular and unlabored, CTA. Heart: Regular rate and rhythm. No s3, s4, or murmurs. Abdomen: Soft, non-tender,  non-distended, BS + x 4.  Extremities: No clubbing, cyanosis or edema. DP/PT/Radials 2+ and equal bilaterally.  Labs    Troponin (Point of Care Test) No results for input(s): TROPIPOC in the last 72 hours. Recent Labs    03/15/18 0101  TROPONINI 0.74*   Lab Results  Component Value Date   WBC 8.0 03/15/2018   HGB 11.9 (L) 03/15/2018   HCT 36.1 (L) 03/15/2018   MCV 103.4 (H) 03/15/2018   PLT 169 03/15/2018    Recent Labs  Lab 03/15/18 0101  NA 138  K 3.7  CL 105  CO2 25  BUN 30*  CREATININE 1.74*  CALCIUM 8.7*  PROT 6.6  BILITOT 0.8  ALKPHOS 90  ALT 24  AST 28  GLUCOSE 161*   No results found for: CHOL, HDL, LDLCALC, TRIG No results found for: [facility]   Radiology Studies    Dg Chest Port 1 View  Result Date: 03/15/2018 CLINICAL DATA:  Heart failure. EXAM: PORTABLE CHEST 1 VIEW COMPARISON:  Radiographs 03/13/2018 FINDINGS: Post median sternotomy and CABG. Stable or decreased cardiomegaly from recent exam. Left-sided pacemaker in place. Minimal septal thickening suggesting pulmonary edema, new from prior. Bibasilar atelectasis or scarring. No large pleural effusion or pneumothorax. Additional battery pack with lead coursing into the neck is partially included. IMPRESSION: Mild septal thickening suggesting pulmonary edema. Stable or decreased cardiomegaly from radiographs 2 days ago. Electronically Signed   By: Keith Rake M.D.   On: 03/15/2018 00:37    ECG & Cardiac Imaging    ECG 03/15/18 - personally reviewed. SR with PVCs.  Cath 07/2015 at [facility] showed EF 20%, patent LIMA to LAD, patent SVG to RCA, occluded SVG to OM. Discussed with Dr. Nile Riggs see if patient need redo CABG, however not candidate. Cath film was reviewed by Dr. Martinique, left circumflex artery had a 90% ostial stenosis, mid left circumflex on the OM spur occluded, however no good target for PCI  TTE at [facility]: EF 25%, RVSP 40, mild TR  Assessment & Plan    Nathaniel Bolton is a 78  year old gentleman with CKD stage III, DM II, HTN, HLD, h/o VT s/p ICD, chronic systolic CHF with EF 42% s/p barostim device placement, severe CAD, COPD, PAF on Eliquis, and h/o CVA who presented to the [facility] ED due to ICD shocks. Found to have NSTEMI.  # ICD Shocks # ICM # NSTEMI # CAD s/p CABG Patient with known ICM but no clear additional trigger for arrhythmia at the time of admission. Has complained of recent indigestion which could represent anginal equivalent in setting of known unrevascularized severe coronary artery disease. Coronary angiography films from [facility] in 2017 have been reviewed previously but patient did not have a good target for PCI. Does not appear volume overloaded, no significant electrolyte abnormalities.  - Will have device interrogated in AM - Continue amiodarone 200mg  BID - Will consider repeat LHC after review of prior angiography films, in the meantime will continue medical management for CAD - Cont  ASA, carvedilol - Cont losartan, spironolactone, lasix, potassium supplementation - Increase home atorvastatin to 80mg   - Cont amiodarone 200mg  BID  - EP consult for recurrent ICD shocks - TFTs pending  # CKD Baseline Cr approximately 1.6. Minimally elevated at 1.7 on arrival. - Cont to monitor - Avoid nephrotoxic meds - Renally dose meds  # DMII - Insulin glargine daily - Sliding scale insulin  # HTN Blood pressure mildly elevated on admission - Cont home meds as above, consider additional titration if remains elevated  # HL - Increase atorvastatin to 80mg  as above - Lipid panel pending  # FULL CODE  Signed, Bryna Colander, MD 03/15/2018, 2:48 AM

## 2018-03-15 NOTE — Consult Note (Signed)
ELECTROPHYSIOLOGY CONSULT NOTE    Primary Care Physician: [name], NP Referring Physician:  Dr Georgette Shell  Admit Date: 03/14/2018  Reason for consultation:  ICD shocks  Nathaniel RADELL is a 78 y.o. male with a h/o ischemic CM s/p CABG, DM, CRI, HTN, and ventricular tachycardia who is admitted after receiving ICD shocks.  The patient reports on 03/13/2018 having abrupt onset of weakness while having dinner with his spouse.  He had syncope with an associated ICD shock.  He was placed on IV amiodarone initially and then transitioned to PO.  He has been transferred to [facility] for further evaluation.  He has done well since transfer without additional arrhythmias. He has had VT in 2016 for which he has been chronically maintained on amiodarone.  Today, he denies symptoms of palpitations, chest pain, shortness of breath, orthopnea, PND, lower extremity edema, dizziness, presyncope, syncope, or neurologic sequela. The patient is tolerating medications without difficulties and is otherwise without complaint today.   Past Medical History:  Diagnosis Date  . CAD (coronary artery disease)   . CKD stage 3 due to type 2 diabetes mellitus ([place])   . Diabetes mellitus    Type 2  . Erectile dysfunction   . Gout   . Hyperlipidemia   . Hypertension   . ICD (implantable cardiac defibrillator) in place   . Myocardial infarction, old    x2 in [place]. S/P CABG 2003, Dr Roxy Manns  . Obstructive lung disease (HCC)    Moderate per prior PFT's  . Paroxysmal atrial fibrillation ([place]) 3/17   discovered on event monitor  . Stroke [facility])    2013  . Systolic CHF, acute on chronic (HCC)    EF is 27% per myoview and echo October 2012  . Ventricular tachycardia ([place]) 08/2014   175 bpm   Past Surgical History:  Procedure Laterality Date  . BYPASS GRAFT     x5  . CARDIAC DEFIBRILLATOR PLACEMENT  2008   Medronic by Dr Elonda Husky at [facility]  . cataract surgery    . CHOLECYSTECTOMY    . COLONOSCOPY   12/23/2006   Small colonic polyps, status post polypectomy. Interanl hemorrhoids.   . CORONARY ARTERY BYPASS GRAFT    . EYE SURGERY    . HERNIA REPAIR    . ICD GENERATOR CHANGEOUT N/A 08/30/2016   MDT Visia AF VR ICD implanted by Dr Rayann Heman  . KNEE ARTHROSCOPY     right  . UMBILICAL HERNIA REPAIR      . allopurinol  100 mg Oral Daily  . amiodarone  200 mg Oral BID  . aspirin EC  81 mg Oral Daily  . atorvastatin  80 mg Oral q1800  . carvedilol  3.125 mg Oral BID WC  . cholecalciferol  2,000 Units Oral BID  . furosemide  80 mg Oral BID  . gabapentin  300 mg Oral TID  . insulin glargine  10 Units Subcutaneous Daily  . losartan  50 mg Oral Daily  . pantoprazole  40 mg Oral Daily  . potassium chloride  10 mEq Oral BID  . sertraline  50 mg Oral Daily  . sodium chloride flush  3 mL Intravenous Q12H  . spironolactone  25 mg Oral QHS  . tamsulosin  0.4 mg Oral Daily  . vitamin C  1,000 mg Oral Daily  . vitamin E  400 Units Oral Daily   . sodium chloride      Allergies  Allergen Reactions  .  Adhesive [Tape] Itching and Rash    Testosterone patch adhesive Please use paper tape    Social History   Socioeconomic History  . Marital status: Married    Spouse name: Not on file  . Number of children: 4  . Years of education: Not on file  . Highest education level: Not on file  Occupational History  . Occupation: heavy Radio producer  Social Needs  . Financial resource strain: Not on file  . Food insecurity:    Worry: Not on file    Inability: Not on file  . Transportation needs:    Medical: Not on file    Non-medical: Not on file  Tobacco Use  . Smoking status: Former Smoker    Last attempt to quit: 1991    Years since quitting: 29.2  . Smokeless tobacco: Never Used  Substance and Sexual Activity  . Alcohol use: No  . Drug use: No  . Sexual activity: Not on file  Lifestyle  . Physical activity:    Days per week: Not on file    Minutes per session: Not on file    . Stress: Not on file  Relationships  . Social connections:    Talks on phone: Not on file    Gets together: Not on file    Attends religious service: Not on file    Active member of club or organization: Not on file    Attends meetings of clubs or organizations: Not on file    Relationship status: Not on file  . Intimate partner violence:    Fear of current or ex partner: Not on file    Emotionally abused: Not on file    Physically abused: Not on file    Forced sexual activity: Not on file  Other Topics Concern  . Not on file  Social History Narrative  . Not on file    Family History  Problem Relation Age of Onset  . Heart failure Mother   . Heart attack Father   . Heart disease Sister        valve replaced  . Heart attack Brother        CABG, aortic grafting    ROS- All systems are reviewed and negative except as per the HPI above  Physical Exam: Telemetry: Vitals:   03/14/18 2315 03/14/18 2329 03/15/18 0549  BP:  (!) 142/63 137/84  Pulse:  65 60  Resp:  16 16  Temp:  98.2 F (36.8 C) 97.6 F (36.4 C)  TempSrc:  Oral   SpO2:  96% 100%  Weight: 87.2 kg    Height: 5\' 10"  (1.778 m)      GEN- The patient is well appearing, alert and oriented x 3 today.   Head- normocephalic, atraumatic Eyes-  Sclera clear, conjunctiva pink Ears- hearing intact Oropharynx- clear Neck- supple, no JVP Lymph- no cervical lymphadenopathy Lungs- Clear to ausculation bilaterally, normal work of breathing Heart- Regular rate and rhythm, no murmurs, rubs or gallops, PMI not laterally displaced GI- soft, NT, ND, + BS Extremities- no clubbing, cyanosis, or edema MS- no significant deformity or atrophy Skin- no rash or lesion Psych- euthymic mood, full affect Neuro- strength and sensation are intact  EKG-  Sinus rhythm, first degree AV block with IVCD (QRS <130 msec), PVCs  Labs:   Lab Results  Component Value Date   WBC 8.0 03/15/2018   HGB 11.9 (L) 03/15/2018   HCT 36.1  (L) 03/15/2018   MCV 103.4 (H) 03/15/2018  PLT 169 03/15/2018    Recent Labs  Lab 03/15/18 0101 03/15/18 0601  NA 138 139  K 3.7 4.6  CL 105 102  CO2 25 27  BUN 30* 29*  CREATININE 1.74* 1.84*  CALCIUM 8.7* 9.1  PROT 6.6  --   BILITOT 0.8  --   ALKPHOS 90  --   ALT 24  --   AST 28  --   GLUCOSE 161* 139*   Lab Results  Component Value Date   TROPONINI 0.72 ([place]) 03/15/2018   No results found for: CHOL No results found for: HDL No results found for: LDLCALC No results found for: TRIG No results found for: CHOLHDL No results found for: LDLDIRECT     Echo:  pending  ASSESSMENT AND PLAN:   1. VT ICD interrogation is personally reviewed this am.  This demonstrates normal device function.  He has had multiple episodes of VT with CL of 380-400 msec.  ATP  (burst and ramp) were both unsuccessful in terminating tachycardia.  ICD shock therapy was successful.  There were episodes of VT misclassified as SVT by morphology discriminator due to morphology match of 60s and 70%.  I have therefore reprogrammed morphology discriminator match to from 70 to 79% today.  l have also adjusted VT RX 1 burst from 88% to 84% and VT RX2 ramp from 91% to 94% to make burst therapy more aggressive and ramp therapy less aggressive. No driving x 6 months (he currently does not drive due to prior stroke). Continue amiodarone 200mg  BID Increase coreg to 6.25 mg BID We did discuss ablation.  There were concerns for LV thrombus on prior echo which would reduce my enthusiasm for ablation.  He has repeat echo planned for this admission.  He has been on eliquis chronically and therefore may be able to have ablation at some point.  2. CAD s/p CABG He has known advanced CAD, with no targets amenable to therapy.  Risks of cath increased with CRI.  May be worth having Dr Martinique review cath films to see if he thinks intervention would be feasible, though he really does not appear to have targets.  3. afib No afib  since ICD implant On eliquis given prior stroke   4. H/o stroke On eliquis  5. CRI Stable No change required today  6. HTN Stable No change required today  7. OSA Compliance with CPAP encouraged  8. DM Pharmacy to resume home regimen.  PT to see Hopefully home in next 24 hours  Thompson Grayer, MD 03/15/2018  11:11 AM

## 2018-03-15 NOTE — Progress Notes (Signed)
CRITICAL VALUE ALERT  Critical Value:  Troponin 0.74  Date & Time Notied:  03/15/2018 @ 0216  Provider Notified: MD Georgette Shell.  Orders Received/Actions taken:

## 2018-03-15 NOTE — Progress Notes (Signed)
2D Echocardiogram has been performed.  Nathaniel Bolton 03/15/2018, 12:33 PM

## 2018-03-16 ENCOUNTER — Other Ambulatory Visit: Payer: Self-pay

## 2018-03-16 ENCOUNTER — Encounter (HOSPITAL_COMMUNITY): Payer: Self-pay | Admitting: Surgery

## 2018-03-16 LAB — BASIC METABOLIC PANEL
Anion gap: 9 (ref 5–15)
BUN: 30 mg/dL — ABNORMAL HIGH (ref 8–23)
CO2: 25 mmol/L (ref 22–32)
Calcium: 8.7 mg/dL — ABNORMAL LOW (ref 8.9–10.3)
Chloride: 100 mmol/L (ref 98–111)
Creatinine, Ser: 1.83 mg/dL — ABNORMAL HIGH (ref 0.61–1.24)
GFR calc Af Amer: 41 mL/min — ABNORMAL LOW (ref >60)
GFR calc non Af Amer: 35 mL/min — ABNORMAL LOW (ref >60)
Glucose, Bld: 152 mg/dL — ABNORMAL HIGH (ref 70–99)
Potassium: 4.4 mmol/L (ref 3.5–5.1)
Sodium: 134 mmol/L — ABNORMAL LOW (ref 135–145)

## 2018-03-16 LAB — GLUCOSE, CAPILLARY
GLUCOSE-CAPILLARY: 129 mg/dL — AB (ref 70–99)
Glucose-Capillary: 219 mg/dL — ABNORMAL HIGH (ref 70–99)
Glucose-Capillary: 133 mg/dL — ABNORMAL HIGH (ref 70–99)
Glucose-Capillary: 268 mg/dL — ABNORMAL HIGH (ref 70–99)

## 2018-03-16 MED ORDER — GUAIFENESIN-DM 100-10 MG/5ML PO SYRP
5.0000 mL | ORAL_SOLUTION | ORAL | Status: DC | PRN
  Administered 2018-03-16 – 2018-03-19 (×8): 5 mL via ORAL
  Filled 2018-03-16 (×8): qty 5

## 2018-03-16 NOTE — Evaluation (Signed)
Physical Therapy Evaluation Patient Details Name: Nathaniel Bolton MRN: [id number] DOB: 17-Dec-1940 Today's Date: 03/16/2018   History of Present Illness  Patient is a 78 y/o male presenting following multiple ICD shocks. Admitted for ventricular tachycardia and further work-up. PMH significant of CKD stage III, DM II, HTN, HLD, h/o VT s/p ICD, chronic systolic CHF with EF 08% s/p barostim device placement, severe CAD, COPD, PAF on Eliquis, and h/o CVA.     Clinical Impression  Nathaniel Bolton is a very pleasant 78 y/o male admitted with the above listed diagnosis. Patient reports Mod I with mobility prior to admission, however, with a history of falls. Patient today requiring Min guard/Min A for mobility with 2 small LOB noted with gait, otherwise did not require physical assist for mobility. Discussion with patient and wife regarding recommendations of OPPT at discharge to further progress strength and balance. VSS with mobility with no subjective complaints from patient. PT to continue to follow acutely.      Follow Up Recommendations Outpatient PT;Supervision - Intermittent    Equipment Recommendations  None recommended by PT    Recommendations for Other Services       Precautions / Restrictions Precautions Precautions: Fall Restrictions Weight Bearing Restrictions: No      Mobility  Bed Mobility               General bed mobility comments: up in chair  Transfers Overall transfer level: Needs assistance Equipment used: Rolling walker (2 wheeled) Transfers: Sit to/from [facility] Sit to Stand: Min guard Stand pivot transfers: Min guard       General transfer comment: for safety and immediate standing balance  Ambulation/Gait Ambulation/Gait assistance: Min assist;Min guard Gait Distance (Feet): 180 Feet Assistive device: Rolling walker (2 wheeled) Gait Pattern/deviations: Step-through pattern;Decreased stride length;Trunk flexed Gait velocity:  decreased   General Gait Details: mild instability with gait; 2 small LOB; history of falls  Stairs            Wheelchair Mobility    Modified Rankin (Stroke Patients Only)       Balance Overall balance assessment: Needs assistance Sitting-balance support: No upper extremity supported;Feet supported Sitting balance-Leahy Scale: Good     Standing balance support: Bilateral upper extremity supported;During functional activity Standing balance-Leahy Scale: Poor Standing balance comment: 2 small LOB with mobility - up to Min A for safety and stability                             Pertinent Vitals/Pain Pain Assessment: No/denies pain    Home Living Family/patient expects to be discharged to:: Private residence Living Arrangements: Spouse/significant other Available Help at Discharge: Family;Available 24 hours/day Type of Home: House Home Access: Level entry     Home Layout: One level Home Equipment: Cane - single point;Bedside commode;Walker - 4 wheels      Prior Function Level of Independence: Independent with assistive device(s)         Comments: cane vs rollator     Hand Dominance        Extremity/Trunk Assessment   Upper Extremity Assessment Upper Extremity Assessment: Defer to OT evaluation    Lower Extremity Assessment Lower Extremity Assessment: Generalized weakness    Cervical / Trunk Assessment Cervical / Trunk Assessment: Normal  Communication   Communication: No difficulties  Cognition Arousal/Alertness: Awake/alert Behavior During Therapy: WFL for tasks assessed/performed Overall Cognitive Status: Within Functional Limits for tasks assessed  General Comments General comments (skin integrity, edema, etc.): wife present and supportive    Exercises     Assessment/Plan    PT Assessment Patient needs continued PT services  PT Problem List Decreased  strength;Decreased activity tolerance;Decreased balance;Decreased mobility;Decreased knowledge of use of DME;Decreased safety awareness       PT Treatment Interventions DME instruction;Gait training;Functional mobility training;Therapeutic activities;Therapeutic exercise;Balance training;Patient/family education    PT Goals (Current goals can be found in the Care Plan section)  Acute Rehab PT Goals Patient Stated Goal: regain strength and balance PT Goal Formulation: With patient Time For Goal Achievement: 03/30/18 Potential to Achieve Goals: Good    Frequency Min 3X/week   Barriers to discharge        Co-evaluation               AM-PAC PT "6 Clicks" Mobility  Outcome Measure Help needed turning from your back to your side while in a flat bed without using bedrails?: A Little Help needed moving from lying on your back to sitting on the side of a flat bed without using bedrails?: A Little Help needed moving to and from a bed to a chair (including a wheelchair)?: A Little Help needed standing up from a chair using your arms (e.g., wheelchair or bedside chair)?: A Little Help needed to walk in hospital room?: A Little Help needed climbing 3-5 steps with a railing? : A Lot 6 Click Score: 17    End of Session Equipment Utilized During Treatment: Gait belt Activity Tolerance: Patient tolerated treatment well Patient left: in chair;with call bell/phone within reach;with family/visitor present Nurse Communication: Mobility status PT Visit Diagnosis: Unsteadiness on feet (R26.81);Other abnormalities of gait and mobility (R26.89);Muscle weakness (generalized) (M62.81)    Time: 9147-8295 PT Time Calculation (min) (ACUTE ONLY): 23 min   Charges:   PT Evaluation $PT Eval Moderate Complexity: 1 Mod PT Treatments $Gait Training: 8-22 mins       Lanney Gins, PT, DPT Supplemental Physical Therapist 03/16/18 10:12 AM Pager: [phone] Office: [phone]

## 2018-03-16 NOTE — Progress Notes (Signed)
Clarification for Monday team: Dr. Rayann Heman requests rounding chart change to team A for tomorrow as he would like Dr. Martinique to see pt.

## 2018-03-16 NOTE — Progress Notes (Signed)
Progress Note   Subjective   Doing well today, no further arrhythmias.  He did have some chest pressure walking in the halls this morning.  Inpatient Medications    Scheduled Meds: . allopurinol  100 mg Oral Daily  . amiodarone  200 mg Oral BID  . apixaban  5 mg Oral BID  . aspirin EC  81 mg Oral Daily  . atorvastatin  80 mg Oral q1800  . carvedilol  6.25 mg Oral BID WC  . cholecalciferol  2,000 Units Oral BID  . furosemide  80 mg Oral BID  . gabapentin  300 mg Oral TID  . insulin aspart  0-15 Units Subcutaneous TID WC  . insulin glargine  10 Units Subcutaneous QHS  . losartan  50 mg Oral Daily  . pantoprazole  40 mg Oral Daily  . potassium chloride  10 mEq Oral BID  . sertraline  50 mg Oral Daily  . sodium chloride flush  3 mL Intravenous Q12H  . spironolactone  25 mg Oral QHS  . tamsulosin  0.4 mg Oral Daily  . vitamin C  1,000 mg Oral Daily  . vitamin E  400 Units Oral Daily   Continuous Infusions: . sodium chloride     PRN Meds: sodium chloride, acetaminophen, guaiFENesin-dextromethorphan, ondansetron (ZOFRAN) IV, phenol, sodium chloride flush   Vital Signs    Vitals:   03/15/18 1936 03/15/18 2100 03/16/18 0000 03/16/18 0518  BP: (!) 104/49  124/64 122/67  Pulse: (!) 56 (!) 59 62 69  Resp: 18  20 18   Temp: 97.9 F (36.6 C)  98.4 F (36.9 C) (!) 97.5 F (36.4 C)  TempSrc: Oral  Oral Oral  SpO2: 99%  99% 95%  Weight:    87.6 kg  Height:        Intake/Output Summary (Last 24 hours) at 03/16/2018 0850 Last data filed at 03/16/2018 0700 Gross per 24 hour  Intake 850 ml  Output 1825 ml  Net -975 ml   Filed Weights   03/14/18 2315 03/16/18 0518  Weight: 87.2 kg 87.6 kg    Telemetry    No VT, artifact from Beat HF device limits interpretation - Personally Reviewed  Physical Exam   GEN- The patient is chronically ill appearing, alert and oriented x 3 today.   Head- normocephalic, atraumatic Eyes-  Sclera clear, conjunctiva pink Ears- hearing  intact Oropharynx- clear Neck- supple, Lungs- Clear to ausculation bilaterally, normal work of breathing Heart- Regular rate and rhythm  GI- soft, NT, ND, + BS Extremities- no clubbing, cyanosis, or edema  MS- no significant deformity or atrophy Skin- no rash or lesion Psych- euthymic mood, full affect Neuro- strength and sensation are intact   Labs    Chemistry Recent Labs  Lab 03/15/18 0101 03/15/18 0601 03/16/18 0711  NA 138 139 134*  K 3.7 4.6 4.4  CL 105 102 100  CO2 25 27 25   GLUCOSE 161* 139* 152*  BUN 30* 29* 30*  CREATININE 1.74* 1.84* 1.83*  CALCIUM 8.7* 9.1 8.7*  PROT 6.6  --   --   ALBUMIN 3.4*  --   --   AST 28  --   --   ALT 24  --   --   ALKPHOS 90  --   --   BILITOT 0.8  --   --   GFRNONAA 37* 35* 35*  GFRAA 43* 40* 41*  ANIONGAP 8 10 9      Hematology Recent Labs  Lab 03/15/18 0101  WBC 8.0  RBC 3.49*  HGB 11.9*  HCT 36.1*  MCV 103.4*  MCH 34.1*  MCHC 33.0  RDW 14.0  PLT 169    Cardiac Enzymes Recent Labs  Lab 03/15/18 0101 03/15/18 0601 03/15/18 1112  TROPONINI 0.74* 0.72* 0.51*   No results for input(s): TROPIPOC in the last 168 hours.      Assessment & Plan    1.  VT No further episodes since increasing his amiodarone and coreg Would continue current regimen at discharge Follow-up with me 4 weeks post discharge.  Continue amiodarone 200mg  BID until then  2. CAD s/p CABG He has known CAD with prior CABG.  He had "indigestion" prior to his VT which in retrospect he thinks was angina.  He also had chest discomfort this am with ambulation. As Dr Martinique knows him well, I will ask Dr Martinique to see in the AM and review prior cath films to see if further revascularization needs to be considered.  This is a challenging issue in the setting of renal disease.  3. LV thrombus Not noted on echo yesterday.  4. afib No afib since ICD implant On eliquis given prior stroke  5. CRI Stable No change required today  6.  HTN Stable No change required today  7. OSA Compliant with CPAP  8. DM Resume home medicine  9. Deconditioning PT is seeing him this am  No further inpatient EP workup planned Dr Martinique to see in am regarding complicated CAD I will see in the office in 4 weeks  Thompson Grayer MD, [facility] 03/16/2018 8:50 AM

## 2018-03-17 ENCOUNTER — Inpatient Hospital Stay (HOSPITAL_COMMUNITY): Payer: Medicare HMO

## 2018-03-17 DIAGNOSIS — I5023 Acute on chronic systolic (congestive) heart failure: Secondary | ICD-10-CM

## 2018-03-17 DIAGNOSIS — Z95810 Presence of automatic (implantable) cardiac defibrillator: Secondary | ICD-10-CM

## 2018-03-17 DIAGNOSIS — I2583 Coronary atherosclerosis due to lipid rich plaque: Secondary | ICD-10-CM

## 2018-03-17 LAB — BASIC METABOLIC PANEL
Anion gap: 11 (ref 5–15)
BUN: 36 mg/dL — ABNORMAL HIGH (ref 8–23)
CO2: 25 mmol/L (ref 22–32)
Calcium: 8.6 mg/dL — ABNORMAL LOW (ref 8.9–10.3)
Chloride: 98 mmol/L (ref 98–111)
Creatinine, Ser: 2.47 mg/dL — ABNORMAL HIGH (ref 0.61–1.24)
GFR calc Af Amer: 28 mL/min — ABNORMAL LOW (ref >60)
GFR calc non Af Amer: 24 mL/min — ABNORMAL LOW (ref >60)
Glucose, Bld: 192 mg/dL — ABNORMAL HIGH (ref 70–99)
Potassium: 4 mmol/L (ref 3.5–5.1)
Sodium: 134 mmol/L — ABNORMAL LOW (ref 135–145)

## 2018-03-17 LAB — CBC WITH DIFFERENTIAL/PLATELET
Abs Immature Granulocytes: 0.05 10*3/uL (ref 0.00–0.07)
Basophils Absolute: 0 10*3/uL (ref 0.0–0.1)
Basophils Relative: 0 %
Eosinophils Absolute: 0.2 10*3/uL (ref 0.0–0.5)
Eosinophils Relative: 2 %
HCT: 30.7 % — ABNORMAL LOW (ref 39.0–52.0)
Hemoglobin: 10.3 g/dL — ABNORMAL LOW (ref 13.0–17.0)
Immature Granulocytes: 0 %
Lymphocytes Relative: 12 %
Lymphs Abs: 1.5 10*3/uL (ref 0.7–4.0)
MCH: 34.8 pg — AB (ref 26.0–34.0)
MCHC: 33.6 g/dL (ref 30.0–36.0)
MCV: 103.7 fL — ABNORMAL HIGH (ref 80.0–100.0)
MONOS PCT: 8 %
Monocytes Absolute: 0.9 10*3/uL (ref 0.1–1.0)
NEUTROS PCT: 78 %
Neutro Abs: 9.1 10*3/uL — ABNORMAL HIGH (ref 1.7–7.7)
Platelets: 133 10*3/uL — ABNORMAL LOW (ref 150–400)
RBC: 2.96 MIL/uL — ABNORMAL LOW (ref 4.22–5.81)
RDW: 14.5 % (ref 11.5–15.5)
WBC: 11.7 10*3/uL — ABNORMAL HIGH (ref 4.0–10.5)
nRBC: 0 % (ref 0.0–0.2)

## 2018-03-17 LAB — GLUCOSE, CAPILLARY
GLUCOSE-CAPILLARY: 143 mg/dL — AB (ref 70–99)
Glucose-Capillary: 216 mg/dL — ABNORMAL HIGH (ref 70–99)
Glucose-Capillary: 165 mg/dL — ABNORMAL HIGH (ref 70–99)
Glucose-Capillary: 179 mg/dL — ABNORMAL HIGH (ref 70–99)
Glucose-Capillary: 250 mg/dL — ABNORMAL HIGH (ref 70–99)

## 2018-03-17 LAB — PROCALCITONIN: Procalcitonin: 0.13 ng/mL

## 2018-03-17 LAB — MRSA PCR SCREENING: MRSA by PCR: NEGATIVE

## 2018-03-17 MED ORDER — VANCOMYCIN VARIABLE DOSE PER UNSTABLE RENAL FUNCTION (PHARMACIST DOSING): Status: DC

## 2018-03-17 MED ORDER — SODIUM CHLORIDE 0.9 % IV SOLN
2.0000 g | Freq: Once | INTRAVENOUS | Status: AC
  Administered 2018-03-17: 2 g via INTRAVENOUS
  Filled 2018-03-17: qty 2

## 2018-03-17 MED ORDER — VANCOMYCIN HCL 10 G IV SOLR
2000.0000 mg | Freq: Once | INTRAVENOUS | Status: AC
  Administered 2018-03-17: 2000 mg via INTRAVENOUS
  Filled 2018-03-17: qty 2000

## 2018-03-17 MED ORDER — GUAIFENESIN ER 600 MG PO TB12
600.0000 mg | ORAL_TABLET | Freq: Two times a day (BID) | ORAL | Status: DC
  Administered 2018-03-17 – 2018-03-19 (×5): 600 mg via ORAL
  Filled 2018-03-17 (×5): qty 1

## 2018-03-17 MED ORDER — SODIUM CHLORIDE 0.9 % IV SOLN
1.0000 g | INTRAVENOUS | Status: DC
  Filled 2018-03-17: qty 1

## 2018-03-17 NOTE — Progress Notes (Signed)
Progress Note  Patient Name: Nathaniel Bolton Date of Encounter: 03/17/2018  Primary Cardiologist: Yousof Alderman Martinique, MD, also followed in CHF clinic.  Electrophysiology: Thompson Grayer MD  Subjective   Patient complains of scratchy throat, productive cough, some dyspnea. Cough started yesterday. Wife reports fever last night but not recorded in chart. Feels poorly. Has not urinated since yesterday.  Inpatient Medications    Scheduled Meds: . allopurinol  100 mg Oral Daily  . amiodarone  200 mg Oral BID  . apixaban  5 mg Oral BID  . atorvastatin  80 mg Oral q1800  . carvedilol  6.25 mg Oral BID WC  . cholecalciferol  2,000 Units Oral BID  . gabapentin  300 mg Oral TID  . insulin aspart  0-15 Units Subcutaneous TID WC  . insulin glargine  10 Units Subcutaneous QHS  . pantoprazole  40 mg Oral Daily  . potassium chloride  10 mEq Oral BID  . sertraline  50 mg Oral Daily  . sodium chloride flush  3 mL Intravenous Q12H  . tamsulosin  0.4 mg Oral Daily  . vitamin C  1,000 mg Oral Daily  . vitamin E  400 Units Oral Daily   Continuous Infusions: . sodium chloride     PRN Meds: sodium chloride, acetaminophen, guaiFENesin-dextromethorphan, ondansetron (ZOFRAN) IV, phenol, sodium chloride flush   Vital Signs    Vitals:   03/16/18 1933 03/17/18 0528 03/17/18 0609 03/17/18 0926  BP: 131/66 (!) 98/58 (!) 102/56 (!) 110/54  Pulse: 78 (!) 59  (!) 59  Resp: 18 20    Temp: 99.9 F (37.7 C) 98.9 F (37.2 C)  (!) 97.4 F (36.3 C)  TempSrc: Oral   Oral  SpO2: 100% 97%  93%  Weight:  88.2 kg    Height:        Intake/Output Summary (Last 24 hours) at 03/17/2018 0952 Last data filed at 03/17/2018 0749 Gross per 24 hour  Intake 720 ml  Output 1700 ml  Net -980 ml   Last 3 Weights 03/17/2018 03/16/2018 03/14/2018  Weight (lbs) 194 lb 6.4 oz 193 lb 1.6 oz 192 lb 4.8 oz  Weight (kg) 88.179 kg 87.59 kg 87.227 kg      Telemetry    NSR, No VT - Personally Reviewed  ECG    None today. -  Personally Reviewed  Physical Exam   GEN: elderly WM, appears ill.  Neck:  JVD 5 cm Cardiac: IRRR, no murmurs, rubs, or gallops.  Respiratory: diffuse coarse rhonchi and congestion R>L GI: Soft, nontender, non-distended  MS: No edema; No deformity. Neuro:  Nonfocal  Psych: Normal affect   Labs    Chemistry Recent Labs  Lab 03/15/18 0101 03/15/18 0601 03/16/18 0711 03/17/18 0424  NA 138 139 134* 134*  K 3.7 4.6 4.4 4.0  CL 105 102 100 98  CO2 25 27 25 25   GLUCOSE 161* 139* 152* 192*  BUN 30* 29* 30* 36*  CREATININE 1.74* 1.84* 1.83* 2.47*  CALCIUM 8.7* 9.1 8.7* 8.6*  PROT 6.6  --   --   --   ALBUMIN 3.4*  --   --   --   AST 28  --   --   --   ALT 24  --   --   --   ALKPHOS 90  --   --   --   BILITOT 0.8  --   --   --   GFRNONAA 37* 35* 35* 24*  GFRAA 43* 40*  41* 28*  ANIONGAP 8 10 9 11      Hematology Recent Labs  Lab 03/15/18 0101  WBC 8.0  RBC 3.49*  HGB 11.9*  HCT 36.1*  MCV 103.4*  MCH 34.1*  MCHC 33.0  RDW 14.0  PLT 169    Cardiac Enzymes Recent Labs  Lab 03/15/18 0101 03/15/18 0601 03/15/18 1112  TROPONINI 0.74* 0.72* 0.51*   No results for input(s): TROPIPOC in the last 168 hours.   BNP Recent Labs  Lab 03/15/18 0101  BNP 394.0*     DDimer No results for input(s): DDIMER in the last 168 hours.   Radiology    No results found.  Cardiac Studies   Echo: 03/15/18: IMPRESSIONS    1. The left ventricle has a visually estimated ejection fraction of of 20%. The cavity size was mildly dilated. There is moderately increased left ventricular wall thickness. Left ventricular diastolic Doppler parameters are indeterminate Left  ventricular diffuse hypokinesis.  2. Left atrial size was severely dilated.  3. Right atrial size was mildly dilated.  4. The mitral valve is degenerative. Mild thickening of the mitral valve leaflet. Mitral valve regurgitation is mild to moderate by color flow Doppler.  5. The tricuspid valve is normal in  structure.  6. The aortic valve is tricuspid.  7. Severe akinesis of the left ventricular inferior wall.  8. When compared to the prior study: 11/2017: LVEF 20-25%, apical thrombus noted.  SUMMARY   LVEF 20-25%, mildly dilated LV, severe global hypokinesis with inferior akinesis and thinning suggestive of scar, RV not well visualized, pacer or AICD wire noted, severe LAE, mild RAE, trivial TR, RVSP 16 mmHg, normal IVC, sluggish flow at the apex -possible filling defect (Cannot r/o thrombus -previously noted).  FINDINGS  Left Ventricle: The left ventricle has a visually estimated ejection fraction of of 20%. The cavity size was mildly dilated. There is moderately increased left ventricular wall thickness. Left ventricular diastolic Doppler parameters are indeterminate  Left ventricular diffuse hypokinesis. Severe akinesis of the left ventricular inferior wall. Definity contrast agent was given IV to delineate the left ventricular endocardial borders. Right Ventricle: The right ventricle was not well visualized. The cavity was not assessed. There is right vetricular wall thickness was not assessed. Pacing wire/catheter visualized in the right ventricle. Left Atrium: left atrial size was severely dilated Right Atrium: right atrial size was mildly dilated. Right atrial pressure is estimated at 3 mmHg. Interatrial Septum: No atrial level shunt detected by color flow Doppler. Pericardium: There is no evidence of pericardial effusion. Mitral Valve: The mitral valve is degenerative in appearance. Mild thickening of the mitral valve leaflet. Mitral valve regurgitation is mild to moderate by color flow Doppler. Tricuspid Valve: The tricuspid valve is normal in structure. Tricuspid valve regurgitation is trivial by color flow Doppler. Aortic Valve: The aortic valve is tricuspid Aortic valve regurgitation was not visualized by color flow Doppler. There is no evidence of aortic valve stenosis. Pulmonic  Valve: The pulmonic valve was grossly normal. Pulmonic valve regurgitation is not visualized by color flow Doppler. Venous: The inferior vena cava is normal in size with greater than 50% respiratory variability. Compared to previous exam: 11/2017: LVEF 20-25%, apical thrombus noted.     Patient Profile     78 y.o. male with CKD stage III, DM II, HTN, HLD, h/o VT s/p ICD,chronic systolic CHF with EF 90% s/p barostim device placement, severe CAD, COPD, PAF on Eliquis, and h/o CVAwho presented [facility] due  to [facility] shocks.  Assessment & Plan    1.  Ventriclar tachycardia No further episodes since increasing his amiodarone and coreg Would continue current regimen at discharge Plan follow up with Dr. Rayann Heman 4 weeks post discharge.  Continue amiodarone 200mg  BID until then  2. CAD s/p CABG He has known CAD with prior CABG.  He had "indigestion" prior to his VT which in retrospect he thinks was angina. He is currently not a candidate for invasive evaluation especially with rising creatinine. Ideally it would be nice to know if there has been a change in his CAD that could be triggering his VT but I think the risk from a renal standpoint is too high now.   3. LV thrombus Chronic. On Eliquis. Will stop ASA given increased bleeding risk.   4. Acute cough, increased respiration ? Fever. Very congested on exam. I am concerned about possible HAP. Will check procalcitonin. Repeat CBC, CXR. Start antibiotics with cefipime/Vanc for now.   5. afib No afib since ICD implant On eliquis given prior stroke  6. Acute on CRI. Baseline creatinine 1.71 up to 2.47 today. No urine output recorded since yesterday. Has been on usual dose of lasix 80 mg bid. Aldactone dose increased one week ago. No recent IV lasix or metolazone. Will check bladder scan to make sure he is not obstructed. Will hold lasix, aldactone and losartan today. Repeat BMET daily.    7. HTN Stable No change required  today  8. OSA Compliant with CPAP  9. DM Resume home medicine  10. Deconditioning PT notes appreciated.   For questions or updates, please contact [place] Please consult [email or web address] for contact info under        Signed, Marlisha Vanwyk Martinique, MD  03/17/2018, 9:52 AM

## 2018-03-17 NOTE — Progress Notes (Signed)
Bladder Scan >560ml Pt walked to the bathroom attempted to urinate with no success.

## 2018-03-17 NOTE — Progress Notes (Signed)
IN and Out cath complete pt tolerated well 778ml

## 2018-03-17 NOTE — Consult Note (Signed)
   [facility] CM Inpatient Consult   03/17/2018  Nathaniel Bolton 16-Jun-1940 [id number]  Patient is currently active with [facility] for chronic disease management services with [facility].  Patient has been engaged by a [place].  Our community based plan of care has focused on disease management and community resource support and the [facility] Phamacist for medication cost and medication management needs.  Patient was admitted for HF exacerbation.  Patient's History and Physical from MD note 03/15/2018 as follows: Nathaniel Bolton is a 78 year old gentleman with CKD stage III, DM II, HTN, HLD, h/o VT s/p ICD,chronic systolic CHF with EF 69% s/p barostim device placement, severe CAD, COPD, PAF on Eliquis, and h/o CVAwho presented to the [facility] ED due to ICD shocks. Met with the patient and his wife, Nathaniel Bolton. Regarding ongoing needs with [facility] Management team.  Patient and wife states the issue was resolved with his Lantus with the [facility].  Patient and wife appreciative of [facility] services and endorses ongoing follow up. Patient endorses Laverna Peace, NP as his primary care provider and he also goes to the [facility] in [place] as well.  He uses [facility] and the [facility] for his pharmacies. Wife states she has the [facility] Calendar for documenting his BP, CBGs, and was encouraged to write down his oxygen levels as they also have a pulse oximetry at home.  Patient will receive a post hospital call and will be evaluated for assessments and disease process education.  Inpatient Case Manager aware that [facility] following in morning progression meeting. Of note, [facility] Care Management services does not replace or interfere with any services that are needed or arranged by inpatient case management or social work.  For additional questions or referrals please contact:  Natividad Brood, RN BSN [facility] Liaison  [phone] business mobile  phone Toll free office [phone]

## 2018-03-17 NOTE — Progress Notes (Signed)
Pharmacy Antibiotic Note  Nathaniel Bolton is a 78 y.o. male with possible HCAP Pharmacy has been consulted for vancomycin and cefepime dosing. -SCr= 2.27 (trend up; baseline ~ 1.8), CrCl ~ 30  Plan: -Cefepime 2gm IV x1 followed by 1gm IV q24h -Vancomycin 2000mg  IV x1  -No vancomycin maintenance dosing now -BMET in am to follow trend  Height: 5\' 10"  (177.8 cm) Weight: 194 lb 6.4 oz (88.2 kg)(scale c) IBW/kg (Calculated) : 73  Temp (24hrs), Avg:98.7 F (37.1 C), Min:97.4 F (36.3 C), Max:99.9 F (37.7 C)  Recent Labs  Lab 03/10/18 1035 03/15/18 0101 03/15/18 0601 03/16/18 0711 03/17/18 0424  WBC  --  8.0  --   --   --   CREATININE 2.12* 1.74* 1.84* 1.83* 2.47*    Estimated Creatinine Clearance: 28.5 mL/min (A) (by C-G formula based on SCr of 2.47 mg/dL (H)).    Allergies  Allergen Reactions  . Adhesive [Tape] Itching and Rash    Testosterone patch adhesive Please use paper tape    Antimicrobials this admission: 3/9 vanc>. 3/9 cefepime  Dose adjustments this admission:   Microbiology results: 3/9 MRSA PCR  Thank you for allowing pharmacy to be a part of this patient's care.  Hildred Laser, PharmD Clinical Pharmacist **Pharmacist phone directory can now be found on [email or web address] ([email or web address]).  Listed under [employer].

## 2018-03-17 NOTE — Progress Notes (Signed)
Inpatient Diabetes Program Recommendations  AACE/ADA: New Consensus Statement on Inpatient Glycemic Control (2015)  Target Ranges:  Prepandial:   less than 140 mg/dL      Peak postprandial:   less than 180 mg/dL (1-2 hours)      Critically ill patients:  140 - 180 mg/dL   Lab Results  Component Value Date   GLUCAP 165 (H) 03/17/2018   HGBA1C 8.7 (H) 11/14/2017    Review of Glycemic Control Results for Nathaniel Bolton, Nathaniel Bolton (MRN [id number]) as of 03/17/2018 11:42  Ref. Range 03/16/2018 05:59 03/16/2018 11:23 03/16/2018 17:03 03/16/2018 21:35 03/17/2018 06:12  Glucose-Capillary Latest Ref Range: 70 - 99 mg/dL 129 (H) 219 (H) 133 (H) 268 (H) 165 (H)   Diabetes history: DM Outpatient Diabetes medications: Lantus 10 units + Novolog meal coverage (2 units if CBG 110-150 and 3 units if CBG >300) Current orders for Inpatient glycemic control: Lantus 10 units + Novolog moderate correction tid  Inpatient Diabetes Program Recommendations:   A1c to determine glycemic control Novolog 2 units tid meal coverage if eats 50% Decrease Novolog correction to sensitive   Thank you, Bethena Roys E. Patriciann Becht, RN, MSN, CDE  Diabetes Coordinator Inpatient Glycemic Control Team Team Pager [phone] (8am-5pm) 03/17/2018 11:46 AM

## 2018-03-17 NOTE — Care Management Note (Signed)
Case Management Note  Patient Details  Name: [name] MRN: [id number] Date of Birth: February 23, 1940  Subjective/Objective:  VT            Action/Plan: Patient lives at home with spouse; Primary Physician: [name], NP; has private insurance with [facility] Medicare with prescription drug coverage; CM following for progression of care.  Expected Discharge Date:       Possibly 03/20/2018           Expected Discharge Plan:  Home/Self Care  Discharge planning Services  CM Consult  Status of Service:  In process, will continue to follow  Sherrilyn Rist [phone] 03/17/2018, 3:56 PM

## 2018-03-18 ENCOUNTER — Other Ambulatory Visit: Payer: Self-pay | Admitting: *Deleted

## 2018-03-18 LAB — BASIC METABOLIC PANEL
Anion gap: 10 (ref 5–15)
BUN: 43 mg/dL — ABNORMAL HIGH (ref 8–23)
CO2: 26 mmol/L (ref 22–32)
Calcium: 8.7 mg/dL — ABNORMAL LOW (ref 8.9–10.3)
Chloride: 97 mmol/L — ABNORMAL LOW (ref 98–111)
Creatinine, Ser: 2.17 mg/dL — ABNORMAL HIGH (ref 0.61–1.24)
GFR calc Af Amer: 33 mL/min — ABNORMAL LOW (ref >60)
GFR calc non Af Amer: 29 mL/min — ABNORMAL LOW (ref >60)
Glucose, Bld: 193 mg/dL — ABNORMAL HIGH (ref 70–99)
Potassium: 4.3 mmol/L (ref 3.5–5.1)
Sodium: 133 mmol/L — ABNORMAL LOW (ref 135–145)

## 2018-03-18 LAB — GLUCOSE, CAPILLARY
GLUCOSE-CAPILLARY: 175 mg/dL — AB (ref 70–99)
GLUCOSE-CAPILLARY: 257 mg/dL — AB (ref 70–99)
Glucose-Capillary: 213 mg/dL — ABNORMAL HIGH (ref 70–99)
Glucose-Capillary: 139 mg/dL — ABNORMAL HIGH (ref 70–99)

## 2018-03-18 MED ORDER — DOXYCYCLINE HYCLATE 100 MG PO TABS
100.0000 mg | ORAL_TABLET | Freq: Two times a day (BID) | ORAL | Status: DC
  Administered 2018-03-18 – 2018-03-19 (×3): 100 mg via ORAL
  Filled 2018-03-18 (×3): qty 1

## 2018-03-18 MED ORDER — LOSARTAN POTASSIUM 50 MG PO TABS
50.0000 mg | ORAL_TABLET | Freq: Every day | ORAL | Status: DC
  Administered 2018-03-18 – 2018-03-19 (×2): 50 mg via ORAL
  Filled 2018-03-18 (×2): qty 1

## 2018-03-18 MED ORDER — SPIRONOLACTONE 25 MG PO TABS
25.0000 mg | ORAL_TABLET | Freq: Every day | ORAL | Status: DC
  Administered 2018-03-18 – 2018-03-19 (×2): 25 mg via ORAL
  Filled 2018-03-18 (×2): qty 1

## 2018-03-18 NOTE — Patient Outreach (Signed)
[facility]) Care Management  03/18/2018  JACADEN FORBUSH [birth year] [id number]   RN Health Coach Initial Assessment    Referral Date:02/06/2018 Referral Source:Transfer from [place] Reason for Referral:Continued Disease Management Education Insurance:[employer] Medicare   Outreach Attempt:  Received notification patient admitted to [facility] for Syncope related to ICD firing and heart failure exacerbation.  [place] has seen and spoken with patient.  Plan:  RN Health Coach will await Hospital Liaison's recommendations for discharge needs.  [place] [phone] [email or web address]

## 2018-03-18 NOTE — Progress Notes (Signed)
Physical Therapy Treatment Patient Details Name: Nathaniel Bolton MRN: [id number] DOB: 1940-06-22 Today's Date: 03/18/2018    History of Present Illness Patient is a 78 y/o male presenting following multiple ICD shocks. Admitted for ventricular tachycardia and further work-up. PMH significant of CKD stage III, DM II, HTN, HLD, h/o VT s/p ICD, chronic systolic CHF with EF 24% s/p barostim device placement, severe CAD, COPD, PAF on Eliquis, and h/o CVA.    PT Comments    Pt progressing well with mobility. Ambulatory with rollator at supervision-level. Increased time spent discussing DME recommendations/safety, fall risk reduction and energy conservation strategies with pt and wife. Encouraged pt to continue ambulating with supervision from wife during hospital admission. Will continue to follow acutely.    Follow Up Recommendations  Outpatient PT;Supervision - Intermittent     Equipment Recommendations  None recommended by PT    Recommendations for Other Services       Precautions / Restrictions Precautions Precautions: Fall Restrictions Weight Bearing Restrictions: No    Mobility  Bed Mobility Overal bed mobility: Independent                Transfers Overall transfer level: Needs assistance Equipment used: 4-wheeled walker Transfers: Sit to/from Stand Sit to Stand: Supervision         General transfer comment: Cues to lock rollator brakes prior to standing and for correct hand placement  Ambulation/Gait Ambulation/Gait assistance: Supervision Gait Distance (Feet): 220 Feet Assistive device: 4-wheeled walker Gait Pattern/deviations: Step-through pattern;Decreased stride length;Trunk flexed Gait velocity: Decreased Gait velocity interpretation: 1.31 - 2.62 ft/sec, indicative of limited community ambulator General Gait Details: Slow, steady gait with rollator and supervision for safety. Pt taking intermittent standing rest breaks to talk, noted sway with static  standing; cues to lock rollator brakes for prolonged standing. Educ on having rollator propped against wall before going to sit if possible   Stairs             Wheelchair Mobility    Modified Rankin (Stroke Patients Only)       Balance Overall balance assessment: Needs assistance Sitting-balance support: No upper extremity supported;Feet supported Sitting balance-Leahy Scale: Good     Standing balance support: Bilateral upper extremity supported;During functional activity;No upper extremity supported Standing balance-Leahy Scale: Fair Standing balance comment: Can take steps without UE support, but reaching to furniture for stability. Stability improved with BUE support                            Cognition Arousal/Alertness: Awake/alert Behavior During Therapy: WFL for tasks assessed/performed Overall Cognitive Status: Within Functional Limits for tasks assessed                                        Exercises      General Comments General comments (skin integrity, edema, etc.): Wife present. Increased time discussing fall risk reduction, DME recs and energy conservation strategies (handout provided) with pt and wife      Pertinent Vitals/Pain Pain Assessment: No/denies pain    Home Living                      Prior Function            PT Goals (current goals can now be found in the care plan section) Acute Rehab PT Goals Patient Stated Goal:  regain strength and balance PT Goal Formulation: With patient Time For Goal Achievement: 03/30/18 Potential to Achieve Goals: Good Progress towards PT goals: Progressing toward goals    Frequency    Min 3X/week      PT Plan Current plan remains appropriate    Co-evaluation              AM-PAC PT "6 Clicks" Mobility   Outcome Measure  Help needed turning from your back to your side while in a flat bed without using bedrails?: None Help needed moving from lying  on your back to sitting on the side of a flat bed without using bedrails?: None Help needed moving to and from a bed to a chair (including a wheelchair)?: None Help needed standing up from a chair using your arms (e.g., wheelchair or bedside chair)?: A Little Help needed to walk in hospital room?: A Little Help needed climbing 3-5 steps with a railing? : A Little 6 Click Score: 21    End of Session Equipment Utilized During Treatment: Gait belt Activity Tolerance: Patient tolerated treatment well Patient left: in bed;with call bell/phone within reach;with family/visitor present;with nursing/sitter in room Nurse Communication: Mobility status PT Visit Diagnosis: Unsteadiness on feet (R26.81);Other abnormalities of gait and mobility (R26.89);Muscle weakness (generalized) (M62.81)     Time: 1275-1700 PT Time Calculation (min) (ACUTE ONLY): 22 min  Charges:  $Gait Training: 8-22 mins                    Mabeline Caras, PT, DPT Acute Rehabilitation Services  Pager [phone] Office [place] 03/18/2018, 12:33 PM

## 2018-03-18 NOTE — Progress Notes (Signed)
Progress Note  Patient Name: Nathaniel Bolton Date of Encounter: 03/18/2018  Primary Cardiologist:  Martinique, MD, also followed in CHF clinic.  Electrophysiology: Thompson Grayer MD  Subjective   Patient still has productive cough. Dyspnea improved. No fever. No chest pain. Urine output much better since I/O cath.   Inpatient Medications    Scheduled Meds: . allopurinol  100 mg Oral Daily  . amiodarone  200 mg Oral BID  . apixaban  5 mg Oral BID  . atorvastatin  80 mg Oral q1800  . carvedilol  6.25 mg Oral BID WC  . cholecalciferol  2,000 Units Oral BID  . doxycycline  100 mg Oral Q12H  . gabapentin  300 mg Oral TID  . guaiFENesin  600 mg Oral BID  . insulin aspart  0-15 Units Subcutaneous TID WC  . insulin glargine  10 Units Subcutaneous QHS  . losartan  50 mg Oral Daily  . pantoprazole  40 mg Oral Daily  . potassium chloride  10 mEq Oral BID  . sertraline  50 mg Oral Daily  . sodium chloride flush  3 mL Intravenous Q12H  . spironolactone  25 mg Oral Daily  . tamsulosin  0.4 mg Oral Daily  . vancomycin variable dose per unstable renal function (pharmacist dosing)   Does not apply See admin instructions  . vitamin C  1,000 mg Oral Daily  . vitamin E  400 Units Oral Daily   Continuous Infusions: . sodium chloride     PRN Meds: sodium chloride, acetaminophen, guaiFENesin-dextromethorphan, ondansetron (ZOFRAN) IV, phenol, sodium chloride flush   Vital Signs    Vitals:   03/17/18 0926 03/17/18 1234 03/17/18 1946 03/18/18 0418  BP: (!) 110/54 (!) 101/58 (!) 124/56 (!) 122/55  Pulse: (!) 59 (!) 59 68 72  Resp:  16 18 18   Temp: (!) 97.4 F (36.3 C) 98.4 F (36.9 C) 98.8 F (37.1 C) 98.4 F (36.9 C)  TempSrc: Oral Oral Oral Oral  SpO2: 93% 93% 95% 95%  Weight:    89.2 kg  Height:        Intake/Output Summary (Last 24 hours) at 03/18/2018 0810 Last data filed at 03/18/2018 7209 Gross per 24 hour  Intake 603 ml  Output 2550 ml  Net -1947 ml   Last 3 Weights  03/18/2018 03/17/2018 03/16/2018  Weight (lbs) 196 lb 9.6 oz 194 lb 6.4 oz 193 lb 1.6 oz  Weight (kg) 89.177 kg 88.179 kg 87.59 kg      Telemetry    NSR, No VT, some bigeminy- Personally Reviewed  ECG    None today. - Personally Reviewed  Physical Exam   GEN: elderly WM, NAD Neck:  JVD 5 cm Cardiac: RRR, no murmurs, rubs, or gallops.  Respiratory:  coarse rhonchi but improved from yesterday. GI: Soft, nontender, non-distended  MS: No edema; No deformity. Neuro:  Nonfocal  Psych: Normal affect   Labs    Chemistry Recent Labs  Lab 03/15/18 0101  03/16/18 0711 03/17/18 0424 03/18/18 0504  NA 138   < > 134* 134* 133*  K 3.7   < > 4.4 4.0 4.3  CL 105   < > 100 98 97*  CO2 25   < > 25 25 26   GLUCOSE 161*   < > 152* 192* 193*  BUN 30*   < > 30* 36* 43*  CREATININE 1.74*   < > 1.83* 2.47* 2.17*  CALCIUM 8.7*   < > 8.7* 8.6* 8.7*  PROT 6.6  --   --   --   --  ALBUMIN 3.4*  --   --   --   --   AST 28  --   --   --   --   ALT 24  --   --   --   --   ALKPHOS 90  --   --   --   --   BILITOT 0.8  --   --   --   --   GFRNONAA 37*   < > 35* 24* 29*  GFRAA 43*   < > 41* 28* 33*  ANIONGAP 8   < > 9 11 10    < > = values in this interval not displayed.     Hematology Recent Labs  Lab 03/15/18 0101 03/17/18 0959  WBC 8.0 11.7*  RBC 3.49* 2.96*  HGB 11.9* 10.3*  HCT 36.1* 30.7*  MCV 103.4* 103.7*  MCH 34.1* 34.8*  MCHC 33.0 33.6  RDW 14.0 14.5  PLT 169 133*    Cardiac Enzymes Recent Labs  Lab 03/15/18 0101 03/15/18 0601 03/15/18 1112  TROPONINI 0.74* 0.72* 0.51*   No results for input(s): TROPIPOC in the last 168 hours.   BNP Recent Labs  Lab 03/15/18 0101  BNP 394.0*     DDimer No results for input(s): DDIMER in the last 168 hours.   Radiology    Dg Chest 2 View  Result Date: 03/17/2018 CLINICAL DATA:  Cough for 2 days. EXAM: CHEST - 2 VIEW COMPARISON:  March 15, 2018 FINDINGS: Stable AICD device. Stable generator over the right side of the chest. Mild  bibasilar opacities. No other acute abnormalities or changes. IMPRESSION: Stable bibasilar opacities, right greater than left, could represent atelectasis or infiltrate. Atelectasis is favored. Electronically Signed   By: Dorise Bullion III M.D   On: 03/17/2018 18:36    Cardiac Studies   Echo: 03/15/18: IMPRESSIONS    1. The left ventricle has a visually estimated ejection fraction of of 20%. The cavity size was mildly dilated. There is moderately increased left ventricular wall thickness. Left ventricular diastolic Doppler parameters are indeterminate Left  ventricular diffuse hypokinesis.  2. Left atrial size was severely dilated.  3. Right atrial size was mildly dilated.  4. The mitral valve is degenerative. Mild thickening of the mitral valve leaflet. Mitral valve regurgitation is mild to moderate by color flow Doppler.  5. The tricuspid valve is normal in structure.  6. The aortic valve is tricuspid.  7. Severe akinesis of the left ventricular inferior wall.  8. When compared to the prior study: 11/2017: LVEF 20-25%, apical thrombus noted.  SUMMARY   LVEF 20-25%, mildly dilated LV, severe global hypokinesis with inferior akinesis and thinning suggestive of scar, RV not well visualized, pacer or AICD wire noted, severe LAE, mild RAE, trivial TR, RVSP 16 mmHg, normal IVC, sluggish flow at the apex -possible filling defect (Cannot r/o thrombus -previously noted).  FINDINGS  Left Ventricle: The left ventricle has a visually estimated ejection fraction of of 20%. The cavity size was mildly dilated. There is moderately increased left ventricular wall thickness. Left ventricular diastolic Doppler parameters are indeterminate  Left ventricular diffuse hypokinesis. Severe akinesis of the left ventricular inferior wall. Definity contrast agent was given IV to delineate the left ventricular endocardial borders. Right Ventricle: The right ventricle was not well visualized. The cavity was not  assessed. There is right vetricular wall thickness was not assessed. Pacing wire/catheter visualized in the right ventricle. Left Atrium: left atrial size was severely dilated Right Atrium: right  atrial size was mildly dilated. Right atrial pressure is estimated at 3 mmHg. Interatrial Septum: No atrial level shunt detected by color flow Doppler. Pericardium: There is no evidence of pericardial effusion. Mitral Valve: The mitral valve is degenerative in appearance. Mild thickening of the mitral valve leaflet. Mitral valve regurgitation is mild to moderate by color flow Doppler. Tricuspid Valve: The tricuspid valve is normal in structure. Tricuspid valve regurgitation is trivial by color flow Doppler. Aortic Valve: The aortic valve is tricuspid Aortic valve regurgitation was not visualized by color flow Doppler. There is no evidence of aortic valve stenosis. Pulmonic Valve: The pulmonic valve was grossly normal. Pulmonic valve regurgitation is not visualized by color flow Doppler. Venous: The inferior vena cava is normal in size with greater than 50% respiratory variability. Compared to previous exam: 11/2017: LVEF 20-25%, apical thrombus noted.     Patient Profile     78 y.o. male with CKD stage III, DM II, HTN, HLD, h/o VT s/p ICD,chronic systolic CHF with EF 14% s/p barostim device placement, severe CAD, COPD, PAF on Eliquis, and h/o CVAwho presented to the [facility] ED due to ICD shocks.  Assessment & Plan    1.  Ventriclar tachycardia No further episodes since increasing his amiodarone and coreg Would continue current regimen at discharge Plan follow up with Dr. Rayann Heman 4 weeks post discharge.  Continue amiodarone 200mg  BID until then  2. CAD s/p CABG He has known CAD with prior CABG.  He had "indigestion" prior to his VT which in retrospect he thinks was angina. He is currently not a candidate for invasive evaluation especially with rising creatinine. Ideally it would be nice to know  if there has been a change in his CAD that could be triggering his VT but I think the risk from a renal standpoint is too high now. Will monitor for recurrent angina.  3. LV thrombus Chronic. On Eliquis. Will stop ASA given increased bleeding risk.   4. Acute bronchitis. No fever. procalcitonin is normal. CXR shows atelectasis. Will stop Cefipime and Vanc. Start oral doxycycline. Continue guafenisin.   5. afib No afib since ICD implant On eliquis given prior stroke  6. Acute on CRI. Baseline creatinine 1.71 up to 2.47 yesterday. This was related to bladder outlet obstruction. Since I/O cath x 1 he has been able to urinate well and has a post obstruction diuresis. Renal function improved today. I/O negative 1700 cc yesterday. Will resume losartan and aldactone. Continue to hold lasix today. Recheck renal function in am.    7. HTN Stable No change required today  8. OSA Compliant with CPAP  9. DM Resume home medicine  10. Deconditioning PT notes appreciated.   I anticipate DC tomorrow if condition continues to improve.   For questions or updates, please contact [place] Please consult [email or web address] for contact info under        Signed,  [place], MD  03/18/2018, 8:10 AM

## 2018-03-19 ENCOUNTER — Other Ambulatory Visit: Payer: Self-pay | Admitting: *Deleted

## 2018-03-19 ENCOUNTER — Other Ambulatory Visit (HOSPITAL_COMMUNITY): Payer: Self-pay | Admitting: Internal Medicine

## 2018-03-19 ENCOUNTER — Other Ambulatory Visit: Payer: Self-pay | Admitting: Cardiology

## 2018-03-19 DIAGNOSIS — I10 Essential (primary) hypertension: Secondary | ICD-10-CM

## 2018-03-19 LAB — BASIC METABOLIC PANEL
Anion gap: 8 (ref 5–15)
BUN: 47 mg/dL — ABNORMAL HIGH (ref 8–23)
CO2: 25 mmol/L (ref 22–32)
Calcium: 8.7 mg/dL — ABNORMAL LOW (ref 8.9–10.3)
Chloride: 102 mmol/L (ref 98–111)
Creatinine, Ser: 2.16 mg/dL — ABNORMAL HIGH (ref 0.61–1.24)
GFR calc Af Amer: 33 mL/min — ABNORMAL LOW (ref >60)
GFR calc non Af Amer: 29 mL/min — ABNORMAL LOW (ref >60)
Glucose, Bld: 161 mg/dL — ABNORMAL HIGH (ref 70–99)
Potassium: 4.3 mmol/L (ref 3.5–5.1)
Sodium: 135 mmol/L (ref 135–145)

## 2018-03-19 LAB — GLUCOSE, CAPILLARY
GLUCOSE-CAPILLARY: 146 mg/dL — AB (ref 70–99)
Glucose-Capillary: 207 mg/dL — ABNORMAL HIGH (ref 70–99)

## 2018-03-19 MED ORDER — FUROSEMIDE 80 MG PO TABS: 80.0000 mg | ORAL_TABLET | Freq: Two times a day (BID) | ORAL | Status: DC

## 2018-03-19 MED ORDER — POTASSIUM CHLORIDE CRYS ER 10 MEQ PO TBCR: 10.0000 meq | EXTENDED_RELEASE_TABLET | Freq: Two times a day (BID) | ORAL | 1 refills | Status: DC

## 2018-03-19 MED ORDER — AMIODARONE HCL 200 MG PO TABS: 200.0000 mg | ORAL_TABLET | Freq: Two times a day (BID) | ORAL | 2 refills | Status: DC

## 2018-03-19 MED ORDER — DOXYCYCLINE HYCLATE 100 MG PO TABS: 100.0000 mg | ORAL_TABLET | Freq: Two times a day (BID) | ORAL | 0 refills | Status: AC

## 2018-03-19 MED ORDER — TAMSULOSIN HCL 0.4 MG PO CAPS: 0.4000 mg | ORAL_CAPSULE | Freq: Every day | ORAL | 0 refills | Status: AC

## 2018-03-19 MED ORDER — ATORVASTATIN CALCIUM 80 MG PO TABS: 80.0000 mg | ORAL_TABLET | Freq: Every day | ORAL | 3 refills | Status: AC

## 2018-03-19 MED ORDER — GUAIFENESIN ER 600 MG PO TB12: 600.0000 mg | ORAL_TABLET | Freq: Two times a day (BID) | ORAL | 0 refills | Status: AC

## 2018-03-19 MED ORDER — SPIRONOLACTONE 25 MG PO TABS: 25.0000 mg | ORAL_TABLET | Freq: Every day | ORAL | 1 refills | Status: AC

## 2018-03-19 MED ORDER — CARVEDILOL 6.25 MG PO TABS: 6.2500 mg | ORAL_TABLET | Freq: Two times a day (BID) | ORAL | 3 refills | Status: AC

## 2018-03-19 NOTE — Discharge Instructions (Signed)

## 2018-03-19 NOTE — Patient Outreach (Signed)
[facility]) Care Management  03/19/2018  Nathaniel Bolton 08/06/1940 [id number]   RN Health Coach Discipline Closure    Referral Date:02/06/2018 Referral Source:Transfer from [place] Reason for Referral:Continued Disease Management Education Insurance:[employer] Medicare   Outreach Attempt:  Plan for discharge home today.  [facility] Liaison will place referral for [place] to follow.  Plan: RN Health Coach will close Disease Management Case. RN Health Coach will send primary care provider Discipline Closure Letter.  [place] [phone] [email or web address]

## 2018-03-19 NOTE — Progress Notes (Signed)
Progress Note  Patient Name: Nathaniel Bolton Date of Encounter: 03/19/2018  Primary Cardiologist: Peter Martinique, MD, also followed in CHF clinic.  Electrophysiology: Thompson Grayer MD  Subjective   Patient states cough is better. Now only dry cough. Dyspnea improved. No fever. No chest pain. Urine output reduced but states he was able to void a large amount this am.   Inpatient Medications    Scheduled Meds: . allopurinol  100 mg Oral Daily  . amiodarone  200 mg Oral BID  . apixaban  5 mg Oral BID  . atorvastatin  80 mg Oral q1800  . carvedilol  6.25 mg Oral BID WC  . cholecalciferol  2,000 Units Oral BID  . doxycycline  100 mg Oral Q12H  . furosemide  80 mg Oral BID  . gabapentin  300 mg Oral TID  . guaiFENesin  600 mg Oral BID  . insulin aspart  0-15 Units Subcutaneous TID WC  . insulin glargine  10 Units Subcutaneous QHS  . losartan  50 mg Oral Daily  . pantoprazole  40 mg Oral Daily  . potassium chloride  10 mEq Oral BID  . sertraline  50 mg Oral Daily  . sodium chloride flush  3 mL Intravenous Q12H  . spironolactone  25 mg Oral Daily  . tamsulosin  0.4 mg Oral Daily  . vitamin C  1,000 mg Oral Daily  . vitamin E  400 Units Oral Daily   Continuous Infusions: . sodium chloride     PRN Meds: sodium chloride, acetaminophen, guaiFENesin-dextromethorphan, ondansetron (ZOFRAN) IV, phenol, sodium chloride flush   Vital Signs    Vitals:   03/18/18 1744 03/18/18 2100 03/19/18 0400 03/19/18 0434  BP: (!) 125/47 (!) 124/56  (!) 122/55  Pulse: 61 62  61  Resp:  19  18  Temp:  98.1 F (36.7 C)  98.2 F (36.8 C)  TempSrc:  Oral  Oral  SpO2:  99%  98%  Weight:   89.4 kg   Height:        Intake/Output Summary (Last 24 hours) at 03/19/2018 1048 Last data filed at 03/18/2018 1800 Gross per 24 hour  Intake 440 ml  Output -  Net 440 ml   Last 3 Weights 03/19/2018 03/18/2018 03/17/2018  Weight (lbs) 197 lb 3.2 oz 196 lb 9.6 oz 194 lb 6.4 oz  Weight (kg) 89.449 kg 89.177  kg 88.179 kg      Telemetry    NSR, No VT, some PVCs bigeminy- Personally Reviewed  ECG    None today. - Personally Reviewed  Physical Exam   GEN: elderly WM, NAD Neck:  JVD 5 cm Cardiac: RRR, no murmurs, rubs, or gallops.  Respiratory:  few rhonchi GI: Soft, nontender, non-distended  MS: No edema; No deformity. Neuro:  Nonfocal  Psych: Normal affect   Labs    Chemistry Recent Labs  Lab 03/15/18 0101  03/17/18 0424 03/18/18 0504 03/19/18 0456  NA 138   < > 134* 133* 135  K 3.7   < > 4.0 4.3 4.3  CL 105   < > 98 97* 102  CO2 25   < > 25 26 25   GLUCOSE 161*   < > 192* 193* 161*  BUN 30*   < > 36* 43* 47*  CREATININE 1.74*   < > 2.47* 2.17* 2.16*  CALCIUM 8.7*   < > 8.6* 8.7* 8.7*  PROT 6.6  --   --   --   --  ALBUMIN 3.4*  --   --   --   --   AST 28  --   --   --   --   ALT 24  --   --   --   --   ALKPHOS 90  --   --   --   --   BILITOT 0.8  --   --   --   --   GFRNONAA 37*   < > 24* 29* 29*  GFRAA 43*   < > 28* 33* 33*  ANIONGAP 8   < > 11 10 8    < > = values in this interval not displayed.     Hematology Recent Labs  Lab 03/15/18 0101 03/17/18 0959  WBC 8.0 11.7*  RBC 3.49* 2.96*  HGB 11.9* 10.3*  HCT 36.1* 30.7*  MCV 103.4* 103.7*  MCH 34.1* 34.8*  MCHC 33.0 33.6  RDW 14.0 14.5  PLT 169 133*    Cardiac Enzymes Recent Labs  Lab 03/15/18 0101 03/15/18 0601 03/15/18 1112  TROPONINI 0.74* 0.72* 0.51*   No results for input(s): TROPIPOC in the last 168 hours.   BNP Recent Labs  Lab 03/15/18 0101  BNP 394.0*     DDimer No results for input(s): DDIMER in the last 168 hours.   Radiology    Dg Chest 2 View  Result Date: 03/17/2018 CLINICAL DATA:  Cough for 2 days. EXAM: CHEST - 2 VIEW COMPARISON:  March 15, 2018 FINDINGS: Stable AICD device. Stable generator over the right side of the chest. Mild bibasilar opacities. No other acute abnormalities or changes. IMPRESSION: Stable bibasilar opacities, right greater than left, could represent  atelectasis or infiltrate. Atelectasis is favored. Electronically Signed   By: Dorise Bullion III M.D   On: 03/17/2018 18:36    Cardiac Studies   Echo: 03/15/18: IMPRESSIONS    1. The left ventricle has a visually estimated ejection fraction of of 20%. The cavity size was mildly dilated. There is moderately increased left ventricular wall thickness. Left ventricular diastolic Doppler parameters are indeterminate Left  ventricular diffuse hypokinesis.  2. Left atrial size was severely dilated.  3. Right atrial size was mildly dilated.  4. The mitral valve is degenerative. Mild thickening of the mitral valve leaflet. Mitral valve regurgitation is mild to moderate by color flow Doppler.  5. The tricuspid valve is normal in structure.  6. The aortic valve is tricuspid.  7. Severe akinesis of the left ventricular inferior wall.  8. When compared to the prior study: 11/2017: LVEF 20-25%, apical thrombus noted.  SUMMARY   LVEF 20-25%, mildly dilated LV, severe global hypokinesis with inferior akinesis and thinning suggestive of scar, RV not well visualized, pacer or AICD wire noted, severe LAE, mild RAE, trivial TR, RVSP 16 mmHg, normal IVC, sluggish flow at the apex -possible filling defect (Cannot r/o thrombus -previously noted).  FINDINGS  Left Ventricle: The left ventricle has a visually estimated ejection fraction of of 20%. The cavity size was mildly dilated. There is moderately increased left ventricular wall thickness. Left ventricular diastolic Doppler parameters are indeterminate  Left ventricular diffuse hypokinesis. Severe akinesis of the left ventricular inferior wall. Definity contrast agent was given IV to delineate the left ventricular endocardial borders. Right Ventricle: The right ventricle was not well visualized. The cavity was not assessed. There is right vetricular wall thickness was not assessed. Pacing wire/catheter visualized in the right ventricle. Left Atrium: left  atrial size was severely dilated Right Atrium: right  atrial size was mildly dilated. Right atrial pressure is estimated at 3 mmHg. Interatrial Septum: No atrial level shunt detected by color flow Doppler. Pericardium: There is no evidence of pericardial effusion. Mitral Valve: The mitral valve is degenerative in appearance. Mild thickening of the mitral valve leaflet. Mitral valve regurgitation is mild to moderate by color flow Doppler. Tricuspid Valve: The tricuspid valve is normal in structure. Tricuspid valve regurgitation is trivial by color flow Doppler. Aortic Valve: The aortic valve is tricuspid Aortic valve regurgitation was not visualized by color flow Doppler. There is no evidence of aortic valve stenosis. Pulmonic Valve: The pulmonic valve was grossly normal. Pulmonic valve regurgitation is not visualized by color flow Doppler. Venous: The inferior vena cava is normal in size with greater than 50% respiratory variability. Compared to previous exam: 11/2017: LVEF 20-25%, apical thrombus noted.     Patient Profile     78 y.o. male with CKD stage III, DM II, HTN, HLD, h/o VT s/p ICD,chronic systolic CHF with EF 17% s/p barostim device placement, severe CAD, COPD, PAF on Eliquis, and h/o CVAwho presented to the [facility] ED due to ICD shocks.  Assessment & Plan    1.  Ventriclar tachycardia No further episodes since increasing his amiodarone and coreg Would continue current regimen at discharge Plan follow up with Dr. Rayann Heman 4 weeks post discharge.  Continue amiodarone 200mg  BID until then  2. CAD s/p CABG He has known CAD with prior CABG.  He had "indigestion" prior to his VT which in retrospect he thinks was angina. He is currently not a candidate for invasive evaluation especially with rising creatinine. Ideally it would be nice to know if there has been a change in his CAD that could be triggering his VT but I think the risk from a renal standpoint is too high now. Will  monitor for recurrent angina.  3. LV thrombus Chronic. On Eliquis. Will stop ASA given increased bleeding risk.   4. Acute bronchitis. No fever. procalcitonin is normal. CXR shows atelectasis. on oral doxycycline continue for 5 more days. Continue guafenisin.   5. afib No afib since ICD implant On eliquis given prior stroke  6. Acute on CRI. Baseline creatinine 1.71 up to 2.47 This was related to bladder outlet obstruction. Since I/O cath x 1 he has been able to urinate well and has a post obstruction diuresis. Renal function improved and has stabilized at 2.16. will resume lasix 80 mg bid today. He is on Flomax. Needs a new prescription. If he continues to have difficulty voiding will need to see Urology.   7. HTN Stable No change required today  8. OSA Compliant with CPAP  9. DM Resume home medicine  10. Deconditioning PT notes appreciated.   Plan DC home today. Follow up with Dr. Rayann Heman in 4 weeks. Should follow up in my office in 2-3 weeks and needs a BMET. Has follow up in CHF clinic in June.   For questions or updates, please contact [place] Please consult [email or web address] for contact info under        Signed, Peter Martinique, MD  03/19/2018, 10:48 AM

## 2018-03-19 NOTE — Discharge Summary (Signed)
Discharge Summary    Patient ID: Nathaniel Bolton MRN: [id number]; DOB: 20-Jun-1940  Admit date: 03/14/2018 Discharge date: 03/19/2018  Primary Care Provider: [name], NP  Primary Cardiologist: Peter Martinique, MD  Primary Electrophysiologist:  Thompson Grayer, MD  Advanced Heart Failure Team  Discharge Diagnoses    Principal Problem:   Ventricular tachycardia (paroxysmal) [facility]) Active Problems:   CAD (coronary artery disease)   Hypertension   Diabetes mellitus ([name])   Automatic implantable cardioverter-defibrillator in situ   Acute on chronic systolic HF (heart failure) ([name])   Ischemic cardiomyopathy   Atrial fibrillation ([name])   CAD in native artery   Hyperlipidemia   Ventricular tachyarrhythmia ([name])   Allergies Allergies  Allergen Reactions  . Adhesive [Tape] Itching and Rash    Testosterone patch adhesive Please use paper tape    Diagnostic Studies/Procedures    Echo: 03/15/18: IMPRESSIONS  1. The left ventricle has a visually estimated ejection fraction of of 20%. The cavity size was mildly dilated. There is moderately increased left ventricular wall thickness. Left ventricular diastolic Doppler parameters are indeterminate Left  ventricular diffuse hypokinesis. 2. Left atrial size was severely dilated. 3. Right atrial size was mildly dilated. 4. The mitral valve is degenerative. Mild thickening of the mitral valve leaflet. Mitral valve regurgitation is mild to moderate by color flow Doppler. 5. The tricuspid valve is normal in structure. 6. The aortic valve is tricuspid. 7. Severe akinesis of the left ventricular inferior wall. 8. When compared to the prior study: 11/2017: LVEF 20-25%, apical thrombus noted.  SUMMARY  LVEF 20-25%, mildly dilated LV, severe global hypokinesis with inferior akinesis and thinning suggestive of scar, RV not well visualized, pacer or AICD wire noted, severe LAE, mild RAE, trivial TR, RVSP 16 mmHg, normal IVC, sluggish  flow at the apex -possible filling defect (Cannot r/o thrombus -previously noted). FINDINGS Left Ventricle: The left ventricle has a visually estimated ejection fraction of of 20%. The cavity size was mildly dilated. There is moderately increased left ventricular wall thickness. Left ventricular diastolic Doppler parameters are indeterminate  Left ventricular diffuse hypokinesis. Severe akinesis of the left ventricular inferior wall. Definity contrast agent was given IV to delineate the left ventricular endocardial borders. Right Ventricle: The right ventricle was not well visualized. The cavity was not assessed. There is right vetricular wall thickness was not assessed. Pacing wire/catheter visualized in the right ventricle. Left Atrium: left atrial size was severely dilated Right Atrium: right atrial size was mildly dilated. Right atrial pressure is estimated at 3 mmHg. Interatrial Septum: No atrial level shunt detected by color flow Doppler. Pericardium: There is no evidence of pericardial effusion. Mitral Valve: The mitral valve is degenerative in appearance. Mild thickening of the mitral valve leaflet. Mitral valve regurgitation is mild to moderate by color flow Doppler. Tricuspid Valve: The tricuspid valve is normal in structure. Tricuspid valve regurgitation is trivial by color flow Doppler. Aortic Valve: The aortic valve is tricuspid Aortic valve regurgitation was not visualized by color flow Doppler. There is no evidence of aortic valve stenosis. Pulmonic Valve: The pulmonic valve was grossly normal. Pulmonic valve regurgitation is not visualized by color flow Doppler. Venous: The inferior vena cava is normal in size with greater than 50% respiratory variability. Compared to previous exam: 11/2017: LVEF 20-25%, apical thrombus noted.   _____________   History of Present Illness     Nathaniel Bolton is a 78 year old gentleman with CKD stage III, DM II, HTN, HLD, h/o VT s/p ICD,chronic  systolic CHF with EF 85% s/p barostim device placement, severe CAD, COPD, PAF on Eliquis, and h/o CVAwho presented to [facility] due to ICD shocks.  The patient reported that in the days leading up to admission he had symptoms of indigestion but no chest pain. On the evening of Thursday 3/5, he went to dinner with his wife and during dinner suddenly felt weak and tremulous. He quickly lost consciousness and wife reports ICD shock. EMS was called immediately but patient again lost consciousness and was shocked while being loaded onto the stretcher and then a final time while en route to [facility]. He underwent work-up which showed stable CKD and electrolytes, Troponin elevation to 1.35 --> 1.57 --> 1.41. He was started on an amiodarone infusion which was subsequently transitioned to 200mg  BID amiodarone in the setting of bradycardia with infusion. TTE was performed which showed EF 25%, RVSP 40 mmHg and mild TR. He was then transferred to [facility] for further evaluation.  Upon arrival, the patient reported feeling well. No further ICD shocks since Thursday evening. Denied chest pain, shortness of breath, orthopnea, PND, LEE, weight gain.  Hospital Course     Consultants: EP, Dr. Rayann Heman  1.Ventriclar tachycardia No further episodes since increasing his amiodarone and coreg Would continue current regimen at discharge Plan follow up with Dr. Rayann Heman 4 weeks post discharge. Continue amiodarone 200mg  BID until then  2. CAD s/p CABG He has known CAD with prior CABG. He had "indigestion" prior to his VT which in retrospect he thinks was angina. He is currently not a candidate for invasive evaluation especially with rising creatinine. Ideally it would be nice to know if there has been a change in his CAD that could be triggering his VT but I think the risk from a renal standpoint is too high now. Will monitor for recurrent angina.  3. LV thrombus Chronic. On Eliquis. Will stop ASA given  increased bleeding risk.   4. Acute bronchitis. No fever. procalcitonin is normal. CXR shows atelectasis. on oral doxycycline continue for 5 more days. Continue guafenisin.   5. afib No afib since ICD implant On eliquis given prior stroke  6. Acute on CRI. Baseline creatinine 1.71 up to 2.47 This was related to bladder outlet obstruction. Since I/O cath x 1 he has been able to urinate well and has a post obstruction diuresis. Renal function improved and has stabilized at 2.16. will resume lasix 80 mg bid today. He is on Flomax. Needs a new prescription. If he continues to have difficulty voiding will need to see Urology.   7. HTN Stable No change required today  8. OSA Compliant with CPAP  9. DM Resume home medicine  10. Deconditioning PT notes appreciated.   Pt seen by Dr. Martinique today. Plan DC home today. Follow up with Dr. Rayann Heman in 4 weeks. Should follow up in my office in 2-3 weeks and needs a BMET. Has follow up in CHF clinic in June.   _____________  Discharge Vitals Blood pressure (!) 115/49, pulse (!) 57, temperature 98.2 F (36.8 C), temperature source Oral, resp. rate 18, height 5\' 10"  (1.778 m), weight 89.4 kg, SpO2 98 %.  Filed Weights   03/17/18 0528 03/18/18 0418 03/19/18 0400  Weight: 88.2 kg 89.2 kg 89.4 kg    Labs & Radiologic Studies    CBC Recent Labs    03/17/18 0959  WBC 11.7*  NEUTROABS 9.1*  HGB 10.3*  HCT 30.7*  MCV 103.7*  PLT 133*  Basic Metabolic Panel Recent Labs    03/18/18 0504 03/19/18 0456  NA 133* 135  K 4.3 4.3  CL 97* 102  CO2 26 25  GLUCOSE 193* 161*  BUN 43* 47*  CREATININE 2.17* 2.16*  CALCIUM 8.7* 8.7*   Liver Function Tests No results for input(s): AST, ALT, ALKPHOS, BILITOT, PROT, ALBUMIN in the last 72 hours. No results for input(s): LIPASE, AMYLASE in the last 72 hours. Cardiac Enzymes No results for input(s): CKTOTAL, CKMB, CKMBINDEX, TROPONINI in the last 72 hours. BNP Invalid input(s):  POCBNP D-Dimer No results for input(s): DDIMER in the last 72 hours. Hemoglobin A1C No results for input(s): HGBA1C in the last 72 hours. Fasting Lipid Panel No results for input(s): CHOL, HDL, LDLCALC, TRIG, CHOLHDL, LDLDIRECT in the last 72 hours. Thyroid Function Tests No results for input(s): TSH, T4TOTAL, T3FREE, THYROIDAB in the last 72 hours.  Invalid input(s): FREET3 _____________  Dg Chest 2 View  Result Date: 03/17/2018 CLINICAL DATA:  Cough for 2 days. EXAM: CHEST - 2 VIEW COMPARISON:  March 15, 2018 FINDINGS: Stable AICD device. Stable generator over the right side of the chest. Mild bibasilar opacities. No other acute abnormalities or changes. IMPRESSION: Stable bibasilar opacities, right greater than left, could represent atelectasis or infiltrate. Atelectasis is favored. Electronically Signed   By: Dorise Bullion III M.D   On: 03/17/2018 18:36   Dg Chest Port 1 View  Result Date: 03/15/2018 CLINICAL DATA:  Heart failure. EXAM: PORTABLE CHEST 1 VIEW COMPARISON:  Radiographs 03/13/2018 FINDINGS: Post median sternotomy and CABG. Stable or decreased cardiomegaly from recent exam. Left-sided pacemaker in place. Minimal septal thickening suggesting pulmonary edema, new from prior. Bibasilar atelectasis or scarring. No large pleural effusion or pneumothorax. Additional battery pack with lead coursing into the neck is partially included. IMPRESSION: Mild septal thickening suggesting pulmonary edema. Stable or decreased cardiomegaly from radiographs 2 days ago. Electronically Signed   By: Keith Rake M.D.   On: 03/15/2018 00:37   Disposition   Pt is being discharged home today in good condition.  Follow-up Plans & Appointments    Follow-up Information    Thompson Grayer, MD Follow up.   Specialty:  Cardiology Why:  The office will call you to schedule an appointment for 4 weeks.  Contact information: [place] [address] [phone]         Martinique, Peter M, MD Follow up.   Specialty:  Cardiology Why:  Cardiology hospital follow up with Almyra Deforest, Dr. Doug Sou PA on 04/11/2018 at 9:00. Please arrive 15 minutes early for check in.  Contact information: [address] [place] [address] [phone]        [name], Shaune Pascal, MD Follow up.   Specialty:  Cardiology Why:  Heart Failure hospital follow up on 06/11/2018 at 10:00. Please arrive 15 minutes early for check in. Contact information: [address] [place] [address] [phone]          Discharge Instructions    Diet - low sodium heart healthy   Complete by:  As directed    Increase activity slowly   Complete by:  As directed       Discharge Medications   Allergies as of 03/19/2018      Reactions   Adhesive [tape] Itching, Rash   Testosterone patch adhesive Please use paper tape      Medication List    STOP taking these medications   meloxicam 15  MG tablet Commonly known as:  MOBIC   metolazone 2.5 MG tablet Commonly known as:  ZAROXOLYN   potassium chloride 10 MEQ tablet Commonly known as:  K-DUR Replaced by:  potassium chloride 10 MEQ tablet     TAKE these medications   allopurinol 100 MG tablet Commonly known as:  ZYLOPRIM Take 100 mg by mouth daily.   amiodarone 200 MG tablet Commonly known as:  PACERONE Take 1 tablet (200 mg total) by mouth 2 (two) times daily for 30 days. What changed:  when to take this   apixaban 5 MG Tabs tablet Commonly known as:  ELIQUIS Take 1 tablet (5 mg total) by mouth 2 (two) times daily.   atorvastatin 80 MG tablet Commonly known as:  LIPITOR Take 1 tablet (80 mg total) by mouth daily at 6 PM. What changed:    medication strength  how much to take  when to take this   carvedilol 6.25 MG tablet Commonly known as:  COREG Take 1 tablet (6.25 mg total) by mouth 2 (two) times daily with a meal. What changed:    medication strength  how much to take    cyclobenzaprine 10 MG tablet Commonly known as:  FLEXERIL Take 10 mg by mouth 2 (two) times daily.   doxycycline 100 MG tablet Commonly known as:  VIBRA-TABS Take 1 tablet (100 mg total) by mouth every 12 (twelve) hours for 5 days.   FISH OIL PO Take 2 tablets by mouth 2 (two) times daily.   furosemide 80 MG tablet Commonly known as:  LASIX Take 1 tablet (80 mg total) by mouth 2 (two) times daily.   gabapentin 300 MG capsule Commonly known as:  NEURONTIN Take 300 mg by mouth 3 (three) times daily.   guaiFENesin 600 MG 12 hr tablet Commonly known as:  MUCINEX Take 1 tablet (600 mg total) by mouth 2 (two) times daily for 14 days.   insulin aspart 100 UNIT/ML FlexPen Commonly known as:  NovoLOG FlexPen inject 3 units if sugar is >151 ac meal; inject 2 units if sugar is btween 110 & 150, Do not inject any insulin  if your sugar is < 110 What changed:    how much to take  how to take this  when to take this  additional instructions   insulin glargine 100 unit/mL Sopn Commonly known as:  LANTUS Inject 0.1 mLs (10 Units total) into the skin daily. What changed:  when to take this   losartan 50 MG tablet Commonly known as:  COZAAR Take 1 tablet (50 mg total) by mouth daily.   Nitrostat 0.4 MG SL tablet Generic drug:  nitroGLYCERIN Place 0.4 mg under the tongue every 5 (five) minutes as needed for chest pain (MAX 3 TABLETS).   NON FORMULARY Needles, syringes with needles, glucose reagent test strips   omeprazole 20 MG capsule Commonly known as:  PRILOSEC Take 20 mg by mouth daily.   potassium chloride 10 MEQ tablet Commonly known as:  K-DUR,KLOR-CON Take 1 tablet (10 mEq total) by mouth 2 (two) times daily. Replaces:  potassium chloride 10 MEQ tablet   PROBIOTIC ACIDOPHILUS PO Take 1 capsule by mouth 2 (two) times daily.   SAW PALMETTO PO Take 1 tablet by mouth daily.   sertraline 100 MG tablet Commonly known as:  ZOLOFT Take 50 mg by mouth daily.    spironolactone 25 MG tablet Commonly known as:  ALDACTONE Take 1 tablet (25 mg total) by mouth daily. Start taking on:  March 20, 2018   tamsulosin 0.4 MG Caps capsule Commonly known as:  FLOMAX Take 1 capsule (0.4 mg total) by mouth daily for 30 days.   Ventolin HFA 108 (90 Base) MCG/ACT inhaler Generic drug:  albuterol Inhale 2 puffs into the lungs every 4 (four) hours as needed for wheezing or shortness of breath.   vitamin C 1000 MG tablet Take 1,000 mg by mouth daily.   Vitamin D3 50 MCG (2000 UT) Tabs Take 2,000 Units by mouth 2 (two) times daily.   vitamin E 400 UNIT capsule Take 400 Units by mouth daily.        Acute coronary syndrome (MI, NSTEMI, STEMI, etc) this admission?: No.    Outstanding Labs/Studies   BMet at follow up  Duration of Discharge Encounter   Greater than 30 minutes including physician time.  Signed, Daune Perch, NP 03/19/2018, 12:35 PM

## 2018-03-20 ENCOUNTER — Other Ambulatory Visit (HOSPITAL_COMMUNITY): Payer: Medicare HMO

## 2018-03-20 ENCOUNTER — Ambulatory Visit: Payer: Self-pay

## 2018-03-20 NOTE — Progress Notes (Signed)
Remote ICD transmission.   

## 2018-03-21 ENCOUNTER — Other Ambulatory Visit: Payer: Self-pay

## 2018-03-21 NOTE — Patient Outreach (Signed)
New referral/ transfer from health coach after recent hospital admission:  Placed call to patient and spoke with wife. Wife reports patient is doing well. Reports no shortness of breath or chest pain. Reports swelling in legs. Took an extra fluid pill today.  Wife states that patient weighed yesterday but did not record. Wife reports that patient has all medications and is taking medications as prescribed.  Reports follow up appointment with Laverna Peace ( NP) on 03/31/2018.  NOTE MD OFFICE DOES TRANSITION OF CARE.  PLAN: will call patient back in 1 week to follow up. No home visit allowed at this time due to COVID 19 Virus. Explained this to wife, who voiced understanding.   Tomasa Rand, RN, BSN, CEN [facility] [employer] [phone]

## 2018-03-24 ENCOUNTER — Ambulatory Visit (INDEPENDENT_AMBULATORY_CARE_PROVIDER_SITE_OTHER): Payer: Medicare HMO

## 2018-03-24 ENCOUNTER — Other Ambulatory Visit: Payer: Self-pay

## 2018-03-24 DIAGNOSIS — Z95810 Presence of automatic (implantable) cardiac defibrillator: Secondary | ICD-10-CM

## 2018-03-24 DIAGNOSIS — I5022 Chronic systolic (congestive) heart failure: Secondary | ICD-10-CM

## 2018-03-25 ENCOUNTER — Telehealth: Payer: Self-pay

## 2018-03-25 NOTE — Telephone Encounter (Signed)
Left message for patient to remind of missed remote transmission.  

## 2018-03-26 NOTE — Progress Notes (Signed)
EPIC Encounter for ICM Monitoring  Patient Name: Nathaniel Bolton is a 78 y.o. male Date: 03/26/2018 Primary Care Physican: [name], NP Primary Cardiologist:Jordan/[name] Electrophysiologist:Allred Last Weight:197 lbs at last office visit 03/10/2018 03/26/2018 Weight:188 lbs   Spoke with wife.  Patient hospitalized 3/7 - 3/11 for ICM shock. Patient has some general weakness and continues to have bronchitis symptoms.  Has PCP appointment tomorrow for follow up.  Report: Thoracic impedancenormal since 03/20/2018 following hospitalization.  Prescribed: Furosemide80 mgTake 1 tablet (80 mg total) by mouth 2 (two) times daily.Potassium 10 mEqTake 1 tablet (00 meq total) by mouth twice day.  Labs: 03/19/2018 Creatinine 2.16, BUN 47, Potassium 4.3, Sodium 135, GFR 29-33 03/18/2018 Creatinine 2.17, BUN 43, Potassium 4.3, Sodium 133, GFR 29-33  03/17/2018 Creatinine 2.47, BUN 36, Potassium 4.0, Sodium 134, GFR 24-28  03/16/2018 Creatinine 1.83, BUN 30, Potassium 4.4, Sodium 134, GFR 35-41  03/15/2018 Creatinine 1.84, BUN 29, Potassium 4.6, Sodium 139, GFR 35-40  03/10/2018 Creatinine 2.12, BUN 50, Potassium 4.1, Sodium 137, GFR 29-34 01/09/2018 Creatinine 1.92, BUN 43, Potassium 3.6, Sodium 139, GFR 33-38  12/16/2017 Creatinine 1.93, BUN 45, Potassium 4.4, Sodium 130, GFR 33-38  12/10/2017 Creatinine 2.37, BUN 72, Potassium 2.7, Sodium 128, GFR 26-30  A complete set of results can be found in Results Review.  Recommendations:No changes and encouraged to call for fluid symptoms.   Follow-up plan: ICM clinic phone appointment on4/06/2018. Office appt 04/11/2018 with Almyra Deforest, PA.  Office appt scheduled 04/21/2018 with Dr Rayann Heman.    Copy of ICM check sent to Dr. Rayann Heman.   3 month ICM trend: 03/25/2018    1 Year ICM trend:       [name], RN 03/26/2018 9:30 AM

## 2018-03-27 DIAGNOSIS — I509 Heart failure, unspecified: Secondary | ICD-10-CM | POA: Diagnosis not present

## 2018-03-27 DIAGNOSIS — Z79899 Other long term (current) drug therapy: Secondary | ICD-10-CM | POA: Diagnosis not present

## 2018-03-27 DIAGNOSIS — J42 Unspecified chronic bronchitis: Secondary | ICD-10-CM | POA: Diagnosis not present

## 2018-03-27 DIAGNOSIS — Z6828 Body mass index (BMI) 28.0-28.9, adult: Secondary | ICD-10-CM | POA: Diagnosis not present

## 2018-03-28 ENCOUNTER — Other Ambulatory Visit: Payer: Self-pay

## 2018-03-28 DIAGNOSIS — S51811A Laceration without foreign body of right forearm, initial encounter: Secondary | ICD-10-CM | POA: Diagnosis not present

## 2018-03-28 DIAGNOSIS — S41101A Unspecified open wound of right upper arm, initial encounter: Secondary | ICD-10-CM | POA: Diagnosis not present

## 2018-03-28 NOTE — Patient Outreach (Signed)
Telephone assessment:  Recent discharge from hospital due to defib firing.  Today's Vitals   03/28/18 1427  Weight: 190 lb (86.2 kg)  Height: 1.778 m (_0 )  PainSc: 0-No pain    Placed call to patient and spoke with patient and wife via phone. See assessments.  Wife reports patient saw primary MD yesterday. Reports appointment with pulmonary on 03/31/2018. ] Wife states patient is doing well with the exception of falls. Fell yesterday after going to MD Wife reports skin tear from wrist to elbow. Daughter assisted getting patient back to standing position. Patient was not using walker and lost his balance.    Wife reports patient is weighing daily.  Taking medications as prescribed.  Assessment: (1) Reviewed [facility] program. Patient has information and packet from last home visit. (2) CHF: weighs daily and takes medications as prescribed. (3) ? Asthma/ copd:  Saw MD yesterday in office and received a breathing treatment. (4) falls.  Frequent falls related to balance.  PLAN:  (1) consent noted in medical record. (2) reviewed heart failure zones via phone.  Encouraged wife to review heart failure zones provided in [facility] calendar.  Encouraged wife to continue to monitor daily weights and call MD for weight gain. (3) encouraged follow up appointment with MD on 03/31/2018. (4) reviewed fall precautions with wife. Encouraged patient to use walker at all times. Reviewed signs of infection and when to call MD about skin tears.  No home visit allowed due to COVID 19. No face to face visits.  Next outreach planned via phone in 2 weeks.   [facility]N CM Care Plan Problem One     Most Recent Value  Care Plan Problem One  Recent admission for hear failure  Role Documenting the Problem One  Care Management [place] for Problem One  Active  THN Long Term Goal   Patient and or wife will report no readmissions to the hospital for heart failure in the next 60 days.   THN Long Term Goal Start  Date  03/21/18  Interventions for Problem One Long Term Goal  Encouraged daily weights and follwoing low salt diet. Reviewed when to call MD.   [facility] CM Short Term Goal #1   Patient and or wife will report weighing and recording daily weights for the next 30 days.   THN CM Short Term Goal #1 Start Date  03/21/18  Interventions for Short Term Goal #1  reviewed current weights. Reviewed heart failure recommendations for weight gain.   [facility]N CM Short Term Goal #2   Paitent and or wife will report following up with MD as planned on 03/31/2018.  [facility]N CM Short Term Goal #2 Start Date  03/21/18  [facility] CM Short Term Goal #2 Met Date  03/28/18  Interventions for Short Term Goal #2  goal met    [facility] CM Care Plan Problem Two     Most Recent Value  Care Plan Problem Two  Fall risk  Role Documenting the Problem Two  Care Management Coordinator  Care Plan for Problem Two  Active  Interventions for Problem Two Long Term Goal   Reviewed fall risk. Encouraged patient to use walker at all times. Encourged wife to provide reminders.and prompts.   THN Long Term Goal  Patient and or wife will report no falls in the next 31 days.   THN Long Term Goal Start Date  03/28/18    This note and barrier letter sent to MD. Tomasa Rand, RN, BSN, CEN  [place] Coordinator [phone]

## 2018-03-31 ENCOUNTER — Other Ambulatory Visit: Payer: Self-pay

## 2018-03-31 DIAGNOSIS — G4733 Obstructive sleep apnea (adult) (pediatric): Secondary | ICD-10-CM | POA: Diagnosis not present

## 2018-03-31 DIAGNOSIS — R05 Cough: Secondary | ICD-10-CM | POA: Diagnosis not present

## 2018-03-31 DIAGNOSIS — J4540 Moderate persistent asthma, uncomplicated: Secondary | ICD-10-CM | POA: Diagnosis not present

## 2018-03-31 DIAGNOSIS — R5383 Other fatigue: Secondary | ICD-10-CM | POA: Diagnosis not present

## 2018-03-31 NOTE — Patient Outreach (Signed)
Telephone assessment:  Incoming call from Mrs. Menz who reports patient fell 3 days ago and injured arm again. Reports she took patient to theurgent care and his got " sterile stitches and glue". Reports she is observing for infection. States saw pulmonary today and was given a new inhaler to try and will follow up with pulmonary in 2 weeks.  PLAN: reviewed with wife about avoiding exposures if possible. Review fall precautions. Will follow up as previously planned.  Tomasa Rand, RN, BSN, CEN [facility] [employer] [phone]

## 2018-04-03 ENCOUNTER — Ambulatory Visit: Payer: Medicare HMO | Admitting: *Deleted

## 2018-04-03 DIAGNOSIS — G4733 Obstructive sleep apnea (adult) (pediatric): Secondary | ICD-10-CM | POA: Diagnosis not present

## 2018-04-09 DIAGNOSIS — E1129 Type 2 diabetes mellitus with other diabetic kidney complication: Secondary | ICD-10-CM | POA: Diagnosis not present

## 2018-04-10 ENCOUNTER — Other Ambulatory Visit: Payer: Self-pay

## 2018-04-10 ENCOUNTER — Telehealth: Payer: Self-pay

## 2018-04-10 NOTE — Telephone Encounter (Signed)
Virtual Visit Pre-Appointment Phone Call  Steps For Call:  1. Confirm consent - "In the setting of the current Covid19 crisis, you are scheduled for a (phone or video) visit with your provider on (date) at (time).  Just as we do with many in-office visits, in order for you to participate in this visit, we must obtain consent.  If you'd like, I can send this to your mychart (if signed up) or email for you to review.  Otherwise, I can obtain your verbal consent now.  All virtual visits are billed to your insurance company just like a normal visit would be.  By agreeing to a virtual visit, we'd like you to understand that the technology does not allow for your provider to perform an examination, and thus may limit your provider's ability to fully assess your condition.  Finally, though the technology is pretty good, we cannot assure that it will always work on either your or our end, and in the setting of a video visit, we may have to convert it to a phone-only visit.  In either situation, we cannot ensure that we have a secure connection.  Are you willing to proceed?"  2. Give patient instructions for WebEx download to smartphone as below if video visit  3. Advise patient to be prepared with any vital sign or heart rhythm information, their current medicines, and a piece of paper and pen handy for any instructions they may receive the day of their visit  4. Inform patient they will receive a phone call 15 minutes prior to their appointment time (may be from unknown caller ID) so they should be prepared to answer  5. Confirm that appointment type is correct in Epic appointment notes (video vs telephone)    TELEPHONE CALL NOTE  Nathaniel Bolton has been deemed a candidate for a follow-up tele-health visit to limit community exposure during the Covid-19 pandemic. I spoke with the patient via phone to ensure availability of phone/video source, confirm preferred email & phone number, and discuss  instructions and expectations.  I reminded Nathaniel Bolton to be prepared with any vital sign and/or heart rhythm information that could potentially be obtained via home monitoring, at the time of his visit. I reminded Nathaniel Bolton to expect a phone call at the time of his visit if his visit.  Did the patient verbally acknowledge consent to treatment? Yes  Jacqulynn Cadet, [facility] 04/10/2018 5:23 PM   DOWNLOADING THE [place], go to [employer] and type in WebEx in the search bar. [place] [employer], the blue/green circle. The app is free but as with any other app downloads, their phone may require them to verify saved payment information or Apple password. The patient does NOT have to create an account.  - If Android, ask patient to go to [employer] and type in WebEx in the search bar. [place] [employer], the blue/green circle. The app is free but as with any other app downloads, their phone may require them to verify saved payment information or Android password. The patient does NOT have to create an account.   CONSENT FOR TELE-HEALTH VISIT - PLEASE REVIEW  I hereby voluntarily request, consent and authorize [facility] and its employed or contracted physicians, physician assistants, nurse practitioners or other licensed health care professionals (the Practitioner), to provide me with telemedicine health care services (the "Services") as deemed necessary by the treating Practitioner. I  acknowledge and consent to receive the Services by the Practitioner via telemedicine. I understand that the telemedicine visit will involve communicating with the Practitioner through live audiovisual communication technology and the disclosure of certain medical information by electronic transmission. I acknowledge that I have been given the opportunity to request an in-person assessment or other available alternative prior to the telemedicine visit and  am voluntarily participating in the telemedicine visit.  I understand that I have the right to withhold or withdraw my consent to the use of telemedicine in the course of my care at any time, without affecting my right to future care or treatment, and that the Practitioner or I may terminate the telemedicine visit at any time. I understand that I have the right to inspect all information obtained and/or recorded in the course of the telemedicine visit and may receive copies of available information for a reasonable fee.  I understand that some of the potential risks of receiving the Services via telemedicine include:  Marland Kitchen Delay or interruption in medical evaluation due to technological equipment failure or disruption; . Information transmitted may not be sufficient (e.g. poor resolution of images) to allow for appropriate medical decision making by the Practitioner; and/or  . In rare instances, security protocols could fail, causing a breach of personal health information.  Furthermore, I acknowledge that it is my responsibility to provide information about my medical history, conditions and care that is complete and accurate to the best of my ability. I acknowledge that Practitioner's advice, recommendations, and/or decision may be based on factors not within their control, such as incomplete or inaccurate data provided by me or distortions of diagnostic images or specimens that may result from electronic transmissions. I understand that the practice of medicine is not an exact science and that Practitioner makes no warranties or guarantees regarding treatment outcomes. I acknowledge that I will receive a copy of this consent concurrently upon execution via email to the email address I last provided but may also request a printed copy by calling the office of [place].    I understand that my insurance will be billed for this visit.   I have read or had this consent read to me. . I understand the  contents of this consent, which adequately explains the benefits and risks of the Services being provided via telemedicine.  . I have been provided ample opportunity to ask questions regarding this consent and the Services and have had my questions answered to my satisfaction. . I give my informed consent for the services to be provided through the use of telemedicine in my medical care  By participating in this telemedicine visit I agree to the above.

## 2018-04-10 NOTE — Patient Outreach (Signed)
Telephone assessment:  Placed follow up call to patient and spoke with wife, who reports no new falls this week. Reports arms are healing nicely without any signs of infection, Reports no problems with breathing right now and no swelling. Repots patient is doing well.  PLAN: will plan follow up in 2 week or as needed with patient or wife calling. Reviewed importance of avoiding sick exposures.    Tomasa Rand, RN, BSN, CEN [facility] [employer] [phone]

## 2018-04-11 ENCOUNTER — Other Ambulatory Visit: Payer: Self-pay

## 2018-04-11 ENCOUNTER — Telehealth (INDEPENDENT_AMBULATORY_CARE_PROVIDER_SITE_OTHER): Payer: Medicare HMO | Admitting: Physician Assistant

## 2018-04-11 VITALS — BP 123/63 | Ht 70.0 in | Wt 188.0 lb

## 2018-04-11 DIAGNOSIS — I472 Ventricular tachycardia, unspecified: Secondary | ICD-10-CM

## 2018-04-11 DIAGNOSIS — N179 Acute kidney failure, unspecified: Secondary | ICD-10-CM | POA: Diagnosis not present

## 2018-04-11 DIAGNOSIS — I2510 Atherosclerotic heart disease of native coronary artery without angina pectoris: Secondary | ICD-10-CM

## 2018-04-11 DIAGNOSIS — I110 Hypertensive heart disease with heart failure: Secondary | ICD-10-CM

## 2018-04-11 DIAGNOSIS — I10 Essential (primary) hypertension: Secondary | ICD-10-CM

## 2018-04-11 DIAGNOSIS — I480 Paroxysmal atrial fibrillation: Secondary | ICD-10-CM

## 2018-04-11 DIAGNOSIS — N183 Chronic kidney disease, stage 3 (moderate): Secondary | ICD-10-CM | POA: Diagnosis not present

## 2018-04-11 DIAGNOSIS — E119 Type 2 diabetes mellitus without complications: Secondary | ICD-10-CM

## 2018-04-11 DIAGNOSIS — E785 Hyperlipidemia, unspecified: Secondary | ICD-10-CM

## 2018-04-11 DIAGNOSIS — I2583 Coronary atherosclerosis due to lipid rich plaque: Secondary | ICD-10-CM

## 2018-04-11 DIAGNOSIS — I255 Ischemic cardiomyopathy: Secondary | ICD-10-CM

## 2018-04-11 NOTE — Patient Instructions (Addendum)
Medication Instructions:  DECREASE LOSARTAN TO 25 Mg daily  If you need a refill on your cardiac medications before your next appointment, please call your pharmacy.   Lab work: You will need to have labs (blood Work) drawn in 1 week: BMET If you have labs (blood work) drawn today and your tests are completely normal, you will receive your results only by: Marland Kitchen MyChart Message (if you have MyChart) OR . A paper copy in the mail If you have any lab test that is abnormal or we need to change your treatment, we will call you to review the results.  Testing/Procedures:  None ordered at the time of this visit   Follow-Up: At [facility], you and your health needs are our priority.  As part of our continuing mission to provide you with exceptional heart care, we have created designated Provider Care Teams.  These Care Teams include your primary Cardiologist (physician) and Advanced Practice Providers (APPs -  Physician Assistants and Nurse Practitioners) who all work together to provide you with the care you need, when you need it. You will need a follow up appointment in 4-6 weeks you will see Peter Martinique, MD   Any Other Special Instructions Will Be Listed Below (If Applicable). Continue to monitor blood pressure. If the top number of the blood pressure drops less than 100 gain to DECREASE Spironolactone to 12.5 Mg daily call our office to let of know.

## 2018-04-11 NOTE — Progress Notes (Signed)
Virtual Visit via Telephone Note    Evaluation Performed:  Follow-up visit  This visit type was conducted due to national recommendations for restrictions regarding the COVID-19 Pandemic (e.g. social distancing).  This format is felt to be most appropriate for this patient at this time.  All issues noted in this document were discussed and addressed.  No physical exam was performed (except for noted visual exam findings with Video Visits).  Please refer to the patient's chart (MyChart message for video visits and phone note for telephone visits) for the patient's consent to telehealth for [facility].  Date:  04/11/2018   ID:  SIMS LADAY, DOB 06-14-1940, MRN [id number]  Patient Location:  Home  Provider location:   [place]  PCP:  [name], NP  Cardiologist:  Peter Martinique, MD Electrophysiologist:  Thompson Grayer, MD   Chief Complaint:  Hospital followup  History of Present Illness:    KALUM MINNER is a 78 y.o. male who presents via audio/video conferencing for a telehealth visit today.    ARIANNA DELSANTO is a 78 y.o. male with PMH of CKD stage III, CAD s/p CABG, DM II, HTN, HLD, h/o VT s/p ICD, chronic systolic heart failure, COPD, LV thrombus, PAF on eliquis and CVA.  He had an episode of sustained VT with heart rate 175 bpm in August 2016, this does not exceed his HR limit for the device, his device was later adjusted.  He was admitted in July 2017 with syncope and VT at [facility].  He was loaded with amiodarone.  A cardiac catheterization done at that time showed EF 20%, patent LIMA to LAD, patent SVG to RCA, occluded SVG to OM.  This was discussed with Dr.  Roxy Manns to see if patient need redo CABG, however he was found not to be a good candidate for redo surgery.  His cath film was reviewed by Dr. Martinique, left circumflex artery had a 90% ostial stenosis, mid left circumflex on the OM occluded, however no good target for PCI.  Echo at [facility] hospital in April 2018  showed EF 25-30% with inferolateral and anterior lateral akinesis and apical dyskinesis, mild to moderate MR with tethering of the posterior leaflet.  There has been some attempt at starting him on Entresto in the past, however he was unable to afford it.  His nephrologist stopped lisinopril in July 2017.  Losartan was later added.  He had a generator change out for the ICD in August 2018.  He was enrolled in Barostim trial which was placed in March 2018.  I last saw the patient on 04/02/2017, at which time he was doing well.  He was readmitted in November 2019 with 20 pound weight gain over 3 weeks.  He was treated with IV Lasix.  Echocardiogram revealed EF of 20 to 25% with LV thrombus.  Diuresis was limited by acute kidney injury but improved by discharge.  He was referred to heart failure service and was seen by Dr. Haroldine Laws on 11/29/2017.  He was on 80 mg twice daily of Lasix along with twice weekly dosing of metolazone.  Due to borderline bradycardia, his low-dose carvedilol could not be uptitrated.  Spironolactone was also added at the time.  He was deemed not a transplant or VAD candidate due to age.  He was last seen by heart failure service on 03/10/2018, at which time he appears to be volume overloaded.  Spironolactone was increased to 25 mg daily.  He was instructed  to take additional metolazone.  More recently, patient presented to [facility] ED on 03/14/2018 with ICD shock.  He was also ruled in for NSTEMI.  Device interrogation revealed multiple episodes of VT.  ATP were both unsuccessful in terminating tachycardia.  ICD shock was successful.  There were also episodes of VT missed classified as SVT by morphology discriminator.  Device was reprogrammed to better capture VT.  Carvedilol was increased to 6.25 mg twice daily.  He had no further episode of VT spell after increasing amiodarone and carvedilol.  On further discussion, he had episodes of indigestion prior to the VT which he thinks was angina.   His case was reviewed by Dr. Martinique and felt cardiac catheterization at this time pose too high of a risk from renal standpoint.  Repeat cardiac catheterization performed on 03/15/2018 continue to show LV dysfunction with EF of 20%, severe LAE, mild to moderate MR, severe akinesis of the left ventricular inferior wall, apical thrombus noted.  He had AKI with worsening renal function in the hospital related to bladder outlet obstruction.  Lasix, Aldactone and losartan were temporarily held.  The patient does not have symptoms concerning for COVID-19 infection (fever, chills, cough, or new shortness of breath).    Prior CV studies:   The following studies were reviewed today:  Echo 03/15/2018 IMPRESSIONS    1. The left ventricle has a visually estimated ejection fraction of of 20%. The cavity size was mildly dilated. There is moderately increased left ventricular wall thickness. Left ventricular diastolic Doppler parameters are indeterminate Left  ventricular diffuse hypokinesis.  2. Left atrial size was severely dilated.  3. Right atrial size was mildly dilated.  4. The mitral valve is degenerative. Mild thickening of the mitral valve leaflet. Mitral valve regurgitation is mild to moderate by color flow Doppler.  5. The tricuspid valve is normal in structure.  6. The aortic valve is tricuspid.  7. Severe akinesis of the left ventricular inferior wall.  8. When compared to the prior study: 11/2017: LVEF 20-25%, apical thrombus noted.  Past Medical History:  Diagnosis Date   CAD (coronary artery disease)    CKD stage 3 due to type 2 diabetes mellitus ([place])    Diabetes mellitus    Type 2   Erectile dysfunction    Gout    Hyperlipidemia    Hypertension    ICD (implantable cardiac defibrillator) in place    Myocardial infarction, old    x2 in [place]. S/P CABG 2003, Dr Roxy Manns   Obstructive lung disease ([place])    Moderate per prior PFT's   Paroxysmal atrial fibrillation  ([place]) 3/17   discovered on event monitor   Stroke [facility])    7322   Systolic CHF, acute on chronic ([place])    EF is 27% per myoview and echo October 2012   Ventricular tachycardia ([place]) 08/2014   175 bpm   Past Surgical History:  Procedure Laterality Date   BYPASS GRAFT     x5   CARDIAC DEFIBRILLATOR PLACEMENT  2008   Medronic by Dr Elonda Husky at [facility]   cataract surgery     CHOLECYSTECTOMY     COLONOSCOPY  12/23/2006   Small colonic polyps, status post polypectomy. Interanl hemorrhoids.    CORONARY ARTERY BYPASS GRAFT     EYE SURGERY     HERNIA REPAIR     ICD GENERATOR CHANGEOUT N/A 08/30/2016   MDT Visia AF VR ICD implanted by Dr Rayann Heman   KNEE  ARTHROSCOPY     right   UMBILICAL HERNIA REPAIR       Current Meds  Medication Sig   allopurinol (ZYLOPRIM) 100 MG tablet Take 100 mg by mouth daily.   amiodarone (PACERONE) 200 MG tablet Take 1 tablet (200 mg total) by mouth 2 (two) times daily for 30 days.   apixaban (ELIQUIS) 5 MG TABS tablet Take 1 tablet (5 mg total) by mouth 2 (two) times daily.   Ascorbic Acid (VITAMIN C) 1000 MG tablet Take 1,000 mg by mouth daily.   atorvastatin (LIPITOR) 80 MG tablet Take 1 tablet (80 mg total) by mouth daily at 6 PM.   carvedilol (COREG) 6.25 MG tablet Take 1 tablet (6.25 mg total) by mouth 2 (two) times daily with a meal.   Cholecalciferol (VITAMIN D3) 2000 units TABS Take 2,000 Units by mouth 2 (two) times daily.    cyclobenzaprine (FLEXERIL) 10 MG tablet Take 10 mg by mouth 2 (two) times daily.   furosemide (LASIX) 80 MG tablet Take 1 tablet (80 mg total) by mouth 2 (two) times daily.   gabapentin (NEURONTIN) 300 MG capsule Take 300 mg by mouth 3 (three) times daily.   insulin aspart (NOVOLOG FLEXPEN) 100 UNIT/ML FlexPen inject 3 units if sugar is >151 ac meal; inject 2 units if sugar is btween 110 & 150, Do not inject any insulin  if your sugar is < 110 (Patient taking differently: Inject 2-3 Units into the  skin 3 (three) times daily with meals. inject 3 units if sugar is greater 150  inject 2 units if sugar is btween 110 & 150,  Do not inject any insulin  if your sugar is < 110)   insulin glargine (LANTUS) 100 unit/mL SOPN Inject 0.1 mLs (10 Units total) into the skin daily. (Patient taking differently: Inject 10 Units into the skin at bedtime. )   Lactobacillus (PROBIOTIC ACIDOPHILUS PO) Take 1 capsule by mouth 2 (two) times daily.   losartan (COZAAR) 25 MG tablet Take 25 mg by mouth daily.   NITROSTAT 0.4 MG SL tablet Place 0.4 mg under the tongue every 5 (five) minutes as needed for chest pain (MAX 3 TABLETS).    NON FORMULARY Needles, syringes with needles, glucose reagent test strips   Omega-3 Fatty Acids (FISH OIL PO) Take 2 tablets by mouth 2 (two) times daily.    omeprazole (PRILOSEC) 20 MG capsule Take 20 mg by mouth daily.   potassium chloride (K-DUR,KLOR-CON) 10 MEQ tablet Take 1 tablet (10 mEq total) by mouth 2 (two) times daily.   Saw Palmetto, Serenoa repens, (SAW PALMETTO PO) Take 1 tablet by mouth daily.    sertraline (ZOLOFT) 100 MG tablet Take 50 mg by mouth daily.    spironolactone (ALDACTONE) 25 MG tablet Take 1 tablet (25 mg total) by mouth daily.   tamsulosin (FLOMAX) 0.4 MG CAPS capsule Take 1 capsule (0.4 mg total) by mouth daily for 30 days.   VENTOLIN HFA 108 (90 Base) MCG/ACT inhaler Inhale 2 puffs into the lungs every 4 (four) hours as needed for wheezing or shortness of breath.    vitamin E 400 UNIT capsule Take 400 Units by mouth daily.      Allergies:   Adhesive [tape]   Social History   Tobacco Use   Smoking status: Former Smoker    Last attempt to quit: 1991    Years since quitting: 29.2   Smokeless tobacco: Never Used  Substance Use Topics   Alcohol use: No  Drug use: No     Family Hx: The patient's family history includes Heart attack in his brother and father; Heart disease in his sister; Heart failure in his mother.  ROS:     Please see the history of present illness.     All other systems reviewed and are negative.   Labs/Other Tests and Data Reviewed:    Recent Labs: 03/15/2018: ALT 24; B Natriuretic Peptide 394.0; Magnesium 2.0; TSH 2.868 03/17/2018: Hemoglobin 10.3; Platelets 133 03/19/2018: BUN 47; Creatinine, Ser 2.16; Potassium 4.3; Sodium 135   Recent Lipid Panel No results found for: CHOL, TRIG, HDL, CHOLHDL, LDLCALC, LDLDIRECT  Wt Readings from Last 3 Encounters:  04/11/18 188 lb (85.3 kg)  04/10/18 189 lb (85.7 kg)  03/28/18 190 lb (86.2 kg)     Objective:    Vital Signs:  BP 123/63    Ht 5\' 10"  (1.778 m)    Wt 188 lb (85.3 kg)    BMI 26.98 kg/m   HR 65  Well nourished, well developed male in no acute distress.   ASSESSMENT & PLAN:    1.  CAD s/p CABG  -He was ruled in for NSTEMI during the recent hospitalization.  He describes indigestion feeling prior to arrival.  Since then, he has not had any further chest discomfort.  Not on aspirin given the need for Eliquis.  Patient is a high risk candidate for any invasive therapy due to poor renal function  2. Hypertension: Blood pressure was low in the past few days and occasionally dipped down to the 80s.  I instructed the patient to decrease his losartan to 25 mg daily.  If systolic blood pressure continue to dip below 100 mmHg, I likely will reduce spironolactone to 12.5 mg daily as well.   3. DM 2: Managed by primary care provider  4. Acute on CKD stage III: Recently, he had acute kidney injury during the hospitalization.  He will need a basic metabolic panel.  He follows with a nephrologist at [facility].  5. Hyperlipidemia  6. history of VT s/p ICD: Continue 200 mg twice daily of amiodarone along with increased dose of carvedilol.  Device interrogation on 4/6 before follow-up with Dr. Rayann Heman.  We will send a staff message to Dr. Rayann Heman to see if can convert his upcoming visit on 4/13 to ED visit  7. chronic systolic heart failure: His  weight actually dropped 9 pounds since discharge, current weight is 180 pounds based on home scale.  Pending basic metabolic panel to make sure he is not dehydrated  8. LV thrombus: On Eliquis  9. PAF on Eliquis: Continue Eliquis.  Heart rate very well controlled  10. CVA: No recurrence   COVID-19 Education: The signs and symptoms of COVID-19 were discussed with the patient and how to seek care for testing (follow up with PCP or arrange E-visit).  The importance of social distancing was discussed today.  Patient Risk:   After full review of this patient's clinical status, I feel that they are at least moderate risk at this time.  Time:   Today, I have spent 16 minutes with the patient with telehealth technology discussing cardiac care, VT, and heart failure medication titration.     Medication Adjustments/Labs and Tests Ordered: Current medicines are reviewed at length with the patient today.  Concerns regarding medicines are outlined above.  Tests Ordered: Orders Placed This Encounter  Procedures   Basic metabolic panel   Medication Changes: No orders of the defined  types were placed in this encounter.   Disposition:  Follow up in 1 month(s)  Signed, Almyra Deforest, PA  04/11/2018 2:29 PM    [place]

## 2018-04-14 ENCOUNTER — Other Ambulatory Visit: Payer: Self-pay

## 2018-04-14 ENCOUNTER — Ambulatory Visit (INDEPENDENT_AMBULATORY_CARE_PROVIDER_SITE_OTHER): Payer: Medicare HMO

## 2018-04-14 ENCOUNTER — Telehealth: Payer: Self-pay

## 2018-04-14 DIAGNOSIS — N179 Acute kidney failure, unspecified: Secondary | ICD-10-CM

## 2018-04-14 DIAGNOSIS — R5383 Other fatigue: Secondary | ICD-10-CM | POA: Diagnosis not present

## 2018-04-14 DIAGNOSIS — J4540 Moderate persistent asthma, uncomplicated: Secondary | ICD-10-CM | POA: Diagnosis not present

## 2018-04-14 DIAGNOSIS — I5022 Chronic systolic (congestive) heart failure: Secondary | ICD-10-CM | POA: Diagnosis not present

## 2018-04-14 DIAGNOSIS — Z95810 Presence of automatic (implantable) cardiac defibrillator: Secondary | ICD-10-CM | POA: Diagnosis not present

## 2018-04-14 DIAGNOSIS — R05 Cough: Secondary | ICD-10-CM | POA: Diagnosis not present

## 2018-04-14 DIAGNOSIS — G4733 Obstructive sleep apnea (adult) (pediatric): Secondary | ICD-10-CM | POA: Diagnosis not present

## 2018-04-14 NOTE — Telephone Encounter (Signed)
Spoke with pt wife regarding MyChart video visit on 04/15/18. Pt wife was advised to have pt BP and pulse checked prior to appt. Pt wife stated pt had no concerns.

## 2018-04-15 ENCOUNTER — Telehealth (INDEPENDENT_AMBULATORY_CARE_PROVIDER_SITE_OTHER): Payer: Medicare HMO | Admitting: Internal Medicine

## 2018-04-15 VITALS — BP 118/59 | HR 61 | Wt 187.0 lb

## 2018-04-15 DIAGNOSIS — I480 Paroxysmal atrial fibrillation: Secondary | ICD-10-CM

## 2018-04-15 DIAGNOSIS — I5022 Chronic systolic (congestive) heart failure: Secondary | ICD-10-CM | POA: Diagnosis not present

## 2018-04-15 DIAGNOSIS — I110 Hypertensive heart disease with heart failure: Secondary | ICD-10-CM | POA: Diagnosis not present

## 2018-04-15 DIAGNOSIS — I472 Ventricular tachycardia, unspecified: Secondary | ICD-10-CM

## 2018-04-15 DIAGNOSIS — I255 Ischemic cardiomyopathy: Secondary | ICD-10-CM | POA: Diagnosis not present

## 2018-04-15 MED ORDER — AMIODARONE HCL 200 MG PO TABS: 200.0000 mg | ORAL_TABLET | Freq: Every day | ORAL | 3 refills | Status: AC

## 2018-04-15 NOTE — Progress Notes (Signed)
EPIC Encounter for ICM Monitoring  Patient Name: Nathaniel Bolton is a 78 y.o. male Date: 04/15/2018 Primary Care Physican: [name], NP Primary Cardiologist:Jordan/[name] Electrophysiologist:Allred 03/26/2018 Weight:188 lbs 04/15/2018 Weight: 187 lbs   Spoke with wife.  She said patient is doing well. He had televisit with Dr Rayann Heman today.  Report: Thoracic impedancenormal.  Prescribed: Furosemide80 mgTake 1 tablet (80 mg total) by mouth 2 (two) times daily.Potassium 10 mEqTake 1 tablet (00 meq total) by mouth twice day.  Labs: 03/19/2018 Creatinine 2.16, BUN 47, Potassium 4.3, Sodium 135, GFR 29-33 03/18/2018 Creatinine 2.17, BUN 43, Potassium 4.3, Sodium 133, GFR 29-33  03/17/2018 Creatinine 2.47, BUN 36, Potassium 4.0, Sodium 134, GFR 24-28  03/16/2018 Creatinine 1.83, BUN 30, Potassium 4.4, Sodium 134, GFR 35-41  03/15/2018 Creatinine 1.84, BUN 29, Potassium 4.6, Sodium 139, GFR 35-40  03/10/2018 Creatinine2.12, BUN50, Potassium4.1, Sodium137, GFR29-34 01/09/2018 Creatinine1.92, BUN43, Potassium3.6, Sodium139, [id number]  12/09/2019Creatinine 1.93, BUN45, Potassium4.4, Sodium130, [id number]  12/03/2019Creatinine 2.37, BUN72, Potassium2.7, [id number], [id number] A complete set of results can be found in Results Review.  Recommendations:No changes and encouraged to call for fluid symptoms.   Follow-up plan: ICM clinic phone appointment on5/11/2018.    Copy of ICM check sent to Dr. Rayann Heman.  .  3 month ICM trend: 04/15/2018    1 Year ICM trend:       [name], RN 04/15/2018 1:00 PM

## 2018-04-15 NOTE — Progress Notes (Signed)
Electrophysiology TeleHealth Note   Due to national recommendations of social distancing due to COVID 19, an audio/video telehealth visit is felt to be most appropriate for this patient at this time.  See MyChart message from today for the patient's consent to telehealth for [facility].   Date:  04/15/2018   ID:  Nathaniel Bolton, DOB [birth year], MRN [id number]  Location: patient's home  Provider location: [address] [place]  Evaluation Performed: Follow-up visit  PCP:  [name], NP  Cardiologist:  Peter Martinique, MD / [facility] [place] [facility] Electrophysiologist:  Dr Rayann Heman  Chief Complaint:  VT  History of Present Illness:    Nathaniel Bolton is a 78 y.o. male who presents via audio/video conferencing for a telehealth visit today.  He was hospitalized in March with syncope and ICD shocks for VT at [facility] 03/13/2018 (notes reviewed).  He received IV amiodarone and was discharged on amiodarone 200mg  BID.  He feels "pretty good".  He feels weak.  Today, he denies symptoms of palpitations, chest pain, shortness of breath,  lower extremity edema, dizziness, presyncope, or syncope.  The patient is otherwise without complaint today.  The patient denies symptoms of fevers, chills, cough, or new SOB worrisome for COVID 19.  Past Medical History:  Diagnosis Date  . CAD (coronary artery disease)   . CKD stage 3 due to type 2 diabetes mellitus ([place])   . Diabetes mellitus    Type 2  . Erectile dysfunction   . Gout   . Hyperlipidemia   . Hypertension   . ICD (implantable cardiac defibrillator) in place   . Myocardial infarction, old    x2 in [place]. S/P CABG 2003, Dr Roxy Manns  . Obstructive lung disease (HCC)    Moderate per prior PFT's  . Paroxysmal atrial fibrillation ([place]) 3/17   discovered on event monitor  . Stroke [facility])    2013  . Systolic CHF, acute on chronic (HCC)    EF is 27% per myoview and echo October 2012  . Ventricular tachycardia  ([place]) 08/2014   175 bpm    Past Surgical History:  Procedure Laterality Date  . BYPASS GRAFT     x5  . CARDIAC DEFIBRILLATOR PLACEMENT  2008   [employer] by Dr Elonda Husky at [facility]  . cataract surgery    . CHOLECYSTECTOMY    . COLONOSCOPY  12/23/2006   Small colonic polyps, status post polypectomy. Interanl hemorrhoids.   . CORONARY ARTERY BYPASS GRAFT    . EYE SURGERY    . HERNIA REPAIR    . ICD GENERATOR CHANGEOUT N/A 08/30/2016   MDT Visia AF VR ICD implanted by Dr Rayann Heman  . KNEE ARTHROSCOPY     right  . UMBILICAL HERNIA REPAIR      Current Outpatient Medications  Medication Sig Dispense Refill  . allopurinol (ZYLOPRIM) 100 MG tablet Take 100 mg by mouth daily.    Marland Kitchen amiodarone (PACERONE) 200 MG tablet Take 1 tablet (200 mg total) by mouth 2 (two) times daily for 30 days. 60 tablet 2  . apixaban (ELIQUIS) 5 MG TABS tablet Take 1 tablet (5 mg total) by mouth 2 (two) times daily. 60 tablet 6  . Ascorbic Acid (VITAMIN C) 1000 MG tablet Take 1,000 mg by mouth daily.    Marland Kitchen atorvastatin (LIPITOR) 80 MG tablet Take 1 tablet (80 mg total) by mouth daily at 6 PM. 90 tablet 3  . carvedilol (COREG) 6.25  MG tablet Take 1 tablet (6.25 mg total) by mouth 2 (two) times daily with a meal. 180 tablet 3  . Cholecalciferol (VITAMIN D3) 2000 units TABS Take 2,000 Units by mouth 2 (two) times daily.     . cyclobenzaprine (FLEXERIL) 10 MG tablet Take 10 mg by mouth 2 (two) times daily.    . furosemide (LASIX) 80 MG tablet Take 1 tablet (80 mg total) by mouth 2 (two) times daily. 60 tablet 2  . gabapentin (NEURONTIN) 300 MG capsule Take 300 mg by mouth 3 (three) times daily.    . insulin aspart (NOVOLOG FLEXPEN) 100 UNIT/ML FlexPen inject 3 units if sugar is >151 ac meal; inject 2 units if sugar is btween 110 & 150, Do not inject any insulin  if your sugar is < 110 (Patient taking differently: Inject 2-3 Units into the skin 3 (three) times daily with meals. inject 3 units if sugar is greater 150   inject 2 units if sugar is btween 110 & 150,  Do not inject any insulin  if your sugar is < 110) 15 mL 11  . insulin glargine (LANTUS) 100 unit/mL SOPN Inject 0.1 mLs (10 Units total) into the skin daily. (Patient taking differently: Inject 10 Units into the skin at bedtime. ) 15 mL 11  . Lactobacillus (PROBIOTIC ACIDOPHILUS PO) Take 1 capsule by mouth 2 (two) times daily.    Marland Kitchen losartan (COZAAR) 25 MG tablet Take 25 mg by mouth daily.    Marland Kitchen NITROSTAT 0.4 MG SL tablet Place 0.4 mg under the tongue every 5 (five) minutes as needed for chest pain (MAX 3 TABLETS).     . NON FORMULARY Needles, syringes with needles, glucose reagent test strips    . Omega-3 Fatty Acids (FISH OIL PO) Take 2 tablets by mouth 2 (two) times daily.     Marland Kitchen omeprazole (PRILOSEC) 20 MG capsule Take 20 mg by mouth daily.    . potassium chloride (K-DUR,KLOR-CON) 10 MEQ tablet Take 1 tablet (10 mEq total) by mouth 2 (two) times daily. 180 tablet 1  . Saw Palmetto, Serenoa repens, (SAW PALMETTO PO) Take 1 tablet by mouth daily.     . sertraline (ZOLOFT) 100 MG tablet Take 50 mg by mouth daily.     Marland Kitchen spironolactone (ALDACTONE) 25 MG tablet Take 1 tablet (25 mg total) by mouth daily. 90 tablet 1  . tamsulosin (FLOMAX) 0.4 MG CAPS capsule Take 1 capsule (0.4 mg total) by mouth daily for 30 days. 30 capsule 0  . VENTOLIN HFA 108 (90 Base) MCG/ACT inhaler Inhale 2 puffs into the lungs every 4 (four) hours as needed for wheezing or shortness of breath.   0  . vitamin E 400 UNIT capsule Take 400 Units by mouth daily.      No current facility-administered medications for this visit.     Allergies:   Adhesive [tape]   Social History:  The patient  reports that he quit smoking about 29 years ago. He has never used smokeless tobacco. He reports that he does not drink alcohol or use drugs.   Family History:  The patient's  family history includes Heart attack in his brother and father; Heart disease in his sister; Heart failure in his  mother.   ROS:  Please see the history of present illness.   All other systems are personally reviewed and negative.    Exam:    Vital Signs:  BP (!) 118/59   Pulse 61   Wt 187 lb (  84.8 kg)   BMI 26.83 kg/m   Well appearing, alert and conversant, regular work of breathing,  good skin color Eyes- anicteric, neuro- grossly intact, skin- no apparent rash or lesions or cyanosis, mouth- oral mucosa is pink   Labs/Other Tests and Data Reviewed:    Recent Labs: 03/15/2018: ALT 24; B Natriuretic Peptide 394.0; Magnesium 2.0; TSH 2.868 03/17/2018: Hemoglobin 10.3; Platelets 133 03/19/2018: BUN 47; Creatinine, Ser 2.16; Potassium 4.3; Sodium 135   Wt Readings from Last 3 Encounters:  04/15/18 187 lb (84.8 kg)  04/11/18 188 lb (85.3 kg)  04/10/18 189 lb (85.7 kg)     Other studies personally reviewed: Additional studies/ records that were reviewed today include: Isaac Laud Meng's recent note, prior hospital records   Last device remote is reviewed from [facility] dated 03/12/2018 which reveals normal device function, no arrhythmias   ASSESSMENT & PLAN:    1.  VT Recently hospitalized for symptomatic VT with syncope and ICD shocks at [facility] (notes reviewed) Started on amiodarone. We will reduce to 200mg  daily today No driving x 6 months (pt aware) Will need LFTs, TFTs, and PFTs after COVID 19 has passed (tentatively when he sees Dr Haroldine Laws in June)  2. CAD/ ischemic CM s/p CABG/ chronic systolic dysfunction No ischemic symptoms currently No changes  3. HTN Stable No change required today  4. Paroxysmal atrial fibrillation On eliquis,  Prior stroke and LV thrombus also noted   COVID 19 screen The patient denies symptoms of COVID 19 at this time.  The importance of social distancing was discussed today.  Follow-up:  With Dr Haroldine Laws in June as scheduled.  Will need LFTs, TFTs at that time. I will see again in 6 months Next remote: 06/2018  Current medicines are  reviewed at length with the patient today.   The patient does not have concerns regarding his medicines.  The following changes were made today:  none  Labs/ tests ordered today include:  No orders of the defined types were placed in this encounter.   Patient Risk:  after full review of this patients clinical status, I feel that they are at high risk at this time.  He will follow-up with EP NP in 6 months.  He has recently had sustained symptomatic ventricular arrhythmias.  He is at risk for decompensation/ hospitalization/ fatal arrhythmias.  A high level of decision making was required for this encounter.  Today, I have spent 30 minutes with the patient with telehealth technology discussing VT .    Army Fossa, MD  04/15/2018 11:33 AM     [address] [place] [phone] (office) [phone] (fax)

## 2018-04-16 DIAGNOSIS — Z136 Encounter for screening for cardiovascular disorders: Secondary | ICD-10-CM | POA: Diagnosis not present

## 2018-04-16 DIAGNOSIS — Z1339 Encounter for screening examination for other mental health and behavioral disorders: Secondary | ICD-10-CM | POA: Diagnosis not present

## 2018-04-16 DIAGNOSIS — Z1331 Encounter for screening for depression: Secondary | ICD-10-CM | POA: Diagnosis not present

## 2018-04-16 DIAGNOSIS — E785 Hyperlipidemia, unspecified: Secondary | ICD-10-CM | POA: Diagnosis not present

## 2018-04-16 DIAGNOSIS — Z23 Encounter for immunization: Secondary | ICD-10-CM | POA: Diagnosis not present

## 2018-04-16 DIAGNOSIS — Z0000 Encounter for general adult medical examination without abnormal findings: Secondary | ICD-10-CM | POA: Diagnosis not present

## 2018-04-16 DIAGNOSIS — Z125 Encounter for screening for malignant neoplasm of prostate: Secondary | ICD-10-CM | POA: Diagnosis not present

## 2018-04-16 DIAGNOSIS — Z9181 History of falling: Secondary | ICD-10-CM | POA: Diagnosis not present

## 2018-04-21 ENCOUNTER — Encounter: Payer: Medicare HMO | Admitting: Internal Medicine

## 2018-04-23 DIAGNOSIS — N183 Chronic kidney disease, stage 3 (moderate): Secondary | ICD-10-CM | POA: Diagnosis not present

## 2018-04-23 DIAGNOSIS — E1122 Type 2 diabetes mellitus with diabetic chronic kidney disease: Secondary | ICD-10-CM | POA: Diagnosis not present

## 2018-04-23 DIAGNOSIS — E1151 Type 2 diabetes mellitus with diabetic peripheral angiopathy without gangrene: Secondary | ICD-10-CM | POA: Diagnosis not present

## 2018-04-23 DIAGNOSIS — J42 Unspecified chronic bronchitis: Secondary | ICD-10-CM | POA: Diagnosis not present

## 2018-04-23 DIAGNOSIS — I5022 Chronic systolic (congestive) heart failure: Secondary | ICD-10-CM | POA: Diagnosis not present

## 2018-04-23 DIAGNOSIS — I2510 Atherosclerotic heart disease of native coronary artery without angina pectoris: Secondary | ICD-10-CM | POA: Diagnosis not present

## 2018-04-23 DIAGNOSIS — I130 Hypertensive heart and chronic kidney disease with heart failure and stage 1 through stage 4 chronic kidney disease, or unspecified chronic kidney disease: Secondary | ICD-10-CM | POA: Diagnosis not present

## 2018-04-23 DIAGNOSIS — I480 Paroxysmal atrial fibrillation: Secondary | ICD-10-CM | POA: Diagnosis not present

## 2018-04-23 DIAGNOSIS — I472 Ventricular tachycardia: Secondary | ICD-10-CM | POA: Diagnosis not present

## 2018-04-24 DIAGNOSIS — E1151 Type 2 diabetes mellitus with diabetic peripheral angiopathy without gangrene: Secondary | ICD-10-CM | POA: Diagnosis not present

## 2018-04-24 DIAGNOSIS — I2510 Atherosclerotic heart disease of native coronary artery without angina pectoris: Secondary | ICD-10-CM | POA: Diagnosis not present

## 2018-04-24 DIAGNOSIS — I5022 Chronic systolic (congestive) heart failure: Secondary | ICD-10-CM | POA: Diagnosis not present

## 2018-04-24 DIAGNOSIS — I480 Paroxysmal atrial fibrillation: Secondary | ICD-10-CM | POA: Diagnosis not present

## 2018-04-24 DIAGNOSIS — E1122 Type 2 diabetes mellitus with diabetic chronic kidney disease: Secondary | ICD-10-CM | POA: Diagnosis not present

## 2018-04-24 DIAGNOSIS — N183 Chronic kidney disease, stage 3 (moderate): Secondary | ICD-10-CM | POA: Diagnosis not present

## 2018-04-24 DIAGNOSIS — I130 Hypertensive heart and chronic kidney disease with heart failure and stage 1 through stage 4 chronic kidney disease, or unspecified chronic kidney disease: Secondary | ICD-10-CM | POA: Diagnosis not present

## 2018-04-24 DIAGNOSIS — J42 Unspecified chronic bronchitis: Secondary | ICD-10-CM | POA: Diagnosis not present

## 2018-04-24 DIAGNOSIS — I472 Ventricular tachycardia: Secondary | ICD-10-CM | POA: Diagnosis not present

## 2018-04-28 ENCOUNTER — Other Ambulatory Visit: Payer: Self-pay

## 2018-04-28 DIAGNOSIS — E1151 Type 2 diabetes mellitus with diabetic peripheral angiopathy without gangrene: Secondary | ICD-10-CM | POA: Diagnosis not present

## 2018-04-28 DIAGNOSIS — J42 Unspecified chronic bronchitis: Secondary | ICD-10-CM | POA: Diagnosis not present

## 2018-04-28 DIAGNOSIS — I5022 Chronic systolic (congestive) heart failure: Secondary | ICD-10-CM | POA: Diagnosis not present

## 2018-04-28 DIAGNOSIS — I2510 Atherosclerotic heart disease of native coronary artery without angina pectoris: Secondary | ICD-10-CM | POA: Diagnosis not present

## 2018-04-28 DIAGNOSIS — N183 Chronic kidney disease, stage 3 (moderate): Secondary | ICD-10-CM | POA: Diagnosis not present

## 2018-04-28 DIAGNOSIS — E1122 Type 2 diabetes mellitus with diabetic chronic kidney disease: Secondary | ICD-10-CM | POA: Diagnosis not present

## 2018-04-28 DIAGNOSIS — I472 Ventricular tachycardia: Secondary | ICD-10-CM | POA: Diagnosis not present

## 2018-04-28 DIAGNOSIS — I130 Hypertensive heart and chronic kidney disease with heart failure and stage 1 through stage 4 chronic kidney disease, or unspecified chronic kidney disease: Secondary | ICD-10-CM | POA: Diagnosis not present

## 2018-04-28 DIAGNOSIS — I480 Paroxysmal atrial fibrillation: Secondary | ICD-10-CM | POA: Diagnosis not present

## 2018-04-28 NOTE — Patient Outreach (Signed)
Telephone assessment:  1:54 pm placed call to patient with no answer. Left a message requesting a call back.  4:15  Voicemail received from wife. 4:45 pm  Returned call. Wife states patients weight has gone up 4 pounds and she gave him an extra lasix today. Reports slight swelling of the feet.  Reports no shortness of breath, Wife states patient has been falling daily. Primary MD ordered home health PT which was started last week, Therapist out to see patient today.   PLAN: reviewed fall precautions. Reviewed use of walker. Reviewed heart failure zones with wife. Also discussed importance of calling MD tomorrow if weight does not come back down, Wife voiced understanding. Will plan follow up in 2 weeks.   THN CM Care Plan Problem One     Most Recent Value  Care Plan Problem One  Recent admission for hear failure  Role Documenting the Problem One  Care Management [place] for Problem One  Active  THN Long Term Goal   Patient and or wife will report no readmissions to the hospital for heart failure in the next 60 days.   THN Long Term Goal Start Date  03/21/18  Interventions for Problem One Long Term Goal  Reviewed current weight. Wife gave a extra dose of lasix today. encouraged wife ot call MD tomorrow if weight does not decrease,   THN CM Short Term Goal #1   Patient and or wife will report weighing and recording daily weights for the next 30 days.   THN CM Short Term Goal #1 Start Date  03/21/18  [facility] CM Short Term Goal #1 Met Date  04/28/18  THN CM Short Term Goal #2   Paitent and or wife will report following up with MD as planned on 03/31/2018.  THN CM Short Term Goal #2 Start Date  03/21/18  [facility] CM Short Term Goal #2 Met Date  03/28/18    [facility] CM Care Plan Problem Two     Most Recent Value  Care Plan Problem Two  Fall risk  Role Documenting the Problem Two  Care Management Coordinator  Care Plan for Problem Two  Active  Interventions for Problem Two Long Term Goal    Reviewed importance of doing home PT exercises. reviewed use of walker.   THN Long Term Goal  Patient and or wife will report no falls in the next 31 days.   THN Long Term Goal Start Date  04/28/18 Sudie Grumbling restarted due to falls]       Tomasa Rand, RN, BSN, CEN [place] Coordinator [phone]

## 2018-05-01 DIAGNOSIS — E1151 Type 2 diabetes mellitus with diabetic peripheral angiopathy without gangrene: Secondary | ICD-10-CM | POA: Diagnosis not present

## 2018-05-01 DIAGNOSIS — I5022 Chronic systolic (congestive) heart failure: Secondary | ICD-10-CM | POA: Diagnosis not present

## 2018-05-01 DIAGNOSIS — E1122 Type 2 diabetes mellitus with diabetic chronic kidney disease: Secondary | ICD-10-CM | POA: Diagnosis not present

## 2018-05-01 DIAGNOSIS — I130 Hypertensive heart and chronic kidney disease with heart failure and stage 1 through stage 4 chronic kidney disease, or unspecified chronic kidney disease: Secondary | ICD-10-CM | POA: Diagnosis not present

## 2018-05-01 DIAGNOSIS — I472 Ventricular tachycardia: Secondary | ICD-10-CM | POA: Diagnosis not present

## 2018-05-01 DIAGNOSIS — I480 Paroxysmal atrial fibrillation: Secondary | ICD-10-CM | POA: Diagnosis not present

## 2018-05-01 DIAGNOSIS — I2510 Atherosclerotic heart disease of native coronary artery without angina pectoris: Secondary | ICD-10-CM | POA: Diagnosis not present

## 2018-05-01 DIAGNOSIS — N183 Chronic kidney disease, stage 3 (moderate): Secondary | ICD-10-CM | POA: Diagnosis not present

## 2018-05-01 DIAGNOSIS — J42 Unspecified chronic bronchitis: Secondary | ICD-10-CM | POA: Diagnosis not present

## 2018-05-02 DIAGNOSIS — J4540 Moderate persistent asthma, uncomplicated: Secondary | ICD-10-CM | POA: Diagnosis not present

## 2018-05-02 DIAGNOSIS — J449 Chronic obstructive pulmonary disease, unspecified: Secondary | ICD-10-CM | POA: Diagnosis not present

## 2018-05-04 DIAGNOSIS — G4733 Obstructive sleep apnea (adult) (pediatric): Secondary | ICD-10-CM | POA: Diagnosis not present

## 2018-05-05 ENCOUNTER — Telehealth: Payer: Self-pay

## 2018-05-05 ENCOUNTER — Ambulatory Visit (INDEPENDENT_AMBULATORY_CARE_PROVIDER_SITE_OTHER): Payer: Medicare HMO

## 2018-05-05 ENCOUNTER — Other Ambulatory Visit: Payer: Self-pay

## 2018-05-05 DIAGNOSIS — I472 Ventricular tachycardia: Secondary | ICD-10-CM | POA: Diagnosis not present

## 2018-05-05 DIAGNOSIS — I5022 Chronic systolic (congestive) heart failure: Secondary | ICD-10-CM | POA: Diagnosis not present

## 2018-05-05 DIAGNOSIS — J42 Unspecified chronic bronchitis: Secondary | ICD-10-CM | POA: Diagnosis not present

## 2018-05-05 DIAGNOSIS — I130 Hypertensive heart and chronic kidney disease with heart failure and stage 1 through stage 4 chronic kidney disease, or unspecified chronic kidney disease: Secondary | ICD-10-CM | POA: Diagnosis not present

## 2018-05-05 DIAGNOSIS — Z95810 Presence of automatic (implantable) cardiac defibrillator: Secondary | ICD-10-CM

## 2018-05-05 DIAGNOSIS — E1151 Type 2 diabetes mellitus with diabetic peripheral angiopathy without gangrene: Secondary | ICD-10-CM | POA: Diagnosis not present

## 2018-05-05 DIAGNOSIS — I480 Paroxysmal atrial fibrillation: Secondary | ICD-10-CM | POA: Diagnosis not present

## 2018-05-05 DIAGNOSIS — I2510 Atherosclerotic heart disease of native coronary artery without angina pectoris: Secondary | ICD-10-CM | POA: Diagnosis not present

## 2018-05-05 DIAGNOSIS — E1122 Type 2 diabetes mellitus with diabetic chronic kidney disease: Secondary | ICD-10-CM | POA: Diagnosis not present

## 2018-05-05 DIAGNOSIS — N183 Chronic kidney disease, stage 3 (moderate): Secondary | ICD-10-CM | POA: Diagnosis not present

## 2018-05-05 NOTE — Telephone Encounter (Signed)
See 05/05/2018 ICM note for reviewed transmission results and follow up.

## 2018-05-05 NOTE — Telephone Encounter (Signed)
Received message from wife reporting pt has gained 5 lbs in last 2 days. Attempted return call to wife.  Advised to send remote transmission review for review and will call her back.

## 2018-05-05 NOTE — Progress Notes (Signed)
EPIC Encounter for ICM Monitoring  Patient Name: Nathaniel Bolton is a 78 y.o. male Date: 05/05/2018 Primary Care Physican: [name], NP Primary Cardiologist:Jordan/[name] Electrophysiologist:Allred 3/18/2020Weight:188 lbs 04/15/2018 Weight: 187 lbs 05/05/2018 Weight: 190 lbs  VT-NS (>4 beats, >150 bpm) 10   Call from wife to report pt has feet swelling and gained 5 lbs in the last 2 days.   He has taken 1 extra tablet of Furosemide 80 mg yesterday and today without an increase in urine output. She reports limiting salt and fluid intake to the recommended limits. Denies eating fast foods.   Report: Thoracic impedancenormal and not correlating with patient's symptoms.   Prescribed: Furosemide80 mgTake 1 tablet (80 mg total) by mouth 2 (two) times daily.Potassium 10 mEqTake1tablet(00 meq total) by mouth twice day.  Labs: 03/19/2018 Creatinine2.16, BUN47, Potassium4.3, Sodium135, [id number] 03/18/2018 Creatinine2.17, BUN43, Potassium4.3, Sodium133, [id number]  03/17/2018 Creatinine2.47, BUN36, Potassium4.0, [id number], [id number]  03/16/2018 Creatinine1.83, BUN30, Potassium4.4, [id number], SXQ82-08  03/15/2018 Creatinine1.84, BUN29, Potassium4.6, [id number], GFR35-40 03/10/2018 Creatinine2.12, BUN50, Potassium4.1, DIXVEZ501, [id number] 01/09/2018 Creatinine1.92, BUN43, Potassium3.6, KVTXLE174, [id number]  12/09/2019Creatinine 1.93, BUN45, Potassium4.4, Sodium130, YDS89-79  12/03/2019Creatinine 2.37, BUN72, Potassium2.7, [id number], [id number] A complete set of results can be found in Results Review.  Recommendations:Advised would send to Dr Haroldine Laws for recommendations if needed.    Follow-up plan: ICM clinic phone appointment on5/11/2018 or sooner if needed.   Copy of ICM check sent to Dr.Allred.   3 month ICM trend: 05/05/2018    1 Year ICM trend:       [name], RN 05/05/2018 4:03 PM

## 2018-05-06 NOTE — Progress Notes (Signed)
Wife returned call.  She reported pt's weight remains 5 lbs above baseline and is 190 lbs today. Provided Dr Benimhon's recommendation to limit the salt intake and to increase activity if possible.  She said patient is not urinating a lot and wonders if it has something to do with taking Flomax that is prescribed by the nephrologist.  Advised to contact nephrologist today before giving anymore extra Furosemide and to discuss patient's urine output and inform that patient has taken 2 days of extra Furosemide.  She verbalized understanding.  Advised condition worsens to use ER if needed.

## 2018-05-06 NOTE — Progress Notes (Signed)
Attempted call to wife to advise of Dr [name]'s recommendation. Left message to return call.

## 2018-05-07 DIAGNOSIS — I5022 Chronic systolic (congestive) heart failure: Secondary | ICD-10-CM | POA: Diagnosis not present

## 2018-05-07 DIAGNOSIS — I480 Paroxysmal atrial fibrillation: Secondary | ICD-10-CM | POA: Diagnosis not present

## 2018-05-07 DIAGNOSIS — E1151 Type 2 diabetes mellitus with diabetic peripheral angiopathy without gangrene: Secondary | ICD-10-CM | POA: Diagnosis not present

## 2018-05-07 DIAGNOSIS — E1122 Type 2 diabetes mellitus with diabetic chronic kidney disease: Secondary | ICD-10-CM | POA: Diagnosis not present

## 2018-05-07 DIAGNOSIS — Z125 Encounter for screening for malignant neoplasm of prostate: Secondary | ICD-10-CM | POA: Diagnosis not present

## 2018-05-07 DIAGNOSIS — I130 Hypertensive heart and chronic kidney disease with heart failure and stage 1 through stage 4 chronic kidney disease, or unspecified chronic kidney disease: Secondary | ICD-10-CM | POA: Diagnosis not present

## 2018-05-07 DIAGNOSIS — R339 Retention of urine, unspecified: Secondary | ICD-10-CM | POA: Diagnosis not present

## 2018-05-07 DIAGNOSIS — N401 Enlarged prostate with lower urinary tract symptoms: Secondary | ICD-10-CM | POA: Diagnosis not present

## 2018-05-07 DIAGNOSIS — I472 Ventricular tachycardia: Secondary | ICD-10-CM | POA: Diagnosis not present

## 2018-05-07 DIAGNOSIS — N183 Chronic kidney disease, stage 3 (moderate): Secondary | ICD-10-CM | POA: Diagnosis not present

## 2018-05-07 DIAGNOSIS — J42 Unspecified chronic bronchitis: Secondary | ICD-10-CM | POA: Diagnosis not present

## 2018-05-07 DIAGNOSIS — I2510 Atherosclerotic heart disease of native coronary artery without angina pectoris: Secondary | ICD-10-CM | POA: Diagnosis not present

## 2018-05-09 DIAGNOSIS — Z6827 Body mass index (BMI) 27.0-27.9, adult: Secondary | ICD-10-CM | POA: Diagnosis not present

## 2018-05-09 DIAGNOSIS — S5010XA Contusion of unspecified forearm, initial encounter: Secondary | ICD-10-CM | POA: Diagnosis not present

## 2018-05-09 DIAGNOSIS — S50911A Unspecified superficial injury of right forearm, initial encounter: Secondary | ICD-10-CM | POA: Diagnosis not present

## 2018-05-12 ENCOUNTER — Other Ambulatory Visit: Payer: Self-pay

## 2018-05-12 NOTE — Patient Outreach (Signed)
Telephone assessment:  Placed call to patient and spoke with wife [name]. Wife reports that patient is doing well. Reports weight of 193 pounds today. Wife giving extra lasix as directed. Wife reports patient continues to fall. Reports 4 falls last week. Reports home health PT continues to see patient. Wife reports home health PT supervisor is coming out to do an assessment and offer suggestions.  Patient has seen primary care giver within the last week due to decreased urinary output. Placed on dutasteride.   PLAN: reviewed need to continue to use walker. Encouraged patient to do exercises while sitting.   Reviewed need to continue to weigh daily and call MD with weight gain.  Will plan follow up in 1 month via phone.  Tomasa Rand, RN, BSN, CEN [facility] [employer] [phone]

## 2018-05-14 DIAGNOSIS — I480 Paroxysmal atrial fibrillation: Secondary | ICD-10-CM | POA: Diagnosis not present

## 2018-05-14 DIAGNOSIS — E1122 Type 2 diabetes mellitus with diabetic chronic kidney disease: Secondary | ICD-10-CM | POA: Diagnosis not present

## 2018-05-14 DIAGNOSIS — I5022 Chronic systolic (congestive) heart failure: Secondary | ICD-10-CM | POA: Diagnosis not present

## 2018-05-14 DIAGNOSIS — J42 Unspecified chronic bronchitis: Secondary | ICD-10-CM | POA: Diagnosis not present

## 2018-05-14 DIAGNOSIS — N183 Chronic kidney disease, stage 3 (moderate): Secondary | ICD-10-CM | POA: Diagnosis not present

## 2018-05-14 DIAGNOSIS — I130 Hypertensive heart and chronic kidney disease with heart failure and stage 1 through stage 4 chronic kidney disease, or unspecified chronic kidney disease: Secondary | ICD-10-CM | POA: Diagnosis not present

## 2018-05-14 DIAGNOSIS — I2510 Atherosclerotic heart disease of native coronary artery without angina pectoris: Secondary | ICD-10-CM | POA: Diagnosis not present

## 2018-05-14 DIAGNOSIS — I472 Ventricular tachycardia: Secondary | ICD-10-CM | POA: Diagnosis not present

## 2018-05-14 DIAGNOSIS — E1151 Type 2 diabetes mellitus with diabetic peripheral angiopathy without gangrene: Secondary | ICD-10-CM | POA: Diagnosis not present

## 2018-05-16 DIAGNOSIS — I472 Ventricular tachycardia: Secondary | ICD-10-CM | POA: Diagnosis not present

## 2018-05-16 DIAGNOSIS — E1151 Type 2 diabetes mellitus with diabetic peripheral angiopathy without gangrene: Secondary | ICD-10-CM | POA: Diagnosis not present

## 2018-05-16 DIAGNOSIS — I130 Hypertensive heart and chronic kidney disease with heart failure and stage 1 through stage 4 chronic kidney disease, or unspecified chronic kidney disease: Secondary | ICD-10-CM | POA: Diagnosis not present

## 2018-05-16 DIAGNOSIS — E1122 Type 2 diabetes mellitus with diabetic chronic kidney disease: Secondary | ICD-10-CM | POA: Diagnosis not present

## 2018-05-16 DIAGNOSIS — N183 Chronic kidney disease, stage 3 (moderate): Secondary | ICD-10-CM | POA: Diagnosis not present

## 2018-05-16 DIAGNOSIS — I2510 Atherosclerotic heart disease of native coronary artery without angina pectoris: Secondary | ICD-10-CM | POA: Diagnosis not present

## 2018-05-16 DIAGNOSIS — J42 Unspecified chronic bronchitis: Secondary | ICD-10-CM | POA: Diagnosis not present

## 2018-05-16 DIAGNOSIS — I5022 Chronic systolic (congestive) heart failure: Secondary | ICD-10-CM | POA: Diagnosis not present

## 2018-05-16 DIAGNOSIS — I480 Paroxysmal atrial fibrillation: Secondary | ICD-10-CM | POA: Diagnosis not present

## 2018-05-19 ENCOUNTER — Other Ambulatory Visit: Payer: Self-pay

## 2018-05-19 ENCOUNTER — Ambulatory Visit (INDEPENDENT_AMBULATORY_CARE_PROVIDER_SITE_OTHER): Payer: Medicare HMO

## 2018-05-19 DIAGNOSIS — I472 Ventricular tachycardia: Secondary | ICD-10-CM | POA: Diagnosis not present

## 2018-05-19 DIAGNOSIS — I130 Hypertensive heart and chronic kidney disease with heart failure and stage 1 through stage 4 chronic kidney disease, or unspecified chronic kidney disease: Secondary | ICD-10-CM | POA: Diagnosis not present

## 2018-05-19 DIAGNOSIS — I480 Paroxysmal atrial fibrillation: Secondary | ICD-10-CM | POA: Diagnosis not present

## 2018-05-19 DIAGNOSIS — N183 Chronic kidney disease, stage 3 (moderate): Secondary | ICD-10-CM | POA: Diagnosis not present

## 2018-05-19 DIAGNOSIS — I2510 Atherosclerotic heart disease of native coronary artery without angina pectoris: Secondary | ICD-10-CM | POA: Diagnosis not present

## 2018-05-19 DIAGNOSIS — E1122 Type 2 diabetes mellitus with diabetic chronic kidney disease: Secondary | ICD-10-CM | POA: Diagnosis not present

## 2018-05-19 DIAGNOSIS — J42 Unspecified chronic bronchitis: Secondary | ICD-10-CM | POA: Diagnosis not present

## 2018-05-19 DIAGNOSIS — E1151 Type 2 diabetes mellitus with diabetic peripheral angiopathy without gangrene: Secondary | ICD-10-CM | POA: Diagnosis not present

## 2018-05-19 DIAGNOSIS — Z95810 Presence of automatic (implantable) cardiac defibrillator: Secondary | ICD-10-CM

## 2018-05-19 DIAGNOSIS — I5022 Chronic systolic (congestive) heart failure: Secondary | ICD-10-CM | POA: Diagnosis not present

## 2018-05-20 DIAGNOSIS — G4733 Obstructive sleep apnea (adult) (pediatric): Secondary | ICD-10-CM | POA: Diagnosis not present

## 2018-05-20 DIAGNOSIS — J4540 Moderate persistent asthma, uncomplicated: Secondary | ICD-10-CM | POA: Diagnosis not present

## 2018-05-20 DIAGNOSIS — R05 Cough: Secondary | ICD-10-CM | POA: Diagnosis not present

## 2018-05-20 DIAGNOSIS — R5383 Other fatigue: Secondary | ICD-10-CM | POA: Diagnosis not present

## 2018-05-21 DIAGNOSIS — J42 Unspecified chronic bronchitis: Secondary | ICD-10-CM | POA: Diagnosis not present

## 2018-05-21 DIAGNOSIS — N183 Chronic kidney disease, stage 3 (moderate): Secondary | ICD-10-CM | POA: Diagnosis not present

## 2018-05-21 DIAGNOSIS — I130 Hypertensive heart and chronic kidney disease with heart failure and stage 1 through stage 4 chronic kidney disease, or unspecified chronic kidney disease: Secondary | ICD-10-CM | POA: Diagnosis not present

## 2018-05-21 DIAGNOSIS — I480 Paroxysmal atrial fibrillation: Secondary | ICD-10-CM | POA: Diagnosis not present

## 2018-05-21 DIAGNOSIS — I5022 Chronic systolic (congestive) heart failure: Secondary | ICD-10-CM | POA: Diagnosis not present

## 2018-05-21 DIAGNOSIS — I472 Ventricular tachycardia: Secondary | ICD-10-CM | POA: Diagnosis not present

## 2018-05-21 DIAGNOSIS — I2510 Atherosclerotic heart disease of native coronary artery without angina pectoris: Secondary | ICD-10-CM | POA: Diagnosis not present

## 2018-05-21 DIAGNOSIS — E1151 Type 2 diabetes mellitus with diabetic peripheral angiopathy without gangrene: Secondary | ICD-10-CM | POA: Diagnosis not present

## 2018-05-21 DIAGNOSIS — E1122 Type 2 diabetes mellitus with diabetic chronic kidney disease: Secondary | ICD-10-CM | POA: Diagnosis not present

## 2018-05-21 NOTE — Progress Notes (Signed)
EPIC Encounter for ICM Monitoring  Patient Name: Nathaniel Bolton is a 78 y.o. male Date: 05/21/2018 Primary Care Physican: [name], NP Primary Cardiologist:Jordan/[name] Electrophysiologist:Allred Nephrologist: [facility] 04/15/2018 Weight: 187 lbs 05/05/2018 Weight: 190 lbs 05/21/2018 Weight: Highest this past week was 199 lbs and has dropped to 194 lbs 05/21/2018.    Spoke with wife and patient is symptomatic with fluid in his lungs (per pulmonologist visit), very swollen lower extremities.  Nephrologist prescribed 3 consecutive days of Metolazone that started on 5/11 and then every Monday, Wed and Fri x 2 weeks.  If he does not improve, he may need hospitalization.  Optivol Thoracic impedancereturned to normal since taking Metolazone x 3 days (started 5/11).    Prescribed: Furosemide80 mgTake 1 tablet (80 mg total) by mouth 2 (two) times daily.Potassium 10 mEqTake1tablet(00 meq total) by mouth twice day.  Labs: 03/19/2018 Creatinine2.16, BUN47, Potassium4.3, Sodium135, [id number] 03/18/2018 Creatinine2.17, BUN43, Potassium4.3, Sodium133, [id number]  03/17/2018 Creatinine2.47, BUN36, Potassium4.0, Sodium134, [id number]  03/16/2018 Creatinine1.83, BUN30, Potassium4.4, Sodium134, GFR35-41  03/15/2018 Creatinine1.84, BUN29, Potassium4.6, Sodium139, GFR35-40 03/10/2018 Creatinine2.12, BUN50, Potassium4.1, [id number], [id number] A complete set of results can be found in Results Review.  Recommendations:Advised to continue to follow Nephrologist plan and will recheck fluid levels 06/03/2018.    Follow-up plan: ICM clinic phone appointment on5/26/2020 (manual send) to recheck fluid levels.   Office visit with Dr Haroldine Laws 06/11/2018.  Copy of ICM check sent to Dr.Allred and Dr Haroldine Laws.   3 month ICM trend: 05/21/2018    1 Year ICM trend:       [name], RN 05/21/2018 9:36 AM

## 2018-05-26 DIAGNOSIS — E1122 Type 2 diabetes mellitus with diabetic chronic kidney disease: Secondary | ICD-10-CM | POA: Diagnosis not present

## 2018-05-26 DIAGNOSIS — I2510 Atherosclerotic heart disease of native coronary artery without angina pectoris: Secondary | ICD-10-CM | POA: Diagnosis not present

## 2018-05-26 DIAGNOSIS — I472 Ventricular tachycardia: Secondary | ICD-10-CM | POA: Diagnosis not present

## 2018-05-26 DIAGNOSIS — N183 Chronic kidney disease, stage 3 (moderate): Secondary | ICD-10-CM | POA: Diagnosis not present

## 2018-05-26 DIAGNOSIS — E1151 Type 2 diabetes mellitus with diabetic peripheral angiopathy without gangrene: Secondary | ICD-10-CM | POA: Diagnosis not present

## 2018-05-26 DIAGNOSIS — I480 Paroxysmal atrial fibrillation: Secondary | ICD-10-CM | POA: Diagnosis not present

## 2018-05-26 DIAGNOSIS — I130 Hypertensive heart and chronic kidney disease with heart failure and stage 1 through stage 4 chronic kidney disease, or unspecified chronic kidney disease: Secondary | ICD-10-CM | POA: Diagnosis not present

## 2018-05-26 DIAGNOSIS — I5022 Chronic systolic (congestive) heart failure: Secondary | ICD-10-CM | POA: Diagnosis not present

## 2018-05-26 DIAGNOSIS — J42 Unspecified chronic bronchitis: Secondary | ICD-10-CM | POA: Diagnosis not present

## 2018-05-27 DIAGNOSIS — G4733 Obstructive sleep apnea (adult) (pediatric): Secondary | ICD-10-CM | POA: Diagnosis not present

## 2018-05-28 DIAGNOSIS — Z794 Long term (current) use of insulin: Secondary | ICD-10-CM | POA: Diagnosis not present

## 2018-05-28 DIAGNOSIS — Z4502 Encounter for adjustment and management of automatic implantable cardiac defibrillator: Secondary | ICD-10-CM | POA: Diagnosis not present

## 2018-05-28 DIAGNOSIS — J449 Chronic obstructive pulmonary disease, unspecified: Secondary | ICD-10-CM | POA: Diagnosis not present

## 2018-05-28 DIAGNOSIS — R55 Syncope and collapse: Secondary | ICD-10-CM | POA: Diagnosis not present

## 2018-05-28 DIAGNOSIS — R42 Dizziness and giddiness: Secondary | ICD-10-CM | POA: Diagnosis not present

## 2018-05-28 DIAGNOSIS — R279 Unspecified lack of coordination: Secondary | ICD-10-CM | POA: Diagnosis not present

## 2018-05-28 DIAGNOSIS — I471 Supraventricular tachycardia: Secondary | ICD-10-CM | POA: Diagnosis not present

## 2018-05-28 DIAGNOSIS — I255 Ischemic cardiomyopathy: Secondary | ICD-10-CM | POA: Diagnosis not present

## 2018-05-28 DIAGNOSIS — N189 Chronic kidney disease, unspecified: Secondary | ICD-10-CM | POA: Diagnosis not present

## 2018-05-28 DIAGNOSIS — Z7901 Long term (current) use of anticoagulants: Secondary | ICD-10-CM | POA: Diagnosis not present

## 2018-05-28 DIAGNOSIS — I2510 Atherosclerotic heart disease of native coronary artery without angina pectoris: Secondary | ICD-10-CM | POA: Diagnosis not present

## 2018-05-28 DIAGNOSIS — I130 Hypertensive heart and chronic kidney disease with heart failure and stage 1 through stage 4 chronic kidney disease, or unspecified chronic kidney disease: Secondary | ICD-10-CM | POA: Diagnosis not present

## 2018-05-28 DIAGNOSIS — Z951 Presence of aortocoronary bypass graft: Secondary | ICD-10-CM | POA: Diagnosis not present

## 2018-05-28 DIAGNOSIS — E872 Acidosis: Secondary | ICD-10-CM | POA: Diagnosis not present

## 2018-05-28 DIAGNOSIS — I509 Heart failure, unspecified: Secondary | ICD-10-CM | POA: Diagnosis not present

## 2018-05-28 DIAGNOSIS — E876 Hypokalemia: Secondary | ICD-10-CM | POA: Diagnosis not present

## 2018-05-28 DIAGNOSIS — I493 Ventricular premature depolarization: Secondary | ICD-10-CM | POA: Diagnosis not present

## 2018-05-28 DIAGNOSIS — E1122 Type 2 diabetes mellitus with diabetic chronic kidney disease: Secondary | ICD-10-CM | POA: Diagnosis not present

## 2018-05-28 DIAGNOSIS — I472 Ventricular tachycardia: Secondary | ICD-10-CM | POA: Diagnosis not present

## 2018-05-28 DIAGNOSIS — I420 Dilated cardiomyopathy: Secondary | ICD-10-CM | POA: Diagnosis not present

## 2018-05-28 DIAGNOSIS — J690 Pneumonitis due to inhalation of food and vomit: Secondary | ICD-10-CM | POA: Diagnosis not present

## 2018-05-28 DIAGNOSIS — J984 Other disorders of lung: Secondary | ICD-10-CM | POA: Diagnosis not present

## 2018-05-28 DIAGNOSIS — E1165 Type 2 diabetes mellitus with hyperglycemia: Secondary | ICD-10-CM | POA: Diagnosis not present

## 2018-05-28 DIAGNOSIS — Z743 Need for continuous supervision: Secondary | ICD-10-CM | POA: Diagnosis not present

## 2018-05-28 DIAGNOSIS — E119 Type 2 diabetes mellitus without complications: Secondary | ICD-10-CM | POA: Diagnosis not present

## 2018-05-28 DIAGNOSIS — I2119 ST elevation (STEMI) myocardial infarction involving other coronary artery of inferior wall: Secondary | ICD-10-CM | POA: Diagnosis not present

## 2018-05-28 DIAGNOSIS — J189 Pneumonia, unspecified organism: Secondary | ICD-10-CM | POA: Diagnosis not present

## 2018-05-28 DIAGNOSIS — I480 Paroxysmal atrial fibrillation: Secondary | ICD-10-CM | POA: Diagnosis not present

## 2018-05-28 DIAGNOSIS — Z95810 Presence of automatic (implantable) cardiac defibrillator: Secondary | ICD-10-CM | POA: Diagnosis not present

## 2018-05-28 DIAGNOSIS — J4540 Moderate persistent asthma, uncomplicated: Secondary | ICD-10-CM | POA: Diagnosis not present

## 2018-05-28 DIAGNOSIS — I5022 Chronic systolic (congestive) heart failure: Secondary | ICD-10-CM | POA: Diagnosis not present

## 2018-05-28 DIAGNOSIS — E1142 Type 2 diabetes mellitus with diabetic polyneuropathy: Secondary | ICD-10-CM | POA: Diagnosis not present

## 2018-05-28 DIAGNOSIS — I110 Hypertensive heart disease with heart failure: Secondary | ICD-10-CM | POA: Diagnosis not present

## 2018-05-28 DIAGNOSIS — J9602 Acute respiratory failure with hypercapnia: Secondary | ICD-10-CM | POA: Diagnosis not present

## 2018-05-28 DIAGNOSIS — I469 Cardiac arrest, cause unspecified: Secondary | ICD-10-CM | POA: Diagnosis not present

## 2018-05-28 DIAGNOSIS — N179 Acute kidney failure, unspecified: Secondary | ICD-10-CM | POA: Diagnosis not present

## 2018-05-30 ENCOUNTER — Telehealth: Payer: Self-pay

## 2018-05-30 NOTE — Telephone Encounter (Signed)
Called and spoke with patients wife she would like Dr.[name] to review patients chart for his current admission. Pt admitted to hospital [place] for heart attack on 4/20. Physician was going to discharge patient today but patient and his wife feel he is not ready to be sent home. They would like pt to be transferred to  or at the very least for Dr.[name] to give pts wife a call.  I told pts wife I would follow up with Dr.[name].

## 2018-05-30 NOTE — Telephone Encounter (Signed)
Returned call to wife as requested by voice mail message.   Patient is currently in [place] hospital due to heart attack.  She is concerned because the physician at that hospital wants to discharge patient today and she thinks he needs to be transferred to [facility] for further evaluation.  Advised to call Dr [name]'s or Dr Doug Sou office to discuss patient's current condition.  She will call Dr [name]'s office.  Will route call to Dr [name]'s office to make aware that patient is currently hospitalized and wife is seeking advice about transferring patient to [facility].

## 2018-06-03 ENCOUNTER — Telehealth: Payer: Self-pay | Admitting: Cardiology

## 2018-06-03 DIAGNOSIS — G4733 Obstructive sleep apnea (adult) (pediatric): Secondary | ICD-10-CM | POA: Diagnosis not present

## 2018-06-03 NOTE — Telephone Encounter (Signed)
Pt hospice nurse called and stated that pt wife called her in a panic b/c she could not get the home monitor to work. Pt hospice nurse requested tech support number so they could call and get help w/ the monitor. Provided [employer] tech support number to hospice nurse.

## 2018-06-03 NOTE — Telephone Encounter (Signed)
New monitor ordered 06/03/2018

## 2018-06-03 NOTE — Telephone Encounter (Signed)
Pt wife called and stated that they heard an alert tone from his device. She stated that he feels fine but he does have pneumonia. Instructed pt wife to send a remote transmission w/ pt home monitor. She verbalized understanding.

## 2018-06-04 ENCOUNTER — Telehealth (HOSPITAL_COMMUNITY): Payer: Self-pay | Admitting: Cardiology

## 2018-06-04 NOTE — Telephone Encounter (Signed)
Dr Rocky Link with [facility] called to report patient declined hospice services.  Wanted to make office aware and possibly arrange patient for other services (palliative, HH etc  Advised all this could be discussed at upcoming appt with provider as recent hospice referral was made after [facility] hospitalization.

## 2018-06-05 NOTE — Telephone Encounter (Signed)
The pt ICD is going off. The pt is in hospice. His new monitor has not came to send a transmission. The pt wife would like someone to give her a call. The best number to call the pt wife is [phone]. The pt wife name is [name].

## 2018-06-05 NOTE — Telephone Encounter (Signed)
Spoke with Darrick Penna, RN with hospice. Confirmed that pt is currently followed in hospice. Explained current situation with alert tone. Shelly reports that if pt needs OV, they can arrange ambulance transport. She is aware I will call pt's wife with an update. Darrick Penna denies questions or concerns at this time.

## 2018-06-05 NOTE — Telephone Encounter (Signed)
Follow up     1. Has your device fired? no  2. Is you device beeping? yes  3. Are you experiencing draining or swelling at device site? no  4. Are you calling to see if we received your device transmission? no  5. Have you passed out? No  Shelby with [facility] is calling because they spoke with [employer] and they are advising that the patient will have to be seen. They are wanting to set up an in office appt. They are aware that the wife has been in contact and someone will reach out to her once they have spoken with Dr. Rayann Heman.     Please route to [place]

## 2018-06-05 NOTE — Telephone Encounter (Signed)
Spoke with pt's wife. She reports that pt's alert tone continues to sound multiple times a day. New monitor is estimated to arrive in 5-7 more business days. She is not aware of any shocks or syncopal episodes since pt was most recently hospitalized at [facility] (with pneumonia and VT per notes in [facility]) from 5/20-5/25. Pt is essentially bed-bound now, currently receiving hospice care per wife. He would require transport to the office via ambulance.  Advised that pt's device will need in-person interrogation to clear alert. Explained alert tone will continue to sound until then and it doesn't necessarily mean pt is having any new alert-conditions. Pt's wife verbalizes understanding. Will discuss with Dr. Rayann Heman for recommendations.

## 2018-06-06 ENCOUNTER — Inpatient Hospital Stay (HOSPITAL_COMMUNITY)
Admission: EM | Admit: 2018-06-06 | Discharge: 2018-06-09 | DRG: 871 | Disposition: A | Discharge status: Hospice | Payer: No Typology Code available for payment source | Attending: Internal Medicine | Admitting: Internal Medicine

## 2018-06-06 ENCOUNTER — Telehealth: Payer: Self-pay

## 2018-06-06 ENCOUNTER — Emergency Department (HOSPITAL_COMMUNITY): Payer: No Typology Code available for payment source

## 2018-06-06 DIAGNOSIS — Z8673 Personal history of transient ischemic attack (TIA), and cerebral infarction without residual deficits: Secondary | ICD-10-CM

## 2018-06-06 DIAGNOSIS — E1159 Type 2 diabetes mellitus with other circulatory complications: Secondary | ICD-10-CM | POA: Diagnosis present

## 2018-06-06 DIAGNOSIS — Z95810 Presence of automatic (implantable) cardiac defibrillator: Secondary | ICD-10-CM | POA: Diagnosis present

## 2018-06-06 DIAGNOSIS — Z8674 Personal history of sudden cardiac arrest: Secondary | ICD-10-CM

## 2018-06-06 DIAGNOSIS — M109 Gout, unspecified: Secondary | ICD-10-CM | POA: Diagnosis present

## 2018-06-06 DIAGNOSIS — I255 Ischemic cardiomyopathy: Secondary | ICD-10-CM | POA: Diagnosis present

## 2018-06-06 DIAGNOSIS — E1122 Type 2 diabetes mellitus with diabetic chronic kidney disease: Secondary | ICD-10-CM | POA: Diagnosis present

## 2018-06-06 DIAGNOSIS — Z7401 Bed confinement status: Secondary | ICD-10-CM | POA: Diagnosis not present

## 2018-06-06 DIAGNOSIS — I480 Paroxysmal atrial fibrillation: Secondary | ICD-10-CM | POA: Diagnosis not present

## 2018-06-06 DIAGNOSIS — E860 Dehydration: Secondary | ICD-10-CM | POA: Diagnosis present

## 2018-06-06 DIAGNOSIS — J440 Chronic obstructive pulmonary disease with acute lower respiratory infection: Secondary | ICD-10-CM | POA: Diagnosis not present

## 2018-06-06 DIAGNOSIS — Z794 Long term (current) use of insulin: Secondary | ICD-10-CM

## 2018-06-06 DIAGNOSIS — Z87891 Personal history of nicotine dependence: Secondary | ICD-10-CM

## 2018-06-06 DIAGNOSIS — N183 Chronic kidney disease, stage 3 (moderate): Secondary | ICD-10-CM | POA: Diagnosis present

## 2018-06-06 DIAGNOSIS — R001 Bradycardia, unspecified: Secondary | ICD-10-CM | POA: Diagnosis not present

## 2018-06-06 DIAGNOSIS — E1121 Type 2 diabetes mellitus with diabetic nephropathy: Secondary | ICD-10-CM

## 2018-06-06 DIAGNOSIS — I10 Essential (primary) hypertension: Secondary | ICD-10-CM | POA: Diagnosis not present

## 2018-06-06 DIAGNOSIS — Z20828 Contact with and (suspected) exposure to other viral communicable diseases: Secondary | ICD-10-CM | POA: Diagnosis not present

## 2018-06-06 DIAGNOSIS — E782 Mixed hyperlipidemia: Secondary | ICD-10-CM | POA: Diagnosis present

## 2018-06-06 DIAGNOSIS — N179 Acute kidney failure, unspecified: Secondary | ICD-10-CM | POA: Diagnosis not present

## 2018-06-06 DIAGNOSIS — Z515 Encounter for palliative care: Secondary | ICD-10-CM | POA: Diagnosis not present

## 2018-06-06 DIAGNOSIS — I959 Hypotension, unspecified: Secondary | ICD-10-CM | POA: Diagnosis not present

## 2018-06-06 DIAGNOSIS — J9620 Acute and chronic respiratory failure, unspecified whether with hypoxia or hypercapnia: Secondary | ICD-10-CM | POA: Diagnosis not present

## 2018-06-06 DIAGNOSIS — I2510 Atherosclerotic heart disease of native coronary artery without angina pectoris: Secondary | ICD-10-CM | POA: Diagnosis present

## 2018-06-06 DIAGNOSIS — Y95 Nosocomial condition: Secondary | ICD-10-CM | POA: Diagnosis present

## 2018-06-06 DIAGNOSIS — I472 Ventricular tachycardia, unspecified: Secondary | ICD-10-CM

## 2018-06-06 DIAGNOSIS — I110 Hypertensive heart disease with heart failure: Secondary | ICD-10-CM | POA: Diagnosis present

## 2018-06-06 DIAGNOSIS — R509 Fever, unspecified: Secondary | ICD-10-CM

## 2018-06-06 DIAGNOSIS — R079 Chest pain, unspecified: Secondary | ICD-10-CM | POA: Diagnosis not present

## 2018-06-06 DIAGNOSIS — I5022 Chronic systolic (congestive) heart failure: Secondary | ICD-10-CM | POA: Diagnosis not present

## 2018-06-06 DIAGNOSIS — R0902 Hypoxemia: Secondary | ICD-10-CM | POA: Diagnosis not present

## 2018-06-06 DIAGNOSIS — J9621 Acute and chronic respiratory failure with hypoxia: Secondary | ICD-10-CM | POA: Diagnosis not present

## 2018-06-06 DIAGNOSIS — A419 Sepsis, unspecified organism: Principal | ICD-10-CM | POA: Diagnosis present

## 2018-06-06 DIAGNOSIS — I4891 Unspecified atrial fibrillation: Secondary | ICD-10-CM | POA: Diagnosis present

## 2018-06-06 DIAGNOSIS — J189 Pneumonia, unspecified organism: Secondary | ICD-10-CM | POA: Diagnosis present

## 2018-06-06 DIAGNOSIS — J9601 Acute respiratory failure with hypoxia: Secondary | ICD-10-CM | POA: Diagnosis not present

## 2018-06-06 DIAGNOSIS — Z66 Do not resuscitate: Secondary | ICD-10-CM | POA: Diagnosis not present

## 2018-06-06 DIAGNOSIS — R531 Weakness: Secondary | ICD-10-CM

## 2018-06-06 DIAGNOSIS — E871 Hypo-osmolality and hyponatremia: Secondary | ICD-10-CM

## 2018-06-06 DIAGNOSIS — M2550 Pain in unspecified joint: Secondary | ICD-10-CM | POA: Diagnosis not present

## 2018-06-06 DIAGNOSIS — N189 Chronic kidney disease, unspecified: Secondary | ICD-10-CM | POA: Diagnosis present

## 2018-06-06 DIAGNOSIS — Z951 Presence of aortocoronary bypass graft: Secondary | ICD-10-CM | POA: Diagnosis not present

## 2018-06-06 DIAGNOSIS — Z8249 Family history of ischemic heart disease and other diseases of the circulatory system: Secondary | ICD-10-CM

## 2018-06-06 DIAGNOSIS — I252 Old myocardial infarction: Secondary | ICD-10-CM | POA: Diagnosis not present

## 2018-06-06 DIAGNOSIS — E1142 Type 2 diabetes mellitus with diabetic polyneuropathy: Secondary | ICD-10-CM | POA: Diagnosis present

## 2018-06-06 DIAGNOSIS — Z9049 Acquired absence of other specified parts of digestive tract: Secondary | ICD-10-CM

## 2018-06-06 DIAGNOSIS — R0602 Shortness of breath: Secondary | ICD-10-CM | POA: Diagnosis not present

## 2018-06-06 DIAGNOSIS — Z7901 Long term (current) use of anticoagulants: Secondary | ICD-10-CM

## 2018-06-06 DIAGNOSIS — R05 Cough: Secondary | ICD-10-CM | POA: Diagnosis not present

## 2018-06-06 DIAGNOSIS — Z79899 Other long term (current) drug therapy: Secondary | ICD-10-CM

## 2018-06-06 DIAGNOSIS — Z91048 Other nonmedicinal substance allergy status: Secondary | ICD-10-CM

## 2018-06-06 DIAGNOSIS — I4729 Other ventricular tachycardia: Secondary | ICD-10-CM

## 2018-06-06 DIAGNOSIS — R6520 Severe sepsis without septic shock: Secondary | ICD-10-CM | POA: Diagnosis not present

## 2018-06-06 LAB — CBC WITH DIFFERENTIAL/PLATELET
Abs Immature Granulocytes: 0.21 10*3/uL — ABNORMAL HIGH (ref 0.00–0.07)
Basophils Absolute: 0.1 10*3/uL (ref 0.0–0.1)
Basophils Relative: 0 %
Eosinophils Absolute: 0.2 10*3/uL (ref 0.0–0.5)
Eosinophils Relative: 1 %
HCT: 33.3 % — ABNORMAL LOW (ref 39.0–52.0)
Hemoglobin: 11.4 g/dL — ABNORMAL LOW (ref 13.0–17.0)
Immature Granulocytes: 1 %
Lymphocytes Relative: 9 %
Lymphs Abs: 1.3 10*3/uL (ref 0.7–4.0)
MCH: 35.4 pg — ABNORMAL HIGH (ref 26.0–34.0)
MCHC: 34.2 g/dL (ref 30.0–36.0)
MCV: 103.4 fL — ABNORMAL HIGH (ref 80.0–100.0)
Monocytes Absolute: 1.5 10*3/uL — ABNORMAL HIGH (ref 0.1–1.0)
Monocytes Relative: 10 %
Neutro Abs: 11.6 10*3/uL — ABNORMAL HIGH (ref 1.7–7.7)
Neutrophils Relative %: 79 %
Platelets: 216 10*3/uL (ref 150–400)
RBC: 3.22 MIL/uL — ABNORMAL LOW (ref 4.22–5.81)
RDW: 12.6 % (ref 11.5–15.5)
WBC: 14.8 10*3/uL — ABNORMAL HIGH (ref 4.0–10.5)
nRBC: 0 % (ref 0.0–0.2)

## 2018-06-06 LAB — COMPREHENSIVE METABOLIC PANEL
ALT: 77 U/L — ABNORMAL HIGH (ref 0–44)
AST: 85 U/L — ABNORMAL HIGH (ref 15–41)
Albumin: 2.3 g/dL — ABNORMAL LOW (ref 3.5–5.0)
Alkaline Phosphatase: 78 U/L (ref 38–126)
Anion gap: 14 (ref 5–15)
BUN: 94 mg/dL — ABNORMAL HIGH (ref 8–23)
CO2: 25 mmol/L (ref 22–32)
Calcium: 8.5 mg/dL — ABNORMAL LOW (ref 8.9–10.3)
Chloride: 89 mmol/L — ABNORMAL LOW (ref 98–111)
Creatinine, Ser: 3.32 mg/dL — ABNORMAL HIGH (ref 0.61–1.24)
GFR calc Af Amer: 20 mL/min — ABNORMAL LOW (ref >60)
GFR calc non Af Amer: 17 mL/min — ABNORMAL LOW (ref >60)
Glucose, Bld: 301 mg/dL — ABNORMAL HIGH (ref 70–99)
Potassium: 4.3 mmol/L (ref 3.5–5.1)
Sodium: 128 mmol/L — ABNORMAL LOW (ref 135–145)
Total Bilirubin: 0.7 mg/dL (ref 0.3–1.2)
Total Protein: 6.4 g/dL — ABNORMAL LOW (ref 6.5–8.1)

## 2018-06-06 LAB — URINALYSIS, ROUTINE W REFLEX MICROSCOPIC
Bilirubin Urine: NEGATIVE
Glucose, UA: NEGATIVE mg/dL
Hgb urine dipstick: NEGATIVE
Ketones, ur: NEGATIVE mg/dL
Leukocytes,Ua: NEGATIVE
Nitrite: NEGATIVE
Protein, ur: NEGATIVE mg/dL
Specific Gravity, Urine: 1.011 (ref 1.005–1.030)
pH: 5 (ref 5.0–8.0)

## 2018-06-06 LAB — GLUCOSE, CAPILLARY: Glucose-Capillary: 172 mg/dL — ABNORMAL HIGH (ref 70–99)

## 2018-06-06 LAB — I-STAT TROPONIN, ED: Troponin i, poc: 0.03 ng/mL (ref 0.00–0.08)

## 2018-06-06 LAB — PROCALCITONIN: Procalcitonin: 0.32 ng/mL

## 2018-06-06 LAB — TROPONIN I: Troponin I: 0.04 ng/mL (ref <0.03)

## 2018-06-06 LAB — BRAIN NATRIURETIC PEPTIDE: B Natriuretic Peptide: 115.6 pg/mL — ABNORMAL HIGH (ref 0.0–100.0)

## 2018-06-06 LAB — LACTIC ACID, PLASMA: Lactic Acid, Venous: 1.2 mmol/L (ref 0.5–1.9)

## 2018-06-06 LAB — SARS CORONAVIRUS 2 BY RT PCR (HOSPITAL ORDER, PERFORMED IN ~~LOC~~ HOSPITAL LAB): SARS Coronavirus 2: NEGATIVE

## 2018-06-06 MED ORDER — SODIUM CHLORIDE 0.9 % IV SOLN: 1000.0000 mL | INTRAVENOUS | Status: DC

## 2018-06-06 MED ORDER — SODIUM CHLORIDE 0.9 % IV SOLN
1000.0000 mL | INTRAVENOUS | Status: DC
  Administered 2018-06-06: 1000 mL via INTRAVENOUS

## 2018-06-06 MED ORDER — GABAPENTIN 300 MG PO CAPS
300.0000 mg | ORAL_CAPSULE | Freq: Two times a day (BID) | ORAL | Status: DC
  Administered 2018-06-06 – 2018-06-09 (×6): 300 mg via ORAL
  Filled 2018-06-06 (×6): qty 1

## 2018-06-06 MED ORDER — VITAMIN D 25 MCG (1000 UNIT) PO TABS
2000.0000 [IU] | ORAL_TABLET | Freq: Two times a day (BID) | ORAL | Status: DC
  Administered 2018-06-06 – 2018-06-09 (×6): 2000 [IU] via ORAL
  Filled 2018-06-06 (×6): qty 2

## 2018-06-06 MED ORDER — SODIUM CHLORIDE 0.9 % IV SOLN
1000.0000 mL | INTRAVENOUS | Status: DC
  Administered 2018-06-07 – 2018-06-08 (×3): 1000 mL via INTRAVENOUS

## 2018-06-06 MED ORDER — SERTRALINE HCL 50 MG PO TABS
50.0000 mg | ORAL_TABLET | Freq: Every day | ORAL | Status: DC
  Administered 2018-06-07 – 2018-06-09 (×3): 50 mg via ORAL
  Filled 2018-06-06 (×3): qty 1

## 2018-06-06 MED ORDER — TRAMADOL HCL 50 MG PO TABS
50.0000 mg | ORAL_TABLET | Freq: Two times a day (BID) | ORAL | Status: DC | PRN
  Administered 2018-06-07 (×2): 50 mg via ORAL
  Filled 2018-06-06 (×2): qty 1

## 2018-06-06 MED ORDER — DUTASTERIDE 0.5 MG PO CAPS
0.5000 mg | ORAL_CAPSULE | Freq: Every day | ORAL | Status: DC
  Administered 2018-06-07 – 2018-06-08 (×3): 0.5 mg via ORAL
  Filled 2018-06-06 (×5): qty 1

## 2018-06-06 MED ORDER — GUAIFENESIN-CODEINE 100-10 MG/5ML PO SOLN
10.0000 mL | ORAL | Status: DC | PRN
  Filled 2018-06-06: qty 10

## 2018-06-06 MED ORDER — INSULIN ASPART 100 UNIT/ML ~~LOC~~ SOLN
0.0000 [IU] | Freq: Three times a day (TID) | SUBCUTANEOUS | Status: DC
  Administered 2018-06-07 (×2): 1 [IU] via SUBCUTANEOUS
  Administered 2018-06-07: 2 [IU] via SUBCUTANEOUS
  Administered 2018-06-08: 13:00:00 3 [IU] via SUBCUTANEOUS
  Administered 2018-06-08: 11:00:00 2 [IU] via SUBCUTANEOUS
  Administered 2018-06-08 – 2018-06-09 (×2): 1 [IU] via SUBCUTANEOUS
  Administered 2018-06-09: 3 [IU] via SUBCUTANEOUS
  Administered 2018-06-09: 1 [IU] via SUBCUTANEOUS

## 2018-06-06 MED ORDER — ALUM & MAG HYDROXIDE-SIMETH 200-200-20 MG/5ML PO SUSP
30.0000 mL | Freq: Every day | ORAL | Status: DC
  Administered 2018-06-07: 30 mL via ORAL
  Filled 2018-06-06: qty 30

## 2018-06-06 MED ORDER — FAMOTIDINE 20 MG PO TABS
20.0000 mg | ORAL_TABLET | Freq: Every day | ORAL | Status: DC
  Administered 2018-06-07 – 2018-06-09 (×3): 20 mg via ORAL
  Filled 2018-06-06 (×3): qty 1

## 2018-06-06 MED ORDER — ALBUTEROL SULFATE (2.5 MG/3ML) 0.083% IN NEBU: 2.5000 mg | INHALATION_SOLUTION | RESPIRATORY_TRACT | Status: DC | PRN

## 2018-06-06 MED ORDER — ALBUTEROL SULFATE HFA 108 (90 BASE) MCG/ACT IN AERS: 2.0000 | INHALATION_SPRAY | RESPIRATORY_TRACT | Status: DC | PRN

## 2018-06-06 MED ORDER — INSULIN GLARGINE 100 UNITS/ML SOLOSTAR PEN
10.0000 [IU] | PEN_INJECTOR | Freq: Every day | SUBCUTANEOUS | Status: DC
  Filled 2018-06-06: qty 3

## 2018-06-06 MED ORDER — SODIUM CHLORIDE 0.9 % IV SOLN
2.0000 g | INTRAVENOUS | Status: DC
  Administered 2018-06-07 – 2018-06-08 (×2): 2 g via INTRAVENOUS
  Filled 2018-06-06 (×2): qty 2

## 2018-06-06 MED ORDER — BUDESONIDE 0.25 MG/2ML IN SUSP
2.0000 mL | Freq: Two times a day (BID) | RESPIRATORY_TRACT | Status: DC
  Administered 2018-06-07: 0.25 mg via RESPIRATORY_TRACT
  Filled 2018-06-06: qty 2

## 2018-06-06 MED ORDER — VANCOMYCIN HCL 10 G IV SOLR
1750.0000 mg | Freq: Once | INTRAVENOUS | Status: AC
  Administered 2018-06-06: 1750 mg via INTRAVENOUS
  Filled 2018-06-06: qty 1750

## 2018-06-06 MED ORDER — APIXABAN 5 MG PO TABS
5.0000 mg | ORAL_TABLET | Freq: Two times a day (BID) | ORAL | Status: DC
  Administered 2018-06-06 – 2018-06-09 (×6): 5 mg via ORAL
  Filled 2018-06-06 (×6): qty 1

## 2018-06-06 MED ORDER — VITAMIN E 180 MG (400 UNIT) PO CAPS
400.0000 [IU] | ORAL_CAPSULE | Freq: Every day | ORAL | Status: DC
  Administered 2018-06-07 – 2018-06-09 (×3): 400 [IU] via ORAL
  Filled 2018-06-06 (×3): qty 1

## 2018-06-06 MED ORDER — SODIUM CHLORIDE 0.9 % IV BOLUS
1000.0000 mL | Freq: Once | INTRAVENOUS | Status: AC
  Administered 2018-06-06: 18:00:00 1000 mL via INTRAVENOUS

## 2018-06-06 MED ORDER — CARVEDILOL 12.5 MG PO TABS
6.2500 mg | ORAL_TABLET | Freq: Two times a day (BID) | ORAL | Status: DC
  Administered 2018-06-07 – 2018-06-09 (×5): 6.25 mg via ORAL
  Filled 2018-06-06 (×6): qty 1

## 2018-06-06 MED ORDER — VANCOMYCIN HCL IN DEXTROSE 1-5 GM/200ML-% IV SOLN: 1000.0000 mg | Freq: Once | INTRAVENOUS | Status: DC

## 2018-06-06 MED ORDER — ATORVASTATIN CALCIUM 80 MG PO TABS
80.0000 mg | ORAL_TABLET | Freq: Every day | ORAL | Status: DC
  Administered 2018-06-07 – 2018-06-09 (×3): 80 mg via ORAL
  Filled 2018-06-06 (×3): qty 1

## 2018-06-06 MED ORDER — ALLOPURINOL 100 MG PO TABS
100.0000 mg | ORAL_TABLET | Freq: Every day | ORAL | Status: DC
  Administered 2018-06-07 – 2018-06-09 (×3): 100 mg via ORAL
  Filled 2018-06-06 (×3): qty 1

## 2018-06-06 MED ORDER — TRAMADOL HCL 50 MG PO TABS: 50.0000 mg | ORAL_TABLET | Freq: Four times a day (QID) | ORAL | Status: DC | PRN

## 2018-06-06 MED ORDER — SODIUM CHLORIDE 0.9 % IV SOLN
2.0000 g | Freq: Once | INTRAVENOUS | Status: AC
  Administered 2018-06-06: 15:00:00 2 g via INTRAVENOUS
  Filled 2018-06-06: qty 2

## 2018-06-06 MED ORDER — VANCOMYCIN HCL 10 G IV SOLR
1250.0000 mg | INTRAVENOUS | Status: DC
  Administered 2018-06-08: 18:00:00 1250 mg via INTRAVENOUS
  Filled 2018-06-06: qty 1250

## 2018-06-06 MED ORDER — AMIODARONE HCL 200 MG PO TABS
200.0000 mg | ORAL_TABLET | Freq: Two times a day (BID) | ORAL | Status: DC
  Administered 2018-06-06 – 2018-06-09 (×6): 200 mg via ORAL
  Filled 2018-06-06 (×6): qty 1

## 2018-06-06 MED ORDER — INSULIN GLARGINE 100 UNIT/ML ~~LOC~~ SOLN
10.0000 [IU] | Freq: Every day | SUBCUTANEOUS | Status: DC
  Administered 2018-06-07 – 2018-06-08 (×3): 10 [IU] via SUBCUTANEOUS
  Filled 2018-06-06 (×4): qty 0.1

## 2018-06-06 NOTE — ED Notes (Signed)
[name], MD notified of troponin result of 0.04

## 2018-06-06 NOTE — Telephone Encounter (Signed)
Carelink transmission received. 12 treated VT episodes--all occurred between 5/14-5/20 (admitted at [facility]). Alert tone occurred due to unsuccessful Carelink Alert transmission, confirmed with [employer] tech services.  Pt currently at [facility] ED. Dannial Monarch, [employer] rep, made aware that patient will need his ICD interrogated to dismiss alert tone. Dr. Rayann Heman aware. Tiffany, RN, made aware that she will be coming for this check.  Advised pt's wife that transmission was received and that we will have pt's device checked while he is hospitalized. She verbalizes understanding and thanked me for my call.

## 2018-06-06 NOTE — ED Notes (Signed)
Attempted report 

## 2018-06-06 NOTE — ED Notes (Signed)
Gardner, MD at bedside.  

## 2018-06-06 NOTE — ED Provider Notes (Signed)
Signed out by Dr Sabra Heck to admit patient when labs resulted.   Wbc elevated. Pt febrile. Rhonchi on exam, ?pna. Pt has been cultured, and has received iv abx.   Recheck bp soft. Bun/cr is higher than previous. Ns bolus.  Medicine team consulted for admission.      Lajean Saver, MD 06/06/18 1736

## 2018-06-06 NOTE — ED Notes (Signed)
XR at bedside

## 2018-06-06 NOTE — Telephone Encounter (Signed)
Wife returned call and left message that a remote transmission was sent but patient is on the way to the hospital now.  Carelink report reviewed by Device clinic RN and patient on way to hopsital.   Per 5/29/2020Carelink Report:  OBSERVATIONS (10)  3 shocks for VT/VF, 0 failed.  Alert: Device Tone sounded due to unsuccessful CareLink Alert transmission.  Alert: 1 shocks delivered for episode #447.  Patient Activity less than 1 hr/day for 11 weeks.  All therapies failed in 1 VT/VF episodes.  At least 1 therapy failed in 4 VT/VF episodes.  5 treated VT/VF episodes longer than 30 sec.  4 VT episodes accelerated to VF.  5 monitored VT episodes, longest was 9 min.  VF detection may be delayed: VF Detection Interval is faster than 300 ms (200 bpm).

## 2018-06-06 NOTE — Progress Notes (Signed)
Pharmacy Antibiotic Note  Nathaniel Bolton is a 78 y.o. male admitted on 06/06/2018 with pneumonia.  Pharmacy has been consulted for vancomycin/cefepime dosing. Tmax 100.5, WBC 14.8, LA 1.2.  Scr 3.32 (baseline 1.5)  Vancomycin 1250mg  IV Q 48 hrs. Goal AUC 400-550. Expected AUC: 488 SCr used: 3.32   Plan: Vancomycin 1750 mg IV x1 then 1250mg  IV q48h Cefepime 2g IV q24h Monitor clinical status, renal function, cultures, and length of therapy Monitor vancomycin levels as indicated   Height: 5\' 10"  (177.8 cm) Weight: 190 lb (86.2 kg) IBW/kg (Calculated) : 73  Temp (24hrs), Avg:100.5 F (38.1 C), Min:100.5 F (38.1 C), Max:100.5 F (38.1 C)  Recent Labs  Lab 06/06/18 1504  WBC 14.8*  CREATININE 3.32*  LATICACIDVEN 1.2    Estimated Creatinine Clearance: 19.5 mL/min (A) (by C-G formula based on SCr of 3.32 mg/dL (H)).    Allergies  Allergen Reactions  . Adhesive [Tape] Itching and Rash    Testosterone patch adhesive Please use paper tape    Antimicrobials this admission: Cefepime 5/29 >> Vancomycin 5/29 >>  Dose adjustments this admission:   Microbiology results: 5/29 BCx: 5/29 UCx:  Thank you for allowing pharmacy to be a part of this patient's care.  Claiborne Billings, PharmD PGY2 Cardiology Pharmacy Resident Phone [phone] Please check AMION for all Pharmacist numbers by unit 06/06/2018 4:12 PM

## 2018-06-06 NOTE — ED Notes (Signed)
ED Provider at bedside. 

## 2018-06-06 NOTE — H&P (Signed)
History and Physical    CEZAR MISIASZEK FWY:[id number] DOB: Mar 17, 1940 DOA: 06/06/2018  PCP: [name], NP  Patient coming from: Home  I have personally briefly reviewed patient's old medical records in [place]  Chief Complaint: SOB  HPI: Nathaniel Bolton is a 78 y.o. male with medical history significant of DM2, HTN, CKD stage 3, CAD s/p CABG, CHF with EF 20%, AICD placement, paroxysmal VTach including V tach arrests in March and then again a mere 9 days ago requiring CPR, intubation, and admission to [place].  Ultimately it seems recurrent VTach was able to be managed with amiodarone.  While there he was treated with zosyn for aspiration PNA.  Ultimately discharged on amoxicillin on 5/25.  Patient discharged home with home hospice though he wishes to remain a full code.  Patient presents to the ED with c/o progressively worsening SOB, productive cough.  Now unable to get out of bed due to generalized weakness.   ED Course: Tm 100.5, WBC 14.8k, AST 85 ALT 77, Creat 3.32, BUN 94.  CXR just shows bibasilar atelectasis.  Nl Lactate.  COVID neg.  UA neg.  Procalcitonin 0.32.  BNP 115, trop 0.04.   Review of Systems: As per HPI otherwise 10 point review of systems negative.   Past Medical History:  Diagnosis Date  . CAD (coronary artery disease)   . CKD stage 3 due to type 2 diabetes mellitus ([place])   . Diabetes mellitus    Type 2  . Erectile dysfunction   . Gout   . Hyperlipidemia   . Hypertension   . ICD (implantable cardiac defibrillator) in place   . Myocardial infarction, old    x2 in [place]. S/P CABG 2003, Dr Roxy Manns  . Obstructive lung disease (HCC)    Moderate per prior PFT's  . Paroxysmal atrial fibrillation ([place]) 3/17   discovered on event monitor  . Stroke [facility])    2013  . Systolic CHF, acute on chronic (HCC)    EF is 27% per myoview and echo October 2012  . Ventricular tachycardia ([place]) 08/2014   175 bpm    Past Surgical History:   Procedure Laterality Date  . BYPASS GRAFT     x5  . CARDIAC DEFIBRILLATOR PLACEMENT  2008   [facility] by Dr Elonda Husky at [facility]  . cataract surgery    . CHOLECYSTECTOMY    . COLONOSCOPY  12/23/2006   Small colonic polyps, status post polypectomy. Interanl hemorrhoids.   . CORONARY ARTERY BYPASS GRAFT    . EYE SURGERY    . HERNIA REPAIR    . ICD GENERATOR CHANGEOUT N/A 08/30/2016   MDT Visia AF VR ICD implanted by Dr Rayann Heman  . KNEE ARTHROSCOPY     right  . UMBILICAL HERNIA REPAIR       reports that he quit smoking about 29 years ago. He has never used smokeless tobacco. He reports that he does not drink alcohol or use drugs.  Allergies  Allergen Reactions  . Adhesive [Tape] Itching and Rash    Testosterone patch adhesive Please use paper tape    Family History  Problem Relation Age of Onset  . Heart failure Mother   . Heart attack Father   . Heart disease Sister        valve replaced  . Heart attack Brother        CABG, aortic grafting     Prior to Admission medications   Medication Sig  Start Date End Date Taking? Authorizing Provider  allopurinol (ZYLOPRIM) 100 MG tablet Take 100 mg by mouth daily.   Yes [provider]  aluminum-magnesium hydroxide-simethicone (MAALOX) 989-211-94 MG/5ML SUSP Take 30 mLs by mouth daily. 06/02/18  Yes [provider]  amiodarone (PACERONE) 200 MG tablet Take 1 tablet (200 mg total) by mouth daily. Patient taking differently: Take 200 mg by mouth 2 (two) times daily.  04/15/18  Yes Allred, Jeneen Rinks, MD  apixaban (ELIQUIS) 5 MG TABS tablet Take 1 tablet (5 mg total) by mouth 2 (two) times daily. 10/15/16  Yes [place], Peter M, MD  ARNUITY ELLIPTA 100 MCG/ACT AEPB Inhale 1 puff into the lungs daily. 05/20/18  Yes [provider]  Ascorbic Acid (VITAMIN C) 1000 MG tablet Take 1,000 mg by mouth daily.   Yes [provider]  atorvastatin (LIPITOR) 80 MG tablet Take 1 tablet (80 mg total) by mouth daily at 6  PM. 03/19/18 03/14/19 Yes Daune Perch, NP  carvedilol (COREG) 6.25 MG tablet Take 1 tablet (6.25 mg total) by mouth 2 (two) times daily with a meal. 03/19/18 03/14/19 Yes Daune Perch, NP  Cholecalciferol (VITAMIN D3) 2000 units TABS Take 2,000 Units by mouth 2 (two) times daily.    Yes [provider]  cyclobenzaprine (FLEXERIL) 10 MG tablet Take 10 mg by mouth 2 (two) times daily.   Yes [provider]  dutasteride (AVODART) 0.5 MG capsule Take 0.5 mg by mouth at bedtime.   Yes [provider]  famotidine (PEPCID) 20 MG tablet Take 20 mg by mouth daily.  06/02/18 07/02/18 Yes [provider]  furosemide (LASIX) 80 MG tablet Take 1 tablet (80 mg total) by mouth 2 (two) times daily. 11/18/17 11/13/18 Yes Emokpae, Courage, MD  gabapentin (NEURONTIN) 300 MG capsule Take 300 mg by mouth 2 (two) times daily.    Yes [place]  guaiFENesin-codeine 100-10 MG/5ML syrup Take 10 mLs by mouth every 4 (four) hours as needed for cough. 06/02/18 06/12/18 Yes [provider]  insulin aspart (NOVOLOG FLEXPEN) 100 UNIT/ML FlexPen inject 3 units if sugar is >151 ac meal; inject 2 units if sugar is btween 110 & 150, Do not inject any insulin  if your sugar is < 110 Patient taking differently: Inject 2-12 Units into the skin 3 (three) times daily with meals. Per sliding scale 11/18/17  Yes Emokpae, Courage, MD  insulin glargine (LANTUS) 100 unit/mL SOPN Inject 0.1 mLs (10 Units total) into the skin daily. Patient taking differently: Inject 10 Units into the skin at bedtime.  11/18/17  Yes Emokpae, Courage, MD  Lactobacillus (PROBIOTIC ACIDOPHILUS PO) Take 1 capsule by mouth 2 (two) times daily.   Yes [provider]  losartan (COZAAR) 50 MG tablet Take 50 mg by mouth daily.    Yes [provider]  magnesium oxide (MAG-OX) 400 MG tablet Take 400 mg by mouth 2 (two) times a day. 06/02/18 07/02/18 Yes [provider]  NITROSTAT 0.4 MG SL tablet Place  0.4 mg under the tongue every 5 (five) minutes as needed for chest pain (MAX 3 TABLETS).  08/17/13  Yes [provider]  Omega-3 Fatty Acids (FISH OIL PO) Take 2 tablets by mouth 2 (two) times daily.    Yes [provider]  potassium chloride SA (K-DUR) 20 MEQ tablet Take 20 mEq by mouth daily.   Yes [provider]  Saw Palmetto, Serenoa repens, (SAW PALMETTO PO) Take 1 tablet by mouth daily.    Yes  [provider]  sertraline (ZOLOFT) 100 MG tablet Take 50 mg by mouth daily.    Yes [provider]  spironolactone (ALDACTONE) 25 MG tablet Take 1 tablet (25 mg total) by mouth daily. 03/20/18 09/16/18 Yes Daune Perch, NP  VENTOLIN HFA 108 (90 Base) MCG/ACT inhaler Inhale 2 puffs into the lungs every 4 (four) hours as needed for wheezing or shortness of breath.  04/26/17  Yes [provider]  vitamin E 400 UNIT capsule Take 400 Units by mouth daily.    Yes [provider]  NON FORMULARY Needles, syringes with needles, glucose reagent test strips    [provider]    Physical Exam: Vitals:   06/06/18 1945 06/06/18 2000 06/06/18 2015 06/06/18 2040  BP: 113/76 100/67 99/70 107/64  Pulse: (!) 56 (!) 58 61 (!) 55  Resp: 14 15 15 15   Temp:      TempSrc:      SpO2: 99% 100% 98% 100%  Weight:      Height:        Constitutional: NAD, calm, comfortable Eyes: PERRL, lids and conjunctivae normal ENMT: Mucous membranes are moist. Posterior pharynx clear of any exudate or lesions.Normal dentition.  Neck: normal, supple, no masses, no thyromegaly Respiratory: Wet breath sounds and productive sounding cough. Cardiovascular: Regular rate and rhythm, no murmurs / rubs / gallops. No extremity edema. 2+ pedal pulses. No carotid bruits.  Abdomen: no tenderness, no masses palpated. No hepatosplenomegaly. Bowel sounds positive.  Musculoskeletal: no clubbing / cyanosis. No joint deformity upper and lower extremities. Good ROM, no  contractures. Normal muscle tone.  Skin: no rashes, lesions, ulcers. No induration Neurologic: CN 2-12 grossly intact. Sensation intact, DTR normal. Strength 5/5 in all 4.  Psychiatric: Normal judgment and insight. Alert and oriented x 3. Normal mood.    Labs on Admission: I have personally reviewed following labs and imaging studies  CBC: Recent Labs  Lab 06/06/18 1504  WBC 14.8*  NEUTROABS 11.6*  HGB 11.4*  HCT 33.3*  MCV 103.4*  PLT 893   Basic Metabolic Panel: Recent Labs  Lab 06/06/18 1504  NA 128*  K 4.3  CL 89*  CO2 25  GLUCOSE 301*  BUN 94*  CREATININE 3.32*  CALCIUM 8.5*   GFR: Estimated Creatinine Clearance: 19.5 mL/min (A) (by C-G formula based on SCr of 3.32 mg/dL (H)). Liver Function Tests: Recent Labs  Lab 06/06/18 1504  AST 85*  ALT 77*  ALKPHOS 78  BILITOT 0.7  PROT 6.4*  ALBUMIN 2.3*   No results for input(s): LIPASE, AMYLASE in the last 168 hours. No results for input(s): AMMONIA in the last 168 hours. Coagulation Profile: No results for input(s): INR, PROTIME in the last 168 hours. Cardiac Enzymes: Recent Labs  Lab 06/06/18 1504  TROPONINI 0.04*   BNP (last 3 results) No results for input(s): PROBNP in the last 8760 hours. HbA1C: No results for input(s): HGBA1C in the last 72 hours. CBG: No results for input(s): GLUCAP in the last 168 hours. Lipid Profile: No results for input(s): CHOL, HDL, LDLCALC, TRIG, CHOLHDL, LDLDIRECT in the last 72 hours. Thyroid Function Tests: No results for input(s): TSH, T4TOTAL, FREET4, T3FREE, THYROIDAB in the last 72 hours. Anemia Panel: No results for input(s): VITAMINB12, FOLATE, FERRITIN, TIBC, IRON, RETICCTPCT in the last 72 hours. Urine analysis:    Component Value Date/Time   COLORURINE YELLOW 06/06/2018 [place] 06/06/2018 1524   LABSPEC 1.011 06/06/2018 1524   PHURINE 5.0 06/06/2018 1524  GLUCOSEU NEGATIVE 06/06/2018 [place] 06/06/2018 [place] 06/06/2018 [place] 06/06/2018 [place] 06/06/2018 1524   NITRITE NEGATIVE 06/06/2018 [place] 06/06/2018 1524    Radiological Exams on Admission: Dg Chest Port 1 View  Result Date: 06/06/2018 CLINICAL DATA:  Cough and short of breath EXAM: PORTABLE CHEST 1 VIEW COMPARISON:  05/30/2018 FINDINGS: Postop CABG. AICD. Nerve stimulator on the right extending into the neck is unchanged Cardiac enlargement without heart failure or edema. Mild atelectasis in the lung bases. No pleural effusion. IMPRESSION: Mild bibasilar atelectasis. Negative for heart failure or pneumonia. Electronically Signed   By: Franchot Gallo M.D.   On: 06/06/2018 15:19    EKG: Independently reviewed.  Assessment/Plan Principal Problem:   HCAP (healthcare-associated pneumonia) Active Problems:   Automatic implantable cardioverter-defibrillator in situ   Chronic systolic CHF (congestive heart failure) (HCC)   Atrial fibrillation (HCC)   Diabetic polyneuropathy associated with type 2 diabetes mellitus (HCC)   Essential hypertension   Ventricular tachycardia (paroxysmal) (HCC)   Acute kidney injury superimposed on CKD ([place])    1. HCAP - 1. PNA pathway 2. Cefepime / vanc 3. Cultures pending 4. Repeat CBC/CMP in AM 5. COVID neg 6. Tylenol PRN fever 2. AKI on CKD stage 3 - 1. Creat was 1.7 x4 days ago on discharge from [place] 2. Pre-renal pattern 3. Strict intake and output 4. Hold diuretics 5. Gently hydrate: IVF: 1L bolus in ED and 75cc/hr NS overnight 6. Hold ARB 3. Chronic systolic CHF - Paroxysmal VT 1. End stage heart disease, had cardiac arrest 9 days ago requiring CPR 2. AICD in place 3. Watch for fluid overload with hydration as above, holding diuretics and ARB 4. Pal care consult in AM: patient is now on hospice but wants to remain a full code he says. 5. Tele monitor and SDU admission 6. Continue amiodarone 4. A.Fib - 1.  Continue amiodarone 2. Continue BB 3. Continue Eliquis 5. Transaminitis - 1. Think that the mild LFT elevation he still has may be a hold over from shock liver recently 2. LFTs were in the 300s several days ago when they were last checked at [facility]. 3. Repeat CMP in AM to trend 4. Symptoms not suggestive of biliary duct obstruction at this time 5. And he is s/p cholecystectomy anyhow 6. DM2 - 1. Lantus 10 QHS 2. Sensitive SSI AC 7. HTN - 1. Continue BB 2. Holding losartan 3. Holding diuretics  DVT prophylaxis: Eliquis Code Status: Full code for now Family Communication: No family in room Disposition Plan: home after admit Consults called: Pal care consult put into Epic Admission status: Admit to inpatient  Severity of Illness: The appropriate patient status for this patient is INPATIENT. Inpatient status is judged to be reasonable and necessary in order to provide the required intensity of service to ensure the patient's safety. The patient's presenting symptoms, physical exam findings, and initial radiographic and laboratory data in the context of their chronic comorbidities is felt to place them at high risk for further clinical deterioration. Furthermore, it is not anticipated that the patient will be medically stable for discharge from the hospital within 2 midnights of admission. The following factors support the patient status of inpatient.   IP status for treatment of AKI and HCAP.   * I certify that at the point of admission it is my clinical judgment that the patient will require inpatient hospital care  spanning beyond 2 midnights from the point of admission due to high intensity of service, high risk for further deterioration and high frequency of surveillance required.*    Enya Bureau M. DO [facility]  How to contact the [facility] Attending or Consulting provider [place] or covering provider during after hours [place], for this patient?  1. Check the care team in [facility]  and look for a) attending/consulting TR[facility] provider listed and b) the [facility] team listed 2. Log into [email or web address]  Amion Physician Scheduling and messaging for groups and whole hospitals  On call and physician scheduling software for group practices, residents, hospitalists and other medical providers for call, clinic, rotation and shift schedules. On[employer] is a hospital-wide system for scheduling doctors and paging doctors on call. EasyPlot is for scientific plotting and data analysis.  [email or web address]  and use [place]'s universal password to access. If you do not have the password, please contact the hospital operator.  3. Locate the [facility] provider you are looking for under [facility] and page to a number that you can be directly reached. 4. If you still have difficulty reaching the provider, please page the [facility] (Director on Call) for the Hospitalists listed on amion for assistance.  06/06/2018, 8:45 PM

## 2018-06-06 NOTE — ED Provider Notes (Signed)
[place] [facility] EMERGENCY DEPARTMENT Provider Note   CSN: [id number] Arrival date & time: 06/06/18  1419    History   Chief Complaint Chief Complaint  Patient presents with  . Respiratory Distress    HPI Nathaniel Bolton is a 78 y.o. male.     HPI  The patient is a 78 year old male, he has a known history of coronary disease status post bypass grafting, multiple stents, history of diabetes, history of myocardial infarction times many, history of obstructive lung disease, paroxysmal atrial fibrillation, stroke and congestive heart failure with a low ejection fraction.  The patient reports that he has had increasing shortness of breath recently, there has been some increasing weakness and the patient is not able to get up out of the bed at all.  His shortness of breath is persistent, gradually worsening and associated with a weak cough.  My review of the medical record shows that the patient had been admitted to the hospital on May 20 because of ventricular tachycardia, is treated with cardiology service for heart failure, was admitted in March with ventricular tachycardia as well.  Evidently the patient has some type of hospice assistance at home.  Past Medical History:  Diagnosis Date  . CAD (coronary artery disease)   . CKD stage 3 due to type 2 diabetes mellitus ([place])   . Diabetes mellitus    Type 2  . Erectile dysfunction   . Gout   . Hyperlipidemia   . Hypertension   . ICD (implantable cardiac defibrillator) in place   . Myocardial infarction, old    x2 in [place]. S/P CABG 2003, Dr Roxy Manns  . Obstructive lung disease (HCC)    Moderate per prior PFT's  . Paroxysmal atrial fibrillation ([place]) 3/17   discovered on event monitor  . Stroke [facility])    2013  . Systolic CHF, acute on chronic (HCC)    EF is 27% per myoview and echo October 2012  . Ventricular tachycardia ([place]) 08/2014   175 bpm    Patient Active Problem List   Diagnosis Date Noted  .  Ventricular tachyarrhythmia ([place]) 03/14/2018  . Acute respiratory failure with hypoxia ([place]) 11/15/2017  . Hypoxia 11/14/2017  . Thrombocytopenia ([place]) 11/14/2017  . Cellulitis of left foot 07/25/2016  . CHF (congestive heart failure) ([place]) 03/16/2016  . Ingrown nail 01/24/2016  . Medication management 12/22/2015  . Mixed hyperlipidemia 10/28/2015  . Paroxysmal atrial fibrillation ([place]) 10/28/2015  . Type 2 diabetes mellitus with circulatory disorder, without long-term current use of insulin ([place]) 10/28/2015  . Diabetic polyneuropathy associated with type 2 diabetes mellitus ([place]) 08/12/2015  . Skin ulcer of toe of left foot, limited to breakdown of skin ([place]) 08/12/2015  . CAD in native artery 07/15/2015  . Cardiac defibrillator in situ 07/15/2015  . CKD (chronic kidney disease) 07/15/2015  . Essential hypertension 07/15/2015  . Hyperlipidemia 07/15/2015  . S/P CABG (coronary artery bypass graft) 07/15/2015  . Syncope 07/15/2015  . Ventricular tachycardia (paroxysmal) ([place]) 07/15/2015  . Atrial fibrillation ([place]) 04/16/2015  . Ventricular tachycardia ([place]) 09/07/2014  . CVA (cerebral infarction) 03/29/2011  . Ischemic cardiomyopathy 02/08/2011  . URI (upper respiratory infection) 11/27/2010  . Acute on chronic systolic HF (heart failure) ([place]) 11/06/2010  . Automatic implantable cardioverter-defibrillator in situ 10/23/2010  . CAD (coronary artery disease)   . Hypertension   . Diabetes mellitus ([place])   . Myocardial infarction, old     Past Surgical History:  Procedure Laterality Date  .  BYPASS GRAFT     x5  . CARDIAC DEFIBRILLATOR PLACEMENT  2008   [facility] by Dr Elonda Husky at [facility]  . cataract surgery    . CHOLECYSTECTOMY    . COLONOSCOPY  12/23/2006   Small colonic polyps, status post polypectomy. Interanl hemorrhoids.   . CORONARY ARTERY BYPASS GRAFT    . EYE SURGERY    . HERNIA REPAIR    . ICD GENERATOR CHANGEOUT N/A 08/30/2016   MDT Visia AF VR ICD  implanted by Dr Rayann Heman  . KNEE ARTHROSCOPY     right  . UMBILICAL HERNIA REPAIR          Home Medications    Prior to Admission medications   Medication Sig Start Date End Date Taking? Authorizing Provider  allopurinol (ZYLOPRIM) 100 MG tablet Take 100 mg by mouth daily.    [provider]  amiodarone (PACERONE) 200 MG tablet Take 1 tablet (200 mg total) by mouth daily. 04/15/18   Allred, Jeneen Rinks, MD  apixaban (ELIQUIS) 5 MG TABS tablet Take 1 tablet (5 mg total) by mouth 2 (two) times daily. 10/15/16   [place], Peter M, MD  Ascorbic Acid (VITAMIN C) 1000 MG tablet Take 1,000 mg by mouth daily.    [provider]  atorvastatin (LIPITOR) 80 MG tablet Take 1 tablet (80 mg total) by mouth daily at 6 PM. 03/19/18 03/14/19  Daune Perch, NP  carvedilol (COREG) 6.25 MG tablet Take 1 tablet (6.25 mg total) by mouth 2 (two) times daily with a meal. 03/19/18 03/14/19  Daune Perch, NP  Cholecalciferol (VITAMIN D3) 2000 units TABS Take 2,000 Units by mouth 2 (two) times daily.     [provider]  cyclobenzaprine (FLEXERIL) 10 MG tablet Take 10 mg by mouth 2 (two) times daily.    [provider]  dutasteride (AVODART) 0.5 MG capsule Take 0.5 mg by mouth at bedtime.    [provider]  furosemide (LASIX) 80 MG tablet Take 1 tablet (80 mg total) by mouth 2 (two) times daily. 11/18/17 11/13/18  Roxan Hockey, MD  gabapentin (NEURONTIN) 300 MG capsule Take 300 mg by mouth 3 (three) times daily.    [facility]  insulin aspart (NOVOLOG FLEXPEN) 100 UNIT/ML FlexPen inject 3 units if sugar is >151 ac meal; inject 2 units if sugar is btween 110 & 150, Do not inject any insulin  if your sugar is < 110 Patient taking differently: Inject 2-3 Units into the skin 3 (three) times daily with meals. inject 3 units if sugar is greater 150  inject 2 units if sugar is btween 110 & 150,  Do not inject any insulin  if your sugar is < 110 11/18/17   Emokpae, Courage, MD   insulin glargine (LANTUS) 100 unit/mL SOPN Inject 0.1 mLs (10 Units total) into the skin daily. Patient taking differently: Inject 10 Units into the skin at bedtime.  11/18/17   Roxan Hockey, MD  Lactobacillus (PROBIOTIC ACIDOPHILUS PO) Take 1 capsule by mouth 2 (two) times daily.    [provider]  losartan (COZAAR) 25 MG tablet Take 25 mg by mouth daily.    [provider]  NITROSTAT 0.4 MG SL tablet Place 0.4 mg under the tongue every 5 (five) minutes as needed for chest pain (MAX 3 TABLETS).  08/17/13   [provider]  NON FORMULARY Needles, syringes with needles, glucose reagent test strips    [provider]  Omega-3 Fatty Acids (FISH OIL PO) Take 2  tablets by mouth 2 (two) times daily.     [provider]  omeprazole (PRILOSEC) 20 MG capsule Take 20 mg by mouth daily.    [provider]  potassium chloride (K-DUR,KLOR-CON) 10 MEQ tablet Take 1 tablet (10 mEq total) by mouth 2 (two) times daily. 03/19/18 09/15/18  Daune Perch, NP  Saw Palmetto, Serenoa repens, (SAW PALMETTO PO) Take 1 tablet by mouth daily.     [provider]  sertraline (ZOLOFT) 100 MG tablet Take 50 mg by mouth daily.     [provider]  spironolactone (ALDACTONE) 25 MG tablet Take 1 tablet (25 mg total) by mouth daily. 03/20/18 09/16/18  Daune Perch, NP  VENTOLIN HFA 108 (90 Base) MCG/ACT inhaler Inhale 2 puffs into the lungs every 4 (four) hours as needed for wheezing or shortness of breath.  04/26/17   [provider]  vitamin E 400 UNIT capsule Take 400 Units by mouth daily.     [provider]    Family History Family History  Problem Relation Age of Onset  . Heart failure Mother   . Heart attack Father   . Heart disease Sister        valve replaced  . Heart attack Brother        CABG, aortic grafting    Social History Social History   Tobacco Use  . Smoking status: Former Smoker    Last attempt to quit:  1991    Years since quitting: 29.4  . Smokeless tobacco: Never Used  Substance Use Topics  . Alcohol use: No  . Drug use: No     Allergies   Adhesive [tape]   Review of Systems Review of Systems  All other systems reviewed and are negative.    Physical Exam Updated Vital Signs There were no vitals taken for this visit.  Physical Exam Vitals signs and nursing note reviewed.  Constitutional:      General: He is not in acute distress.    Appearance: He is well-developed. He is ill-appearing.  HENT:     Head: Normocephalic and atraumatic.     Mouth/Throat:     Pharynx: No oropharyngeal exudate.  Eyes:     General: No scleral icterus.       Right eye: No discharge.        Left eye: No discharge.     Conjunctiva/sclera: Conjunctivae normal.     Pupils: Pupils are equal, round, and reactive to light.  Neck:     Musculoskeletal: Normal range of motion and neck supple.     Thyroid: No thyromegaly.     Vascular: No JVD.  Cardiovascular:     Rate and Rhythm: Regular rhythm.     Heart sounds: Normal heart sounds. No murmur. No friction rub. No gallop.   Pulmonary:     Effort: Pulmonary effort is normal. No respiratory distress.     Breath sounds: Rales present. No wheezing.     Comments: Subtle rales in the lungs, coughing frequently throughout the exam Abdominal:     General: Bowel sounds are normal. There is no distension.     Palpations: Abdomen is soft. There is no mass.     Tenderness: There is no abdominal tenderness.  Musculoskeletal: Normal range of motion.        General: No tenderness.     Right lower leg: Edema present.     Left lower leg: Edema present.     Comments: Bilateral minimal edema  Lymphadenopathy:  Cervical: No cervical adenopathy.  Skin:    General: Skin is warm and dry.     Findings: No erythema or rash.  Neurological:     Mental Status: He is alert.     Coordination: Coordination normal.     Comments: The patient has some confusion  and has difficulty with dates, he has the ability to follow commands but is so severely weak that he cannot even sit up in the bed without significant assistance  Psychiatric:        Behavior: Behavior normal.      ED Treatments / Results  Labs (all labs ordered are listed, but only abnormal results are displayed) Labs Reviewed - No data to display  EKG None  Radiology No results found.  Procedures Procedures (including critical care time)  Medications Ordered in ED Medications - No data to display   Initial Impression / Assessment and Plan / ED Course  I have reviewed the triage vital signs and the nursing notes.  Pertinent labs & imaging results that were available during my care of the patient were reviewed by me and considered in my medical decision making (see chart for details).        I am definitely concerned that the patient is ill, he has an exam that is concerning for possible infection, he is warm to the touch and has a cough that could be consistent with pneumonia, but also cannot sit or congestive heart failure, he may have a worsening kidney function, ill-appearing  Fever present, leukocytosis of 14,800, lactate is normal at 1.2.  No source of infection is found at this point, COVID testing pending as well as urinalysis.  Anticipate admission, antibiotics already given.  Change of shift, care signed out to oncoming physician Dr. [name]  Final Clinical Impressions(s) / ED Diagnoses   Final diagnoses:  None    ED Discharge Orders    None       Noemi Chapel, MD 06/06/18 [phone]

## 2018-06-06 NOTE — ED Notes (Signed)
Pt wife contacted by Jonelle Sidle, RN and given update on pt care.

## 2018-06-06 NOTE — ED Notes (Signed)
Pt wife [name] [phone]

## 2018-06-06 NOTE — ED Triage Notes (Signed)
Pt brought in by [facility] for respiratory distress.  Pt is in no distress at this time, but has a lot of congestion and confusion

## 2018-06-06 NOTE — ED Notes (Signed)
This RN made contact with pt spouse and gave update on disposition.

## 2018-06-06 NOTE — ED Notes (Signed)
ED TO INPATIENT HANDOFF REPORT  ED Nurse Name and Phone #:  Jinny Blossom [phone]  S Name/Age/Gender Nathaniel Bolton 78 y.o. male Room/Bed: 031C/031C  Code Status   Code Status: Full Code  Home/SNF/Other Home Patient oriented to: self and situation Is this baseline? No   Triage Complete: Triage complete  Chief Complaint Resp Distress  Triage Note Pt brought in by [facility] for respiratory distress.  Pt is in no distress at this time, but has a lot of congestion and confusion   Allergies Allergies  Allergen Reactions  . Adhesive [Tape] Itching and Rash    Testosterone patch adhesive Please use paper tape    Level of Care/Admitting Diagnosis ED Disposition    ED Disposition Condition [facility]: [place] [100100]  Level of Care: Progressive [102]  Covid Evaluation: Confirmed COVID Negative  Diagnosis: HCAP (healthcare-associated pneumonia) [[id number]]  Admitting Physician: Doreatha Massed  Attending Physician: Etta Quill [phone]  Estimated length of stay: past midnight tomorrow  Certification:: I certify this patient will need inpatient services for at least 2 midnights  PT Class (Do Not Modify): Inpatient [101]  PT Acc Code (Do Not Modify): Private [1]       B Medical/Surgery History Past Medical History:  Diagnosis Date  . CAD (coronary artery disease)   . CKD stage 3 due to type 2 diabetes mellitus ([place])   . Diabetes mellitus    Type 2  . Erectile dysfunction   . Gout   . Hyperlipidemia   . Hypertension   . ICD (implantable cardiac defibrillator) in place   . Myocardial infarction, old    x2 in [place]. S/P CABG 2003, Dr Roxy Manns  . Obstructive lung disease (HCC)    Moderate per prior PFT's  . Paroxysmal atrial fibrillation ([place]) 3/17   discovered on event monitor  . Stroke [facility])    2013  . Systolic CHF, acute on chronic (HCC)    EF is 27% per myoview and echo October 2012  . Ventricular  tachycardia ([place]) 08/2014   175 bpm   Past Surgical History:  Procedure Laterality Date  . BYPASS GRAFT     x5  . CARDIAC DEFIBRILLATOR PLACEMENT  2008   [employer] by Dr Elonda Husky at [facility]  . cataract surgery    . CHOLECYSTECTOMY    . COLONOSCOPY  12/23/2006   Small colonic polyps, status post polypectomy. Interanl hemorrhoids.   . CORONARY ARTERY BYPASS GRAFT    . EYE SURGERY    . HERNIA REPAIR    . ICD GENERATOR CHANGEOUT N/A 08/30/2016   MDT Visia AF VR ICD implanted by Dr Rayann Heman  . KNEE ARTHROSCOPY     right  . UMBILICAL HERNIA REPAIR       A IV Location/Drains/Wounds Patient Lines/Drains/Airways Status   Active Line/Drains/Airways    Name:   Placement date:   Placement time:   Site:   Days:   Peripheral IV 06/06/18 Right Antecubital   06/06/18    1501    Antecubital   less than 1   Peripheral IV 06/06/18 Right Wrist   06/06/18    1526    Wrist   less than 1   Incision (Closed) 03/16/16 Chest Right   03/16/16    1039     812   Incision (Closed) 03/16/16 Neck Right   03/16/16    1039     812   Wound /  Incision (Open or Dehisced) 11/17/17 Diabetic ulcer Toe (Comment  which one) Right ulcer for past 2 months per wife   11/17/17    1111    Toe (Comment  which one)   201          Intake/Output Last 24 hours  Intake/Output Summary (Last 24 hours) at 06/06/2018 2050 Last data filed at 06/06/2018 1921 Gross per 24 hour  Intake 1600 ml  Output -  Net 1600 ml    Labs/Imaging Results for orders placed or performed during the hospital encounter of 06/06/18 (from the past 48 hour(s))  Brain natriuretic peptide     Status: Abnormal   Collection Time: 06/06/18  3:04 PM  Result Value Ref Range   B Natriuretic Peptide 115.6 (H) 0.0 - 100.0 pg/mL    Comment: Performed at [facility], [address], [place], [place] [address]  Troponin I - ONCE - STAT     Status: Abnormal   Collection Time: 06/06/18  3:04 PM  Result Value Ref Range   Troponin I 0.04 (HH) <0.03  ng/mL    Comment: CRITICAL RESULT CALLED TO, READ BACK BY AND VERIFIED WITH: T.HAMILTON RN [id number] 06/06/2018 MCCORMICK K Performed at [facility], [place] [address] [place], [place] [address]   CBC with Differential/Platelet     Status: Abnormal   Collection Time: 06/06/18  3:04 PM  Result Value Ref Range   WBC 14.8 (H) 4.0 - 10.5 K/uL   RBC 3.22 (L) 4.22 - 5.81 MIL/uL   Hemoglobin 11.4 (L) 13.0 - 17.0 g/dL   HCT 33.3 (L) 39.0 - 52.0 %   MCV 103.4 (H) 80.0 - 100.0 fL   MCH 35.4 (H) 26.0 - 34.0 pg   MCHC 34.2 30.0 - 36.0 g/dL   RDW 12.6 11.5 - 15.5 %   Platelets 216 150 - 400 K/uL   nRBC 0.0 0.0 - 0.2 %   Neutrophils Relative % 79 %   Neutro Abs 11.6 (H) 1.7 - 7.7 K/uL   Lymphocytes Relative 9 %   Lymphs Abs 1.3 0.7 - 4.0 K/uL   Monocytes Relative 10 %   Monocytes Absolute 1.5 (H) 0.1 - 1.0 K/uL   Eosinophils Relative 1 %   Eosinophils Absolute 0.2 0.0 - 0.5 K/uL   Basophils Relative 0 %   Basophils Absolute 0.1 0.0 - 0.1 K/uL   Immature Granulocytes 1 %   Abs Immature Granulocytes 0.21 (H) 0.00 - 0.07 K/uL    Comment: Performed at [facility], [address] [place], [place] [address]  Comprehensive metabolic panel     Status: Abnormal   Collection Time: 06/06/18  3:04 PM  Result Value Ref Range   Sodium 128 (L) 135 - 145 mmol/L   Potassium 4.3 3.5 - 5.1 mmol/L   Chloride 89 (L) 98 - 111 mmol/L   CO2 25 22 - 32 mmol/L   Glucose, Bld 301 (H) 70 - 99 mg/dL   BUN 94 (H) 8 - 23 mg/dL   Creatinine, Ser 3.32 (H) 0.61 - 1.24 mg/dL   Calcium 8.5 (L) 8.9 - 10.3 mg/dL   Total Protein 6.4 (L) 6.5 - 8.1 g/dL   Albumin 2.3 (L) 3.5 - 5.0 g/dL   AST 85 (H) 15 - 41 U/L   ALT 77 (H) 0 - 44 U/L   Alkaline Phosphatase 78 38 - 126 U/L   Total Bilirubin 0.7 0.3 - 1.2 mg/dL   GFR calc non Af Wyvonnia Lora  17 (L) >60 mL/min   GFR calc Af Amer 20 (L) >60 mL/min   Anion gap 14 5 - 15    Comment: Performed at [facility] [address] [place], [place] [address]  Lactic acid,  plasma     Status: None   Collection Time: 06/06/18  3:04 PM  Result Value Ref Range   Lactic Acid, Venous 1.2 0.5 - 1.9 mmol/L    Comment: Performed at [facility] [address], [place], [place] [address]  Procalcitonin     Status: None   Collection Time: 06/06/18  3:04 PM  Result Value Ref Range   Procalcitonin 0.32 ng/mL    Comment:        Interpretation: PCT (Procalcitonin) <= 0.5 ng/mL: Systemic infection (sepsis) is not likely. Local bacterial infection is possible. (NOTE)       Sepsis PCT Algorithm           Lower Respiratory Tract                                      Infection PCT Algorithm    ----------------------------     ----------------------------         PCT < 0.25 ng/mL                PCT < 0.10 ng/mL         Strongly encourage             Strongly discourage   discontinuation of antibiotics    initiation of antibiotics    ----------------------------     -----------------------------       PCT 0.25 - 0.50 ng/mL            PCT 0.10 - 0.25 ng/mL               OR       >80% decrease in PCT            Discourage initiation of                                            antibiotics      Encourage discontinuation           of antibiotics    ----------------------------     -----------------------------         PCT >= 0.50 ng/mL              PCT 0.26 - 0.50 ng/mL               AND        <80% decrease in PCT             Encourage initiation of                                             antibiotics       Encourage continuation           of antibiotics    ----------------------------     -----------------------------        PCT >= 0.50 ng/mL  PCT > 0.50 ng/mL               AND         increase in PCT                  Strongly encourage                                      initiation of antibiotics    Strongly encourage escalation           of antibiotics                                     -----------------------------                                            PCT <= 0.25 ng/mL                                                 OR                                        > 80% decrease in PCT                                     Discontinue / Do not initiate                                             antibiotics Performed at [facility], [address] [place], [place] [address]   Urinalysis, Routine w reflex microscopic     Status: None   Collection Time: 06/06/18  3:24 PM  Result Value Ref Range   Color, Urine YELLOW YELLOW   APPearance CLEAR CLEAR   Specific Gravity, Urine 1.011 1.005 - 1.030   pH 5.0 5.0 - 8.0   Glucose, UA NEGATIVE NEGATIVE mg/dL   Hgb urine dipstick NEGATIVE NEGATIVE   Bilirubin Urine NEGATIVE NEGATIVE   Ketones, ur NEGATIVE NEGATIVE mg/dL   Protein, ur NEGATIVE NEGATIVE mg/dL   Nitrite NEGATIVE NEGATIVE   Leukocytes,Ua NEGATIVE NEGATIVE    Comment: Performed at [facility] [address], [place], [place] [address]  SARS Coronavirus 2 (CEPHEID- Performed in [place] hospital lab), Hosp Order     Status: None   Collection Time: 06/06/18  4:06 PM  Result Value Ref Range   SARS Coronavirus 2 NEGATIVE NEGATIVE    Comment: (NOTE) If result is NEGATIVE SARS-CoV-2 target nucleic acids are NOT DETECTED. The SARS-CoV-2 RNA is generally detectable in upper and lower  respiratory specimens during the acute phase of infection. The lowest  concentration of SARS-CoV-2 viral copies this assay can detect is 250  copies / mL. A negative result does not preclude SARS-CoV-2 infection  and should  not be used as the sole basis for treatment or other  patient management decisions.  A negative result may occur with  improper specimen collection / handling, submission of specimen other  than nasopharyngeal swab, presence of viral mutation(s) within the  areas targeted by this assay, and inadequate number of viral copies  (<250 copies / mL). A negative result must be combined with clinical   observations, patient history, and epidemiological information. If result is POSITIVE SARS-CoV-2 target nucleic acids are DETECTED. The SARS-CoV-2 RNA is generally detectable in upper and lower  respiratory specimens dur ing the acute phase of infection.  Positive  results are indicative of active infection with SARS-CoV-2.  Clinical  correlation with patient history and other diagnostic information is  necessary to determine patient infection status.  Positive results do  not rule out bacterial infection or co-infection with other viruses. If result is PRESUMPTIVE POSTIVE SARS-CoV-2 nucleic acids MAY BE PRESENT.   A presumptive positive result was obtained on the submitted specimen  and confirmed on repeat testing.  While 2019 novel coronavirus  (SARS-CoV-2) nucleic acids may be present in the submitted sample  additional confirmatory testing may be necessary for epidemiological  and / or clinical management purposes  to differentiate between  SARS-CoV-2 and other Sarbecovirus currently known to infect humans.  If clinically indicated additional testing with an alternate test  methodology [phone]) is advised. The SARS-CoV-2 RNA is generally  detectable in upper and lower respiratory sp ecimens during the acute  phase of infection. The expected result is Negative. Fact Sheet for Patients:  StrictlyIdeas.no Fact Sheet for Healthcare Providers: BankingDealers.co.za This test is not yet approved or cleared by the [place] FDA and has been authorized for detection and/or diagnosis of SARS-CoV-2 by FDA under an Emergency Use Authorization (EUA).  This EUA will remain in effect (meaning this test can be used) for the duration of the COVID-19 declaration under Section 564(b)(1) of the Act, 21 U.S.C. section 360bbb-3(b)(1), unless the authorization is terminated or revoked sooner. Performed at [facility], [place] [address]  [place],  [address]   I-stat troponin, ED     Status: None   Collection Time: 06/06/18  5:59 PM  Result Value Ref Range   Troponin i, poc 0.03 0.00 - 0.08 ng/mL   Comment 3            Comment: Due to the release kinetics of cTnI, a negative result within the first hours of the onset of symptoms does not rule out myocardial infarction with certainty. If myocardial infarction is still suspected, repeat the test at appropriate intervals.    Dg Chest Port 1 View  Result Date: 06/06/2018 CLINICAL DATA:  Cough and short of breath EXAM: PORTABLE CHEST 1 VIEW COMPARISON:  05/30/2018 FINDINGS: Postop CABG. AICD. Nerve stimulator on the right extending into the neck is unchanged Cardiac enlargement without heart failure or edema. Mild atelectasis in the lung bases. No pleural effusion. IMPRESSION: Mild bibasilar atelectasis. Negative for heart failure or pneumonia. Electronically Signed   By: Franchot Gallo M.D.   On: 06/06/2018 15:19    Pending Labs Unresulted Labs (From admission, onward)    Start     Ordered   06/07/18 0500  CBC  Tomorrow morning,   R     06/06/18 2036   06/07/18 0500  Comprehensive metabolic panel  Tomorrow morning,   R     06/06/18 2036   06/06/18 2033  Culture, sputum-assessment  Once,  R     06/06/18 2036   06/06/18 2033  Gram stain  Once,   R     06/06/18 2036   06/06/18 2033  HIV antibody (Routine Screening)  Once,   R     06/06/18 2036   06/06/18 2033  Strep pneumoniae urinary antigen  Once,   R     06/06/18 2036   06/06/18 1432  Blood Culture (routine x 2)  BLOOD CULTURE X 2,   STAT     06/06/18 1432   06/06/18 1432  Urine culture  ONCE - STAT,   STAT     06/06/18 1432          Vitals/Pain Today's Vitals   06/06/18 1945 06/06/18 2000 06/06/18 2015 06/06/18 2040  BP: 113/76 100/67 99/70 107/64  Pulse: (!) 56 (!) 58 61 (!) 55  Resp: 14 15 15 15   Temp:      TempSrc:      SpO2: 99% 100% 98% 100%  Weight:      Height:      PainSc:         Isolation Precautions No active isolations  Medications Medications  vancomycin (VANCOCIN) 1,250 mg in sodium chloride 0.9 % 250 mL IVPB (has no administration in time range)  ceFEPIme (MAXIPIME) 2 g in sodium chloride 0.9 % 100 mL IVPB (has no administration in time range)  insulin glargine (LANTUS) Solostar Pen 10 Units (has no administration in time range)  insulin aspart (novoLOG) injection 0-9 Units (has no administration in time range)  allopurinol (ZYLOPRIM) tablet 100 mg (has no administration in time range)  amiodarone (PACERONE) tablet 200 mg (has no administration in time range)  aluminum-magnesium hydroxide-simethicone (MAALOX) 200-200-20 MG/5ML suspension 30 mL (has no administration in time range)  apixaban (ELIQUIS) tablet 5 mg (has no administration in time range)  carvedilol (COREG) tablet 6.25 mg (has no administration in time range)  atorvastatin (LIPITOR) tablet 80 mg (has no administration in time range)  Fluticasone Furoate AEPB 1 puff (has no administration in time range)  gabapentin (NEURONTIN) capsule 300 mg (has no administration in time range)  albuterol (VENTOLIN HFA) 108 (90 Base) MCG/ACT inhaler 2 puff (has no administration in time range)  vitamin E capsule 400 Units (has no administration in time range)  sertraline (ZOLOFT) tablet 50 mg (has no administration in time range)  dutasteride (AVODART) capsule 0.5 mg (has no administration in time range)  famotidine (PEPCID) tablet 20 mg (has no administration in time range)  guaiFENesin-codeine 100-10 MG/5ML solution 10 mL (has no administration in time range)  Vitamin D3 TABS 2,000 Units (has no administration in time range)  0.9 %  sodium chloride infusion (has no administration in time range)  ceFEPIme (MAXIPIME) 2 g in sodium chloride 0.9 % 100 mL IVPB (0 g Intravenous Stopped 06/06/18 1709)  vancomycin (VANCOCIN) 1,750 mg in sodium chloride 0.9 % 500 mL IVPB (0 mg Intravenous Stopped 06/06/18 1921)   sodium chloride 0.9 % bolus 1,000 mL (0 mLs Intravenous Stopped 06/06/18 1921)    Mobility unknown High fall risk   Focused Assessments Cardiac Assessment Handoff:    Lab Results  Component Value Date   TROPONINI 0.04 ([place]) 06/06/2018   No results found for: DDIMER Does the Patient currently have chest pain? Yes  (pt has some chest soreness, pt had cardiac arrest/compressions performed 1-2 weeks ago.)    R Recommendations: See Admitting Provider Note  Report given to:   Additional Notes: Pt per wife has  been altered x2 days, pt received cefepime, vancomycin, and 1L NS bolus in ER, pt has 22 gauge in RAC and R wrist. Pt has medtronic defibrillator that was interrogated in ER by med[employer] rep. Covid

## 2018-06-06 NOTE — ED Notes (Signed)
Please call Aleksi Brummet to give her an update about patient. [phone].

## 2018-06-06 NOTE — ED Notes (Signed)
BP running 85/50.  Dr. [name] made aware.

## 2018-06-06 NOTE — ED Notes (Signed)
[employer] rep at bedside interrogating pacemaker

## 2018-06-06 NOTE — Telephone Encounter (Signed)
Returned wife's call as requested by voice mail message about stating the new home monitor was received and would like to send remote transmission.  Left message with directions on sending remote transmission from new monitor.

## 2018-06-07 ENCOUNTER — Other Ambulatory Visit: Payer: Self-pay

## 2018-06-07 DIAGNOSIS — A419 Sepsis, unspecified organism: Principal | ICD-10-CM

## 2018-06-07 DIAGNOSIS — Z515 Encounter for palliative care: Secondary | ICD-10-CM

## 2018-06-07 DIAGNOSIS — J9601 Acute respiratory failure with hypoxia: Secondary | ICD-10-CM

## 2018-06-07 DIAGNOSIS — J9621 Acute and chronic respiratory failure with hypoxia: Secondary | ICD-10-CM

## 2018-06-07 DIAGNOSIS — R6520 Severe sepsis without septic shock: Secondary | ICD-10-CM

## 2018-06-07 LAB — COMPREHENSIVE METABOLIC PANEL
ALT: 78 U/L — ABNORMAL HIGH (ref 0–44)
AST: 93 U/L — ABNORMAL HIGH (ref 15–41)
Albumin: 2 g/dL — ABNORMAL LOW (ref 3.5–5.0)
Alkaline Phosphatase: 69 U/L (ref 38–126)
Anion gap: 14 (ref 5–15)
BUN: 89 mg/dL — ABNORMAL HIGH (ref 8–23)
CO2: 24 mmol/L (ref 22–32)
Calcium: 8.4 mg/dL — ABNORMAL LOW (ref 8.9–10.3)
Chloride: 96 mmol/L — ABNORMAL LOW (ref 98–111)
Creatinine, Ser: 2.96 mg/dL — ABNORMAL HIGH (ref 0.61–1.24)
GFR calc Af Amer: 23 mL/min — ABNORMAL LOW (ref >60)
GFR calc non Af Amer: 20 mL/min — ABNORMAL LOW (ref >60)
Glucose, Bld: 188 mg/dL — ABNORMAL HIGH (ref 70–99)
Potassium: 4.2 mmol/L (ref 3.5–5.1)
Sodium: 134 mmol/L — ABNORMAL LOW (ref 135–145)
Total Bilirubin: 0.9 mg/dL (ref 0.3–1.2)
Total Protein: 5.8 g/dL — ABNORMAL LOW (ref 6.5–8.1)

## 2018-06-07 LAB — MRSA PCR SCREENING: MRSA by PCR: NEGATIVE

## 2018-06-07 LAB — EXPECTORATED SPUTUM ASSESSMENT W GRAM STAIN, RFLX TO RESP C

## 2018-06-07 LAB — CBC
HCT: 31.8 % — ABNORMAL LOW (ref 39.0–52.0)
Hemoglobin: 10.7 g/dL — ABNORMAL LOW (ref 13.0–17.0)
MCH: 35.1 pg — ABNORMAL HIGH (ref 26.0–34.0)
MCHC: 33.6 g/dL (ref 30.0–36.0)
MCV: 104.3 fL — ABNORMAL HIGH (ref 80.0–100.0)
Platelets: 197 10*3/uL (ref 150–400)
RBC: 3.05 MIL/uL — ABNORMAL LOW (ref 4.22–5.81)
RDW: 12.6 % (ref 11.5–15.5)
WBC: 15.1 10*3/uL — ABNORMAL HIGH (ref 4.0–10.5)
nRBC: 0 % (ref 0.0–0.2)

## 2018-06-07 LAB — GLUCOSE, CAPILLARY
Glucose-Capillary: 137 mg/dL — ABNORMAL HIGH (ref 70–99)
Glucose-Capillary: 149 mg/dL — ABNORMAL HIGH (ref 70–99)
Glucose-Capillary: 185 mg/dL — ABNORMAL HIGH (ref 70–99)
Glucose-Capillary: 124 mg/dL — ABNORMAL HIGH (ref 70–99)

## 2018-06-07 LAB — URINE CULTURE: Culture: <10000 — AB

## 2018-06-07 LAB — STREP PNEUMONIAE URINARY ANTIGEN: Strep Pneumo Urinary Antigen: NEGATIVE

## 2018-06-07 MED ORDER — BUDESONIDE 0.25 MG/2ML IN SUSP
4.0000 mL | Freq: Two times a day (BID) | RESPIRATORY_TRACT | Status: DC
  Administered 2018-06-07 – 2018-06-08 (×3): 0.5 mg via RESPIRATORY_TRACT
  Administered 2018-06-09: 0.25 mg via RESPIRATORY_TRACT
  Filled 2018-06-07 (×5): qty 4

## 2018-06-07 MED ORDER — GUAIFENESIN ER 600 MG PO TB12
600.0000 mg | ORAL_TABLET | Freq: Two times a day (BID) | ORAL | Status: DC
  Administered 2018-06-07 – 2018-06-09 (×4): 600 mg via ORAL
  Filled 2018-06-07 (×4): qty 1

## 2018-06-07 MED ORDER — ORAL CARE MOUTH RINSE
15.0000 mL | Freq: Two times a day (BID) | OROMUCOSAL | Status: DC
  Administered 2018-06-07 – 2018-06-09 (×5): 15 mL via OROMUCOSAL

## 2018-06-07 NOTE — Consult Note (Signed)
Consultation Note Date: 06/07/2018   Patient Name: Nathaniel Bolton  DOB: 08-01-1940  MRN: [id number]  Age / Sex: 78 y.o., male  PCP: [name], NP Referring Physician: Barton Dubois, MD  Reason for Consultation: Establishing goals of care and Psychosocial/spiritual support  HPI/Patient Profile: 78 y.o. male  with past medical history of CHF (EF 20%) AICD in place, recurrent cardiac arrests (including last week!), COPD, CKD 3, CVA who was admitted on 06/06/2018 with HCAP and acute on chronic renal failure.  He was discharged from [facility] on Hospice services [facility]), but remains a full code.  Clinical Assessment and Goals of Care:  I have reviewed medical records including EPIC notes, labs and imaging, received report from the care team, assessed the patient and talked with him at bedside, then spoke on the phone with his wife to discuss diagnosis prognosis, GOC, EOL wishes, disposition and options.  I introduced Palliative Medicine as specialized medical care for people living with serious illness. It focuses on providing relief from the symptoms and stress of a serious illness. The goal is to improve quality of life for both the patient and the family.  We discussed a brief life review of the patient.   He lives at home with his wife and was eating / drinking / walking prior to admission.  He has 4 sons.  His wife has a son and a dtr.  The two of them have been married 21 years.  He is a Engineer, manufacturing.  We discussed his current illness and what it means in the larger context of his on-going co-morbidities.  Natural disease trajectory and expectations at EOL were discussed.  I attempted to elicit values and goals of care important to the patient.  [name] did not feel the ICD shocks/cardiac arrests or subsequent intubations.  He comments that he is not ready to go.  He would like to spend a little  more time with his wife and kids.  He did not seem to understand that the health of his heart is not going to improve and that he will continue to have more arrests.  I spoke with his wife.  She seemed distressed by the tremendous health issues.  "I wish someone could make him understand".  The arrests have been traumatizing to her.  She does not want to be apart from him.  She is afraid he will die in the hospital alone rather than at home with her.  She comments that "he is worried about dying and leaving me alone, but he needs to understand I will be ok".   Primary Decision Maker:  PATIENT.   His wife his is HCPOA.    SUMMARY OF RECOMMENDATIONS    Continue current care.    Chaplain consulted to discuss life/death with patient and provide reassurance.  PMT will follow with you.  He will be discharged home with [facility] services when medically stable.  Code Status/Advance Care Planning:  Full.   ICD currently on.   Prognosis:  Less than 6 months even if he leaves his ICD on.  Recurrent cardiac arrests, Renal failure, HF w/ EF of 20%    Discharge Planning: Home with Hospice      Primary Diagnoses: Present on Admission: . HCAP (healthcare-associated pneumonia) . Automatic implantable cardioverter-defibrillator in situ . Atrial fibrillation ([place]) . Diabetic polyneuropathy associated with type 2 diabetes mellitus ([place]) . Chronic systolic CHF (congestive heart failure) ([place]) . Acute kidney injury superimposed on CKD ([place]) . Essential hypertension   I have reviewed the medical record, interviewed the patient and family, and examined the patient. The following aspects are pertinent.  Past Medical History:  Diagnosis Date  . CAD (coronary artery disease)   . CKD stage 3 due to type 2 diabetes mellitus ([place])   . Diabetes mellitus    Type 2  . Erectile dysfunction   . Gout   . Hyperlipidemia   . Hypertension   . ICD (implantable cardiac defibrillator) in place    . Myocardial infarction, old    x2 in [place]. S/P CABG 2003, Dr Roxy Manns  . Obstructive lung disease (HCC)    Moderate per prior PFT's  . Paroxysmal atrial fibrillation ([place]) 3/17   discovered on event monitor  . Stroke [facility])    2013  . Systolic CHF, acute on chronic (HCC)    EF is 27% per myoview and echo October 2012  . Ventricular tachycardia ([place]) 08/2014   175 bpm   Social History   Socioeconomic History  . Marital status: Married    Spouse name: Not on file  . Number of children: 4  . Years of education: Not on file  . Highest education level: Not on file  Occupational History  . Occupation: heavy Radio producer  Social Needs  . Financial resource strain: Not on file  . Food insecurity:    Worry: Not on file    Inability: Not on file  . Transportation needs:    Medical: Not on file    Non-medical: Not on file  Tobacco Use  . Smoking status: Former Smoker    Last attempt to quit: 1991    Years since quitting: 29.4  . Smokeless tobacco: Never Used  Substance and Sexual Activity  . Alcohol use: No  . Drug use: No  . Sexual activity: Not on file  Lifestyle  . Physical activity:    Days per week: Not on file    Minutes per session: Not on file  . Stress: Not on file  Relationships  . Social connections:    Talks on phone: Not on file    Gets together: Not on file    Attends religious service: Not on file    Active member of club or organization: Not on file    Attends meetings of clubs or organizations: Not on file    Relationship status: Not on file  Other Topics Concern  . Not on file  Social History Narrative  . Not on file   Family History  Problem Relation Age of Onset  . Heart failure Mother   . Heart attack Father   . Heart disease Sister        valve replaced  . Heart attack Brother        CABG, aortic grafting   Scheduled Meds: . allopurinol  100 mg Oral Daily  . amiodarone  200 mg Oral BID  . apixaban  5 mg Oral BID  .  atorvastatin  80 mg Oral q1800  .  budesonide  4 mL Inhalation BID  . carvedilol  6.25 mg Oral BID WC  . cholecalciferol  2,000 Units Oral BID  . dutasteride  0.5 mg Oral QHS  . famotidine  20 mg Oral Daily  . gabapentin  300 mg Oral BID  . guaiFENesin  600 mg Oral BID  . insulin aspart  0-9 Units Subcutaneous TID WC  . insulin glargine  10 Units Subcutaneous QHS  . mouth rinse  15 mL Mouth Rinse BID  . sertraline  50 mg Oral Daily  . vitamin E  400 Units Oral Daily   Continuous Infusions: . sodium chloride 1,000 mL (06/07/18 1251)  . ceFEPime (MAXIPIME) IV 2 g (06/07/18 1609)  . [START ON 06/08/2018] vancomycin     PRN Meds:.albuterol, traMADol Allergies  Allergen Reactions  . Adhesive [Tape] Itching and Rash    Testosterone patch adhesive Please use paper tape   Review of Systems no pain, constipation, Dyspnea at rest  Physical Exam  Awake, alert, coherent, clear speech, pleasant Obese,  Bradycardic No resp distress.  Vital Signs: BP (!) 123/54   Pulse (!) 56   Temp 98.4 F (36.9 C)   Resp 13   Ht 5\' 10"  (1.778 m)   Wt 86.2 kg   SpO2 99%   BMI 27.26 kg/m  Pain Scale: 0-10 POSS *See Group Information*: 1-Acceptable,Awake and alert Pain Score: 0-No pain   SpO2: SpO2: 99 % O2 Device:SpO2: 99 % O2 Flow Rate: .O2 Flow Rate (L/min): 3 L/min  IO: Intake/output summary:   Intake/Output Summary (Last 24 hours) at 06/07/2018 1755 Last data filed at 06/07/2018 1251 Gross per 24 hour  Intake 2484.01 ml  Output 850 ml  Net 1634.01 ml    LBM: Last BM Date: 06/04/18 Baseline Weight: Weight: 86.2 kg Most recent weight: Weight: 86.2 kg     Palliative Assessment/Data: 40%     Time In: 4:30 Time Out: 6:00 Time Total: 90 min. Visit consisted of counseling and education dealing with the complex and emotionally intense issues surrounding the need for palliative care and symptom management in the setting of serious and potentially life-threatening illness. Greater  than 50%  of this time was spent counseling and coordinating care related to the above assessment and plan.  Signed by: Florentina Jenny, PA-C Palliative Medicine Pager: [phone]  Please contact Palliative Medicine Team phone at [phone] for questions and concerns.  For individual provider: See Shea Evans

## 2018-06-07 NOTE — Progress Notes (Signed)
Patient was admitted and placed on PC monitor. Patient has devices on both sides of chest. Tele has not been able to read correctly since patient on unit. ECG reading a large amount of interference. This RN and Oswaldo Milian, RN changed placement of leads with no success. Ronalee Belts, Charge RN changed placement of leads with no success. Patient's back used for lead placement with no success. This RN changed tele monitor to a small box monitor with no change. Bodenheimer, NP notified via telephone. Patient A&O x4 with no distress noted at this time. This RN will discuss with Elmon Else to review options. Will continue to monitor.

## 2018-06-07 NOTE — Progress Notes (Signed)
   06/07/18 1949  Clinical Encounter Type  Visited With Patient  Visit Type Initial  Referral From Palliative care team  Consult/Referral To Chaplain  This chaplain responded to PMT request for Pt. spiritual care.  The chaplain introduced herself to the Pt. as a member of PMT.  The Pt. welcomed the visit.  Both the chaplain and the Pt. recognized the time and agreed to continue the conversation at another time. A phone call on the Pt. phone ended the visit.  The chaplain will plan for F/U spiritual care

## 2018-06-07 NOTE — Progress Notes (Addendum)
PROGRESS NOTE    Nathaniel Bolton  XUX:[id number] DOB: 1940-12-27 DOA: 06/06/2018 PCP: [name], NP     Brief Narrative:  78 y.o. male with medical history significant of DM2, HTN, CKD stage 3, CAD s/p CABG, CHF with EF 20%, AICD placement, paroxysmal VTach including V tach arrests in March and then again a mere 9 days ago requiring CPR, intubation, and admission to [place].  Ultimately it seems recurrent VTach was able to be managed with amiodarone.  While there he was treated with zosyn for aspiration PNA.  Ultimately discharged on amoxicillin on 5/25.  Patient discharged home with home hospice though he wishes to remain a full code.  Patient presents to the ED with c/o progressively worsening SOB, productive cough.  Now unable to get out of bed due to generalized weakness.  Assessment & Plan: 1-sepsis secondary to HCAP (healthcare-associated pneumonia); acute on chronic resp failure: -Patient met sepsis criteria on admission (elevated WBCs, increased respiratory rate, increased oxygen needs, low-grade temperature and findings on x-ray-suggested new infiltrates). -Continue gentle IV fluids -Continue IV antibiotics -Follow culture results -Continue supportive care and follow clinical response -Will add Mucinex and continue the use of Pulmicort. -continue weaning oxygen supplementation as tolerated. (patient using 2L [place] at baseline).-  2-Automatic implantable cardioverter-defibrillator in situ -Rate controlled -Continue monitoring on telemetry -Discussed with patient there has not been any symptoms of ICD firing.  3-Chronic systolic CHF (congestive heart failure) (HCC) -Currently compensated -Continue holding diuretics -Continue the use of Coreg -Follow daily weights, and strict intake and output. -Most recent echocardiogram with ejection fraction 20%  4-Atrial fibrillation (HCC) -Rate control -Continue amiodarone and beta-blocker -Continue Eliquis for secondary  prevention.  5-Diabetic polyneuropathy associated with type 2 diabetes mellitus (HCC) -Continue sliding scale insulin and Lantus -Continue gabapentin  6-Essential hypertension -Blood pressure is soft but stable -Home medications as needed to minimize hypertension -Continue holding diuretics -Continue gentle IV fluids.  7-Acute kidney injury superimposed on CKD ([place]) -Stage III at baseline -Most likely associated with decreased perfusion in the setting of sepsis and some dehydration -Will continue holding diuretics and minimize nephrotoxic agents -Adjust medications to renal function -Follow renal function trend -Continue judicious fluid resuscitation.  DVT prophylaxis: Eliquis Code Status: Full code for now Family Communication: Wife updated by phone. Disposition Plan: Remains inpatient, continue IV antibiotics, follow cultures reports are for now continue close monitoring of volume status while holding diuretics.  Consultants:   None  Procedures:   See below for x-ray reports  Antimicrobials:  Anti-infectives (From admission, onward)   Start     Dose/Rate Route Frequency Ordered Stop   06/08/18 1700  vancomycin (VANCOCIN) 1,250 mg in sodium chloride 0.9 % 250 mL IVPB     1,250 mg 166.7 mL/hr over 90 Minutes Intravenous Every 48 hours 06/06/18 1618     06/07/18 1600  ceFEPIme (MAXIPIME) 2 g in sodium chloride 0.9 % 100 mL IVPB     2 g 200 mL/hr over 30 Minutes Intravenous Every 24 hours 06/06/18 1618     06/06/18 1500  vancomycin (VANCOCIN) 1,750 mg in sodium chloride 0.9 % 500 mL IVPB     1,750 mg 250 mL/hr over 120 Minutes Intravenous  Once 06/06/18 1449 06/06/18 1921   06/06/18 1445  vancomycin (VANCOCIN) IVPB 1000 mg/200 mL premix  Status:  Discontinued     1,000 mg 200 mL/hr over 60 Minutes Intravenous  Once 06/06/18 1432 06/06/18 1449   06/06/18 1445  ceFEPIme (MAXIPIME) 2 g  in sodium chloride 0.9 % 100 mL IVPB     2 g 200 mL/hr over 30 Minutes Intravenous   Once 06/06/18 1432 06/06/18 1709       Subjective: Afebrile at this moment.  Denies nausea or vomiting.  Still short of breath and requiring 3-3.5 L nasal cannula supplementation (patient normally uses only 2 L at baseline).  Positive productive cough, mild expiratory wheezing and feeling short of breath.  Objective: Vitals:   06/07/18 0655 06/07/18 0828 06/07/18 0847 06/07/18 1251  BP:  (!) 97/48  (!) 114/54  Pulse: (!) 53 (!) 55  60  Resp:  (!) 0  16  Temp:      TempSrc:      SpO2: 98% 98% 98% 97%  Weight:      Height:        Intake/Output Summary (Last 24 hours) at 06/07/2018 1614 Last data filed at 06/07/2018 1251 Gross per 24 hour  Intake 2584.01 ml  Output 850 ml  Net 1734.01 ml   Filed Weights   06/06/18 1434  Weight: 86.2 kg    Examination: General exam: Alert, awake, oriented x 3, reports he still feeling short of breath, having some chest tightness sensation and complaining of productive cough.  Low-grade temperature overnight; afebrile at this moment. Respiratory system: Positive rhonchi bilaterally, no crackles, no using accessory muscles.  Positive expiratory wheezing. Cardiovascular system:Rate controlled, no rubs, no gallops.  Gastrointestinal system: Abdomen is nondistended, soft and nontender. No organomegaly or masses felt. Normal bowel sounds heard. Central nervous system: Alert and oriented. No focal neurological deficits. Extremities: No cyanosis or clubbing; trace edema bilaterally. Skin: No rashes, l no petechiae. Psychiatry: Judgement and insight appear normal. Mood & affect appropriate.     Data Reviewed: I have personally reviewed following labs and imaging studies  CBC: Recent Labs  Lab 06/06/18 1504 06/07/18 0248  WBC 14.8* 15.1*  NEUTROABS 11.6*  --   HGB 11.4* 10.7*  HCT 33.3* 31.8*  MCV 103.4* 104.3*  PLT 216 244   Basic Metabolic Panel: Recent Labs  Lab 06/06/18 1504 06/07/18 0248  NA 128* 134*  K 4.3 4.2  CL 89* 96*   CO2 25 24  GLUCOSE 301* 188*  BUN 94* 89*  CREATININE 3.32* 2.96*  CALCIUM 8.5* 8.4*   GFR: Estimated Creatinine Clearance: 21.9 mL/min (A) (by C-G formula based on SCr of 2.96 mg/dL (H)).   Liver Function Tests: Recent Labs  Lab 06/06/18 1504 06/07/18 0248  AST 85* 93*  ALT 77* 78*  ALKPHOS 78 69  BILITOT 0.7 0.9  PROT 6.4* 5.8*  ALBUMIN 2.3* 2.0*   Cardiac Enzymes: Recent Labs  Lab 06/06/18 1504  TROPONINI 0.04*   CBG: Recent Labs  Lab 06/06/18 2253 06/07/18 0808 06/07/18 1209 06/07/18 1557  GLUCAP 172* 137* 149* 185*   Urine analysis:    Component Value Date/Time   COLORURINE YELLOW 06/06/2018 [place] 06/06/2018 1524   LABSPEC 1.011 06/06/2018 1524   PHURINE 5.0 06/06/2018 [place] 06/06/2018 1524   [place] 06/06/2018 [place] 06/06/2018 [place] 06/06/2018 1524   PROTEINUR NEGATIVE 06/06/2018 1524   NITRITE NEGATIVE 06/06/2018 1524   [place] 06/06/2018 1524    Recent Results (from the past 240 hour(s))  Blood Culture (routine x 2)     Status: None (Preliminary result)   Collection Time: 06/06/18  2:32 PM  Result Value Ref Range Status   Specimen  Description BLOOD BLOOD RIGHT HAND  Final   Special Requests   Final    BOTTLES DRAWN AEROBIC AND ANAEROBIC Blood Culture results may not be optimal due to an inadequate volume of blood received in culture bottles   Culture   Final    NO GROWTH < 24 HOURS Performed at [facility] [address] [place], [place] [address]    Report Status PENDING  Incomplete  Blood Culture (routine x 2)     Status: None (Preliminary result)   Collection Time: 06/06/18  2:37 PM  Result Value Ref Range Status   Specimen Description BLOOD RIGHT ANTECUBITAL  Final   Special Requests   Final    BOTTLES DRAWN AEROBIC AND ANAEROBIC Blood Culture adequate volume   Culture   Final    NO GROWTH < 24 HOURS Performed at [facility], [place] [address] [place], [place] [address]    Report Status PENDING  Incomplete  Urine culture     Status: Abnormal   Collection Time: 06/06/18  3:04 PM  Result Value Ref Range Status   Specimen Description URINE, CATHETERIZED  Final   Special Requests NONE  Final   Culture (A)  Final    <10,000 COLONIES/mL INSIGNIFICANT GROWTH Performed at [facility], [place] [address] [place], [place] [address]    Report Status 06/07/2018 FINAL  Final  SARS Coronavirus 2 (CEPHEID- Performed in [place] hospital lab), Hosp Order     Status: None   Collection Time: 06/06/18  4:06 PM  Result Value Ref Range Status   SARS Coronavirus 2 NEGATIVE NEGATIVE Final    Comment: (NOTE) If result is NEGATIVE SARS-CoV-2 target nucleic acids are NOT DETECTED. The SARS-CoV-2 RNA is generally detectable in upper and lower  respiratory specimens during the acute phase of infection. The lowest  concentration of SARS-CoV-2 viral copies this assay can detect is 250  copies / mL. A negative result does not preclude SARS-CoV-2 infection  and should not be used as the sole basis for treatment or other  patient management decisions.  A negative result may occur with  improper specimen collection / handling, submission of specimen other  than nasopharyngeal swab, presence of viral mutation(s) within the  areas targeted by this assay, and inadequate number of viral copies  (<250 copies / mL). A negative result must be combined with clinical  observations, patient history, and epidemiological information. If result is POSITIVE SARS-CoV-2 target nucleic acids are DETECTED. The SARS-CoV-2 RNA is generally detectable in upper and lower  respiratory specimens dur ing the acute phase of infection.  Positive  results are indicative of active infection with SARS-CoV-2.  Clinical  correlation with patient history and other diagnostic information is  necessary to determine patient infection status.  Positive  results do  not rule out bacterial infection or co-infection with other viruses. If result is PRESUMPTIVE POSTIVE SARS-CoV-2 nucleic acids MAY BE PRESENT.   A presumptive positive result was obtained on the submitted specimen  and confirmed on repeat testing.  While 2019 novel coronavirus  (SARS-CoV-2) nucleic acids may be present in the submitted sample  additional confirmatory testing may be necessary for epidemiological  and / or clinical management purposes  to differentiate between  SARS-CoV-2 and other Sarbecovirus currently known to infect humans.  If clinically indicated additional testing with an alternate test  methodology [id number]) is advised. The SARS-CoV-2 RNA is generally  detectable in upper and lower respiratory sp ecimens during  the acute  phase of infection. The expected result is Negative. Fact Sheet for Patients:  StrictlyIdeas.no Fact Sheet for Healthcare Providers: BankingDealers.co.za This test is not yet approved or cleared by the [place] FDA and has been authorized for detection and/or diagnosis of SARS-CoV-2 by FDA under an Emergency Use Authorization (EUA).  This EUA will remain in effect (meaning this test can be used) for the duration of the COVID-19 declaration under Section 564(b)(1) of the Act, 21 U.S.C. section 360bbb-3(b)(1), unless the authorization is terminated or revoked sooner. Performed at [facility], [place] [address], [place], [place] [address]   MRSA PCR Screening     Status: None   Collection Time: 06/07/18  5:12 AM  Result Value Ref Range Status   MRSA by PCR NEGATIVE NEGATIVE Final    Comment:        The GeneXpert MRSA Assay (FDA approved for NASAL specimens only), is one component of a comprehensive MRSA colonization surveillance program. It is not intended to diagnose MRSA infection nor to guide or monitor treatment for MRSA infections. Performed at [facility], [place] [address] [place] [place], [place] [address]   Culture, sputum-assessment     Status: None   Collection Time: 06/07/18  6:55 AM  Result Value Ref Range Status   Specimen Description SPUTUM  Final   Special Requests NONE  Final   Sputum evaluation   Final    THIS SPECIMEN IS ACCEPTABLE FOR SPUTUM CULTURE Performed at [facility], [address], [place], [place] [address]    Report Status 06/07/2018 FINAL  Final  Culture, respiratory     Status: None (Preliminary result)   Collection Time: 06/07/18  6:55 AM  Result Value Ref Range Status   Specimen Description SPUTUM  Final   Special Requests NONE Reflexed from [id number]  Final   Gram Stain   Final    ABUNDANT WBC PRESENT, PREDOMINANTLY PMN FEW GRAM POSITIVE RODS Performed at [facility], [place] [address] [place],  [address]    Culture PENDING  Incomplete   Report Status PENDING  Incomplete      Radiology Studies: Dg Chest Port 1 View  Result Date: 06/06/2018 CLINICAL DATA:  Cough and short of breath EXAM: PORTABLE CHEST 1 VIEW COMPARISON:  05/30/2018 FINDINGS: Postop CABG. AICD. Nerve stimulator on the right extending into the neck is unchanged Cardiac enlargement without heart failure or edema. Mild atelectasis in the lung bases. No pleural effusion. IMPRESSION: Mild bibasilar atelectasis. Negative for heart failure or pneumonia. Electronically Signed   By: Franchot Gallo M.D.   On: 06/06/2018 15:19    Scheduled Meds: . allopurinol  100 mg Oral Daily  . alum & mag hydroxide-simeth  30 mL Oral Daily  . amiodarone  200 mg Oral BID  . apixaban  5 mg Oral BID  . atorvastatin  80 mg Oral q1800  . budesonide  2 mL Inhalation BID  . carvedilol  6.25 mg Oral BID WC  . cholecalciferol  2,000 Units Oral BID  . dutasteride  0.5 mg Oral QHS  . famotidine  20 mg Oral Daily  . gabapentin  300 mg Oral BID  . insulin aspart  0-9 Units Subcutaneous TID WC  . insulin glargine  10 Units Subcutaneous QHS  . mouth rinse  15  mL Mouth Rinse BID  . sertraline  50 mg Oral Daily  . vitamin E  400 Units Oral Daily   Continuous Infusions: .  sodium chloride 1,000 mL (06/07/18 1251)  . ceFEPime (MAXIPIME) IV 2 g (06/07/18 1609)  . [START ON 06/08/2018] vancomycin       LOS: 1 day    Time spent: 35 minutes. Greater than 50% of this time was spent in direct contact with the patient, coordinating care and discussing relevant ongoing clinical issues, including findings concerning infiltrates on x-ray; need for IV antibiotics and discussion regarding CODE STATUS and implications of further invasive backslash heroic measurements.    Barton Dubois, MD [facility] Pager [phone]  06/07/2018, 4:14 PM

## 2018-06-07 NOTE — Plan of Care (Signed)
  Problem: Activity: Goal: Ability to tolerate increased activity will improve Outcome: Not Progressing  Generalized weakness. Independence in ADLs encouraged.

## 2018-06-07 NOTE — Progress Notes (Signed)
Hospice nurse calling. Wants ED transcript. Unable to give, CM gone for the day. Hospice nurse to call tomorrow.

## 2018-06-08 LAB — GLUCOSE, CAPILLARY
Glucose-Capillary: 143 mg/dL — ABNORMAL HIGH (ref 70–99)
Glucose-Capillary: 173 mg/dL — ABNORMAL HIGH (ref 70–99)
Glucose-Capillary: 194 mg/dL — ABNORMAL HIGH (ref 70–99)
Glucose-Capillary: 206 mg/dL — ABNORMAL HIGH (ref 70–99)
Glucose-Capillary: 215 mg/dL — ABNORMAL HIGH (ref 70–99)
Glucose-Capillary: 225 mg/dL — ABNORMAL HIGH (ref 70–99)

## 2018-06-08 LAB — HIV ANTIBODY (ROUTINE TESTING W REFLEX): HIV Screen 4th Generation wRfx: NONREACTIVE

## 2018-06-08 MED ORDER — SODIUM CHLORIDE 0.9 % IV SOLN
1000.0000 mL | INTRAVENOUS | Status: DC
  Administered 2018-06-08: 17:00:00 1000 mL via INTRAVENOUS

## 2018-06-08 NOTE — Progress Notes (Signed)
PROGRESS NOTE    Nathaniel Bolton  MBW:[id number] DOB: 12-Sep-1940 DOA: 06/06/2018 PCP: [name], NP     Brief Narrative:  78 y.o. male with medical history significant of DM2, HTN, CKD stage 3, CAD s/p CABG, CHF with EF 20%, AICD placement, paroxysmal VTach including V tach arrests in March and then again a mere 9 days ago requiring CPR, intubation, and admission to [facility].  Ultimately it seems recurrent VTach was able to be managed with amiodarone.  While there he was treated with zosyn for aspiration PNA.  Ultimately discharged on amoxicillin on 5/25.  Patient discharged home with home hospice though he wishes to remain a full code.  Patient presents to the ED with c/o progressively worsening SOB, productive cough.  Now unable to get out of bed due to generalized weakness.  Assessment & Plan: 1-sepsis secondary to HCAP (healthcare-associated pneumonia); acute on chronic resp failure: -Patient met sepsis criteria on admission (elevated WBCs, increased respiratory rate, increased oxygen needs, low-grade temperature and findings on x-ray-suggested new infiltrates). -Continue gentle IV fluids; adjusting IV fluid rates. -Continue IV antibiotics -Follow culture results -Continue supportive care and follow clinical response -Will continue Mucinex and continue the use of Pulmicort. -continue weaning oxygen supplementation as tolerated. (patient using 2L [place] at baseline).-  2-Automatic implantable cardioverter-defibrillator in situ -Rate controlled -Continue monitoring on telemetry -Discussed with patient there has not been any symptoms of ICD firing.  3-Chronic systolic CHF (congestive heart failure) (HCC) -Currently compensated -Continue holding diuretics -Continue the use of Coreg -Follow daily weights, and strict intake and output. -Most recent echocardiogram with ejection fraction 20%  4-Atrial fibrillation (HCC) -Rate control -Continue amiodarone and beta-blocker  -Continue Eliquis for secondary prevention.  5-Diabetic polyneuropathy associated with type 2 diabetes mellitus (HCC) -Continue sliding scale insulin and Lantus -Continue gabapentin  6-Essential hypertension -Blood pressure is soft but stable -Home medications as needed to minimize hypertension -Continue holding diuretics -Continue gentle IV fluids.  7-Acute kidney injury superimposed on CKD ([place]) -Stage III at baseline -Most likely associated with decreased perfusion in the setting of sepsis and some dehydration -Will continue holding diuretics and minimize nephrotoxic agents -Follow renal function trend; basic metabolic panel in a.m. -Continue judicious fluid resuscitation; adjusting IV fluid rate.  DVT prophylaxis: Eliquis Code Status: Full code for now Family Communication: Wife updated by phone. Disposition Plan: Remains inpatient, continue IV antibiotics, follow cultures reports are for now continue close monitoring of volume status while holding diuretics.  Consultants:   None  Procedures:   See below for x-ray reports  Antimicrobials:  Anti-infectives (From admission, onward)   Start     Dose/Rate Route Frequency Ordered Stop   06/08/18 1700  vancomycin (VANCOCIN) 1,250 mg in sodium chloride 0.9 % 250 mL IVPB     1,250 mg 166.7 mL/hr over 90 Minutes Intravenous Every 48 hours 06/06/18 1618     06/07/18 1600  ceFEPIme (MAXIPIME) 2 g in sodium chloride 0.9 % 100 mL IVPB     2 g 200 mL/hr over 30 Minutes Intravenous Every 24 hours 06/06/18 1618     06/06/18 1500  vancomycin (VANCOCIN) 1,750 mg in sodium chloride 0.9 % 500 mL IVPB     1,750 mg 250 mL/hr over 120 Minutes Intravenous  Once 06/06/18 1449 06/06/18 1921   06/06/18 1445  vancomycin (VANCOCIN) IVPB 1000 mg/200 mL premix  Status:  Discontinued     1,000 mg 200 mL/hr over 60 Minutes Intravenous  Once 06/06/18 1432 06/06/18 1449  06/06/18 1445  ceFEPIme (MAXIPIME) 2 g in sodium chloride 0.9 % 100 mL IVPB      2 g 200 mL/hr over 30 Minutes Intravenous  Once 06/06/18 1432 06/06/18 1709      Subjective: Afebrile.  No nausea, no vomiting, still short of breath and using higher level of nasal cannula supplementation that he uses at baseline.  No chest pain or palpitations.  Overall feeling better.  Objective: Vitals:   06/08/18 0536 06/08/18 0545 06/08/18 0600 06/08/18 0731  BP: 114/62     Pulse:  (!) 57 (!) 57   Resp: '19 18 14   ' Temp:      TempSrc:      SpO2:  98% 99% 98%  Weight: 90.8 kg     Height:        Intake/Output Summary (Last 24 hours) at 06/08/2018 1619 Last data filed at 06/08/2018 1023 Gross per 24 hour  Intake 1731.39 ml  Output 1900 ml  Net -168.61 ml   Filed Weights   06/06/18 1434 06/08/18 0536  Weight: 86.2 kg 90.8 kg    Examination: General exam: Alert, awake, oriented x 3; reports improvement in his breathing, still having some shortness of breath.  Using 3-3.5 L nasal cannula supplementation and having productive cough.  Denies chest pain and palpitations.  No orthopnea.  Patient is afebrile. Respiratory system: Diffuse rhonchi bilaterally, no crackles, no using accessory muscles.  Mild expiratory wheezing appreciated on exam. Cardiovascular system:Rate controlled. No murmurs, rubs, gallops. Gastrointestinal system: Abdomen is nondistended, soft and nontender. No organomegaly or masses felt. Normal bowel sounds heard. Central nervous system: Alert and oriented. No focal neurological deficits. Extremities: No C/C/E, +pedal pulses Skin: No rashes, no petechiae. Psychiatry: Judgement and insight appear normal. Mood & affect appropriate.    Data Reviewed: I have personally reviewed following labs and imaging studies  CBC: Recent Labs  Lab 06/06/18 1504 06/07/18 0248  WBC 14.8* 15.1*  NEUTROABS 11.6*  --   HGB 11.4* 10.7*  HCT 33.3* 31.8*  MCV 103.4* 104.3*  PLT 216 177   Basic Metabolic Panel: Recent Labs  Lab 06/06/18 1504 06/07/18 0248  NA 128*  134*  K 4.3 4.2  CL 89* 96*  CO2 25 24  GLUCOSE 301* 188*  BUN 94* 89*  CREATININE 3.32* 2.96*  CALCIUM 8.5* 8.4*   GFR: Estimated Creatinine Clearance: 24.1 mL/min (A) (by C-G formula based on SCr of 2.96 mg/dL (H)).   Liver Function Tests: Recent Labs  Lab 06/06/18 1504 06/07/18 0248  AST 85* 93*  ALT 77* 78*  ALKPHOS 78 69  BILITOT 0.7 0.9  PROT 6.4* 5.8*  ALBUMIN 2.3* 2.0*   Cardiac Enzymes: Recent Labs  Lab 06/06/18 1504  TROPONINI 0.04*   CBG: Recent Labs  Lab 06/07/18 1209 06/07/18 1557 06/07/18 2124 06/08/18 0828 06/08/18 1234  GLUCAP 149* 185* 124* 173* 225*   Urine analysis:    Component Value Date/Time   COLORURINE YELLOW 06/06/2018 [place] 06/06/2018 1524   LABSPEC 1.011 06/06/2018 1524   PHURINE 5.0 06/06/2018 [place] 06/06/2018 1524   [place] 06/06/2018 [place] 06/06/2018 [place] 06/06/2018 [place] 06/06/2018 1524   NITRITE NEGATIVE 06/06/2018 1524   [place] 06/06/2018 1524    Recent Results (from the past 240 hour(s))  Blood Culture (routine x 2)     Status: None (Preliminary result)   Collection Time: 06/06/18  2:32  PM  Result Value Ref Range Status   Specimen Description BLOOD BLOOD RIGHT HAND  Final   Special Requests   Final    BOTTLES DRAWN AEROBIC AND ANAEROBIC Blood Culture results may not be optimal due to an inadequate volume of blood received in culture bottles   Culture   Final    NO GROWTH < 24 HOURS Performed at [facility] [address], [place], [place] [address]    Report Status PENDING  Incomplete  Blood Culture (routine x 2)     Status: None (Preliminary result)   Collection Time: 06/06/18  2:37 PM  Result Value Ref Range Status   Specimen Description BLOOD RIGHT ANTECUBITAL  Final   Special Requests   Final    BOTTLES DRAWN AEROBIC AND ANAEROBIC Blood Culture adequate volume   Culture    Final    NO GROWTH < 24 HOURS Performed at [facility], [place] [address] [place], [place] [address]    Report Status PENDING  Incomplete  Urine culture     Status: Abnormal   Collection Time: 06/06/18  3:04 PM  Result Value Ref Range Status   Specimen Description URINE, CATHETERIZED  Final   Special Requests NONE  Final   Culture (A)  Final    <10,000 COLONIES/mL INSIGNIFICANT GROWTH Performed at [facility], [place] [address] [place], [place] [address]    Report Status 06/07/2018 FINAL  Final  SARS Coronavirus 2 (CEPHEID- Performed in [facility] lab), Hosp Order     Status: None   Collection Time: 06/06/18  4:06 PM  Result Value Ref Range Status   SARS Coronavirus 2 NEGATIVE NEGATIVE Final    Comment: (NOTE) If result is NEGATIVE SARS-CoV-2 target nucleic acids are NOT DETECTED. The SARS-CoV-2 RNA is generally detectable in upper and lower  respiratory specimens during the acute phase of infection. The lowest  concentration of SARS-CoV-2 viral copies this assay can detect is 250  copies / mL. A negative result does not preclude SARS-CoV-2 infection  and should not be used as the sole basis for treatment or other  patient management decisions.  A negative result may occur with  improper specimen collection / handling, submission of specimen other  than nasopharyngeal swab, presence of viral mutation(s) within the  areas targeted by this assay, and inadequate number of viral copies  (<250 copies / mL). A negative result must be combined with clinical  observations, patient history, and epidemiological information. If result is POSITIVE SARS-CoV-2 target nucleic acids are DETECTED. The SARS-CoV-2 RNA is generally detectable in upper and lower  respiratory specimens dur ing the acute phase of infection.  Positive  results are indicative of active infection with SARS-CoV-2.  Clinical  correlation with patient history and other diagnostic information is   necessary to determine patient infection status.  Positive results do  not rule out bacterial infection or co-infection with other viruses. If result is PRESUMPTIVE POSTIVE SARS-CoV-2 nucleic acids MAY BE PRESENT.   A presumptive positive result was obtained on the submitted specimen  and confirmed on repeat testing.  While 2019 novel coronavirus  (SARS-CoV-2) nucleic acids may be present in the submitted sample  additional confirmatory testing may be necessary for epidemiological  and / or clinical management purposes  to differentiate between  SARS-CoV-2 and other Sarbecovirus currently known to infect humans.  If clinically indicated additional testing with an alternate test  methodology [phone]) is advised. The SARS-CoV-2 RNA is generally  detectable in upper and lower respiratory sp ecimens during the acute  phase of infection. The expected result is Negative. Fact Sheet for Patients:  StrictlyIdeas.no Fact Sheet for Healthcare Providers: BankingDealers.co.za This test is not yet approved or cleared by the [place] FDA and has been authorized for detection and/or diagnosis of SARS-CoV-2 by FDA under an Emergency Use Authorization (EUA).  This EUA will remain in effect (meaning this test can be used) for the duration of the COVID-19 declaration under Section 564(b)(1) of the Act, 21 U.S.C. section 360bbb-3(b)(1), unless the authorization is terminated or revoked sooner. Performed at [facility], [place] [address] [place], [place] [address]   MRSA PCR Screening     Status: None   Collection Time: 06/07/18  5:12 AM  Result Value Ref Range Status   MRSA by PCR NEGATIVE NEGATIVE Final    Comment:        The GeneXpert MRSA Assay (FDA approved for NASAL specimens only), is one component of a comprehensive MRSA colonization surveillance program. It is not intended to diagnose MRSA infection nor to guide or monitor  treatment for MRSA infections. Performed at [facility], [place] [address], [place], [place] [address]   Culture, sputum-assessment     Status: None   Collection Time: 06/07/18  6:55 AM  Result Value Ref Range Status   Specimen Description SPUTUM  Final   Special Requests NONE  Final   Sputum evaluation   Final    THIS SPECIMEN IS ACCEPTABLE FOR SPUTUM CULTURE Performed at [facility], [address] [place], [place] [address]    Report Status 06/07/2018 FINAL  Final  Culture, respiratory     Status: None (Preliminary result)   Collection Time: 06/07/18  6:55 AM  Result Value Ref Range Status   Specimen Description SPUTUM  Final   Special Requests NONE Reflexed from [id number]  Final   Gram Stain   Final    ABUNDANT WBC PRESENT, PREDOMINANTLY PMN FEW GRAM POSITIVE RODS Performed at [facility], [place] [address], [place],  [address]    Culture FEW YEAST  Final   Report Status PENDING  Incomplete      Radiology Studies: No results found.  Scheduled Meds: . allopurinol  100 mg Oral Daily  . amiodarone  200 mg Oral BID  . apixaban  5 mg Oral BID  . atorvastatin  80 mg Oral q1800  . budesonide  4 mL Inhalation BID  . carvedilol  6.25 mg Oral BID WC  . cholecalciferol  2,000 Units Oral BID  . dutasteride  0.5 mg Oral QHS  . famotidine  20 mg Oral Daily  . gabapentin  300 mg Oral BID  . guaiFENesin  600 mg Oral BID  . insulin aspart  0-9 Units Subcutaneous TID WC  . insulin glargine  10 Units Subcutaneous QHS  . mouth rinse  15 mL Mouth Rinse BID  . sertraline  50 mg Oral Daily  . vitamin E  400 Units Oral Daily   Continuous Infusions: . sodium chloride    . ceFEPime (MAXIPIME) IV Stopped (06/07/18 1647)  . vancomycin       LOS: 2 days   Time spent: 35 minutes.  Greater than 50% of this time was spent in direct contact with the patient, coordinating care and discussing relevant ongoing clinical issues, including findings consistent with  healthcare associated pneumonia, infiltrates on x-rays, continued need of IV antibiotics while holding medications that  assist controlling his heart failure in the setting of acute on chronic renal failure.    Barton Dubois, MD [facility] Pager [phone]  06/08/2018, 4:19 PM

## 2018-06-09 DIAGNOSIS — N189 Chronic kidney disease, unspecified: Secondary | ICD-10-CM

## 2018-06-09 DIAGNOSIS — N179 Acute kidney failure, unspecified: Secondary | ICD-10-CM

## 2018-06-09 DIAGNOSIS — E1121 Type 2 diabetes mellitus with diabetic nephropathy: Secondary | ICD-10-CM

## 2018-06-09 DIAGNOSIS — R531 Weakness: Secondary | ICD-10-CM

## 2018-06-09 DIAGNOSIS — I480 Paroxysmal atrial fibrillation: Secondary | ICD-10-CM

## 2018-06-09 DIAGNOSIS — Z95810 Presence of automatic (implantable) cardiac defibrillator: Secondary | ICD-10-CM

## 2018-06-09 DIAGNOSIS — E1142 Type 2 diabetes mellitus with diabetic polyneuropathy: Secondary | ICD-10-CM

## 2018-06-09 DIAGNOSIS — J189 Pneumonia, unspecified organism: Secondary | ICD-10-CM

## 2018-06-09 DIAGNOSIS — Z66 Do not resuscitate: Secondary | ICD-10-CM

## 2018-06-09 DIAGNOSIS — I10 Essential (primary) hypertension: Secondary | ICD-10-CM

## 2018-06-09 DIAGNOSIS — I5022 Chronic systolic (congestive) heart failure: Secondary | ICD-10-CM

## 2018-06-09 DIAGNOSIS — Z515 Encounter for palliative care: Secondary | ICD-10-CM

## 2018-06-09 LAB — BASIC METABOLIC PANEL
Anion gap: 8 (ref 5–15)
BUN: 44 mg/dL — ABNORMAL HIGH (ref 8–23)
CO2: 25 mmol/L (ref 22–32)
Calcium: 8.4 mg/dL — ABNORMAL LOW (ref 8.9–10.3)
Chloride: 103 mmol/L (ref 98–111)
Creatinine, Ser: 1.49 mg/dL — ABNORMAL HIGH (ref 0.61–1.24)
GFR calc Af Amer: 52 mL/min — ABNORMAL LOW (ref >60)
GFR calc non Af Amer: 45 mL/min — ABNORMAL LOW (ref >60)
Glucose, Bld: 156 mg/dL — ABNORMAL HIGH (ref 70–99)
Potassium: 4.2 mmol/L (ref 3.5–5.1)
Sodium: 136 mmol/L (ref 135–145)

## 2018-06-09 LAB — CBC
HCT: 29 % — ABNORMAL LOW (ref 39.0–52.0)
Hemoglobin: 9.7 g/dL — ABNORMAL LOW (ref 13.0–17.0)
MCH: 34.9 pg — ABNORMAL HIGH (ref 26.0–34.0)
MCHC: 33.4 g/dL (ref 30.0–36.0)
MCV: 104.3 fL — ABNORMAL HIGH (ref 80.0–100.0)
Platelets: 203 10*3/uL (ref 150–400)
RBC: 2.78 MIL/uL — ABNORMAL LOW (ref 4.22–5.81)
RDW: 12.5 % (ref 11.5–15.5)
WBC: 11 10*3/uL — ABNORMAL HIGH (ref 4.0–10.5)
nRBC: 0 % (ref 0.0–0.2)

## 2018-06-09 LAB — CULTURE, RESPIRATORY W GRAM STAIN

## 2018-06-09 LAB — GLUCOSE, CAPILLARY
Glucose-Capillary: 146 mg/dL — ABNORMAL HIGH (ref 70–99)
Glucose-Capillary: 135 mg/dL — ABNORMAL HIGH (ref 70–99)
Glucose-Capillary: 233 mg/dL — ABNORMAL HIGH (ref 70–99)

## 2018-06-09 MED ORDER — FUROSEMIDE 40 MG PO TABS: 60.0000 mg | ORAL_TABLET | Freq: Two times a day (BID) | ORAL | 1 refills | Status: AC

## 2018-06-09 MED ORDER — LOSARTAN POTASSIUM 50 MG PO TABS: 25.0000 mg | ORAL_TABLET | Freq: Every day | ORAL | Status: AC

## 2018-06-09 MED ORDER — AMOXICILLIN-POT CLAVULANATE 500-125 MG PO TABS: 1.0000 | ORAL_TABLET | Freq: Two times a day (BID) | ORAL | 0 refills | Status: AC

## 2018-06-09 NOTE — Progress Notes (Addendum)
Patient continues on PC monitor. Patient has cardiac devices on both sides of chest: barostim on R and AICD on L.   Teleand RN  has not been able to read ECG correctly   Chest pads, leads and monitors changed with no change in ECG reading.   ECG reading a large amount of interference.   Rapid [place] RN  called [name] CCMD notified via telephone. Patient A&O x4 with no distress noted at this time. Will continue to monitor.

## 2018-06-09 NOTE — Consult Note (Signed)
   [facility] CM Inpatient Consult   06/09/2018  Nathaniel Bolton 03/07/1940 [id number]   Patient is currently active with [facility] for chronic disease management services.  Patient has been engaged by a [facility] Management Coordinator.  Patient accessed for high risk scores for unplanned readmission/hospitalization with recently transitioning from [facility] Our community based plan of care has focused on disease management and community resource support.    Chart review reveals that the patient has recently become active with Hospice prior to admission per Palliative consult.  Also, according to MD notes as follows:  78 y.o.malewith medical history significant ofDM2, HTN, CKD stage 3, CAD s/p CABG, CHF with EF 20%, AICD placement, paroxysmal VTach including V tach arrests in March and then again a mere 9 days ago requiring CPR, intubation, and admission to [place]. Ultimately it seems recurrent VTach was able to be managed with amiodarone. While there he was treated with zosyn for aspiration PNA. Ultimately discharged on amoxicillin on 5/25 [from [facility]].  Spoke with Inpatient [facility] team member to make aware that [facility] following and to check patient's progress and disposition. Patient to go home with Hospice care with [facility]. Will update [facility] Coordinator RNCM of disposition.   Of note, [facility] Care Management services does not replace or interfere with any services that are needed or arranged by inpatient case management or social work.  For additional questions or referrals please contact:  Natividad Brood, RN BSN [facility] Liaison  [phone] business mobile phone Toll free office [phone]  Fax number: [phone] [email or web address]

## 2018-06-09 NOTE — Progress Notes (Signed)
Called regarding artifact on EKG strip.  See RR note on 5/30. Primary lead changed to v-lead on monitor and QRS complexes more visible on bedside monitor and CCMD's monitor.  Charge RN updated on situation.

## 2018-06-09 NOTE — TOC Transition Note (Addendum)
Transition of Care [facility]) - CM/SW Discharge Note   Patient Details  Name: Nathaniel Bolton MRN: [id number] Date of Birth: 05-06-1940  Transition of Care [facility]) CM/SW Contact:  Sharin Mons, RN Phone Number: 06/09/2018, 12:54 PM   Clinical Narrative:    Presented with HCAP. From home with wife. Pt has been active with [facility] in the past.  Pt active with [facility] with home hospice care.  Pt will transition to home today with the resumption of home hospice care. Liaison ( Sherrie Merrilyn Puma [phone]) aware of d/c plan. [facility] will provide transportation services to home. Nurse is aware and will call to arranged pt pick up for transportation to home.  Final next level of care: Home w Hospice Care(Active with [facility]) Barriers to Discharge: Barriers Resolved   Patient Goals and CMS Choice Patient states their goals for this hospitalization and ongoing recovery are:: to go home CMS Medicare.gov Compare Post Acute Care list provided to:: Patient Choice offered to / list presented to : NA  Discharge Placement                       Discharge Plan and Services                            [place]: [facility](Resumption of home hospice services) Date [facility] Agency Contacted: 06/09/18 Time [place]: [id number] Representative spoke with at [place]: [place] Determinants of Health ([place]) Interventions     Readmission Risk Interventions No flowsheet data found.

## 2018-06-09 NOTE — Plan of Care (Addendum)
Nathaniel Bolton, CCMD  is going to call biomed and will call me back pertaining to artifact, HR 50s per Tele, pulse via Pulse Ox  matching Tele, pt asymptomatic, SP02 4L 98% (on RA to 3L falling asleep with sats 89-93%)  BP 118/61 MAP 80, denies pain.  Denied AICD firing.

## 2018-06-09 NOTE — Progress Notes (Signed)
Called by RN about inability to pickup EKG strip without artifact.  Per RN, pt alert and oriented, BP stable. Pt has L AICD and R ?barostim device.  Question whether barostim device is impeding ability to adequately monitor EKG rhythm. [facility] NP notified prior to calling rapid response RN and doesn't want to discontinue cardiac monitoring d/t pt history of vtach. When EKG strip reviewed, there is large amount artifact regardless of where leads are placed (attempted posterior placement as well). There is also artifact on all 12 lead EKGs that patient has had.  Though large amount artifact is present, tip of QRS wave is visible on EKG strip.  Spoke with CCMD and asked them to keep a close eye on patient's EKG strip since only the tip of the QRS waveform is present.  Asked RN to place pt on a continuous pulse-ox bedside monitor so HR could be monitored using pt pulse instead of rhythm strip. Also asked RN to alert day shift rounding team of difficulty monitoring EKG rhythm.

## 2018-06-09 NOTE — Discharge Instructions (Signed)

## 2018-06-09 NOTE — Discharge Summary (Signed)
Physician Discharge Summary  Nathaniel Bolton QHU:[id number] DOB: 28-Dec-1940 DOA: 06/06/2018  PCP: [name], NP  Admit date: 06/06/2018 Discharge date: 06/09/2018  Time spent: 35 minutes  Recommendations for Outpatient Follow-up:  1. Repeat basic metabolic panel to follow electrolytes and renal function 2. Repeat chest x-ray in 4-6 weeks to assure complete resolution of infiltrates 3. Reassess vital signs/volume status and further adjust antihypertension regimen and diuretics as needed.   Discharge Diagnoses:  Principal Problem:   HCAP (healthcare-associated pneumonia) Active Problems:   Type 2 diabetes with nephropathy (HCC)   Automatic implantable cardioverter-defibrillator in situ   Chronic systolic CHF (congestive heart failure) (HCC)   Atrial fibrillation (HCC)   Diabetic polyneuropathy associated with type 2 diabetes mellitus ([place])   Essential hypertension   Ventricular tachycardia (paroxysmal) (HCC)   Acute kidney injury superimposed on CKD [facility])   Palliative care encounter   Discharge Condition: Stable and improved.  Patient discharged home with instruction to follow-up with PCP and cardiology service as an outpatient.  Diet recommendation: Heart healthy and modify carbohydrates  Filed Weights   06/06/18 1434 06/08/18 0536  Weight: 86.2 kg 90.8 kg    Brief History of present illness:  As per H&P written by Dr. Alcario Drought on 06/06/2018 78 y.o.malewith medical history significant ofDM2, HTN, CKD stage 3, CAD s/p CABG, CHF with EF 20%, AICD placement, paroxysmal VTach including V tach arrests in March and then again a mere 9 days ago requiring CPR, intubation, and admission to [facility]. Ultimately it seems recurrent VTach was able to be managed with amiodarone.  While there he was treated with zosyn for aspiration PNA. Ultimately discharged on amoxicillin on 5/25.  Patient discharged home with home hospice though he wishes to remain a full code.  Patient  presents to the ED with c/o progressively worsening SOB, productive cough. Now unable to get out of bed due to generalized weakness.  Hospital Course:  1-sepsis secondary to HCAP (healthcare-associated pneumonia); acute on chronic resp failure: -Patient met sepsis criteria on admission (elevated WBCs, increased respiratory rate, increased oxygen needs, low-grade temperature and findings on x-ray-suggested new infiltrates). -Excellent response to IV antibiotics; sepsis features resolved at time of discharge -Patient will go home with Augmentin by mouth (renally adjusted) and instructions to complete 6 more days of treatment. -Follow culture results; so far no growth -Advised to maintain adequate hydration -Will continue Mucinex and resume home inhalers medications -Patient with excellent oxygen saturation while using 2-2.5 L nasal cannula supplementation.  2-Automatic implantable cardioverter-defibrillator in situ -Appears to be working as intended. -Discussed with patient there has not been any symptoms of ICD firing. -Continue outpatient follow-up with electrophysiologist  3-Chronic systolic CHF (congestive heart failure) ([place]) -Currently compensated and stable -Continue the use of Coreg, adjusted dose of losartan, Lasix and spironolactone. -Follow daily weights, and low-sodium diet -Most recent echocardiogram with ejection fraction 20% -Status post AICD placement -Continue outpatient follow-up with electrophysiologist and cardiologist.  4-Atrial fibrillation ([place]) -Rate controlled -Continue amiodarone and beta-blocker -Continue Eliquis for secondary prevention. -Patient denies palpitations.  5-Diabetic polyneuropathy associated with type 2 diabetes mellitus ([place]) -Resume home hypoglycemic regimen -Advised to follow modified carbohydrate diet. -Continue gabapentin  6-Essential hypertension -Blood pressure is stable and appropriately rising at discharge. -Continue Home  medications and vital signs fluctuation to further adjust antihypertensive regimen as needed. -Patient instructed to follow low-sodium diet.  7-Acute kidney injury superimposed on CKD ([place]) -Patient with stage III at baseline -Most likely associated with decreased perfusion in the  setting of sepsis, dehydration and continue use of nephrotoxic agents (losartan and diuretics).  -After fluid resuscitation and holding nephrotoxic medications creatinine down to 1.4 and stable -Patient sepsis features resolved -Safe to resume home medications for his heart failure at adjusted dose. -Repeat basic metabolic panel follow-up visit to reassess renal function stability.   -Patient advised to maintain adequate hydration.  Procedures:  See below for x-ray reports.  Consultations:  None  Discharge Exam: Vitals:   06/09/18 0820 06/09/18 0929  BP:  129/63  Pulse:  (!) 34  Resp:  14  Temp:    SpO2: 94% 96%    General: Afebrile, reporting significant improvement in his breathing.  Denies any chest pain or palpitations.  Using 2-2.5 L/cannula supplementation.  No nausea no vomiting. Cardiovascular: Rate control: No rubs, no gallops, no JVD on exam. Respiratory: Positive scattered rhonchi right, no wheezing or crackles; no using accessory muscles.  Improved air movement bilaterally. Abdomen: Soft, nontender, nondistended, positive bowel sounds Extremities: No edema, no cyanosis, no clubbing.  Discharge Instructions   Discharge Instructions    (HEART FAILURE PATIENTS) Call MD:  Anytime you have any of the following symptoms: 1) 3 pound weight gain in 24 hours or 5 pounds in 1 week 2) shortness of breath, with or without a dry hacking cough 3) swelling in the hands, feet or stomach 4) if you have to sleep on extra pillows at night in order to breathe.   Complete by:  As directed    Diet - low sodium heart healthy   Complete by:  As directed    Discharge instructions   Complete by:  As directed     Take medications as prescribed Maintain adequate hydration Check your weight on daily basis Follow low-sodium diet (less than 2 g of sodium daily) Arrange follow-up with PCP in 10 days   Increase activity slowly   Complete by:  As directed      Allergies as of 06/09/2018      Reactions   Adhesive [tape] Itching, Rash   Testosterone patch adhesive Please use paper tape      Medication List    TAKE these medications   allopurinol 100 MG tablet Commonly known as:  ZYLOPRIM Take 100 mg by mouth daily.   aluminum-magnesium hydroxide-simethicone 354-562-56 MG/5ML Susp Commonly known as:  MAALOX Take 30 mLs by mouth daily.   amiodarone 200 MG tablet Commonly known as:  PACERONE Take 1 tablet (200 mg total) by mouth daily. What changed:  when to take this   amoxicillin-clavulanate 500-125 MG tablet Commonly known as:  Augmentin Take 1 tablet (500 mg total) by mouth 2 (two) times a day for 6 days.   apixaban 5 MG Tabs tablet Commonly known as:  ELIQUIS Take 1 tablet (5 mg total) by mouth 2 (two) times daily.   Arnuity Ellipta 100 MCG/ACT Aepb Generic drug:  Fluticasone Furoate Inhale 1 puff into the lungs daily.   atorvastatin 80 MG tablet Commonly known as:  LIPITOR Take 1 tablet (80 mg total) by mouth daily at 6 PM.   carvedilol 6.25 MG tablet Commonly known as:  COREG Take 1 tablet (6.25 mg total) by mouth 2 (two) times daily with a meal.   cyclobenzaprine 10 MG tablet Commonly known as:  FLEXERIL Take 10 mg by mouth 2 (two) times daily.   dutasteride 0.5 MG capsule Commonly known as:  AVODART Take 0.5 mg by mouth at bedtime.   famotidine 20 MG tablet Commonly known as:  PEPCID Take 20 mg by mouth daily.   FISH OIL PO Take 2 tablets by mouth 2 (two) times daily.   furosemide 40 MG tablet Commonly known as:  LASIX Take 1.5 tablets (60 mg total) by mouth 2 (two) times daily. What changed:    medication strength  how much to take   gabapentin 300 MG  capsule Commonly known as:  NEURONTIN Take 300 mg by mouth 2 (two) times daily.   guaiFENesin-codeine 100-10 MG/5ML syrup Take 10 mLs by mouth every 4 (four) hours as needed for cough.   insulin aspart 100 UNIT/ML FlexPen Commonly known as:  NovoLOG FlexPen inject 3 units if sugar is >151 ac meal; inject 2 units if sugar is btween 110 & 150, Do not inject any insulin  if your sugar is < 110 What changed:    how much to take  how to take this  when to take this  additional instructions   insulin glargine 100 unit/mL Sopn Commonly known as:  LANTUS Inject 0.1 mLs (10 Units total) into the skin daily. What changed:  when to take this   losartan 50 MG tablet Commonly known as:  COZAAR Take 0.5 tablets (25 mg total) by mouth daily. What changed:  how much to take   magnesium oxide 400 MG tablet Commonly known as:  MAG-OX Take 400 mg by mouth 2 (two) times a day.   Nitrostat 0.4 MG SL tablet Generic drug:  nitroGLYCERIN Place 0.4 mg under the tongue every 5 (five) minutes as needed for chest pain (MAX 3 TABLETS).   NON FORMULARY Needles, syringes with needles, glucose reagent test strips   potassium chloride SA 20 MEQ tablet Commonly known as:  K-DUR Take 20 mEq by mouth daily.   PROBIOTIC ACIDOPHILUS PO Take 1 capsule by mouth 2 (two) times daily.   SAW PALMETTO PO Take 1 tablet by mouth daily.   sertraline 100 MG tablet Commonly known as:  ZOLOFT Take 50 mg by mouth daily.   spironolactone 25 MG tablet Commonly known as:  ALDACTONE Take 1 tablet (25 mg total) by mouth daily.   Ventolin HFA 108 (90 Base) MCG/ACT inhaler Generic drug:  albuterol Inhale 2 puffs into the lungs every 4 (four) hours as needed for wheezing or shortness of breath.   vitamin C 1000 MG tablet Take 1,000 mg by mouth daily.   Vitamin D3 50 MCG (2000 UT) Tabs Take 2,000 Units by mouth 2 (two) times daily.   vitamin E 400 UNIT capsule Take 400 Units by mouth daily.       Allergies  Allergen Reactions  . Adhesive [Tape] Itching and Rash    Testosterone patch adhesive Please use paper tape   Follow-up Information    Moon, Amy A, NP. Schedule an appointment as soon as possible for a visit in 10 day(s).   Specialty:  Internal Medicine Contact information: [place] [address] [phone]        [place]ique, Peter M, MD .   Specialty:  Cardiology Contact information: [place] [address] [phone]        Thompson Grayer, MD .   Specialty:  Cardiology Contact information: [place] [address] [phone]        [name], Shaune Pascal, MD .   Specialty:  Cardiology Contact information: [address] [place] [address] [phone]  The results of significant diagnostics from this hospitalization (including imaging, microbiology, ancillary and laboratory) are listed below for reference.    Significant Diagnostic Studies: Dg Chest Port 1 View  Result Date: 06/06/2018 CLINICAL DATA:  Cough and short of breath EXAM: PORTABLE CHEST 1 VIEW COMPARISON:  05/30/2018 FINDINGS: Postop CABG. AICD. Nerve stimulator on the right extending into the neck is unchanged Cardiac enlargement without heart failure or edema. Mild atelectasis in the lung bases. No pleural effusion. IMPRESSION: Mild bibasilar atelectasis. Negative for heart failure or pneumonia. Electronically Signed   By: Franchot Gallo M.D.   On: 06/06/2018 15:19    Microbiology: Recent Results (from the past 240 hour(s))  Blood Culture (routine x 2)     Status: None (Preliminary result)   Collection Time: 06/06/18  2:32 PM  Result Value Ref Range Status   Specimen Description BLOOD BLOOD RIGHT HAND  Final   Special Requests   Final    BOTTLES DRAWN AEROBIC AND ANAEROBIC Blood Culture results may not be optimal due to an inadequate volume of blood received in culture bottles    Culture   Final    NO GROWTH 3 DAYS Performed at [facility], [place] [address] [place], [place] [address]    Report Status PENDING  Incomplete  Blood Culture (routine x 2)     Status: None (Preliminary result)   Collection Time: 06/06/18  2:37 PM  Result Value Ref Range Status   Specimen Description BLOOD RIGHT ANTECUBITAL  Final   Special Requests   Final    BOTTLES DRAWN AEROBIC AND ANAEROBIC Blood Culture adequate volume   Culture   Final    NO GROWTH 3 DAYS Performed at [facility], [place] [address] [place], [place] [address]    Report Status PENDING  Incomplete  Urine culture     Status: Abnormal   Collection Time: 06/06/18  3:04 PM  Result Value Ref Range Status   Specimen Description URINE, CATHETERIZED  Final   Special Requests NONE  Final   Culture (A)  Final    <10,000 COLONIES/mL INSIGNIFICANT GROWTH Performed at [facility], [place] [address], [place], [place] [address]    Report Status 06/07/2018 FINAL  Final  SARS Coronavirus 2 (CEPHEID- Performed in [facility] lab), Hosp Order     Status: None   Collection Time: 06/06/18  4:06 PM  Result Value Ref Range Status   SARS Coronavirus 2 NEGATIVE NEGATIVE Final    Comment: (NOTE) If result is NEGATIVE SARS-CoV-2 target nucleic acids are NOT DETECTED. The SARS-CoV-2 RNA is generally detectable in upper and lower  respiratory specimens during the acute phase of infection. The lowest  concentration of SARS-CoV-2 viral copies this assay can detect is 250  copies / mL. A negative result does not preclude SARS-CoV-2 infection  and should not be used as the sole basis for treatment or other  patient management decisions.  A negative result may occur with  improper specimen collection / handling, submission of specimen other  than nasopharyngeal swab, presence of viral mutation(s) within the  areas targeted by this assay, and inadequate number of viral copies  (<250 copies / mL). A negative  result must be combined with clinical  observations, patient history, and epidemiological information. If result is POSITIVE SARS-CoV-2 target nucleic acids are DETECTED. The SARS-CoV-2 RNA is generally detectable in upper and lower  respiratory specimens dur ing the acute phase of infection.  Positive  results are  indicative of active infection with SARS-CoV-2.  Clinical  correlation with patient history and other diagnostic information is  necessary to determine patient infection status.  Positive results do  not rule out bacterial infection or co-infection with other viruses. If result is PRESUMPTIVE POSTIVE SARS-CoV-2 nucleic acids MAY BE PRESENT.   A presumptive positive result was obtained on the submitted specimen  and confirmed on repeat testing.  While 2019 novel coronavirus  (SARS-CoV-2) nucleic acids may be present in the submitted sample  additional confirmatory testing may be necessary for epidemiological  and / or clinical management purposes  to differentiate between  SARS-CoV-2 and other Sarbecovirus currently known to infect humans.  If clinically indicated additional testing with an alternate test  methodology [phone]) is advised. The SARS-CoV-2 RNA is generally  detectable in upper and lower respiratory sp ecimens during the acute  phase of infection. The expected result is Negative. Fact Sheet for Patients:  StrictlyIdeas.no Fact Sheet for Healthcare Providers: BankingDealers.co.za This test is not yet approved or cleared by the [place] FDA and has been authorized for detection and/or diagnosis of SARS-CoV-2 by FDA under an Emergency Use Authorization (EUA).  This EUA will remain in effect (meaning this test can be used) for the duration of the COVID-19 declaration under Section 564(b)(1) of the Act, 21 U.S.C. section 360bbb-3(b)(1), unless the authorization is terminated or revoked sooner. Performed at [facility], [place] [address], [place], [place] [address]   MRSA PCR Screening     Status: None   Collection Time: 06/07/18  5:12 AM  Result Value Ref Range Status   MRSA by PCR NEGATIVE NEGATIVE Final    Comment:        The GeneXpert MRSA Assay (FDA approved for NASAL specimens only), is one component of a comprehensive MRSA colonization surveillance program. It is not intended to diagnose MRSA infection nor to guide or monitor treatment for MRSA infections. Performed at [facility], [place] [address], [place], [place] [address]   Culture, sputum-assessment     Status: None   Collection Time: 06/07/18  6:55 AM  Result Value Ref Range Status   Specimen Description SPUTUM  Final   Special Requests NONE  Final   Sputum evaluation   Final    THIS SPECIMEN IS ACCEPTABLE FOR SPUTUM CULTURE Performed at [facility], [address] [place], [place] [address]    Report Status 06/07/2018 FINAL  Final  Culture, respiratory     Status: None (Preliminary result)   Collection Time: 06/07/18  6:55 AM  Result Value Ref Range Status   Specimen Description SPUTUM  Final   Special Requests NONE Reflexed from [id number]  Final   Gram Stain   Final    ABUNDANT WBC PRESENT, PREDOMINANTLY PMN FEW GRAM POSITIVE RODS    Culture   Final    FEW YEAST IDENTIFICATION TO FOLLOW Performed at [facility], [address] [place],  [address]    Report Status PENDING  Incomplete     Labs: Basic Metabolic Panel: Recent Labs  Lab 06/06/18 1504 06/07/18 0248 06/09/18 0436  NA 128* 134* 136  K 4.3 4.2 4.2  CL 89* 96* 103  CO2 '25 24 25  ' GLUCOSE 301* 188* 156*  BUN 94* 89* 44*  CREATININE 3.32* 2.96* 1.49*  CALCIUM 8.5* 8.4* 8.4*   Liver Function Tests: Recent Labs  Lab 06/06/18 1504 06/07/18 0248  AST 85* 93*  ALT 77* 78*  ALKPHOS  78 69  BILITOT 0.7 0.9  PROT 6.4* 5.8*  ALBUMIN 2.3* 2.0*   CBC: Recent Labs  Lab 06/06/18 1504 06/07/18 0248 06/09/18 0436   WBC 14.8* 15.1* 11.0*  NEUTROABS 11.6*  --   --   HGB 11.4* 10.7* 9.7*  HCT 33.3* 31.8* 29.0*  MCV 103.4* 104.3* 104.3*  PLT 216 197 203   Cardiac Enzymes: Recent Labs  Lab 06/06/18 1504  TROPONINI 0.04*   BNP: BNP (last 3 results) Recent Labs    01/09/18 0934 03/15/18 0101 06/06/18 1504  BNP 212.7* 394.0* 115.6*   CBG: Recent Labs  Lab 06/08/18 1658 06/08/18 2106 06/08/18 2108 06/08/18 2348 06/09/18 0815  GLUCAP 143* 215* 206* 194* 146*    Signed:  Barton Dubois MD.  [facility] 06/09/2018, 10:28 AM

## 2018-06-09 NOTE — Progress Notes (Signed)
Patient ID: Nathaniel Bolton, male   DOB: Jun 05, 1940, 78 y.o.   MRN: [id number]  This NP visited patient at the bedside as a follow up for palliative medicine needs and emotional support.  Patient is out of bed to the chair, alert and oriented and engages with me on conversation regarding his current medical situation.  Continued conversation regarding diagnosis, prognosis, goals of care, end-of-life wishes, disposition and options.  Patient is aware of the seriousness of his situation, however he remains hopeful for more continued quality of life.  He verbalizes his love of his family specific his grandchildren.  Again ultimate goal is continued quality of life, specifically at home.  Patient is open to continued hospice services in his home on discharge.  We had a detailed conversation regarding advanced directives specific to CODE STATUS, deactivation of his IACD and rehospitalization.  Plan of care:  -DNR/DNI-documented today -Deactivate AICD prior to disposition -When discharged home, hospice services -Avoid rehospitalization  I placed a call to the patient's wife and had conversation relating to the above topics and she supports her husband's decisions regarding his disposition plan.  Patient's wife tells me she worked for hospice in the past and they are a "wonderful organization".  Discussed with patient the importance of continued conversation with his  family and the  medical providers regarding overall plan of care and treatment options,  ensuring decisions are within the context of the patients values and GOCs.  Questions and concerns addressed   Discussed with Dr Dyann Kief and bedside RN/Erin  Total time spent on the unit was 35 minutes  Greater than 50% of the time was spent in counseling and coordination of care  Wadie Lessen NP  Palliative Medicine Team Team Phone # [phone] Pager [phone]

## 2018-06-10 ENCOUNTER — Other Ambulatory Visit: Payer: Self-pay

## 2018-06-10 NOTE — Patient Outreach (Signed)
Case closure:  Patient discharged from inpatient admission. Discharged home with hospice.  PLAN: close case as patient is active with hospice. Will send case closure letter to MD.  Tomasa Rand, RN, BSN, CEN [place] Coordinator [phone]

## 2018-06-11 ENCOUNTER — Encounter (HOSPITAL_COMMUNITY): Payer: Medicare HMO | Admitting: Internal Medicine

## 2018-06-11 ENCOUNTER — Ambulatory Visit (INDEPENDENT_AMBULATORY_CARE_PROVIDER_SITE_OTHER): Payer: No Typology Code available for payment source | Admitting: *Deleted

## 2018-06-11 DIAGNOSIS — I472 Ventricular tachycardia, unspecified: Secondary | ICD-10-CM

## 2018-06-11 LAB — CULTURE, BLOOD (ROUTINE X 2)
Culture: NO GROWTH
Culture: NO GROWTH
Special Requests: ADEQUATE

## 2018-06-11 NOTE — Progress Notes (Signed)
No ICM remote transmission received for 06/10/2018 and next ICM transmission scheduled for 06/23/2018.

## 2018-06-12 ENCOUNTER — Ambulatory Visit: Payer: Self-pay

## 2018-06-12 ENCOUNTER — Telehealth: Payer: Self-pay

## 2018-06-12 LAB — CUP PACEART REMOTE DEVICE CHECK
Battery Remaining Longevity: 107 mo
Battery Remaining Longevity: 105 mo
Battery Remaining Longevity: 105 mo
Battery Remaining Longevity: 105 mo
Battery Voltage: 2.97 V
Battery Voltage: 2.98 V
Battery Voltage: 2.98 V
Battery Voltage: 2.98 V
Brady Statistic RV Percent Paced: 2.02 %
Brady Statistic RV Percent Paced: 7.27 %
Brady Statistic RV Percent Paced: 7.27 %
Brady Statistic RV Percent Paced: 7.27 %
Date Time Interrogation Session: 20200529161340
Date Time Interrogation Session: 20200604122221
Date Time Interrogation Session: 20200604122732
Date Time Interrogation Session: 20200604134629
HighPow Impedance: 44 Ohm
HighPow Impedance: 59 Ohm
HighPow Impedance: 37 Ohm
HighPow Impedance: 37 Ohm
HighPow Impedance: 37 Ohm
HighPow Impedance: 49 Ohm
HighPow Impedance: 49 Ohm
HighPow Impedance: 49 Ohm
Implantable Lead Implant Date: 20091217
Implantable Lead Implant Date: 20091217
Implantable Lead Implant Date: 20091217
Implantable Lead Implant Date: 20091217
Implantable Lead Location: 753860
Implantable Lead Location: 753860
Implantable Lead Location: 753860
Implantable Lead Location: 753860
Implantable Lead Model: 6947
Implantable Lead Model: 6947
Implantable Lead Model: 6947
Implantable Lead Model: 6947
Implantable Pulse Generator Implant Date: 20180823
Implantable Pulse Generator Implant Date: 20180823
Implantable Pulse Generator Implant Date: 20180823
Implantable Pulse Generator Implant Date: 20180823
Lead Channel Impedance Value: 247 Ohm
Lead Channel Impedance Value: 304 Ohm
Lead Channel Impedance Value: 228 Ohm
Lead Channel Impedance Value: 228 Ohm
Lead Channel Impedance Value: 228 Ohm
Lead Channel Impedance Value: 304 Ohm
Lead Channel Impedance Value: 304 Ohm
Lead Channel Impedance Value: 304 Ohm
Lead Channel Pacing Threshold Amplitude: 1.125 V
Lead Channel Pacing Threshold Amplitude: 1.25 V
Lead Channel Pacing Threshold Amplitude: 1.25 V
Lead Channel Pacing Threshold Amplitude: 1.25 V
Lead Channel Pacing Threshold Pulse Width: 0.4 ms
Lead Channel Pacing Threshold Pulse Width: 0.4 ms
Lead Channel Pacing Threshold Pulse Width: 0.4 ms
Lead Channel Pacing Threshold Pulse Width: 0.4 ms
Lead Channel Sensing Intrinsic Amplitude: 7 mV
Lead Channel Sensing Intrinsic Amplitude: 7 mV
Lead Channel Sensing Intrinsic Amplitude: 4.875 mV
Lead Channel Sensing Intrinsic Amplitude: 4.875 mV
Lead Channel Sensing Intrinsic Amplitude: 4.875 mV
Lead Channel Setting Pacing Amplitude: 3 V
Lead Channel Setting Pacing Amplitude: 2.5 V
Lead Channel Setting Pacing Amplitude: 2.5 V
Lead Channel Setting Pacing Amplitude: 2.5 V
Lead Channel Setting Pacing Pulse Width: 0.4 ms
Lead Channel Setting Pacing Pulse Width: 0.4 ms
Lead Channel Setting Pacing Pulse Width: 0.4 ms
Lead Channel Setting Pacing Pulse Width: 0.4 ms
Lead Channel Setting Sensing Sensitivity: 0.3 mV
Lead Channel Setting Sensing Sensitivity: 0.3 mV
Lead Channel Setting Sensing Sensitivity: 0.3 mV
Lead Channel Setting Sensing Sensitivity: 0.3 mV

## 2018-06-12 NOTE — Telephone Encounter (Signed)
Left message for patient to remind of missed remote transmission.  

## 2018-06-18 ENCOUNTER — Telehealth: Payer: Self-pay

## 2018-06-18 NOTE — Telephone Encounter (Signed)
Returned wifes call as requested by voice mail message.  She asked about her monitor and was not calling about patient's device.  She has a 14 day monitor that was placed by Dr Doug Sou office.  Advised she will need to contact that office regarding the monitor and device clinic monitor implanted devices only.  She verbalized understanding.

## 2018-06-20 ENCOUNTER — Encounter: Payer: Self-pay | Admitting: Cardiology

## 2018-06-20 NOTE — Progress Notes (Signed)
Remote ICD transmission.   

## 2018-07-21 ENCOUNTER — Ambulatory Visit (INDEPENDENT_AMBULATORY_CARE_PROVIDER_SITE_OTHER): Payer: No Typology Code available for payment source

## 2018-07-21 DIAGNOSIS — Z95810 Presence of automatic (implantable) cardiac defibrillator: Secondary | ICD-10-CM | POA: Diagnosis not present

## 2018-07-21 DIAGNOSIS — I5022 Chronic systolic (congestive) heart failure: Secondary | ICD-10-CM | POA: Diagnosis not present

## 2018-07-25 NOTE — Progress Notes (Signed)
EPIC Encounter for ICM Monitoring  Patient Name: Nathaniel Bolton is a 78 y.o. male Date: 07/25/2018 Primary Care Physican: [name], NP Primary Cardiologist:Jordan/[name] Electrophysiologist:Allred Nephrologist: [facility] 04/15/2018 Weight: 187 lbs 05/05/2018 Weight: 190 lbs   Spoke withwifeand patient is in hospice.  Hospice is adjusting his medications.   Optivol Thoracic impedancetrending below baseline  Prescribed: Furosemide40 mgTake 1.5 tablet (60 mg total) by mouth 2 (two) times daily.Potassium 10 mEqTake1tablet(10 meq total) by mouth twice day.  Labs: 06/09/2018 Creatinine 1.49, BUN 44, Potassium 4.2, Sodium 136, GFR 45-52 06/07/2018 Creatinine 2.96, BUN 89, Potassium 4.2, Sodium 134, GFR 20-23  06/06/2018 Creatinine 3.32, BUN 94, Potassium 4.3, Sodium 128, GFR 17-20  03/19/2018 Creatinine2.16, BUN47, Potassium4.3, Sodium135, YEB34-35 A complete set of results can be found in Results Review.  Recommendations:None  Follow-up plan: No further ICM monthly follow up since patient is on hospice at this time.  Copy of ICM check sent to Dr.Allred.  3 month ICM trend: 07/21/2018    1 Year ICM trend:       [name], RN 07/25/2018 9:53 AM

## 2018-09-08 ENCOUNTER — Encounter: Payer: Self-pay | Admitting: *Deleted

## 2018-09-08 DIAGNOSIS — Z006 Encounter for examination for normal comparison and control in clinical research program: Secondary | ICD-10-CM

## 2018-09-10 ENCOUNTER — Ambulatory Visit (INDEPENDENT_AMBULATORY_CARE_PROVIDER_SITE_OTHER): Admitting: *Deleted

## 2018-09-10 DIAGNOSIS — I509 Heart failure, unspecified: Secondary | ICD-10-CM | POA: Diagnosis not present

## 2018-09-10 DIAGNOSIS — I480 Paroxysmal atrial fibrillation: Secondary | ICD-10-CM

## 2018-09-11 LAB — CUP PACEART REMOTE DEVICE CHECK
Battery Remaining Longevity: 112 mo
Battery Voltage: 3 V
Brady Statistic RV Percent Paced: 1.45 %
Date Time Interrogation Session: 20200902062822
HighPow Impedance: 42 Ohm
HighPow Impedance: 57 Ohm
Implantable Lead Implant Date: 20091217
Implantable Lead Location: 753860
Implantable Lead Model: 6947
Implantable Pulse Generator Implant Date: 20180823
Lead Channel Impedance Value: 247 Ohm
Lead Channel Impedance Value: 304 Ohm
Lead Channel Pacing Threshold Amplitude: 1.25 V
Lead Channel Pacing Threshold Pulse Width: 0.4 ms
Lead Channel Sensing Intrinsic Amplitude: 6.75 mV
Lead Channel Sensing Intrinsic Amplitude: 6.75 mV
Lead Channel Setting Pacing Amplitude: 2.5 V
Lead Channel Setting Pacing Pulse Width: 0.4 ms
Lead Channel Setting Sensing Sensitivity: 0.3 mV

## 2018-09-12 NOTE — Research (Signed)
Spoke on phone with patient and patients wife. Nathaniel Bolton said he was hanging in there. No AEs to report or unresolved AE.   Covid questionnaire completed.

## 2018-09-19 ENCOUNTER — Telehealth: Payer: Self-pay

## 2018-09-19 NOTE — Telephone Encounter (Signed)
Attempted return call to wife as requested by voice mail message.  She asked to have [facility] remote transmission to check fluid levels.  Left message for return call.

## 2018-09-19 NOTE — Telephone Encounter (Addendum)
Spoke with wife, [name] per [employer].  She reported patient symptomatic with weight gain ~15 lbs in the last month, swelling of feet, legs, and abdomen.  She said patient is in hospice and hospice physician is managing his care.   Reviewed transmission and advised possible fluid accumulation since June but in the last 2 days it is trending close to baseline.  She said he is taking Furosemide 80 mg bid.   Advised her to discuss patient's symptoms with hospice so hospice physician can adjust his Furosemide if needed.   1 year ICM trend:

## 2018-09-26 NOTE — Progress Notes (Signed)
Remote ICD transmission.   

## 2018-11-09 DEATH — deceased

## 2020-05-23 IMAGING — DX DG CHEST 2V
2 series · 2 of 2 positions shown · non-contrast
Comparison: 07/02/2017

CLINICAL DATA: Shortness of breath

EXAM:
CHEST - 2 VIEW

[chest pa]
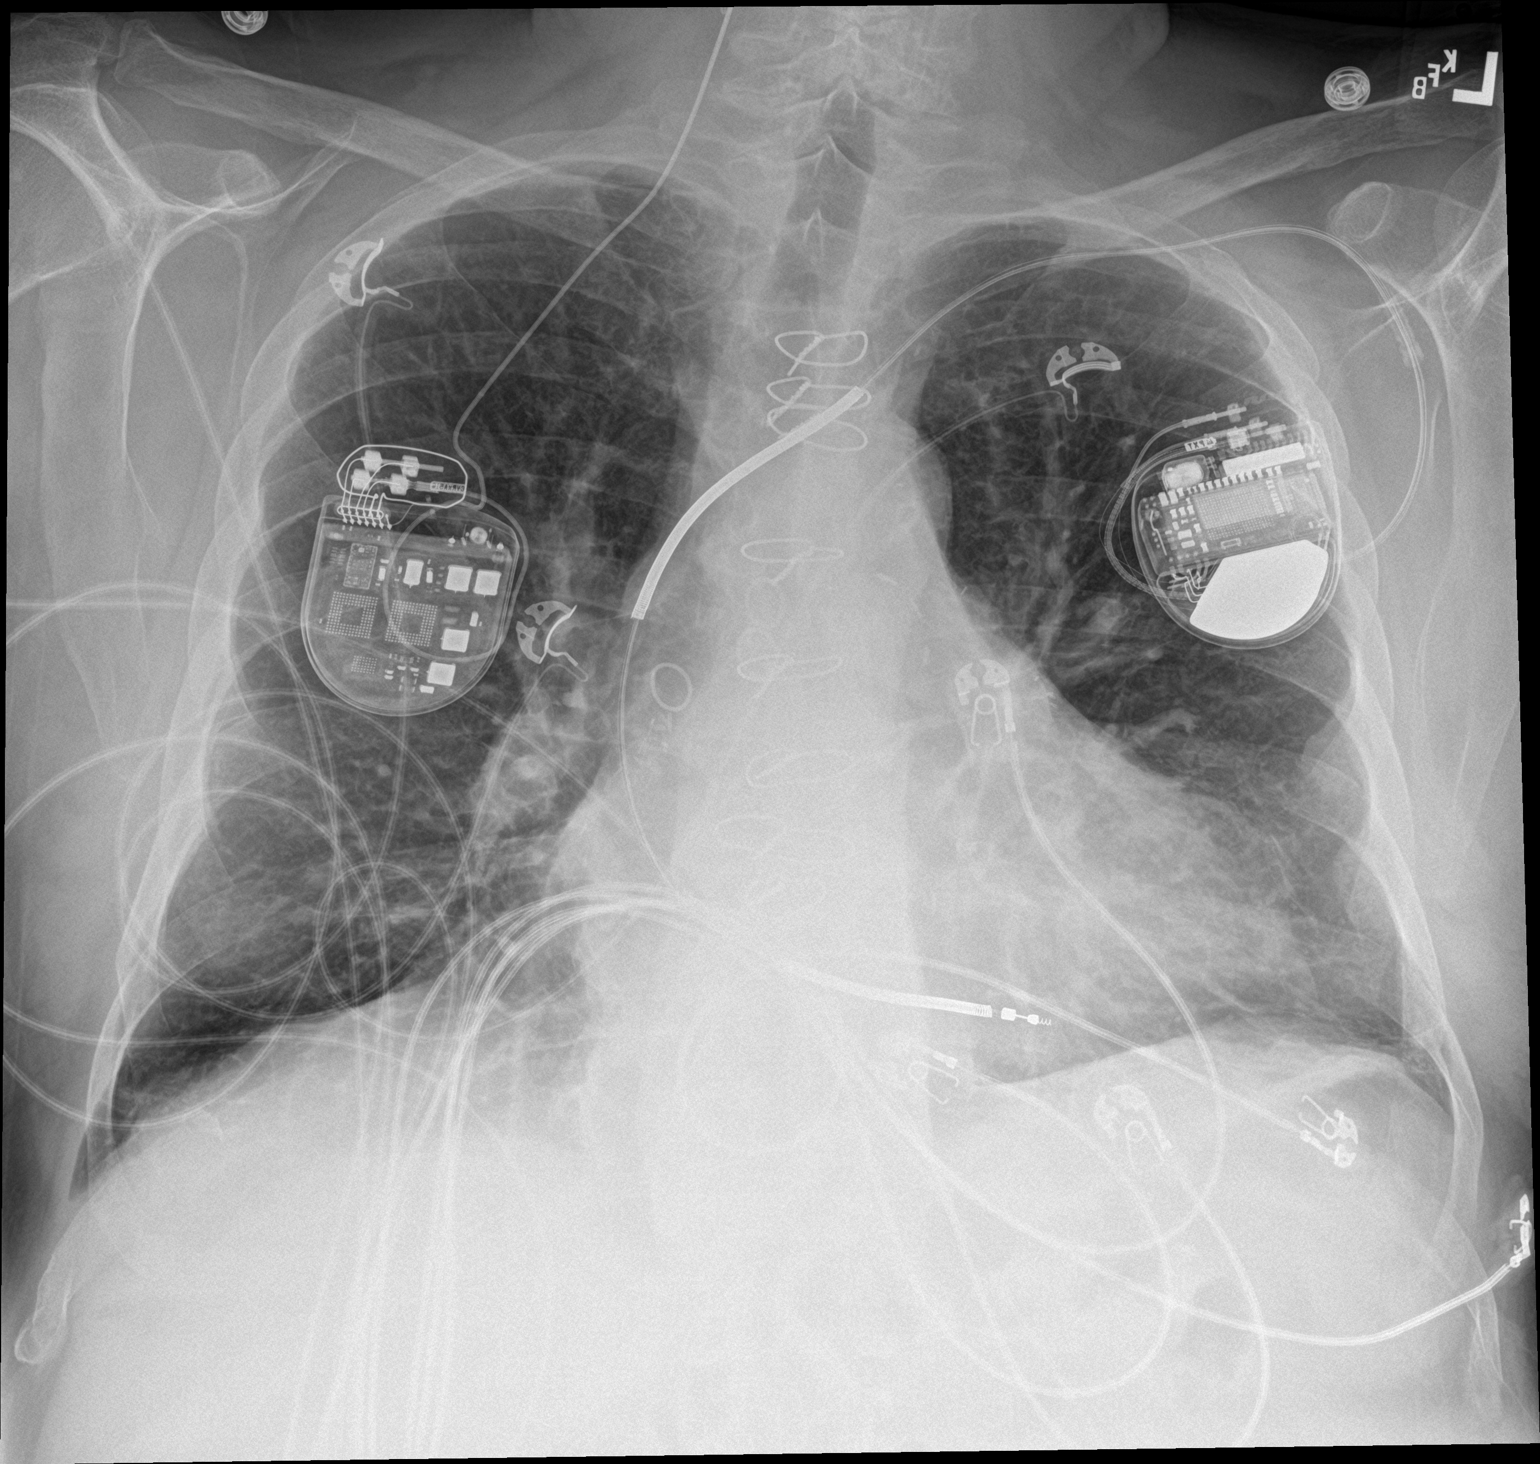

[chest lat]
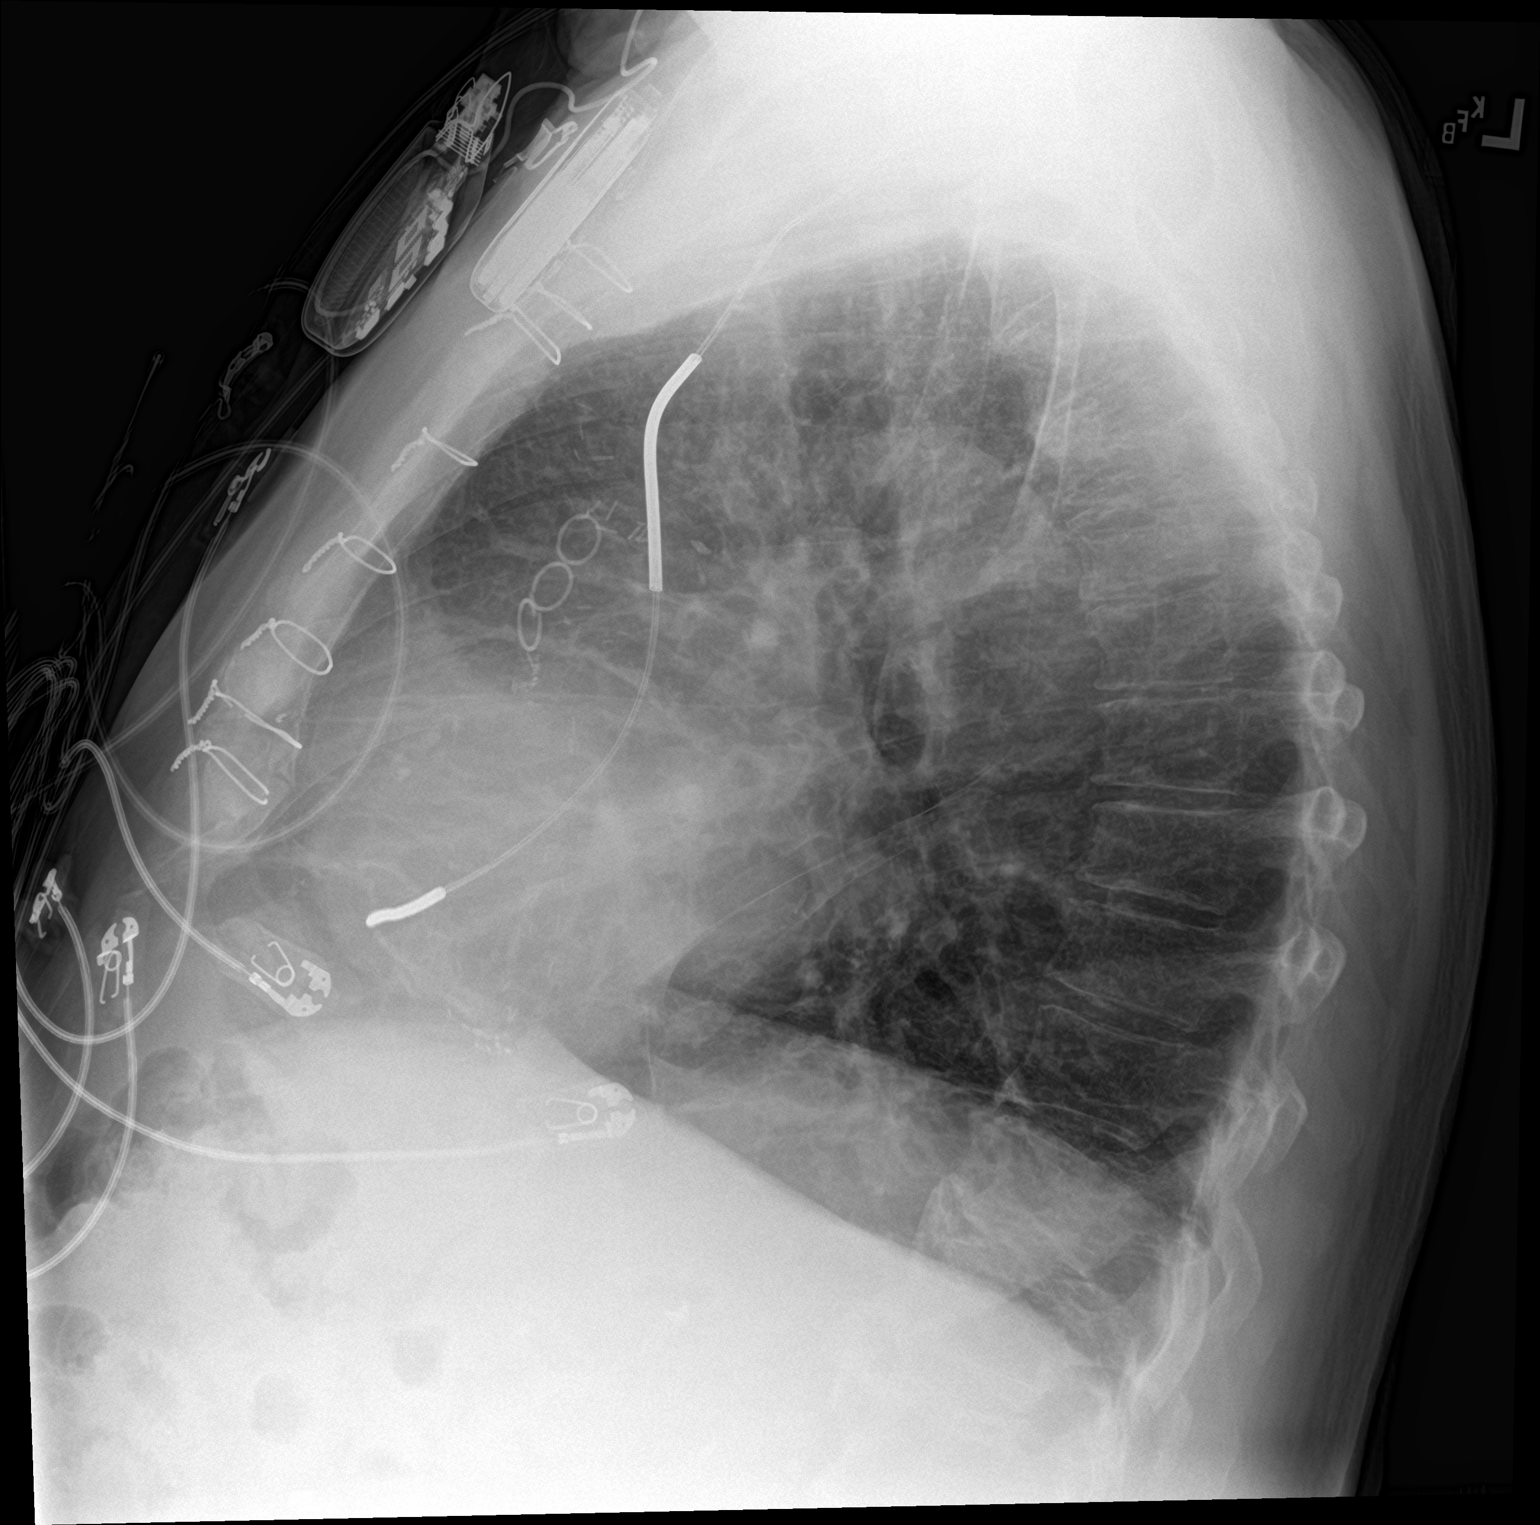

[2 of 2 positions shown; findings below may reference images not displayed]

FINDINGS: Post sternotomy changes. Left-sided pacing device as before.
Stimulator generator over the right chest. Mild cardiomegaly with
scarring or atelectasis at the lingula and left base. No pleural
effusion. Aortic atherosclerosis. No pneumothorax.
IMPRESSION: No active cardiopulmonary disease. Cardiomegaly with lingular and
left base atelectasis or scarring.

## 2020-09-21 IMAGING — DX DG CHEST 1V PORT
1 series · 1 of 1 positions shown · non-contrast
Comparison: Radiographs 03/13/2018

CLINICAL DATA: Heart failure.

EXAM:
PORTABLE CHEST 1 VIEW

[chest]
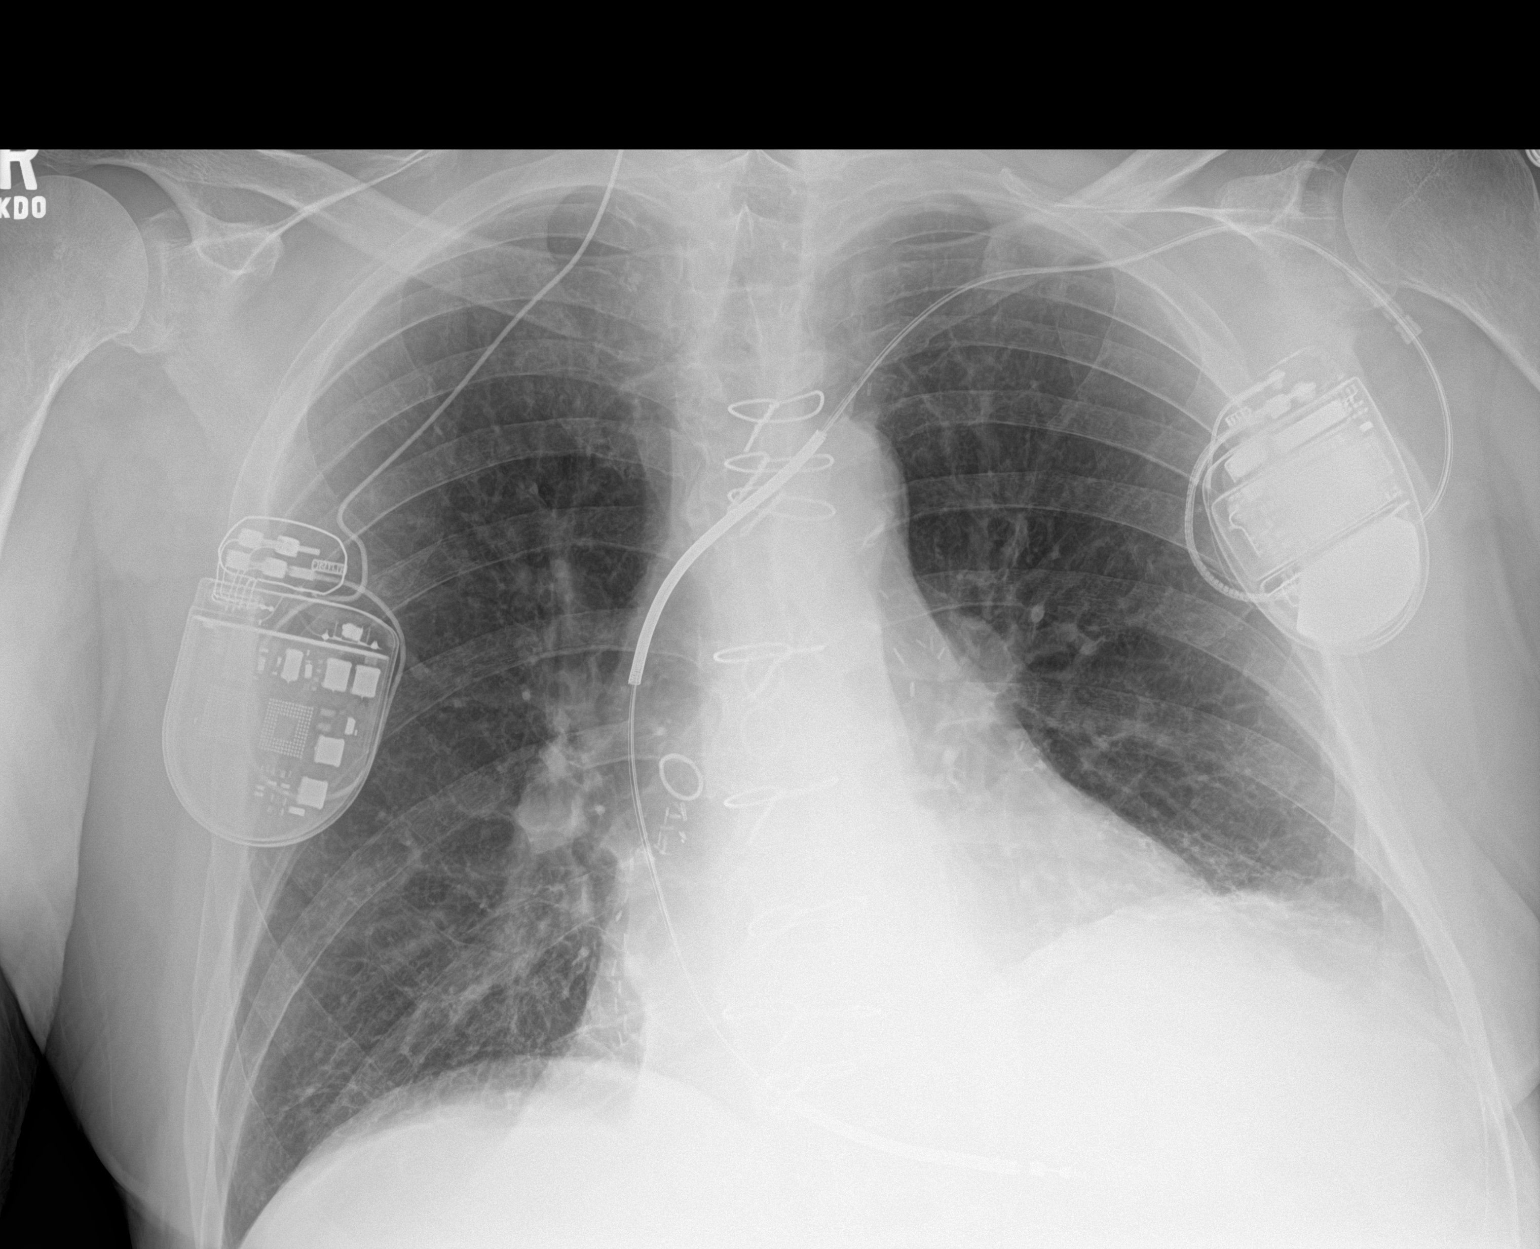

[1 of 1 positions shown; findings below may reference images not displayed]

FINDINGS: Post median sternotomy and CABG. Stable or decreased cardiomegaly
from recent exam. Left-sided pacemaker in place. Minimal septal
thickening suggesting pulmonary edema, new from prior. Bibasilar
atelectasis or scarring. No large pleural effusion or pneumothorax.
Additional battery pack with lead coursing into the neck is
partially included.
IMPRESSION: Mild septal thickening suggesting pulmonary edema. Stable or
decreased cardiomegaly from radiographs 2 days ago.

## 2020-09-23 IMAGING — DX CHEST - 2 VIEW
2 series · 2 of 2 positions shown · non-contrast
Comparison: March 15, 2018

CLINICAL DATA: Cough for 2 days.

EXAM:
CHEST - 2 VIEW

[chest lat]
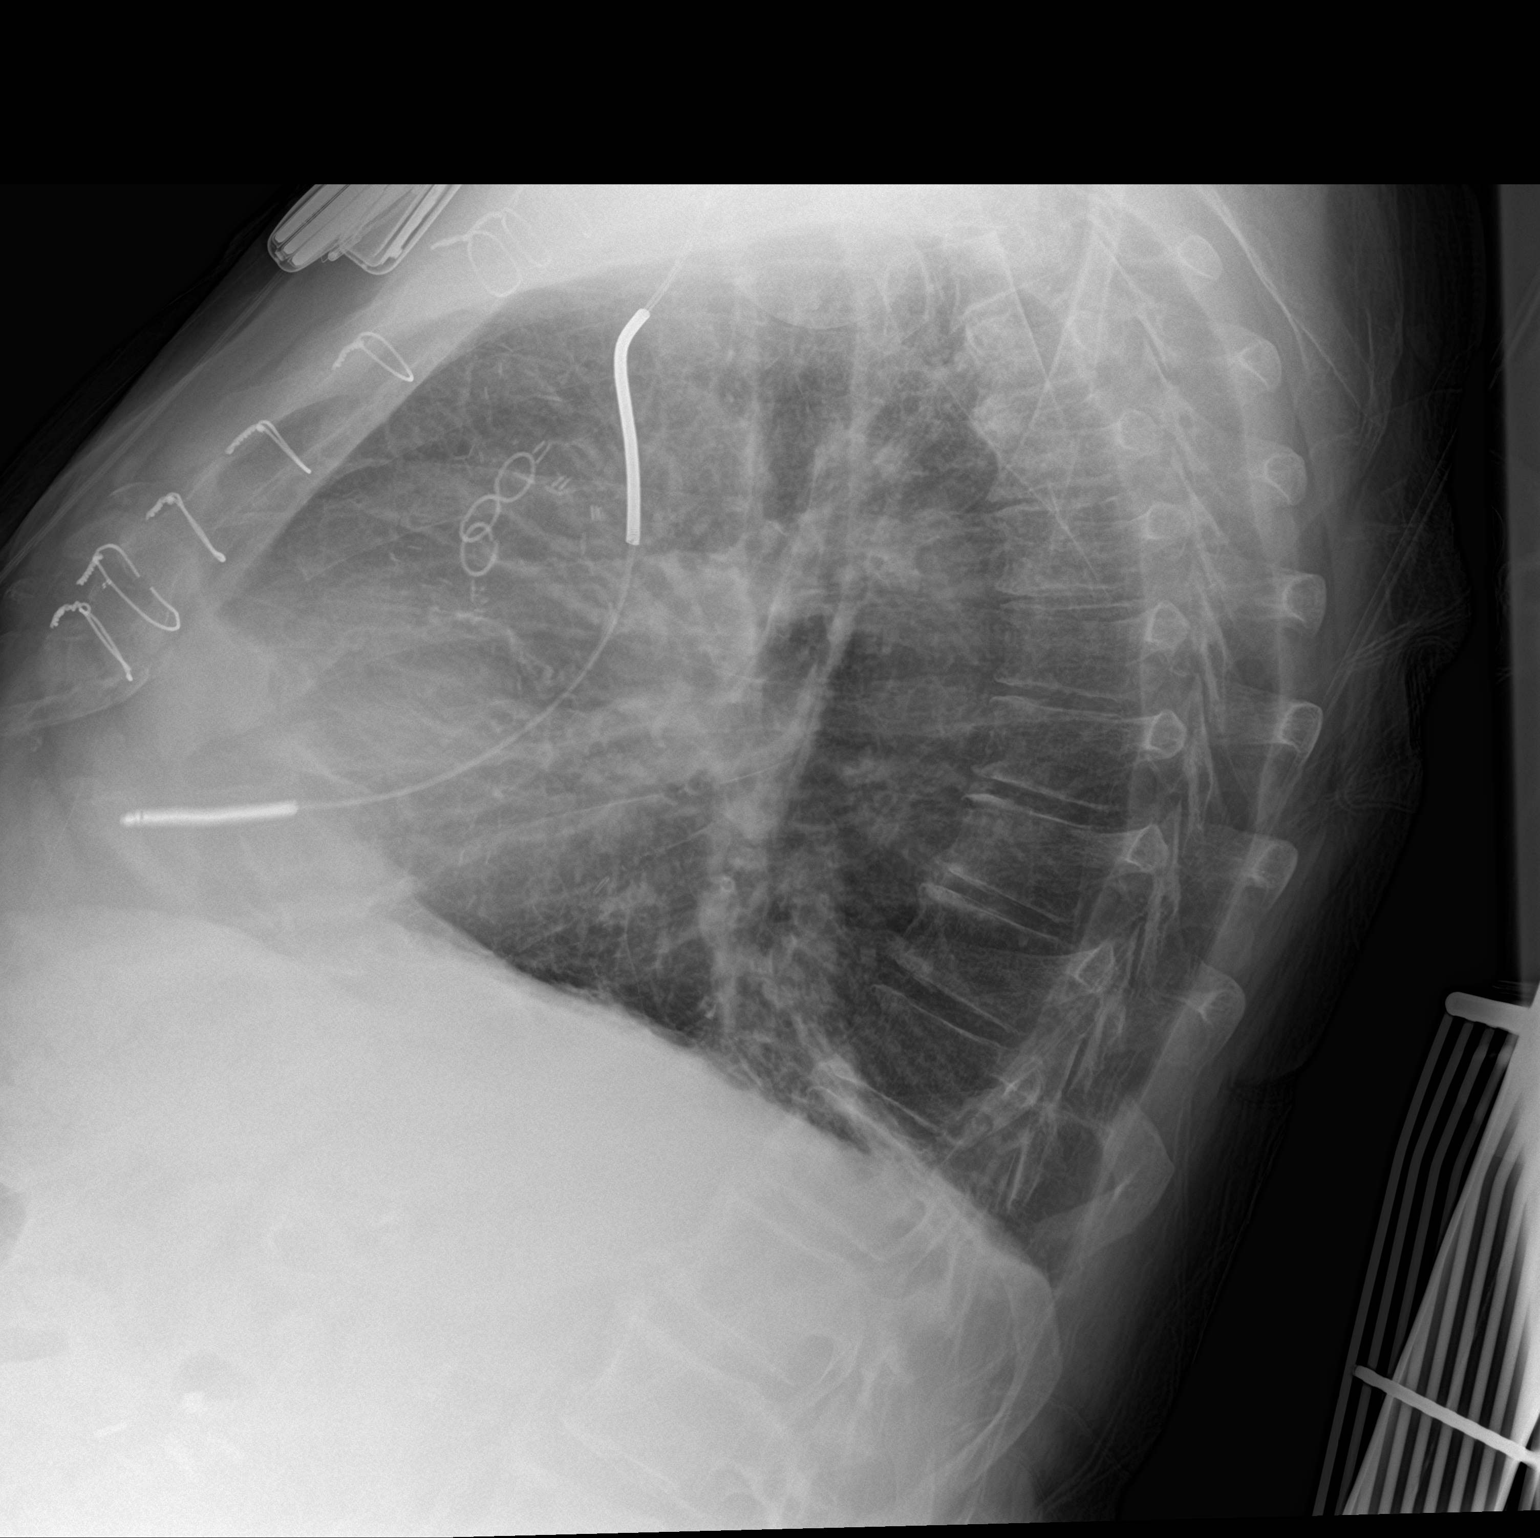

[chest ap]
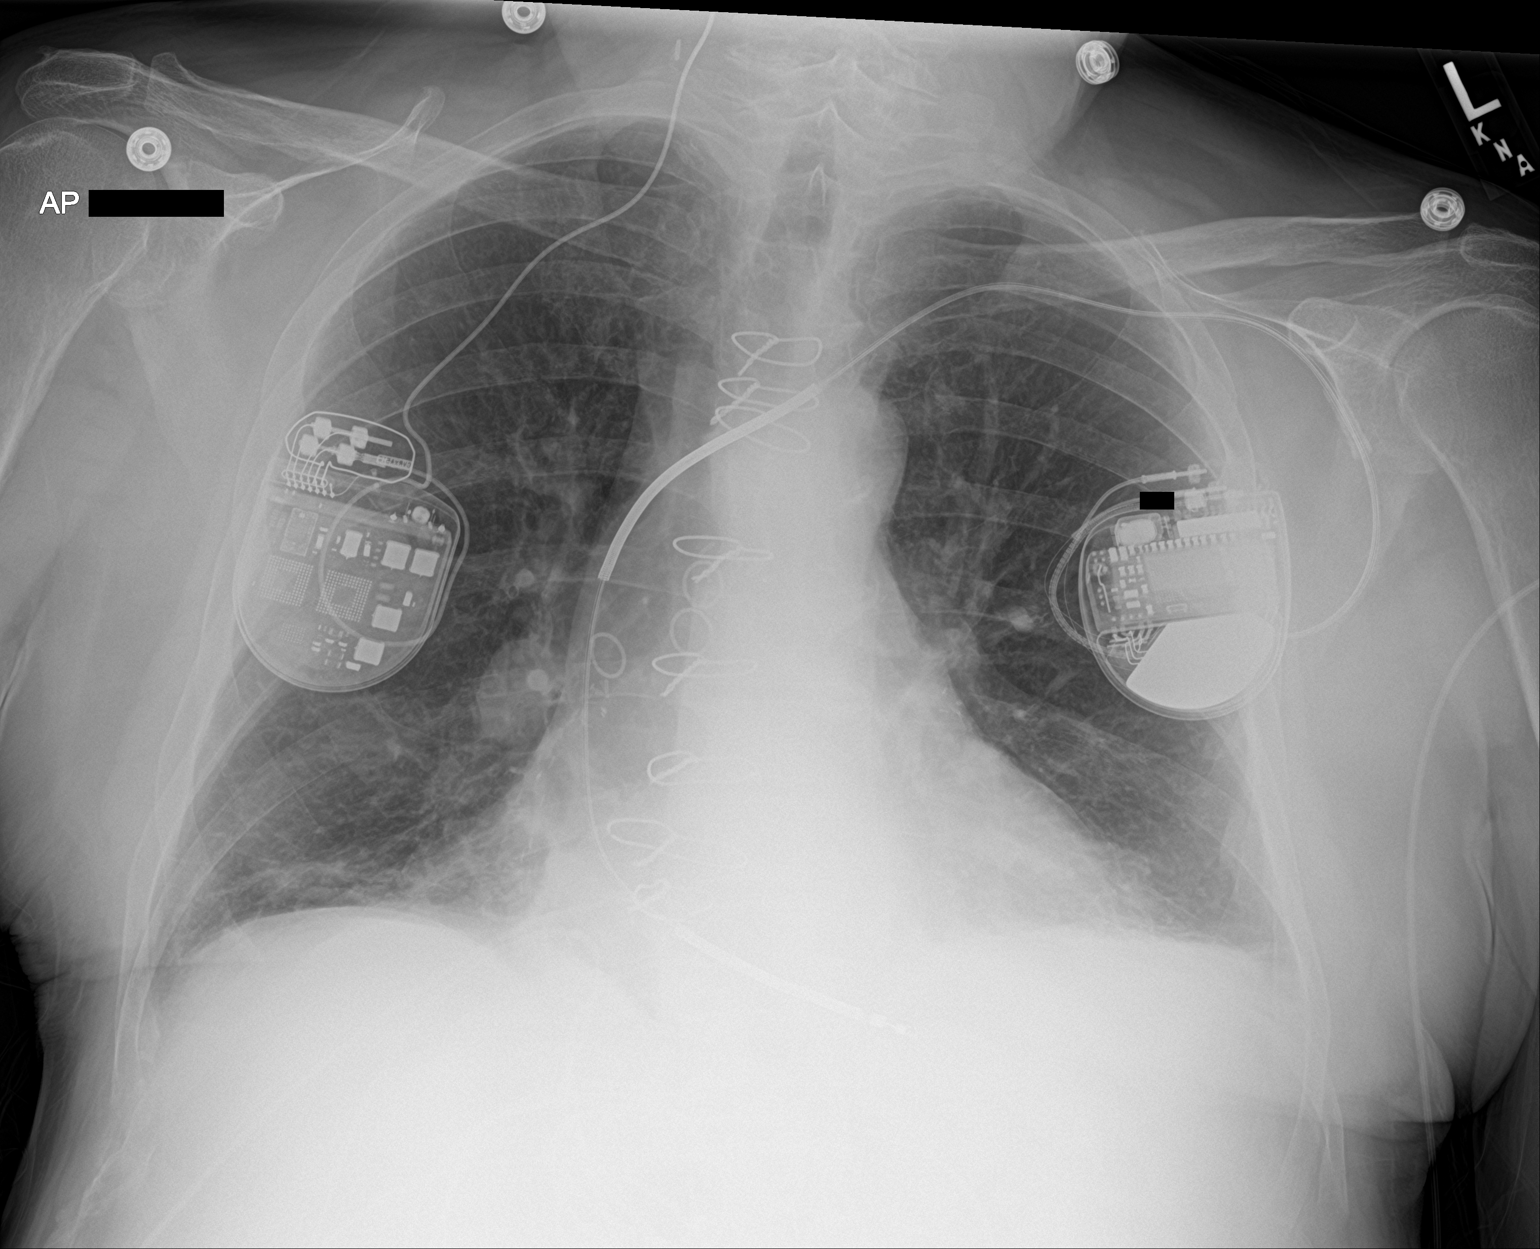

[2 of 2 positions shown; findings below may reference images not displayed]

FINDINGS: Stable AICD device. Stable generator over the right side of the
chest. Mild bibasilar opacities. No other acute abnormalities or
changes.
IMPRESSION: Stable bibasilar opacities, right greater than left, could represent
atelectasis or infiltrate. Atelectasis is favored.
